# Patient Record
Sex: Male | Born: 1960 | Race: White | Hispanic: No | Marital: Married | Smoking: Never smoker
Health system: Southern US, Community
[De-identification: ages and names within clinical notes are randomized; demographics above are authoritative.]

## PROBLEM LIST (undated history)

## (undated) DIAGNOSIS — C189 Malignant neoplasm of colon, unspecified: Secondary | ICD-10-CM

## (undated) DIAGNOSIS — E785 Hyperlipidemia, unspecified: Secondary | ICD-10-CM

## (undated) DIAGNOSIS — Z87442 Personal history of urinary calculi: Secondary | ICD-10-CM

## (undated) DIAGNOSIS — Z9229 Personal history of other drug therapy: Secondary | ICD-10-CM

## (undated) DIAGNOSIS — M1990 Unspecified osteoarthritis, unspecified site: Secondary | ICD-10-CM

## (undated) DIAGNOSIS — E119 Type 2 diabetes mellitus without complications: Secondary | ICD-10-CM

## (undated) DIAGNOSIS — IMO0001 Reserved for inherently not codable concepts without codable children: Secondary | ICD-10-CM

## (undated) DIAGNOSIS — K589 Irritable bowel syndrome without diarrhea: Secondary | ICD-10-CM

## (undated) DIAGNOSIS — I10 Essential (primary) hypertension: Secondary | ICD-10-CM

## (undated) DIAGNOSIS — I4891 Unspecified atrial fibrillation: Secondary | ICD-10-CM

## (undated) DIAGNOSIS — F419 Anxiety disorder, unspecified: Secondary | ICD-10-CM

## (undated) DIAGNOSIS — K219 Gastro-esophageal reflux disease without esophagitis: Secondary | ICD-10-CM

## (undated) HISTORY — DX: Unspecified atrial fibrillation: I48.91

## (undated) HISTORY — PX: KNEE ARTHROSCOPY: SUR90

## (undated) HISTORY — DX: Irritable bowel syndrome, unspecified: K58.9

## (undated) HISTORY — DX: Hyperlipidemia, unspecified: E78.5

## (undated) HISTORY — PX: FOOT SURGERY: SHX648

## (undated) HISTORY — PX: LITHOTRIPSY: SUR834

## (undated) HISTORY — DX: Reserved for inherently not codable concepts without codable children: IMO0001

## (undated) HISTORY — DX: Personal history of other drug therapy: Z92.29

## (undated) HISTORY — DX: Essential (primary) hypertension: I10

## (undated) HISTORY — PX: EYE SURGERY: SHX253

## (undated) HISTORY — DX: Anxiety disorder, unspecified: F41.9

## (undated) HISTORY — PX: VASECTOMY: SHX75

## (undated) SURGERY — CARDIOVERSION
Anesthesia: Monitor Anesthesia Care

---

## 1994-01-22 DIAGNOSIS — I499 Cardiac arrhythmia, unspecified: Secondary | ICD-10-CM

## 1994-01-22 HISTORY — DX: Cardiac arrhythmia, unspecified: I49.9

## 2000-08-02 ENCOUNTER — Ambulatory Visit (HOSPITAL_COMMUNITY): Admission: RE | Admit: 2000-08-02 | Discharge: 2000-08-02 | Payer: Self-pay | Admitting: Internal Medicine

## 2002-09-06 ENCOUNTER — Encounter: Payer: Self-pay | Admitting: Cardiology

## 2007-02-10 ENCOUNTER — Ambulatory Visit: Payer: Self-pay | Admitting: Cardiology

## 2007-02-14 ENCOUNTER — Ambulatory Visit: Payer: Self-pay | Admitting: Cardiology

## 2007-02-20 ENCOUNTER — Ambulatory Visit: Payer: Self-pay | Admitting: Cardiology

## 2007-02-28 ENCOUNTER — Ambulatory Visit: Payer: Self-pay | Admitting: Cardiology

## 2007-03-04 ENCOUNTER — Ambulatory Visit: Payer: Self-pay | Admitting: Cardiology

## 2007-03-12 ENCOUNTER — Ambulatory Visit: Payer: Self-pay | Admitting: Cardiology

## 2007-03-19 ENCOUNTER — Ambulatory Visit: Payer: Self-pay | Admitting: Cardiology

## 2007-03-25 ENCOUNTER — Ambulatory Visit: Payer: Self-pay | Admitting: Cardiology

## 2007-04-04 ENCOUNTER — Ambulatory Visit: Payer: Self-pay | Admitting: Cardiology

## 2007-06-03 ENCOUNTER — Ambulatory Visit: Payer: Self-pay | Admitting: Cardiology

## 2007-11-14 ENCOUNTER — Ambulatory Visit: Payer: Self-pay | Admitting: Cardiology

## 2008-01-28 ENCOUNTER — Ambulatory Visit: Payer: Self-pay | Admitting: Cardiology

## 2008-02-03 ENCOUNTER — Ambulatory Visit: Payer: Self-pay | Admitting: Cardiology

## 2008-02-13 ENCOUNTER — Ambulatory Visit: Payer: Self-pay | Admitting: Cardiology

## 2008-02-20 ENCOUNTER — Ambulatory Visit: Payer: Self-pay | Admitting: Cardiology

## 2008-03-03 ENCOUNTER — Ambulatory Visit: Payer: Self-pay | Admitting: Cardiology

## 2008-03-12 ENCOUNTER — Ambulatory Visit: Payer: Self-pay | Admitting: Cardiology

## 2008-03-19 ENCOUNTER — Ambulatory Visit: Payer: Self-pay | Admitting: Cardiology

## 2008-03-26 ENCOUNTER — Ambulatory Visit: Payer: Self-pay | Admitting: Cardiology

## 2008-04-01 ENCOUNTER — Ambulatory Visit: Payer: Self-pay | Admitting: Cardiology

## 2008-04-06 ENCOUNTER — Ambulatory Visit: Payer: Self-pay | Admitting: Cardiology

## 2008-05-21 ENCOUNTER — Ambulatory Visit: Payer: Self-pay | Admitting: Cardiology

## 2008-06-28 ENCOUNTER — Ambulatory Visit: Payer: Self-pay | Admitting: Cardiology

## 2008-07-01 ENCOUNTER — Ambulatory Visit: Payer: Self-pay | Admitting: Cardiology

## 2008-07-07 ENCOUNTER — Encounter: Payer: Self-pay | Admitting: Cardiology

## 2008-09-06 ENCOUNTER — Encounter: Payer: Self-pay | Admitting: *Deleted

## 2009-01-25 ENCOUNTER — Encounter: Payer: Self-pay | Admitting: Cardiology

## 2009-04-01 ENCOUNTER — Encounter: Payer: Self-pay | Admitting: Cardiology

## 2009-05-01 ENCOUNTER — Encounter: Payer: Self-pay | Admitting: Cardiology

## 2009-05-02 ENCOUNTER — Encounter: Payer: Self-pay | Admitting: Cardiology

## 2009-05-02 ENCOUNTER — Ambulatory Visit: Payer: Self-pay | Admitting: Cardiology

## 2009-05-03 ENCOUNTER — Encounter: Payer: Self-pay | Admitting: Cardiology

## 2009-05-04 ENCOUNTER — Encounter: Payer: Self-pay | Admitting: Cardiology

## 2009-05-05 ENCOUNTER — Encounter: Payer: Self-pay | Admitting: Cardiology

## 2009-05-12 ENCOUNTER — Ambulatory Visit (HOSPITAL_COMMUNITY): Admission: RE | Admit: 2009-05-12 | Discharge: 2009-05-12 | Payer: Self-pay | Admitting: Urology

## 2009-05-30 ENCOUNTER — Encounter: Payer: Self-pay | Admitting: Cardiology

## 2010-02-21 NOTE — Letter (Signed)
Summary: Letter/ [facility]  Letter/ [facility]   Imported By: Dorise Hiss 05/05/2009 08:34:46  _____________________________________________________________________  External Attachment:    Type:   Image     Comment:   External Document  Appended Document: Letter/ [facility] patient can be cleared for surgery. No further cardiac workup needed. The patient at very low risk for cardiac complications.  Appended Document: Letter/ [facility] Note faxed back to [facility].

## 2010-02-21 NOTE — Letter (Signed)
Summary: Appointment -missed  [facility] at [facility] [address]   [place], [place] [address]   Phone: [phone]  Fax: [phone]     May 30, 2009 MRN: [id number]     Bradley Rodriguez [address] [place], [place]  [address]     Dear Mr. NAZAR,  Our records indicate you missed your appointment on May 30, 2009                        with Dr.   Andee Lineman.   It is very important that we reach you to reschedule this appointment. We look forward to participating in your health care needs.   Please contact us at the number listed above at your earliest convenience to reschedule this appointment.   Sincerely,    Glass blower/designer

## 2010-02-21 NOTE — Letter (Signed)
Summary: External Correspondence/ [facility]  External Correspondence/ [facility]   Imported By: Dorise Hiss 05/04/2009 15:01:37  _____________________________________________________________________  External Attachment:    Type:   Image     Comment:   External Document

## 2010-02-21 NOTE — Procedures (Signed)
Summary: Holter and Event/ [facility]  Holter and Event/ [facility]   Imported By: Dorise Hiss 05/09/2009 12:20:14  _____________________________________________________________________  External Attachment:    Type:   Image     Comment:   External Document

## 2010-02-21 NOTE — Letter (Signed)
Summary: [facility] D/C DR.TERRY DANIEL  [facility] D/C DR.TERRY DANIEL   Imported By: Zachary George 05/30/2009 12:03:20  _____________________________________________________________________  External Attachment:    Type:   Image     Comment:   External Document

## 2010-02-21 NOTE — Letter (Signed)
Summary: Letter/ [facility]  [facility]/ [facility]   Imported By: Dorise Hiss 05/16/2009 10:52:17  _____________________________________________________________________  External Attachment:    Type:   Image     Comment:   External Document

## 2010-02-21 NOTE — Consult Note (Signed)
Summary: CARDIOLOGY CONSULT/ [facility]  CARDIOLOGY CONSULT/ [facility]   Imported By: Zachary George 05/30/2009 11:55:47  _____________________________________________________________________  External Attachment:    Type:   Image     Comment:   External Document

## 2010-06-06 NOTE — Assessment & Plan Note (Signed)
[facility]                          [facility] NOTE   NAME:Bradley Rodriguez, Bradley Rodriguez                        MRN:          [id number]  DATE:03/19/2008                            DOB:          1960/09/18    HISTORY OF PRESENT ILLNESS:  The patient is a 50 year old male with a  history of paroxysmal atrial fibrillation.  He has been cardioverted  several occasions in the past.  He is now back in atrial fibrillation.  He is feeling tired and fatigued with this.  He has now also poor rate  of control as he ran out of Cardizem.  He is not able to afford this.  The patient denies having any chest pain or shortness of breath.   MEDICATIONS:  1. Benicar 40 mg a day.  2. Lorazepam p.o. t.i.d.  3. Metoprolol tartrate 50 mg p.o. t.i.d.  4. Coumadin as directed.  The patient has been therapeutic for 4      weeks.  5. The patient again did not take Cardizem.   PHYSICAL EXAMINATION:  VITAL SIGNS:  Blood pressure 141/89, heart rate  119, and weight 328 pounds.  LUNGS:  Clear.  HEART:  Regular rate and rhythm.  Normal S1 and S2.  ABDOMEN:  Soft.  EXTREMITIES:  No cyanosis, clubbing, or edema.   PROBLEM LIST:  1. Atrial fibrillation.  2. Awaiting cardioversion.  3. History of anxiety.  4. History of irritable bowel syndrome.  5. History of hypertension controlled.   PLAN:  1. The patient will be set up for cardioversion in next week.  2. If the patient reverse back to atrial fibrillation, we will try to      use a pill-in-the-pocket approach in the future.  3. The patient can follow up with me next week for scheduled      cardioversion.     Learta Codding, MD,FACC     GED/MedQ  DD: 03/19/2008  DT: 03/20/2008  Job #: [id number]

## 2010-06-06 NOTE — Assessment & Plan Note (Signed)
[facility]                          [facility] NOTE   NAME:Bradley Rodriguez, Bradley Rodriguez                        MRN:          [id number]  DATE:03/04/2007                            DOB:          28-Jun-1960    [name] is seen for followup.  See my note of February 10, 2007.  He  remains in atrial fib.  He is being coumadinized.  When I saw him last,  we check his renal function, which is normal and his TSH which is  normal.  We are very reviewing his Coumadin treatment very carefully to  be sure that he is a therapeutic.  We will then proceed with  cardioversion as soon as possible.   PAST MEDICAL HISTORY:   ALLERGIES:  LISINOPRIL (COUGH) AND QUININE.   MEDICATIONS:  1. Aspirin.  2. Metoprolol 100 in the morning, 50 in the evening.  3. Benicar 40.  4. Pristiq 50.  5. Cardizem 180 b.i.d.  6. Coumadin as directed.   OTHER MEDICAL PROBLEMS:  See the list on the note of February 10, 2007.   REVIEW OF SYSTEMS:  He is feeling well.   PHYSICAL EXAM:  Blood pressure is 119/72 with a pulse of 59.  The  patient is oriented to person, time and place.  Affect is normal.  HEENT:  Reveals no xanthelasma.  He has normal extraocular motion.  There are no carotid bruits.  There is no jugular venous distention.  Lungs are clear.  Respiratory effort is not labored.  Cardiac exam reveals S1-S2.  The rhythm is irregularly irregular.  His abdomen is soft.  He has no peripheral edema.   PROBLEMS INCLUDE:  1. History of hypertension.  2. History of some anxiety, under control.  3. Hyperlipidemia.  4. Irritable bowel.  5. Uric acid kidney stone.  6. Question of Gilbert's syndrome.  7. Paroxysmal atrial fibrillation.  The patient had atrial fib in the      past.  He was given ibutilide and stayed in atrial fib. He was then      cardioverted as it was done within 24 hours and this was in 2004.      He received 150 joules using the biphasic defibrillator and he    converted to sinus.  His rhythm then returned  to atrial fib on      January 10, 2007.  He did have an upper respiratory tract      infection at that time.  His [place] score is only 1.  I did decide to      coumadinized him however prior to his cardioversion.  There is      history of and the patient probably having had an echo sometime in      the remote past.  He has no insurance and I am trying to do      everything I can to limit his expenses.  However, I am      uncomfortable proceeding with cardioversion without some echo data.      We will obtain the least expensive echo that we  can prior to the      cardioversion to be sure things are safe.     Luis Abed, MD, [facility]  Electronically Signed    JDK/MedQ  DD: 03/04/2007  DT: 03/05/2007  Job #: [id number]   cc:   Donzetta Sprung

## 2010-06-06 NOTE — Assessment & Plan Note (Signed)
[facility]                          [facility]   NAME:Bradley Rodriguez, Bradley Rodriguez                        MRN:          [id number]  DATE:01/28/2008                            DOB:          1960/06/15    HISTORY OF PRESENT ILLNESS:  Bradley Rodriguez is a very pleasant 50 year old  male with a history of paroxysmal atrial fibrillation.  The patient  noticed on January 14, 2008, that he developed recurrent atrial  fibrillation.  In reviewing his chart, it appears that he has been  approximately 3-4 times cardioverted over the last 12 years starting  back in 1996.  He is not on chronic antiarrhythmic drug therapy but Dr.  Reuel Boom did increase his AV nodal blocking agents in order to improve  rate control.  The patient states that he feels tired and fatigued.  He  is not certain whether it is related to his medications or whether being  in atrial fibrillation is contributing to this.  He is also concerned  about the risk of stroke, but I have told him that his CHADS score is  only 1 and certainly aspirin long-term is a good therapy for him.  We  had a long discussion about the various strategies and how to treat him  and keep him in normal sinus rhythm, which clearly is beneficial to him  with an improved quality of life.   MEDICATIONS:  1. Aspirin 81 mg p.o. daily.  2. Pristiq 50 mg p.o. daily.  3. Benicar 40 mg p.o. daily.  4. Lorazepam 0.5 mg one tablet p.o. t.i.d.  5. Digoxin 0.5 mg p.o. q.a.m.  6. Metoprolol tartrate 50 mg one tablet p.o. t.i.d.  7. Cardizem 120 mg p.o. daily.   PHYSICAL EXAMINATION:  VITAL SIGNS:  Blood pressure is 126/79, that is  with a large cuff, heart rate on EKG 88 beats per minute.  Weight is 327  pounds, the patient is 6 feet 8 inches.  GENERAL:  The patient is well-nourished, but in no distress.  LUNGS:  Clear.  HEART:  Regular rate and rhythm with normal S1 and S2.  No pathological  murmurs.  ABDOMEN:  Soft.  EXTREMITIES:  No edema.   PROBLEM:  1. Paroxysmal atrial fibrillation.  2. History of anxiety.  3. Irritable bowel syndrome.  4. History of hypertension, although blood pressure is well      controlled.   PLAN:  1. I had a long discussion with the patient and I feel the best      approach at this point in time is to put him back on Coumadin and      cardiovert him in 4 weeks.  At that point in time, we will continue      4 weeks of Coumadin and must initiate antiarrhythmic drug therapy      immediately.  2. If the patient has recurrence of atrial fibrillation after his      cardioversion, he will come immediately to the emergency room for      chemical cardioversion with a pill-in-the-pocket approach with 300  mg of flecainide.  It is preferable giving him chronic therapy with      flecainide as his episodes are rather infrequent.  I also will not      use the flecainide initially because he has now been several weeks      in atrial fibrillation and I do think an electrocardioversion is      the preferred strategy.  3. We also called Dr. Graciela Husbands and we had him on the speaker phone.  We      discussed the possibility of pulmonary vein isolation, but we both      agreed that this is certainly not first-line treatment and that the      patient has not shown to fail antiarrhythmic drug therapy and his      atrial fibrillation is rather infrequent.     Learta Codding, MD,FACC  Electronically Signed    GED/MedQ  DD: 01/28/2008  DT: 01/29/2008  Job #: [id number]   cc:   Donzetta Sprung

## 2010-06-06 NOTE — Assessment & Plan Note (Signed)
[facility]                          [facility]   NAME:Bradley Rodriguez, Bradley Rodriguez                        MRN:          [id number]  DATE:04/04/2007                            DOB:          21-Jul-1960    Bradley Rodriguez is seen for follow-up.  We proceeded with a cardioversion on  March 25, 2007.  He received 100 joules and remained in atrial  fibrillation, followed by 200 joules and he converted to sinus.  He has  remained in sinus.  His EKG today shows normal sinus rhythm.   He is feeling much better.  He probably feels a rare post PACB.  He is  fully active.   PAST MEDICAL HISTORY:   ALLERGIES:  Lisinopril and quinine.   MEDICATIONS:  Aspirin, metoprolol 100 morning 50 in the evening (to be  reduced to 50 in the morning and 50 in the evening, Pristia, Coumadin,  benazepril, and Cardizem 180 b.i.d.   OTHER MEDICAL PROBLEMS:  See the list on my note of March 04, 2007.   REVIEW OF SYSTEMS:  He feels great.  His review of systems otherwise is  negative.   PHYSICAL EXAM:  Blood pressure is 140/78 with a pulse of 60.  The  patient is oriented to person, time and place.  Affect is normal.  HEENT:  Reveals no xanthelasma.  He has normal extraocular motion.  NECK:  There are no carotid bruits.  There is no jugular venous  distention.  LUNGS are clear.  Respiratory effort is not labored.  CARDIAC exam reveals S1-S2.  There are no clicks or significant murmurs.  ABDOMEN is soft.  He has no peripheral edema.   EKG reveals normal sinus rhythm.   Mr. Norden is feeling well.  We will reduce his beta blocker dose as he  feels fatigued at times.  He is holding sinus rhythm.  He does not  require long-term Coumadin.  Therefore his Coumadin will be stopped 4  weeks after his cardioversion.  He is quite stable.  I will see him back  in 8 weeks.     Luis Abed, MD, [facility]  Electronically Signed    JDK/MedQ  DD: 04/04/2007  DT: 04/05/2007  Job #:  [id number]   cc:   Donzetta Sprung

## 2010-06-06 NOTE — Assessment & Plan Note (Signed)
[facility]                          [facility] NOTE   NAME:Volkman, ALICE VITELLI                        MRN:          [id number]  DATE:02/10/2007                            DOB:          07-07-1960    HISTORY:  Mr. Hardenbrook is a very pleasant gentleman who has recurrent  atrial fibrillation.  He is here with his wife today.  The patient has  had atrial fibrillation in the past.  In 2001 I had seen him.  He also  was seen by Dr. Graciela Husbands.  It was felt that he had paroxysmal atrial  fibrillation and a history of hypertension.  His left atrium was 4.6.  Plans were made at that time for cardioversion.  In 2004 he presented  with recurrent atrial fibrillation.  He was given ibutilide.  He stayed  in atrial fibrillation and he was admitted.  At that time he was seen  also by Dr. Diona Browner.  He was less than 24 hours.  On this basis it was  felt that he could be cardioverted.  This was done on April 21, 2002.  The patient then received 150 joules using a biphasic defibrillator and  he converted to sinus rhythm.  He has been stable since 2004.   Most recently on January 10, 2007, he developed atrial fib again.  He  did have an upper respiratory tract infection at that time but he was  not using any significant catecholamines.  He has felt this since that  time.  He has seen Dr. Reuel Boom.  His meds have been adjusted up.  He is  still having increased heart rate.  He is not having any syncope or  presyncope.  He is not on Coumadin.  Coumadin has been discussed in the  past but it is felt that he has not had an absolute indication.  Criteria have changed over time and with hypertension his [place] score is  only 1 which does not demand chronic coumadinization.  By history there  is probably an echo in the past although I do not have that data at this  time.   ALLERGIES:  LISINOPRIL (cough), QUININE or QUINIDINE.   MEDICATIONS:  1. Aspirin 81.  2. Metoprolol 100  in the morning and 50 in the evening.  3. Benicar 40.  4. Pristiq (for anxiety).  5. Cardizem 180 daily.   PAST MEDICAL HISTORY:  Other medical problems, see the list below.   REVIEW OF SYSTEMS:  He is actually feeling well today other than knowing  that if he increases his activity his heart rate increases.   PHYSICAL EXAM:  VITAL SIGNS:  Blood pressure is 154/84 with a pulse of  110 today.  His weight is 317 pounds.  GENERAL:  The patient is oriented to person, time and place.  Affect is  normal.  He is here with his wife in the room today.  HEENT:  Reveals no xanthelasma.  He has normal extraocular motion.  NECK:  There are no carotid bruits.  There is no jugular venous  distention.  LUNGS:  Lungs  are clear.  Respiratory effort is not labored.  CARDIOVASCULAR:  Cardiac exam reveals an S1 with an S2.  There are no  clicks or significant murmurs.  ABDOMEN:  The abdomen is obese but soft.  EXTREMITIES:  He has no peripheral edema.  There are no musculoskeletal  deformities.   EKG reveals atrial fibrillation with a rate of 125.  This is a coarse  atrial fibrillation.  I do not think that this can be called flutter but  it will have to be kept in mind.   PROBLEMS INCLUDE:  1. History of hypertension.  2. History of some anxiety which is under control  3. History of hyperlipidemia.  I do not see that he is on any      cholesterol meds at this time.  4. History of irritable bowel.  5. History of a uric acid kidney stone in the past.  6. Question of history of Gilbert's syndrome.  7. Paroxysmal atrial fibrillation.   Coumadin has been discussed in the past.  By current [place] score he has a  score of only 1.  However, he has been in atrial fibrillation now for 1  month.  He is symptomatic.  It is appropriate to cardiovert him and to  use Coumadin in the meantime.  The plan will be to increase his Cardizem  to 180 b.i.d. and to keep him on the same dose of metoprolol.  He needs  a  BMET, CBC and TSH.  We will try to obtain old echo data and not push  for repeat echo if we can hold off.  Unfortunately he has no insurance  at this time.  We will try to help every way possible.  We will start by  getting him coumadinized and obtaining labs and then see him back for  followup.     Luis Abed, MD, [facility]  Electronically Signed    JDK/MedQ  DD: 02/10/2007  DT: 02/10/2007  Job #: [id number]   cc:   Donzetta Sprung

## 2010-06-06 NOTE — Assessment & Plan Note (Signed)
 HEALTHC[facility]                          [facility] NOTE   NAME:Maish, WALDEN STATZ                        MRN:          [id number]  DATE:06/03/2007                            DOB:          March 01, 1960    HISTORY OF PRESENT ILLNESS:  Mr. Blew returns for follow-up, and  fortunately on physical exam he is holding sinus rhythm.  He feels well  in general.  His Coumadin has now been stopped after his cardioversion.  He feels some fatigue during the day.  I will be decreasing his  metoprolol dose.  Otherwise, he is doing well.   PAST MEDICAL HISTORY:   ALLERGIES:  He had a cough with lisinopril and some type of flu symptoms  with quinine.   MEDICATIONS:  1. Aspirin.  2. __________  3. Cardizem 120 in the morning and 120 in the evening.  When I see him      next, we will try to get this to once a day.  4. Metoprolol tartrate 100 mg in the morning (to be reduced to 50 mg      in the morning).   OTHER MEDICAL PROBLEMS:  See the list on my note of March 04, 2007.   REVIEW OF SYSTEMS:  Other than some fatigue, his review of systems is  negative.   PHYSICAL EXAMINATION:  VITAL SIGNS:  Blood pressure today is 136/89 with  a pulse of 68, weight is 321 pounds.  The patient is oriented to person,  time and place.  Affect is normal.  HEENT:  Reveals no xanthelasma.  He has normal extraocular motion.  There are no carotid bruits.  There is no jugular venous distention.  LUNGS:  Clear.  CARDIAC:  Exam reveals S1-S2.  There are no clicks or significant  murmurs.  He has no significant peripheral edema.   PROBLEMS:  Listed on my note of March 04, 2007 and reviewed  completely.  #1.  History of hypertension.  I need to decrease his metoprolol.  When  I see him back, will make a final decision about his blood pressure  medications and have him return completely to Dr. Reuel Boom.  #7.  Paroxysmal atrial fibrillation.  Fortunately, he converted to sinus  rhythm with his cardioversion and he is holding sinus.  His [place] score  is low and he is off Coumadin.   I will see him back for one last follow-up and then return him  completely to Dr. Reuel Boom.     Luis Abed, MD, [facility]  Electronically Signed    JDK/MedQ  DD: 06/03/2007  DT: 06/03/2007  Job #: [id number]   cc:   Donzetta Sprung

## 2010-06-06 NOTE — Assessment & Plan Note (Signed)
[facility]                          [facility]   NAME:Bradley Rodriguez, Bradley Rodriguez                        MRN:          [id number]  DATE:11/14/2007                            DOB:          Nov 15, 1960    Bradley Rodriguez is doing well.  He had had atrial fibrillation.  Ultimately,  he was cardioverted and he is held sinus rhythm.  His Coumadin has been  stopped.  He has hypertension, has a risk factor, but no other risk  factors and his CHADS score is therefore low.  He is going about full  activities.  He mentions that when he went into atrial fibrillation  originally he had had something to drink at a friend's house that was  probably  a home brew.  The patient does not drink excessive alcohol  and he did not have a large amount that day.  However, he thinks that  day he had his atrial fib.  Overall, however, he is quite stable and  doing very well.   PAST MEDICAL HISTORY:   ALLERGIES:  There was a cough from LISINOPRIL and there was problem with  QUININE in the past.  He tolerates an ARB.   MEDICATIONS:  Aspirin, Pristiq, metoprolol, and Benicar.   OTHER MEDICAL PROBLEMS:  See the list on my note of March 04, 2007.   REVIEW OF SYSTEMS:  He is feeling well and doing well.  He has no GI or  GU symptoms.  His review of systems otherwise is negative.   PHYSICAL EXAMINATION:  Blood pressure with a large cuff is 123/80, pulse  is 68.  The patient is oriented to person, time, and place.  Affect is normal.  HEENT:  No xanthelasma.  He has normal extraocular motion.  There are no  carotid bruits.  There is no jugular venous distention.  LUNGS:  Clear.  Respiratory effort is nonlabored.  CARDIAC:  S1 and S2.  There are no clicks or significant murmurs.  ABDOMEN:  Soft.  There is no peripheral edema.  The rhythm is regular.   No labs were done today.   Mr. Cowie is stable.   PROBLEMS:  1. Hypertension.  On medications and when checked with a large  cuff      his blood pressure is in a good range.  2. History of some anxiety, under control.  3. Hyperlipidemia.  4. Irritable bowel.  5. History of uric acid stone.  6. Question of Guillain-Barre syndrome.  7. Paroxysmal atrial fibrillation.  He is now in sinus and holding      sinus.  No other treatment is      needed.  He does not need Coumadin.  He is on low-dose aspirin.  I      will see him back as needed.     Luis Abed, MD, [facility]  Electronically Signed    JDK/MedQ  DD: 11/14/2007  DT: 11/15/2007  Job #: [id number]   cc:   Donzetta Sprung

## 2010-06-16 DIAGNOSIS — I4891 Unspecified atrial fibrillation: Secondary | ICD-10-CM

## 2010-07-03 ENCOUNTER — Encounter: Payer: Self-pay | Admitting: Cardiology

## 2011-08-10 ENCOUNTER — Encounter: Payer: Self-pay | Admitting: Cardiology

## 2012-04-07 ENCOUNTER — Other Ambulatory Visit: Payer: Self-pay | Admitting: Cardiology

## 2012-04-10 ENCOUNTER — Other Ambulatory Visit: Payer: Self-pay | Admitting: Cardiology

## 2012-10-10 ENCOUNTER — Encounter: Payer: Self-pay | Admitting: Cardiology

## 2012-10-14 ENCOUNTER — Encounter: Payer: Self-pay | Admitting: *Deleted

## 2012-10-14 ENCOUNTER — Other Ambulatory Visit: Payer: Self-pay | Admitting: Cardiology

## 2012-10-14 ENCOUNTER — Telehealth: Payer: Self-pay | Admitting: Cardiology

## 2012-10-14 ENCOUNTER — Ambulatory Visit (INDEPENDENT_AMBULATORY_CARE_PROVIDER_SITE_OTHER): Payer: BC Managed Care – PPO | Admitting: Cardiology

## 2012-10-14 ENCOUNTER — Encounter: Payer: Self-pay | Admitting: Cardiology

## 2012-10-14 VITALS — BP 115/78 | HR 68 | Ht >= 80 in | Wt 335.0 lb

## 2012-10-14 DIAGNOSIS — F419 Anxiety disorder, unspecified: Secondary | ICD-10-CM | POA: Insufficient documentation

## 2012-10-14 DIAGNOSIS — K589 Irritable bowel syndrome without diarrhea: Secondary | ICD-10-CM | POA: Insufficient documentation

## 2012-10-14 DIAGNOSIS — I4891 Unspecified atrial fibrillation: Secondary | ICD-10-CM

## 2012-10-14 DIAGNOSIS — I4819 Other persistent atrial fibrillation: Secondary | ICD-10-CM | POA: Insufficient documentation

## 2012-10-14 DIAGNOSIS — E785 Hyperlipidemia, unspecified: Secondary | ICD-10-CM | POA: Insufficient documentation

## 2012-10-14 DIAGNOSIS — Z9852 Vasectomy status: Secondary | ICD-10-CM | POA: Insufficient documentation

## 2012-10-14 NOTE — Patient Instructions (Addendum)
Your physician recommends that you schedule a follow-up appointment in: after cardioversion. Your physician recommends that you continue on your current medications as directed. Please refer to the Current Medication list given to you today. Your physician has recommended that you have a Cardioversion (DCCV). Electrical Cardioversion uses a jolt of electricity to your heart either through paddles or wired patches attached to your chest. This is a controlled, usually prescheduled, procedure. Defibrillation is done under light anesthesia in the hospital, and you usually go home the day of the procedure. This is done to get your heart back into a normal rhythm. You are not awake for the procedure. Please see the instruction sheet given to you today.

## 2012-10-14 NOTE — Telephone Encounter (Signed)
DCCV at [facility] 10/15/12 @9 :00 am with Dr. Shirlee Latch

## 2012-10-14 NOTE — Progress Notes (Signed)
HPI  Patient is seen today post hospitalization for atrial fibrillation. The patient has paroxysmal atrial fibrillation. He knows exactly when his symptoms started. He has frequently been cardioverted in the emergency room over the years. Most recently on October 10, 2012 he went into the emergency room at [facility]. His heart rate was reduced with IV Cardizem. Anticoagulation was started immediately and he has been on Eliquis twice a day since then.  I have never seen this patient in the office before. I was involved on a prior occasion of cardioverting him at [facility]. I have reviewed the recent hospital information. I have also reviewed and updated the prior medical record. He has normal left ventricular function by history. He feels poorly in atrial fibrillation. He is at risk score is 0.  Not on File  Current Outpatient Prescriptions  Medication Sig Dispense Refill  . apixaban (ELIQUIS) 5 MG TABS tablet Take 5 mg by mouth 2 (two) times daily.      . chlorthalidone (HYGROTON) 25 MG tablet Take 25 mg by mouth daily.      Marland Kitchen desvenlafaxine (PRISTIQ) 50 MG 24 hr tablet Take 50 mg by mouth daily.      Marland Kitchen diltiazem (DILACOR XR) 240 MG 24 hr capsule Take 240 mg by mouth daily.      Marland Kitchen LORazepam (ATIVAN) 0.5 MG tablet Take 0.5 mg by mouth as needed for anxiety.      Marland Kitchen losartan (COZAAR) 100 MG tablet Take 100 mg by mouth daily.      . magnesium oxide (MAG-OX) 400 MG tablet Take 400 mg by mouth daily.      . metoprolol (LOPRESSOR) 50 MG tablet Take 50 mg by mouth 2 (two) times daily.       . potassium chloride SA (K-DUR,KLOR-CON) 20 MEQ tablet Take 20 mEq by mouth 2 (two) times daily.       No current facility-administered medications for this visit.    History   Social History  . Marital Status: Married    Spouse Name: N/A    Number of Children: N/A  . Years of Education: N/A   Occupational History  . Not on file.   Social History Main Topics  . Smoking status: Never  Smoker   . Smokeless tobacco: Not on file  . Alcohol Use: No  . Drug Use: No  . Sexual Activity:    Other Topics Concern  . Not on file   Social History Narrative  . No narrative on file    Family History  Problem Relation Age of Onset  . Hypertension      Past Medical History  Diagnosis Date  . S/P vasectomy   . A-fib     paroxysmal afib sucessfully cardioverted on 03/25/07  . Hypertension   . Hyperlipidemia   . Anxiety   . IBS (irritable bowel syndrome)     Past Surgical History  Procedure Laterality Date  . Lithotripsy      for kidney stones 2002    Patient Active Problem List   Diagnosis Date Noted  . S/P vasectomy   . A-fib   . Hyperlipidemia   . Anxiety   . IBS (irritable bowel syndrome)     ROS  Patient denies fever, chills, headache, sweats, rash, change in vision, change in hearing, chest pain, cough, nausea vomiting, urinary symptoms. All other systems are reviewed and are negative.    PHYSICAL EXAM  Patient is oriented to person time and place. Affect  is normal. There is no jugulovenous distention. Lungs are clear. Respiratory effort is nonlabored. Cardiac exam reveals S1 and S2. There no clicks or significant murmurs. Abdomen is soft. There is no peripheral edema. There no musculoskeletal deformities. There are no skin rashes.  Filed Vitals:   10/14/12 0839  BP: 115/78  Pulse: 68  Height: 6\' 8"  (2.032 m)  Weight: 335 lb (151.955 kg)   EKG is done today and reviewed by me. The QRS is normal. The rhythm is atrial fibrillation with controlled rate.  ASSESSMENT & PLAN

## 2012-10-14 NOTE — Telephone Encounter (Signed)
Pt has [employer] of [place].  No precert required for cardioversion

## 2012-10-14 NOTE — Assessment & Plan Note (Signed)
The patient has return of his atrial fibrillation. Historically he has done well with intermittent cardioversions. He is fully anticoagulated. His risk score is 0. However arrange for cardioversion tomorrow.  As part of today's evaluation I spent greater than 25 minutes with his total care. More than half of this time has been working with him and talking to him about the planning of his cardioversion and making decisions about this.

## 2012-10-15 ENCOUNTER — Encounter (HOSPITAL_COMMUNITY): Payer: Self-pay | Admitting: Certified Registered"

## 2012-10-15 ENCOUNTER — Ambulatory Visit (HOSPITAL_COMMUNITY): Payer: BC Managed Care – PPO | Admitting: Certified Registered"

## 2012-10-15 ENCOUNTER — Encounter (HOSPITAL_COMMUNITY): Payer: Self-pay | Admitting: *Deleted

## 2012-10-15 ENCOUNTER — Encounter (HOSPITAL_COMMUNITY)
Admission: RE | Disposition: A | Discharge status: Home / Self Care | Payer: Self-pay | Source: Ambulatory Visit | Attending: Cardiology

## 2012-10-15 ENCOUNTER — Ambulatory Visit (HOSPITAL_COMMUNITY)
Admission: RE | Admit: 2012-10-15 | Discharge: 2012-10-15 | Disposition: A | Discharge status: Home / Self Care | Payer: BC Managed Care – PPO | Source: Ambulatory Visit | Attending: Cardiology | Admitting: Cardiology

## 2012-10-15 DIAGNOSIS — Z79899 Other long term (current) drug therapy: Secondary | ICD-10-CM | POA: Insufficient documentation

## 2012-10-15 DIAGNOSIS — I4891 Unspecified atrial fibrillation: Secondary | ICD-10-CM | POA: Insufficient documentation

## 2012-10-15 DIAGNOSIS — I10 Essential (primary) hypertension: Secondary | ICD-10-CM | POA: Insufficient documentation

## 2012-10-15 HISTORY — PX: CARDIOVERSION: SHX1299

## 2012-10-15 SURGERY — CARDIOVERSION
Anesthesia: Monitor Anesthesia Care

## 2012-10-15 MED ORDER — SODIUM CHLORIDE 0.9 % IV SOLN
INTRAVENOUS | Status: DC | PRN
  Administered 2012-10-15: 10:00:00 via INTRAVENOUS

## 2012-10-15 MED ORDER — LIDOCAINE HCL (CARDIAC) 20 MG/ML IV SOLN
INTRAVENOUS | Status: DC | PRN
  Administered 2012-10-15: 40 mg via INTRAVENOUS

## 2012-10-15 MED ORDER — PROPOFOL 10 MG/ML IV BOLUS
INTRAVENOUS | Status: DC | PRN
  Administered 2012-10-15: 140 mg via INTRAVENOUS

## 2012-10-15 NOTE — Interval H&P Note (Signed)
History and Physical Interval Note:  10/15/2012 9:24 AM  Bradley Rodriguez  has presented today for surgery, with the diagnosis of AFIB  The various methods of treatment have been discussed with the patient and family. After consideration of risks, benefits and other options for treatment, the patient has consented to  Procedure(s): CARDIOVERSION (N/A) as a surgical intervention .  The patient's history has been reviewed, patient examined, no change in status, stable for surgery.  I have reviewed the patient's chart and labs.  Questions were answered to the patient's satisfaction.     Avedis Bevis [employer]

## 2012-10-15 NOTE — Anesthesia Postprocedure Evaluation (Signed)
  Anesthesia Post-op Note  Patient: Bradley Rodriguez  Procedure(s) Performed: Procedure(s): CARDIOVERSION (N/A)  Patient Location: PACU  Anesthesia Type:General  Level of Consciousness: awake, alert  and oriented  Airway and Oxygen Therapy: Patient Spontanous Breathing  Post-op Pain: none  Post-op Assessment: Patient's Cardiovascular Status Stable  Post-op Vital Signs: stable  Complications: No apparent anesthesia complications

## 2012-10-15 NOTE — Preoperative (Signed)
Beta Blockers   Reason not to administer Beta Blockers:Not Applicable 

## 2012-10-15 NOTE — Anesthesia Preprocedure Evaluation (Addendum)
Anesthesia Evaluation  Patient identified by MRN, date of birth, ID band Patient awake    Reviewed: Allergy & Precautions, H&P , NPO status , Patient's Chart, lab work & pertinent test results, reviewed documented beta blocker date and time   Airway Mallampati: II TM Distance: >3 FB     Dental  (+) Teeth Intact and Dental Advisory Given   Pulmonary neg pulmonary ROS,  breath sounds clear to auscultation        Cardiovascular hypertension, Pt. on medications and Pt. on home beta blockers + dysrhythmias Atrial Fibrillation Rhythm:Irregular Rate:Normal     Neuro/Psych Anxiety Depression negative neurological ROS     GI/Hepatic negative GI ROS, Neg liver ROS,   Endo/Other  negative endocrine ROS  Renal/GU negative Renal ROS     Musculoskeletal negative musculoskeletal ROS (+)   Abdominal (+)  Abdomen: soft. Bowel sounds: normal.  Peds  Hematology negative hematology ROS (+)   Anesthesia Other Findings H/O chronic afib, CV x 6, Pt on eliquis,   Reproductive/Obstetrics negative OB ROS                        Anesthesia Physical Anesthesia Plan  ASA: III  Anesthesia Plan: MAC and General   Post-op Pain Management:    Induction: Intravenous  Airway Management Planned: Mask  Additional Equipment:   Intra-op Plan:   Post-operative Plan:   Informed Consent: I have reviewed the patients History and Physical, chart, labs and discussed the procedure including the risks, benefits and alternatives for the proposed anesthesia with the patient or authorized representative who has indicated his/her understanding and acceptance.   Dental advisory given and History available from chart only  Plan Discussed with: CRNA, Anesthesiologist and Surgeon  Anesthesia Plan Comments:        Anesthesia Quick Evaluation

## 2012-10-15 NOTE — Procedures (Signed)
Electrical Cardioversion Procedure Note Bradley Rodriguez [id number] [birth year]  Procedure: Electrical Cardioversion Indications:  Atrial Fibrillation.  Atrial fibrillation began Friday.  He has been on Eliquis.   Procedure Details Consent: Risks of procedure as well as the alternatives and risks of each were explained to the (patient/caregiver).  Consent for procedure obtained. Time Out: Verified patient identification, verified procedure, site/side was marked, verified correct patient position, special equipment/implants available, medications/allergies/relevent history reviewed, required imaging and test results available.  Performed  Patient placed on cardiac monitor, pulse oximetry, supplemental oxygen as necessary.  Sedation given: Propofol per anesthesiology Pacer pads placed anterior and posterior chest.  Cardioverted 2 time(s). Successful with sternal pressure and 200 J. Cardioverted at 200J.  Evaluation Findings: Post procedure EKG shows: NSR Complications: None Patient did tolerate procedure well.  Would consider atrial fibrillation ablation.  Conversion was more difficult this time, required sternal pressure.   Marca Ancona 10/15/2012, 9:29 AM

## 2012-10-15 NOTE — Transfer of Care (Signed)
Immediate Anesthesia Transfer of Care Note  Patient: Bradley Rodriguez  Procedure(s) Performed: Procedure(s): CARDIOVERSION (N/A)  Patient Location: PACU and Endoscopy Unit  Anesthesia Type:General  Level of Consciousness: awake, alert  and oriented  Airway & Oxygen Therapy: Patient Spontanous Breathing  Post-op Assessment: Report given to PACU RN  Post vital signs: stable  Complications: No apparent anesthesia complications

## 2012-10-15 NOTE — H&P (View-Only) (Signed)
 HPI  Patient is seen today post hospitalization for atrial fibrillation. The patient has paroxysmal atrial fibrillation. He knows exactly when his symptoms started. He has frequently been cardioverted in the emergency room over the years. Most recently on October 10, 2012 he went into the emergency room at [facility]. His heart rate was reduced with IV Cardizem. Anticoagulation was started immediately and he has been on Eliquis twice a day since then.  I have never seen this patient in the office before. I was involved on a prior occasion of cardioverting him at [facility]. I have reviewed the recent hospital information. I have also reviewed and updated the prior medical record. He has normal left ventricular function by history. He feels poorly in atrial fibrillation. He is at risk score is 0.  Not on File  Current Outpatient Prescriptions  Medication Sig Dispense Refill  . apixaban (ELIQUIS) 5 MG TABS tablet Take 5 mg by mouth 2 (two) times daily.      . chlorthalidone (HYGROTON) 25 MG tablet Take 25 mg by mouth daily.      . desvenlafaxine (PRISTIQ) 50 MG 24 hr tablet Take 50 mg by mouth daily.      . diltiazem (DILACOR XR) 240 MG 24 hr capsule Take 240 mg by mouth daily.      . LORazepam (ATIVAN) 0.5 MG tablet Take 0.5 mg by mouth as needed for anxiety.      . losartan (COZAAR) 100 MG tablet Take 100 mg by mouth daily.      . magnesium oxide (MAG-OX) 400 MG tablet Take 400 mg by mouth daily.      . metoprolol (LOPRESSOR) 50 MG tablet Take 50 mg by mouth 2 (two) times daily.       . potassium chloride SA (K-DUR,KLOR-CON) 20 MEQ tablet Take 20 mEq by mouth 2 (two) times daily.       No current facility-administered medications for this visit.    History   Social History  . Marital Status: Married    Spouse Name: N/A    Number of Children: N/A  . Years of Education: N/A   Occupational History  . Not on file.   Social History Main Topics  . Smoking status: Never  Smoker   . Smokeless tobacco: Not on file  . Alcohol Use: No  . Drug Use: No  . Sexual Activity:    Other Topics Concern  . Not on file   Social History Narrative  . No narrative on file    Family History  Problem Relation Age of Onset  . Hypertension      Past Medical History  Diagnosis Date  . S/P vasectomy   . A-fib     paroxysmal afib sucessfully cardioverted on 03/25/07  . Hypertension   . Hyperlipidemia   . Anxiety   . IBS (irritable bowel syndrome)     Past Surgical History  Procedure Laterality Date  . Lithotripsy      for kidney stones 2002    Patient Active Problem List   Diagnosis Date Noted  . S/P vasectomy   . A-fib   . Hyperlipidemia   . Anxiety   . IBS (irritable bowel syndrome)     ROS  Patient denies fever, chills, headache, sweats, rash, change in vision, change in hearing, chest pain, cough, nausea vomiting, urinary symptoms. All other systems are reviewed and are negative.    PHYSICAL EXAM  Patient is oriented to person time and place. Affect   is normal. There is no jugulovenous distention. Lungs are clear. Respiratory effort is nonlabored. Cardiac exam reveals S1 and S2. There no clicks or significant murmurs. Abdomen is soft. There is no peripheral edema. There no musculoskeletal deformities. There are no skin rashes.  Filed Vitals:   10/14/12 0839  BP: 115/78  Pulse: 68  Height: 6' 8" (2.032 m)  Weight: 335 lb (151.955 kg)   EKG is done today and reviewed by me. The QRS is normal. The rhythm is atrial fibrillation with controlled rate.  ASSESSMENT & PLAN  

## 2012-10-16 ENCOUNTER — Encounter (HOSPITAL_COMMUNITY): Payer: Self-pay | Admitting: Cardiology

## 2012-10-21 ENCOUNTER — Telehealth: Payer: Self-pay | Admitting: Cardiology

## 2012-10-21 NOTE — Telephone Encounter (Signed)
Kim with Dr Garner Nash office called.  Dr Reuel Boom wants patient seen to discuss having abliation done

## 2012-10-21 NOTE — Telephone Encounter (Signed)
Patient scheduled for appt with Dr. Myrtis Ser 10/23/12 @9 :15 am. Selena Batten @ Dr. Garner Nash office spoken to and stated that patient was back in A-fib confirmed at office visit there today. Selena Batten states she has sent over records from visit today. Also, left message on patient's voicemail with information about appointment. Patient informed to call office to confirm he received message.

## 2012-10-23 ENCOUNTER — Encounter: Payer: Self-pay | Admitting: Cardiology

## 2012-10-23 ENCOUNTER — Ambulatory Visit (INDEPENDENT_AMBULATORY_CARE_PROVIDER_SITE_OTHER): Payer: BC Managed Care – PPO | Admitting: Cardiology

## 2012-10-23 VITALS — BP 124/72 | HR 53 | Ht >= 80 in | Wt 335.0 lb

## 2012-10-23 DIAGNOSIS — R0989 Other specified symptoms and signs involving the circulatory and respiratory systems: Secondary | ICD-10-CM

## 2012-10-23 DIAGNOSIS — Z136 Encounter for screening for cardiovascular disorders: Secondary | ICD-10-CM

## 2012-10-23 DIAGNOSIS — Z7901 Long term (current) use of anticoagulants: Secondary | ICD-10-CM

## 2012-10-23 DIAGNOSIS — IMO0001 Reserved for inherently not codable concepts without codable children: Secondary | ICD-10-CM | POA: Insufficient documentation

## 2012-10-23 DIAGNOSIS — I4891 Unspecified atrial fibrillation: Secondary | ICD-10-CM

## 2012-10-23 DIAGNOSIS — Z9229 Personal history of other drug therapy: Secondary | ICD-10-CM | POA: Insufficient documentation

## 2012-10-23 DIAGNOSIS — R9430 Abnormal result of cardiovascular function study, unspecified: Secondary | ICD-10-CM | POA: Insufficient documentation

## 2012-10-23 MED ORDER — APIXABAN 5 MG PO TABS: 5.0000 mg | ORAL_TABLET | Freq: Two times a day (BID) | ORAL | Status: DC

## 2012-10-23 NOTE — Patient Instructions (Addendum)
Your physician recommends that you schedule a follow-up appointment in: 6 months. You will receive a reminder letter in the mail in about 4 months reminding you to call and schedule your appointment. If you don't receive this letter, please contact our office. Your physician recommends that you continue on your current medications as directed. Please refer to the Current Medication list given to you today. You have been referred to Dr. Johney Frame.

## 2012-10-23 NOTE — Assessment & Plan Note (Signed)
The patient is holding sinus rhythm with sinus bradycardia now. Over the years he has had episodes of atrial fibrillation and he is been rapidly cardioverted. Most recently he was cardioverted approximately 4 days after the event occurred and he was on anticoagulation at that time. He feels poorly when he has atrial fibrillation. I had a long discussion with the patient about his atrial fibrillation. He is very interested in looking into the possibility of atrial fibrillation. He would be an excellent candidate for it. I discussed with him that he has not been on an antiarrhythmic up to this point. He does not have any proven coronary disease. As of today the last stress test that I can find is 2001. We will continue to look to see if there is a more recent study. He's had no sign of ischemia on his stress tests in the past. I've chosen today not to start flecainide. I am arranging for consultation with Dr. Johney Frame to see the recommended approach for this patient.  As part of today's evaluation I spent greater than 25 minutes on the total care. More than half of 25 minutes was spent with direct contact with the patient talking about atrial fibrillation and the algorithm for the approach to his current situation.

## 2012-10-23 NOTE — Progress Notes (Signed)
HPI  Bradley Rodriguez is seen today to followup atrial fibrillation. I saw him in the office on October 14, 2012. He'd been hospitalized at [facility] on September 19. Eliquis had been started. In the office his atrial fib continued. Therefore outpatient cardioversion was done on October 15, 2012. As noted before, the Bradley Rodriguez has been intermittently cardioverted in the past. It has always been done shortly after the rhythm started and anticoagulation was not needed. On this occasion the Bradley Rodriguez required 2 shocks. The second shock was done with 200 J and chest compression. It was also mentioned the Bradley Rodriguez afterwards that it would now be time to consider further treatment for his atrial fib, possibly atrial fibrillation. It is noted that the Bradley Rodriguez had received oral flecainide during his hospitalization on October 10, 2012. He was given only one loading dose and he did not convert. He has not been continued on flecainide. The Bradley Rodriguez has never been on a true antiarrhythmic in the past. There is no proven coronary disease. He says that he has had exercise tests over time. The last one that I can find at this point is from 2001. The images were completely normal. It was no scar or ischemia. The study showed no significant abnormalities.  As soon as the Bradley Rodriguez was cardioverted on September 24, he felt better. He has significant fatigue when he has atrial fibrillation.  The Bradley Rodriguez mentioned to Dr. Reuel Boom his recent atrial fib. Dr. Reuel Boom may have gotten the impression that the Bradley Rodriguez had had recurrent atrial fib since his cardioversion on October 15, 2012. This is not the case. The Bradley Rodriguez has been in sinus since the cardioversion on October 15, 2012.  As part of today's visit I have reviewed the records sent to me from the recent visit from Dr. Reuel Boom. I've also searched the records to try to find the Bradley Rodriguez's exercise tests in the past.     Allergies  Allergen Reactions  . Lisinopril Cough     Current Outpatient Prescriptions  Medication Sig Dispense Refill  . apixaban (ELIQUIS) 5 MG TABS tablet Take 5 mg by mouth 2 (two) times daily.      . chlorthalidone (HYGROTON) 25 MG tablet Take 25 mg by mouth daily.      Marland Kitchen desvenlafaxine (PRISTIQ) 50 MG 24 hr tablet Take 50 mg by mouth daily.      Marland Kitchen diltiazem (DILACOR XR) 240 MG 24 hr capsule Take 240 mg by mouth daily.      Marland Kitchen LORazepam (ATIVAN) 0.5 MG tablet Take 0.5 mg by mouth as needed for anxiety.      Marland Kitchen losartan (COZAAR) 100 MG tablet Take 100 mg by mouth daily.      . magnesium oxide (MAG-OX) 400 MG tablet Take 400 mg by mouth daily.      . metoprolol (LOPRESSOR) 50 MG tablet Take 50 mg by mouth 2 (two) times daily.       . potassium chloride SA (K-DUR,KLOR-CON) 20 MEQ tablet Take 20 mEq by mouth 2 (two) times daily.       No current facility-administered medications for this visit.    History   Social History  . Marital Status: Married    Spouse Name: N/A    Number of Children: N/A  . Years of Education: N/A   Occupational History  . Not on file.   Social History Main Topics  . Smoking status: Never Smoker   . Smokeless tobacco: Never Used  . Alcohol Use: No  . Drug  Use: No  . Sexual Activity: Not on file   Other Topics Concern  . Not on file   Social History Narrative  . No narrative on file    Family History  Problem Relation Age of Onset  . Hypertension      Past Medical History  Diagnosis Date  . S/P vasectomy   . A-fib     paroxysmal afib sucessfully cardioverted on 03/25/07  . Hypertension   . Hyperlipidemia   . Anxiety   . IBS (irritable bowel syndrome)   . Ejection fraction     Past Surgical History  Procedure Laterality Date  . Lithotripsy      for kidney stones 2002  . Eye surgery      detached retina  . Knee arthroscopy    . Cardioversion N/A 10/15/2012    Procedure: CARDIOVERSION;  Surgeon: Laurey Morale, MD;  Location: [facility] ENDOSCOPY;  Service: Cardiovascular;  Laterality: N/A;     Bradley Rodriguez Active Problem List   Diagnosis Date Noted  . Ejection fraction   . S/P vasectomy   . A-fib   . Hyperlipidemia   . Anxiety   . IBS (irritable bowel syndrome)     ROS   Bradley Rodriguez denies fever, chills, headache, sweats, rash, change in vision, change in hearing, chest pain, cough, nausea vomiting, urinary symptoms. All other systems are reviewed and are negative.  PHYSICAL EXAM  Bradley Rodriguez is oriented to person time and place. Affect is normal. He is overweight. There is no jugulovenous distention. Lungs are clear. Respiratory effort is nonlabored. Cardiac exam reveals an S1 and S2. There no clicks or significant murmurs. Abdomen is soft. There is no peripheral edema.  Filed Vitals:   10/23/12 0857  BP: 124/72  Pulse: 53  Height: 6\' 8"  (2.032 m)  Weight: 335 lb (151.955 kg)   EKG was done today and reviewed by me. There is sinus bradycardia. The rate is 53.  ASSESSMENT & PLAN

## 2012-11-27 ENCOUNTER — Other Ambulatory Visit: Payer: Self-pay

## 2012-12-08 ENCOUNTER — Institutional Professional Consult (permissible substitution): Payer: BC Managed Care – PPO | Admitting: Internal Medicine

## 2013-05-27 ENCOUNTER — Other Ambulatory Visit: Payer: Self-pay | Admitting: *Deleted

## 2013-05-27 MED ORDER — CHLORTHALIDONE 25 MG PO TABS: 25.0000 mg | ORAL_TABLET | Freq: Every day | ORAL | Status: DC

## 2013-09-15 ENCOUNTER — Other Ambulatory Visit: Payer: Self-pay | Admitting: *Deleted

## 2013-09-15 MED ORDER — CHLORTHALIDONE 25 MG PO TABS: 25.0000 mg | ORAL_TABLET | Freq: Every day | ORAL | Status: DC

## 2013-11-06 ENCOUNTER — Other Ambulatory Visit: Payer: Self-pay

## 2014-01-11 NOTE — Patient Instructions (Signed)
Your procedure is scheduled on: 01/18/2014  Report to [facility] at  800  AM.  Call this number if you have problems the morning of surgery: [phone]   Do not eat food or drink liquids :After Midnight.      Take these medicines the morning of surgery with A SIP OF WATER: ativan, chlorthalidone, pristiq, diltiazem,cozaar, metoprolol   Do not wear jewelry, make-up or nail polish.  Do not wear lotions, powders, or perfumes.   Do not shave 48 hours prior to surgery.  Do not bring valuables to the hospital.  Contacts, dentures or bridgework may not be worn into surgery.  Leave suitcase in the car. After surgery it may be brought to your room.  For patients admitted to the hospital, checkout time is 11:00 AM the day of discharge.   Patients discharged the day of surgery will not be allowed to drive home.  :     Please read over the following fact sheets that you were given: Coughing and Deep Breathing, Surgical Site Infection Prevention, Anesthesia Post-op Instructions and Care and Recovery After Surgery    Cataract A cataract is a clouding of the lens of the eye. When a lens becomes cloudy, vision is reduced based on the degree and nature of the clouding. Many cataracts reduce vision to some degree. Some cataracts make people more near-sighted as they develop. Other cataracts increase glare. Cataracts that are ignored and become worse can sometimes look white. The white color can be seen through the pupil. CAUSES   Aging. However, cataracts may occur at any age, even in newborns.   Certain drugs.   Trauma to the eye.   Certain diseases such as diabetes.   Specific eye diseases such as chronic inflammation inside the eye or a sudden attack of a rare form of glaucoma.   Inherited or acquired medical problems.  SYMPTOMS   Gradual, progressive drop in vision in the affected eye.   Severe, rapid visual loss. This most often happens when trauma is the cause.  DIAGNOSIS  To detect a  cataract, an eye doctor examines the lens. Cataracts are best diagnosed with an exam of the eyes with the pupils enlarged (dilated) by drops.  TREATMENT  For an early cataract, vision may improve by using different eyeglasses or stronger lighting. If that does not help your vision, surgery is the only effective treatment. A cataract needs to be surgically removed when vision loss interferes with your everyday activities, such as driving, reading, or watching TV. A cataract may also have to be removed if it prevents examination or treatment of another eye problem. Surgery removes the cloudy lens and usually replaces it with a substitute lens (intraocular lens, IOL).  At a time when both you and your doctor agree, the cataract will be surgically removed. If you have cataracts in both eyes, only one is usually removed at a time. This allows the operated eye to heal and be out of danger from any possible problems after surgery (such as infection or poor wound healing). In rare cases, a cataract may be doing damage to your eye. In these cases, your caregiver may advise surgical removal right away. The vast majority of people who have cataract surgery have better vision afterward. HOME CARE INSTRUCTIONS  If you are not planning surgery, you may be asked to do the following:  Use different eyeglasses.   Use stronger or brighter lighting.   Ask your eye doctor about reducing your medicine dose  or changing medicines if it is thought that a medicine caused your cataract. Changing medicines does not make the cataract go away on its own.   Become familiar with your surroundings. Poor vision can lead to injury. Avoid bumping into things on the affected side. You are at a higher risk for tripping or falling.   Exercise extreme care when driving or operating machinery.   Wear sunglasses if you are sensitive to bright light or experiencing problems with glare.  SEEK IMMEDIATE MEDICAL CARE IF:   You have a  worsening or sudden vision loss.   You notice redness, swelling, or increasing pain in the eye.   You have a fever.  Document Released: 01/08/2005 Document Revised: 12/28/2010 Document Reviewed: 09/01/2010 [facility] Patient Information 2012 [place].PATIENT INSTRUCTIONS POST-ANESTHESIA  IMMEDIATELY FOLLOWING SURGERY:  Do not drive or operate machinery for the first twenty four hours after surgery.  Do not make any important decisions for twenty four hours after surgery or while taking narcotic pain medications or sedatives.  If you develop intractable nausea and vomiting or a severe headache please notify your doctor immediately.  FOLLOW-UP:  Please make an appointment with your surgeon as instructed. You do not need to follow up with anesthesia unless specifically instructed to do so.  WOUND CARE INSTRUCTIONS (if applicable):  Keep a dry clean dressing on the anesthesia/puncture wound site if there is drainage.  Once the wound has quit draining you may leave it open to air.  Generally you should leave the bandage intact for twenty four hours unless there is drainage.  If the epidural site drains for more than 36-48 hours please call the anesthesia department.  QUESTIONS?:  Please feel free to call your physician or the hospital operator if you have any questions, and they will be happy to assist you.

## 2014-01-12 ENCOUNTER — Other Ambulatory Visit: Payer: Self-pay

## 2014-01-12 ENCOUNTER — Encounter (HOSPITAL_COMMUNITY)
Admission: RE | Admit: 2014-01-12 | Discharge: 2014-01-12 | Disposition: A | Discharge status: Home / Self Care | Payer: BC Managed Care – PPO | Source: Ambulatory Visit | Attending: Ophthalmology | Admitting: Ophthalmology

## 2014-01-12 ENCOUNTER — Encounter (HOSPITAL_COMMUNITY): Payer: Self-pay

## 2014-01-12 DIAGNOSIS — R001 Bradycardia, unspecified: Secondary | ICD-10-CM | POA: Insufficient documentation

## 2014-01-12 DIAGNOSIS — Z01818 Encounter for other preprocedural examination: Secondary | ICD-10-CM | POA: Insufficient documentation

## 2014-01-12 HISTORY — DX: Personal history of urinary calculi: Z87.442

## 2014-01-12 LAB — HEMOGLOBIN AND HEMATOCRIT, BLOOD
HEMATOCRIT: 40.5 % (ref 39.0–52.0)
Hemoglobin: 13.5 g/dL (ref 13.0–17.0)

## 2014-01-12 LAB — BASIC METABOLIC PANEL
Anion gap: 6 (ref 5–15)
BUN: 18 mg/dL (ref 6–23)
CALCIUM: 9.6 mg/dL (ref 8.4–10.5)
CO2: 33 mmol/L — AB (ref 19–32)
CREATININE: 1.13 mg/dL (ref 0.50–1.35)
Chloride: 98 mEq/L (ref 96–112)
GFR calc Af Amer: 83 mL/min — ABNORMAL LOW (ref >90)
GFR calc non Af Amer: 71 mL/min — ABNORMAL LOW (ref >90)
Glucose, Bld: 175 mg/dL — ABNORMAL HIGH (ref 70–99)
Potassium: 5 mmol/L (ref 3.5–5.1)
Sodium: 137 mmol/L (ref 135–145)

## 2014-01-12 NOTE — Progress Notes (Signed)
   01/12/14 0803  OBSTRUCTIVE SLEEP APNEA  Have you ever been diagnosed with sleep apnea through a sleep study? No  Do you snore loudly (loud enough to be heard through closed doors)?  0  Do you often feel tired, fatigued, or sleepy during the daytime? 0  Has anyone observed you stop breathing during your sleep? 0  Do you have, or are you being treated for high blood pressure? 1  BMI more than 35 kg/m2? 1  Age over 55 years old? 1  Neck circumference greater than 40 cm/16 inches? 1  Gender: 1  Obstructive Sleep Apnea Score 5  Score 4 or greater  Results sent to PCP

## 2014-01-18 ENCOUNTER — Encounter (HOSPITAL_COMMUNITY): Payer: Self-pay | Admitting: *Deleted

## 2014-01-18 ENCOUNTER — Ambulatory Visit (HOSPITAL_COMMUNITY): Payer: BC Managed Care – PPO | Admitting: Anesthesiology

## 2014-01-18 ENCOUNTER — Ambulatory Visit (HOSPITAL_COMMUNITY)
Admission: RE | Admit: 2014-01-18 | Discharge: 2014-01-18 | Disposition: A | Discharge status: Home / Self Care | Payer: BC Managed Care – PPO | Source: Ambulatory Visit | Attending: Ophthalmology | Admitting: Ophthalmology

## 2014-01-18 ENCOUNTER — Encounter (HOSPITAL_COMMUNITY)
Admission: RE | Disposition: A | Discharge status: Home / Self Care | Payer: Self-pay | Source: Ambulatory Visit | Attending: Ophthalmology

## 2014-01-18 DIAGNOSIS — H25811 Combined forms of age-related cataract, right eye: Secondary | ICD-10-CM | POA: Diagnosis not present

## 2014-01-18 HISTORY — PX: CATARACT EXTRACTION W/PHACO: SHX586

## 2014-01-18 LAB — GLUCOSE, CAPILLARY: Glucose-Capillary: 175 mg/dL — ABNORMAL HIGH (ref 70–99)

## 2014-01-18 SURGERY — PHACOEMULSIFICATION, CATARACT, WITH IOL INSERTION
Anesthesia: Monitor Anesthesia Care | Site: Eye | Laterality: Right

## 2014-01-18 MED ORDER — LIDOCAINE 3.5 % OP GEL OPTIME - NO CHARGE
OPHTHALMIC | Status: DC | PRN
  Administered 2014-01-18: 1 [drp] via OPHTHALMIC

## 2014-01-18 MED ORDER — LIDOCAINE HCL 3.5 % OP GEL
1.0000 "application " | Freq: Once | OPHTHALMIC | Status: AC
  Administered 2014-01-18: 1 via OPHTHALMIC

## 2014-01-18 MED ORDER — EPINEPHRINE HCL 1 MG/ML IJ SOLN
INTRAOCULAR | Status: DC | PRN
  Administered 2014-01-18: 500 mL

## 2014-01-18 MED ORDER — MIDAZOLAM HCL 2 MG/2ML IJ SOLN
INTRAMUSCULAR | Status: AC
  Filled 2014-01-18: qty 2

## 2014-01-18 MED ORDER — PHENYLEPHRINE HCL 2.5 % OP SOLN
1.0000 [drp] | OPHTHALMIC | Status: AC
  Administered 2014-01-18 (×3): 1 [drp] via OPHTHALMIC

## 2014-01-18 MED ORDER — NEOMYCIN-POLYMYXIN-DEXAMETH 3.5-10000-0.1 OP SUSP
OPHTHALMIC | Status: DC | PRN
  Administered 2014-01-18: 2 [drp] via OPHTHALMIC

## 2014-01-18 MED ORDER — CYCLOPENTOLATE-PHENYLEPHRINE 0.2-1 % OP SOLN
1.0000 [drp] | OPHTHALMIC | Status: AC
  Administered 2014-01-18 (×3): 1 [drp] via OPHTHALMIC

## 2014-01-18 MED ORDER — BSS IO SOLN
INTRAOCULAR | Status: DC | PRN
  Administered 2014-01-18: 15 mL

## 2014-01-18 MED ORDER — EPINEPHRINE HCL 1 MG/ML IJ SOLN
INTRAMUSCULAR | Status: AC
  Filled 2014-01-18: qty 1

## 2014-01-18 MED ORDER — MIDAZOLAM HCL 2 MG/2ML IJ SOLN
1.0000 mg | INTRAMUSCULAR | Status: DC | PRN
  Administered 2014-01-18 (×2): 2 mg via INTRAVENOUS

## 2014-01-18 MED ORDER — PROVISC 10 MG/ML IO SOLN
INTRAOCULAR | Status: DC | PRN
  Administered 2014-01-18: 0.85 mL via INTRAOCULAR

## 2014-01-18 MED ORDER — TETRACAINE HCL 0.5 % OP SOLN
1.0000 [drp] | OPHTHALMIC | Status: AC
  Administered 2014-01-18 (×3): 1 [drp] via OPHTHALMIC

## 2014-01-18 MED ORDER — FENTANYL CITRATE 0.05 MG/ML IJ SOLN
25.0000 ug | INTRAMUSCULAR | Status: AC
  Administered 2014-01-18 (×2): 25 ug via INTRAVENOUS

## 2014-01-18 MED ORDER — FENTANYL CITRATE 0.05 MG/ML IJ SOLN
INTRAMUSCULAR | Status: AC
  Filled 2014-01-18: qty 2

## 2014-01-18 MED ORDER — LACTATED RINGERS IV SOLN
INTRAVENOUS | Status: DC
  Administered 2014-01-18: 1000 mL via INTRAVENOUS

## 2014-01-18 MED ORDER — POVIDONE-IODINE 5 % OP SOLN
OPHTHALMIC | Status: DC | PRN
  Administered 2014-01-18: 1 via OPHTHALMIC

## 2014-01-18 MED ORDER — LIDOCAINE HCL (PF) 1 % IJ SOLN
INTRAMUSCULAR | Status: DC | PRN
  Administered 2014-01-18: .5 mL

## 2014-01-18 SURGICAL SUPPLY — 34 items
CAPSULAR TENSION RING-AMO (OPHTHALMIC RELATED) IMPLANT
CLOTH BEACON ORANGE TIMEOUT ST (SAFETY) ×2 IMPLANT
EYE SHIELD UNIVERSAL CLEAR (GAUZE/BANDAGES/DRESSINGS) ×2 IMPLANT
GLOVE BIO SURGEON STRL SZ 6.5 (GLOVE) IMPLANT
GLOVE BIO SURGEONS STRL SZ 6.5 (GLOVE)
GLOVE BIOGEL PI IND STRL 6.5 (GLOVE) IMPLANT
GLOVE BIOGEL PI IND STRL 7.0 (GLOVE) IMPLANT
GLOVE BIOGEL PI IND STRL 7.5 (GLOVE) IMPLANT
GLOVE BIOGEL PI INDICATOR 6.5 (GLOVE)
GLOVE BIOGEL PI INDICATOR 7.0 (GLOVE) ×4
GLOVE BIOGEL PI INDICATOR 7.5 (GLOVE)
GLOVE ECLIPSE 6.5 STRL STRAW (GLOVE) IMPLANT
GLOVE ECLIPSE 7.0 STRL STRAW (GLOVE) IMPLANT
GLOVE ECLIPSE 7.5 STRL STRAW (GLOVE) IMPLANT
GLOVE EXAM NITRILE LRG STRL (GLOVE) IMPLANT
GLOVE EXAM NITRILE MD LF STRL (GLOVE) IMPLANT
GLOVE SKINSENSE NS SZ6.5 (GLOVE)
GLOVE SKINSENSE NS SZ7.0 (GLOVE)
GLOVE SKINSENSE STRL SZ6.5 (GLOVE) IMPLANT
GLOVE SKINSENSE STRL SZ7.0 (GLOVE) IMPLANT
KIT VITRECTOMY (OPHTHALMIC RELATED) IMPLANT
PAD ARMBOARD 7.5X6 YLW CONV (MISCELLANEOUS) ×2 IMPLANT
PROC W NO LENS (INTRAOCULAR LENS)
PROC W SPEC LENS (INTRAOCULAR LENS)
PROCESS W NO LENS (INTRAOCULAR LENS) IMPLANT
PROCESS W SPEC LENS (INTRAOCULAR LENS) IMPLANT
RETRACTOR IRIS SIGHTPATH (OPHTHALMIC RELATED) IMPLANT
RING MALYGIN (MISCELLANEOUS) IMPLANT
SIGHTPATH CAT PROC W REG LENS (Ophthalmic Related) ×3 IMPLANT
SYRINGE LUER LOK 1CC (MISCELLANEOUS) ×2 IMPLANT
TAPE SURG TRANSPORE 1 IN (GAUZE/BANDAGES/DRESSINGS) IMPLANT
TAPE SURGICAL TRANSPORE 1 IN (GAUZE/BANDAGES/DRESSINGS) ×2
VISCOELASTIC ADDITIONAL (OPHTHALMIC RELATED) IMPLANT
WATER STERILE IRR 250ML POUR (IV SOLUTION) ×2 IMPLANT

## 2014-01-18 NOTE — Discharge Instructions (Signed)

## 2014-01-18 NOTE — Anesthesia Procedure Notes (Signed)
Procedure Name: MAC Date/Time: 01/18/2014 8:55 AM Performed by: Vista Deck Pre-anesthesia Checklist: Patient identified, Emergency Drugs available, Suction available, Timeout performed and Patient being monitored Patient Re-evaluated:Patient Re-evaluated prior to inductionOxygen Delivery Method: Nasal Cannula

## 2014-01-18 NOTE — Anesthesia Preprocedure Evaluation (Signed)
Anesthesia Evaluation  Patient identified by MRN, date of birth, ID band Patient awake    Reviewed: Allergy & Precautions, H&P , NPO status , Patient's Chart, lab work & pertinent test results  Airway Mallampati: II  TM Distance: >3 FB     Dental  (+) Teeth Intact   Pulmonary neg pulmonary ROS,  breath sounds clear to auscultation        Cardiovascular hypertension, Pt. on medications Rhythm:Regular Rate:Normal     Neuro/Psych PSYCHIATRIC DISORDERS Anxiety    GI/Hepatic negative GI ROS,   Endo/Other  diabetes (new onset)  Renal/GU      Musculoskeletal   Abdominal   Peds  Hematology   Anesthesia Other Findings   Reproductive/Obstetrics                             Anesthesia Physical Anesthesia Plan  ASA: III  Anesthesia Plan: MAC   Post-op Pain Management:    Induction: Intravenous  Airway Management Planned: Nasal Cannula  Additional Equipment:   Intra-op Plan:   Post-operative Plan:   Informed Consent: I have reviewed the patients History and Physical, chart, labs and discussed the procedure including the risks, benefits and alternatives for the proposed anesthesia with the patient or authorized representative who has indicated his/her understanding and acceptance.     Plan Discussed with:   Anesthesia Plan Comments:         Anesthesia Quick Evaluation

## 2014-01-18 NOTE — Anesthesia Postprocedure Evaluation (Signed)
  Anesthesia Post-op Note  Patient: Bradley Rodriguez  Procedure(s) Performed: Procedure(s) (LRB): CATARACT EXTRACTION PHACO AND INTRAOCULAR LENS PLACEMENT (IOC) (Right)  Patient Location:  Short Stay  Anesthesia Type: MAC  Level of Consciousness: awake  Airway and Oxygen Therapy: Patient Spontanous Breathing  Post-op Pain: none  Post-op Assessment: Post-op Vital signs reviewed, Patient's Cardiovascular Status Stable, Respiratory Function Stable, Patent Airway, No signs of Nausea or vomiting and Pain level controlled  Post-op Vital Signs: Reviewed and stable  Complications: No apparent anesthesia complications

## 2014-01-18 NOTE — Op Note (Signed)
Date of Admission: 01/18/2014  Date of Surgery: 01/18/2014   Pre-Op Dx: Cataract Right Eye  Post-Op Dx: Senile Combined Cataract Right  Eye,  Dx Code P49.826  Surgeon: Tonny Branch, M.D.  Assistants: None  Anesthesia: Topical with MAC  Indications: Painless, progressive loss of vision with compromise of daily activities.  Surgery: Cataract Extraction with Intraocular lens Implant Right Eye  Discription: The patient had dilating drops and viscous lidocaine placed into the Right eye in the pre-op holding area. After transfer to the operating room, a time out was performed. The patient was then prepped and draped. Beginning with a 77 degree blade a paracentesis port was made at the surgeon's 2 o'clock position. The anterior chamber was then filled with 1% non-preserved lidocaine. This was followed by filling the anterior chamber with Provisc.  A 2.12mm keratome blade was used to make a clear corneal incision at the temporal limbus.  A bent cystatome needle was used to create a continuous tear capsulotomy. Hydrodissection was performed with balanced salt solution on a Fine canula. The lens nucleus was then removed using the phacoemulsification handpiece.  Residual cortex was removed with the I&A handpiece. A dense posterior plaque was present which was not removable. The anterior chamber and capsular bag were refilled with Provisc. A posterior chamber intraocular lens was placed into the capsular bag with it's injector. The implant was positioned with the Kuglan hook. The Provisc was then removed from the anterior chamber and capsular bag with the I&A handpiece. Stromal hydration of the main incision and paracentesis port was performed with BSS on a Fine canula. The wounds were tested for leak which was negative. The patient tolerated the procedure well. There were no operative complications. The patient was then transferred to the recovery room in stable condition.  Complications: None  Specimen:  None  EBL: None  Prosthetic device: Hoya iSert 250, power 15.5 D, SN [id number].

## 2014-01-18 NOTE — Transfer of Care (Signed)
Immediate Anesthesia Transfer of Care Note  Patient: Bradley Rodriguez  Procedure(s) Performed: Procedure(s) (LRB): CATARACT EXTRACTION PHACO AND INTRAOCULAR LENS PLACEMENT (IOC) (Right)  Patient Location: Shortstay  Anesthesia Type: MAC  Level of Consciousness: awake  Airway & Oxygen Therapy: Patient Spontanous Breathing   Post-op Assessment: Report given to PACU RN, Post -op Vital signs reviewed and stable and Patient moving all extremities  Post vital signs: Reviewed and stable  Complications: No apparent anesthesia complications

## 2014-01-18 NOTE — H&P (Signed)
I have reviewed the H&P, the patient was re-examined, and I have identified no interval changes in medical condition and plan of care since the history and physical of record  

## 2014-01-19 ENCOUNTER — Encounter (HOSPITAL_COMMUNITY): Payer: Self-pay | Admitting: Ophthalmology

## 2014-08-14 ENCOUNTER — Encounter: Payer: Self-pay | Admitting: Cardiovascular Disease

## 2014-10-18 ENCOUNTER — Ambulatory Visit (INDEPENDENT_AMBULATORY_CARE_PROVIDER_SITE_OTHER): Payer: BLUE CROSS/BLUE SHIELD | Admitting: *Deleted

## 2014-10-18 ENCOUNTER — Telehealth: Payer: Self-pay | Admitting: Cardiology

## 2014-10-18 DIAGNOSIS — I4891 Unspecified atrial fibrillation: Secondary | ICD-10-CM

## 2014-10-18 NOTE — Telephone Encounter (Signed)
Patient said he went back into AFIb about a hour ago. Would like to come by and have an EKG to verify today

## 2014-10-18 NOTE — Telephone Encounter (Signed)
Patient advised to come to the office for an EKG.

## 2014-10-19 ENCOUNTER — Encounter: Payer: Self-pay | Admitting: *Deleted

## 2014-10-19 ENCOUNTER — Encounter: Payer: Self-pay | Admitting: Cardiovascular Disease

## 2014-10-19 ENCOUNTER — Ambulatory Visit (INDEPENDENT_AMBULATORY_CARE_PROVIDER_SITE_OTHER): Payer: BLUE CROSS/BLUE SHIELD | Admitting: Cardiovascular Disease

## 2014-10-19 VITALS — BP 126/79 | HR 63 | Ht >= 80 in | Wt 310.0 lb

## 2014-10-19 DIAGNOSIS — Z7189 Other specified counseling: Secondary | ICD-10-CM | POA: Diagnosis not present

## 2014-10-19 DIAGNOSIS — E119 Type 2 diabetes mellitus without complications: Secondary | ICD-10-CM

## 2014-10-19 DIAGNOSIS — I10 Essential (primary) hypertension: Secondary | ICD-10-CM | POA: Diagnosis not present

## 2014-10-19 DIAGNOSIS — I480 Paroxysmal atrial fibrillation: Secondary | ICD-10-CM

## 2014-10-19 NOTE — Patient Instructions (Signed)
   Office will contact you after getting HgbA1c number prior to starting Metformin. Continue all current medications. Your physician wants you to follow up in:  2 years.  You will receive a reminder letter in the mail one-two months in advance.  If you don't receive a letter, please call our office to schedule the follow up appointment

## 2014-10-19 NOTE — Progress Notes (Signed)
Patient ID: Bradley Rodriguez, male   DOB: 11/16/1960, 54 y.o.   MRN: [id number]      SUBJECTIVE: The patient is a 54 year old male with a history of atrial fibrillation, hypertension, and obesity. He is a former patient of Dr. Ron Parker and this is my first time meeting him. Records from an office visit dated 10/23/12 indicate he previously underwent an outpatient cardioversion on 10/15/12. Dr. Kae Heller note mentions referring him to Dr. Rayann Heman for consideration of atrial fibrillation ablation, but there is no consultation found in [place]. He had thought he went into atrial fibrillation yesterday and came to the office for an ECG which demonstrated normal sinus rhythm.  He said he worries quite a bit and was very anxious yesterday and his symptoms may have been due to that. He underwent foot reconstructive surgery on 06/02/14 and has been out of commission since that time. He had been on oral anticoagulation in the past but developed bleeding with kidney stones and it was stopped. He tells me he used to have bad acid reflux but no longer suffers from this anymore. He has also lost 40 pounds. He is not certain whether he is on metformin for diabetes or prediabetes. I do not have his original A1c value.  Echocardiogram performed on 10/10/12 at [facility] demonstrated normal left ventricular systolic function, LVEF 20-35%, normal left atrial size, and mild LVH.    Review of Systems: As per "subjective", otherwise negative.  Allergies  Allergen Reactions  . Lisinopril Cough  . Quinine Derivatives Rash    Current Outpatient Prescriptions  Medication Sig Dispense Refill  . allopurinol (ZYLOPRIM) 300 MG tablet Take 2 tablets by mouth daily.  3  . amLODipine (NORVASC) 5 MG tablet Take 5 mg by mouth daily.  3  . aspirin EC 81 MG tablet Take 81 mg by mouth daily.    . Calcium Carbonate (CALCIUM 600 PO) Take 1 tablet by mouth daily.    . chlorthalidone (HYGROTON) 25 MG tablet Take 1 tablet (25 mg total) by  mouth daily. 30 tablet 0  . losartan (COZAAR) 100 MG tablet Take 100 mg by mouth daily.    . metFORMIN (GLUCOPHAGE-XR) 500 MG 24 hr tablet Take 1 tablet by mouth daily.  3  . metoprolol (LOPRESSOR) 50 MG tablet Take 50 mg by mouth 2 (two) times daily.     . Turmeric 500 MG CAPS Take 1 capsule by mouth daily.    . Vitamin D, Ergocalciferol, (DRISDOL) 50000 UNITS CAPS capsule Take 50,000 Units by mouth once a week.  1   No current facility-administered medications for this visit.    Past Medical History  Diagnosis Date  . S/P vasectomy   . A-fib     paroxysmal afib sucessfully cardioverted on 03/25/07  . Hypertension   . Hyperlipidemia   . Anxiety   . IBS (irritable bowel syndrome)   . Ejection fraction   . Normal cardiac stress test     Normal nuclear stress test ,, 2001  . HX: anticoagulation   . History of kidney stones     Past Surgical History  Procedure Laterality Date  . Lithotripsy      for kidney stones 2002  . Knee arthroscopy Right   . Cardioversion N/A 10/15/2012    Procedure: CARDIOVERSION;  Surgeon: Larey Dresser, MD;  Location: [facility];  Service: Cardiovascular;  Laterality: N/A;  . Eye surgery Right     detached retina  . Vasectomy    . Cataract  extraction w/phaco Right 01/18/2014    Procedure: CATARACT EXTRACTION PHACO AND INTRAOCULAR LENS PLACEMENT (IOC);  Surgeon: Tonny Branch, MD;  Location: [facility];  Service: Ophthalmology;  Laterality: Right;  CDE 9.95    Social History   Social History  . Marital Status: Married    Spouse Name: N/A  . Number of Children: N/A  . Years of Education: N/A   Occupational History  . Not on file.   Social History Main Topics  . Smoking status: Never Smoker   . Smokeless tobacco: Never Used  . Alcohol Use: No  . Drug Use: No  . Sexual Activity: Yes    Birth Control/ Protection: Surgical   Other Topics Concern  . Not on file   Social History Narrative     Filed Vitals:   10/19/14 1540  BP: 126/79    Pulse: 63  Height: 6\' 8"  (2.032 m)  Weight: 310 lb (140.615 kg)  SpO2: 96%    PHYSICAL EXAM General: NAD HEENT: Normal. Neck: No JVD, no thyromegaly. Lungs: Clear to auscultation bilaterally with normal respiratory effort. CV: Nondisplaced PMI.  Regular rate and rhythm, normal S1/S2, no S3/S4, no murmur. No pretibial or periankle edema.  No carotid bruit.  Left foot bandaged.  Abdomen: Firm, obese, no distention.  Neurologic: Alert and oriented x 3.  Psych: Normal affect. Skin: Normal. Extremities: No clubbing or cyanosis.   ECG: Most recent ECG reviewed.      ASSESSMENT AND PLAN: 1. Paroxysmal atrial fibrillation: No evidence for atrial fibrillation by ECG yesterday. CHA2DS2VASC score possibly 2 (hypertension, diabetes?), thus anticoagulation may be indicated. However, it is unclear whether or not he is being treated with metformin for prediabetes or frank diabetes. I will try to obtain his original HbA1c value. If he indeed has diabetes mellitus, I will stop ASA and start Savaysa 60 mg daily. He is in agreement with this plan. Can continue metoprolol.  2. Essential HTN: Controlled on multiple antihypertensive agents. No changes to therapy.  Dispo: f/u 2 years.  Time spent: 40 minutes, of which greater than 50% was spent reviewing symptoms, relevant blood tests and studies, and discussing management plan with the patient.   Kate Sable, M.D., F.A.C.C.

## 2014-10-19 NOTE — Progress Notes (Signed)
Patient came to the office for an EKG per Dr. Harl Bowie. No c/o sob, chest pain or dizziness. Medications reviewed with patient. No side effects noted. Patient was given an appointment to come in to see Dr. Bronson Ing on 10/19/14.

## 2014-11-19 ENCOUNTER — Telehealth: Payer: Self-pay | Admitting: Internal Medicine

## 2014-11-19 ENCOUNTER — Emergency Department (HOSPITAL_COMMUNITY)
Admission: EM | Admit: 2014-11-19 | Discharge: 2014-11-20 | Disposition: A | Discharge status: Home / Self Care | Payer: BLUE CROSS/BLUE SHIELD | Attending: Emergency Medicine | Admitting: Emergency Medicine

## 2014-11-19 ENCOUNTER — Encounter (HOSPITAL_COMMUNITY): Payer: Self-pay | Admitting: *Deleted

## 2014-11-19 DIAGNOSIS — Z7982 Long term (current) use of aspirin: Secondary | ICD-10-CM | POA: Diagnosis not present

## 2014-11-19 DIAGNOSIS — Z8719 Personal history of other diseases of the digestive system: Secondary | ICD-10-CM | POA: Insufficient documentation

## 2014-11-19 DIAGNOSIS — Z87442 Personal history of urinary calculi: Secondary | ICD-10-CM | POA: Diagnosis not present

## 2014-11-19 DIAGNOSIS — E785 Hyperlipidemia, unspecified: Secondary | ICD-10-CM | POA: Insufficient documentation

## 2014-11-19 DIAGNOSIS — Z79899 Other long term (current) drug therapy: Secondary | ICD-10-CM | POA: Insufficient documentation

## 2014-11-19 DIAGNOSIS — R002 Palpitations: Secondary | ICD-10-CM | POA: Diagnosis present

## 2014-11-19 DIAGNOSIS — I480 Paroxysmal atrial fibrillation: Secondary | ICD-10-CM | POA: Diagnosis not present

## 2014-11-19 DIAGNOSIS — Z8659 Personal history of other mental and behavioral disorders: Secondary | ICD-10-CM | POA: Diagnosis not present

## 2014-11-19 DIAGNOSIS — I10 Essential (primary) hypertension: Secondary | ICD-10-CM | POA: Diagnosis not present

## 2014-11-19 DIAGNOSIS — Z7901 Long term (current) use of anticoagulants: Secondary | ICD-10-CM | POA: Insufficient documentation

## 2014-11-19 MED ORDER — FLECAINIDE ACETATE 100 MG PO TABS
300.0000 mg | ORAL_TABLET | Freq: Once | ORAL | Status: AC
  Administered 2014-11-19: 300 mg via ORAL
  Filled 2014-11-19: qty 3

## 2014-11-19 MED ORDER — METOPROLOL TARTRATE 1 MG/ML IV SOLN
5.0000 mg | Freq: Once | INTRAVENOUS | Status: DC
Start: 2014-11-19 — End: 2014-11-20
  Filled 2014-11-19: qty 5

## 2014-11-19 MED ORDER — FLECAINIDE ACETATE 100 MG PO TABS
ORAL_TABLET | ORAL | Status: AC
  Filled 2014-11-19: qty 3

## 2014-11-19 NOTE — ED Notes (Signed)
Pt and family updated on plan of care, denies any complaints at present,  

## 2014-11-19 NOTE — ED Notes (Addendum)
Pt states he feels like he went into a-fib about 7:30 pm. Denies any other symptoms but increased heart rate.

## 2014-11-19 NOTE — Telephone Encounter (Signed)
Cardiology [place]  Called by ED physician at [facility] regarding the patient presenting with Afib with HR currently in the 60's, in the 110's on presentation, which started this evening ~ 7:30 pm.  No AVN blockade was given.  The ED physician gave a single dose of Flecainide as the pt had previously used this with a pill in the pocket approach.  No conversion to sinus had yet taken place.  I advised that this would not be an ideal medication for the pt as a daily drug given his IVSd of 1.7.  Instead, I advised that he could give the pt Diltiazem 30 mg PRN for elevated HR, continuing his current Metoprolol, & that I would forward this message to his cardiologistDr. Bronson Ing for further follow-up to be coordinated on an outpatient basis.    Also, per his recent evaluation with Dr. Bronson Ing (10/19/14), the decision for anticoagulation hinged on his HgbA1c, which was consistent with pre-diabetes rather than diabetes. Hence, he is adequately covered with ASA.    Frann Rider, MD

## 2014-11-19 NOTE — ED Provider Notes (Signed)
CSN: [id number]     Arrival date & time 11/19/14  2019 History  By signing my name below, I, Randa Evens, attest that this documentation has been prepared under the direction and in the presence of Tanna Furry, MD. Electronically Signed: Randa Evens, ED Scribe. 11/19/2014. 11:59 PM.    Chief Complaint  Patient presents with  . Tachycardia   The history is provided by the patient. No language interpreter was used.   HPI Comments: Bradley Rodriguez is a 54 y.o. male who presents to the Emergency Department complaining of palpitations onset tonight at 7:30 PM. He describes the palpitations as a "fish flopping in his chest." Pt states that his symptoms began while at dinner tonight after drinking a cold glass of water. Pt states that he feels as if he is in a-fib. Pt state that he he has a HX of a-fib since 1996 and that he goes into a-fib every 2-3 years. Pt states that he has been compliant with taking his medications (amlodipine, chlorthalidone, losartan, met formin and metoprolol). Pt denies any anticoagulant use. Denies CP, light headed, SOB or dizziness.     Past Medical History  Diagnosis Date  . S/P vasectomy   . A-fib (HCC)     paroxysmal afib sucessfully cardioverted on 03/25/07  . Hypertension   . Hyperlipidemia   . Anxiety   . IBS (irritable bowel syndrome)   . Ejection fraction   . Normal cardiac stress test     Normal nuclear stress test ,, 2001  . HX: anticoagulation   . History of kidney stones    Past Surgical History  Procedure Laterality Date  . Lithotripsy      for kidney stones 2002  . Knee arthroscopy Right   . Cardioversion N/A 10/15/2012    Procedure: CARDIOVERSION;  Surgeon: Larey Dresser, MD;  Location: [place];  Service: Cardiovascular;  Laterality: N/A;  . Eye surgery Right     detached retina  . Vasectomy    . Cataract extraction w/phaco Right 01/18/2014    Procedure: CATARACT EXTRACTION PHACO AND INTRAOCULAR LENS PLACEMENT (IOC);  Surgeon:  Tonny Branch, MD;  Location: [facility];  Service: Ophthalmology;  Laterality: Right;  CDE 9.95   Family History  Problem Relation Age of Onset  . Hypertension     Social History  Substance Use Topics  . Smoking status: Never Smoker   . Smokeless tobacco: Never Used  . Alcohol Use: No    Review of Systems  Constitutional: Negative for fever, chills, diaphoresis, appetite change and fatigue.  HENT: Negative for mouth sores, sore throat and trouble swallowing.   Eyes: Negative for visual disturbance.  Respiratory: Negative for cough, chest tightness, shortness of breath and wheezing.   Cardiovascular: Positive for palpitations. Negative for chest pain.  Gastrointestinal: Negative for nausea, vomiting, abdominal pain, diarrhea and abdominal distention.  Endocrine: Negative for polydipsia, polyphagia and polyuria.  Genitourinary: Negative for dysuria, frequency and hematuria.  Musculoskeletal: Negative for gait problem.  Skin: Negative for color change, pallor and rash.  Neurological: Negative for dizziness, syncope, light-headedness and headaches.  Hematological: Does not bruise/bleed easily.  Psychiatric/Behavioral: Negative for behavioral problems and confusion.     Allergies  Ace inhibitors; Lisinopril; and Quinine derivatives  Home Medications   Prior to Admission medications   Medication Sig Start Date End Date Taking? Authorizing Provider  allopurinol (ZYLOPRIM) 300 MG tablet Take 2 tablets by mouth daily. 09/29/14  Yes Historical Provider, MD  amLODipine (NORVASC) 5 MG tablet  Take 5 mg by mouth daily. 09/29/14  Yes Historical Provider, MD  aspirin EC 81 MG tablet Take 81 mg by mouth daily.   Yes Historical Provider, MD  calcium carbonate (CALCIUM 600) 600 MG TABS tablet Take 600 mg by mouth daily.    Yes Historical Provider, MD  chlorthalidone (HYGROTON) 25 MG tablet Take 1 tablet (25 mg total) by mouth daily. 09/15/13  Yes Carlena Bjornstad, MD  losartan (COZAAR) 100 MG tablet Take  100 mg by mouth daily.   Yes Historical Provider, MD  metFORMIN (GLUCOPHAGE-XR) 500 MG 24 hr tablet Take 1 tablet by mouth daily. 07/23/14  Yes Historical Provider, MD  metoprolol (LOPRESSOR) 50 MG tablet Take 100 mg by mouth daily.    Yes Historical Provider, MD  Turmeric 500 MG CAPS Take 1 capsule by mouth daily.   Yes Historical Provider, MD  Vitamin D, Ergocalciferol, (DRISDOL) 50000 UNITS CAPS capsule Take 50,000 Units by mouth every 7 (seven) days.  10/27/14  Yes Historical Provider, MD   BP 95/78 mmHg  Pulse 65  Temp(Src) 98.1 F (36.7 C) (Oral)  Resp 12  Ht 6' 8"  (2.032 m)  Wt 305 lb (138.347 kg)  BMI 33.51 kg/m2  SpO2 98%   Physical Exam  Constitutional: He is oriented to person, place, and time. He appears well-developed and well-nourished. No distress.  HENT:  Head: Normocephalic.  Eyes: Conjunctivae are normal. Pupils are equal, round, and reactive to light. No scleral icterus.  Neck: Normal range of motion. Neck supple. No thyromegaly present.  Cardiovascular: Normal rate.  An irregularly irregular rhythm present. Exam reveals no gallop and no friction rub.   No murmur heard. Pulmonary/Chest: Effort normal and breath sounds normal. No respiratory distress. He has no wheezes. He has no rales.  Abdominal: Soft. Bowel sounds are normal. He exhibits no distension. There is no tenderness. There is no rebound.  Musculoskeletal: Normal range of motion.  Neurological: He is alert and oriented to person, place, and time.  Skin: Skin is warm and dry. No rash noted.  Psychiatric: He has a normal mood and affect. His behavior is normal.    ED Course  Procedures (including critical care time) DIAGNOSTIC STUDIES: Oxygen Saturation is 97% on RA, normal by my interpretation.    COORDINATION OF CARE: 11:59 PM-Discussed treatment plan with pt at bedside and pt agreed to plan.     Labs Review Labs Reviewed - No data to display  Imaging Review No results found.    EKG  Interpretation None      MDM   Final diagnoses:  Paroxysmal atrial fibrillation [facility])     Patient presents in an episode of paroxysmal A. fib onset at 7:30 tonight. Unfortunately he had just eaten and has a full stomach and cannot be sedated and cardioverted. Was given flecainide. I discussed the case with Dr. Noberto Retort of cardiology. She felt that continuing him on flecainide would be contraindicated because of a thickened intraventricular septum/LVH. His rate is controlled he is him in an increased stable not in congestive heart failure. Appropriate for outpatient discharge. Will be given Cardizem "pill in a pocket"to take with any rate over 100 at rest. Follow-up with Dr. Patrice Paradise was warned on Monday. Full dose aspirin.  Patient's chadsvasc2 score is one or 2 based on hypertension, and plus minus DMII.     Tanna Furry, MD 11/20/14 0001

## 2014-11-20 MED ORDER — DILTIAZEM HCL 30 MG PO TABS: 30.0000 mg | ORAL_TABLET | Freq: Four times a day (QID) | ORAL | Status: DC

## 2014-11-20 NOTE — ED Notes (Signed)
Pt discharged hr remained in 70-80's, a-fib in rhythm,

## 2014-11-20 NOTE — ED Notes (Signed)
Pt remains in a-fib,

## 2014-11-20 NOTE — Telephone Encounter (Signed)
Entered in error

## 2014-11-20 NOTE — ED Notes (Signed)
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  Spoke with Dr. Oleta Mouse and was told to to make medication prn HR greater than 100 at rest as written in Dr. Jeneen Rinks note.    Reports 5 tabs should last pt until Monday per Dr. Oleta Mouse and if not pt to return to er as needed.

## 2014-11-20 NOTE — Discharge Instructions (Signed)
Check your pulse and blood pressure daily. If your pulse gets over 100, take a dose of your new prescribed Cardizem "pill in the pocket". Full dose aspirin 325 mg daily. Continue your Lopressor at its current dose. Call Dr. Jacinta Shoe.on Monday for follow-up appointment   Atrial Fibrillation Atrial fibrillation is a type of heartbeat that is irregular or fast (rapid). If you have this condition, your heart keeps quivering in a weird (chaotic) way. This condition can make it so your heart cannot pump blood normally. Having this condition gives a person more risk for stroke, heart failure, and other heart problems. There are different types of atrial fibrillation. Talk with your doctor to learn about the type that you have. HOME CARE  Take over-the-counter and prescription medicines only as told by your doctor.  If your doctor prescribed a blood-thinning medicine, take it exactly as told. Taking too much of it can cause bleeding. If you do not take enough of it, you will not have the protection that you need against stroke and other problems.  Do not use any tobacco products. These include cigarettes, chewing tobacco, and e-cigarettes. If you need help quitting, ask your doctor.  If you have apnea (obstructive sleep apnea), manage it as told by your doctor.  Do not drink alcohol.  Do not drink beverages that have caffeine. These include coffee, soda, and tea.  Maintain a healthy weight. Do not use diet pills unless your doctor says they are safe for you. Diet pills may make heart problems worse.  Follow diet instructions as told by your doctor.  Exercise regularly as told by your doctor.  Keep all follow-up visits as told by your doctor. This is important. GET HELP IF:  You notice a change in the speed, rhythm, or strength of your heartbeat.  You are taking a blood-thinning medicine and you notice more bruising.  You get tired more easily when you move or exercise. GET HELP RIGHT  AWAY IF:  You have pain in your chest or your belly (abdomen).  You have sweating or weakness.  You feel sick to your stomach (nauseous).  You notice blood in your throw up (vomit), poop (stool), or pee (urine).  You are short of breath.  You suddenly have swollen feet and ankles.  You feel dizzy.  Your suddenly get weak or numb in your face, arms, or legs, especially if it happens on one side of your body.  You have trouble talking, trouble understanding, or both.  Your face or your eyelid droops on one side. These symptoms may be an emergency. Do not wait to see if the symptoms will go away. Get medical help right away. Call your local emergency services (911 in the [place] Do not drive yourself to the hospital.   This information is not intended to replace advice given to you by your health care provider. Make sure you discuss any questions you have with your health care provider.   Document Released: 10/18/2007 Document Revised: 09/29/2014 Document Reviewed: 05/05/2014 Elsevier Interactive Patient Education [employer].

## 2014-11-22 ENCOUNTER — Telehealth: Payer: Self-pay | Admitting: Cardiovascular Disease

## 2014-11-22 ENCOUNTER — Other Ambulatory Visit: Payer: Self-pay | Admitting: *Deleted

## 2014-11-22 DIAGNOSIS — I4891 Unspecified atrial fibrillation: Secondary | ICD-10-CM

## 2014-11-22 MED ORDER — DILTIAZEM HCL 30 MG PO TABS: 30.0000 mg | ORAL_TABLET | Freq: Four times a day (QID) | ORAL | Status: DC

## 2014-11-22 NOTE — Telephone Encounter (Signed)
Bradley Rodriguez called the office stating that he was seen in the ER at [facility] over the weekend. He states that he was told to contact our office today To be seen. He states that he was told we would be doing a procedure on him.

## 2014-11-22 NOTE — Telephone Encounter (Signed)
Patient walked into the office saying his was very nervous and having anxiety about his heart being out of rhythm. Nurse advised patient that he has an appointment tomorrow at the [facility]. Office to follow up with his abnormal heart rhythm per Dr. Bronson Ing. Patient advised that additional diltiazem 30 mg would be sent to his pharmacy to treat his symptoms in the meantime. Patient said that he felt he needed someone to talk to. Patient said he felt much better after being able to talk with nurse. Patient advised to call office back if he felt anxiety again.

## 2014-11-22 NOTE — Telephone Encounter (Signed)
Per Dr. Bronson Ing: Needs EP referral to Dr. Rayann Heman

## 2014-11-23 ENCOUNTER — Encounter: Payer: Self-pay | Admitting: Physician Assistant

## 2014-11-23 ENCOUNTER — Ambulatory Visit (INDEPENDENT_AMBULATORY_CARE_PROVIDER_SITE_OTHER): Payer: BLUE CROSS/BLUE SHIELD | Admitting: Physician Assistant

## 2014-11-23 VITALS — BP 115/68 | HR 92 | Ht >= 80 in | Wt 309.0 lb

## 2014-11-23 DIAGNOSIS — I10 Essential (primary) hypertension: Secondary | ICD-10-CM

## 2014-11-23 DIAGNOSIS — I480 Paroxysmal atrial fibrillation: Secondary | ICD-10-CM | POA: Diagnosis not present

## 2014-11-23 DIAGNOSIS — E139 Other specified diabetes mellitus without complications: Secondary | ICD-10-CM | POA: Diagnosis not present

## 2014-11-23 MED ORDER — RIVAROXABAN 20 MG PO TABS: 20.0000 mg | ORAL_TABLET | Freq: Every day | ORAL | Status: DC

## 2014-11-23 NOTE — Progress Notes (Signed)
Cardiology Office Note Date:  11/23/2014  Patient ID:  Bradley, Rodriguez August 25, 1960, MRN [id number] PCP:  Gar Ponto, MD  Cardiologist:  Dr. Bronson Ing Electrophysiologist: Dr. Caryl Comes   Chief Complaint: Atrial fibrillation  History of Present Illness: Bradley Rodriguez is a 54 y.o. male with history of HTN, PAFib, hyperlipidemia, Anxiety.  He was in the ER 11/20/14 palpitations and concern for AF, he was given cardizem "pill in the pocket" to use and to f/u with his cardiologist who referred him to EP.  He was seen by Dr. Caryl Comes, possibly 6 or 8 years ago, but no EP since that time.  He denies any of CP or SOB with or without the AF, no dizziness, near syncope or syncope either.  HE can tell the moment he goes into Af though and gives him profound anxiety and can feel his rhythm irregular.   His PAF goes back to 1996, has an exacerbation about every 2-3 years and has been cardioverted 3-4 times.  Very remotely he took Coumadin briefly for the purpose of CV and tolerated it well, he reports hematuria in 2014 with Eliquis and some kidney stones at the time. He recalls historically trying Flecainide pill in the pocket but this was unsuccessful for him.   Past Medical History  Diagnosis Date  . A-fib [facility])     DCCV 2009, 2012, 2014  . Hypertension   . Hyperlipidemia     patient denies this  . Anxiety   . IBS (irritable bowel syndrome)   . Normal cardiac stress test     Normal nuclear stress test , 2001  . HX: anticoagulation     very remotely and briefly on COumadin aound one of his CV, 2014 on Eliquis had hematuria with renal calculi and stopped  . History of kidney stones     Past Surgical History  Procedure Laterality Date  . Lithotripsy      for kidney stones 2002  . Knee arthroscopy Right   . Cardioversion N/A 10/15/2012    Procedure: CARDIOVERSION;  Surgeon: Larey Dresser, MD;  Location: [place];  Service: Cardiovascular;  Laterality: N/A;  . Eye surgery Right    detached retina  . Vasectomy    . Cataract extraction w/phaco Right 01/18/2014    Procedure: CATARACT EXTRACTION PHACO AND INTRAOCULAR LENS PLACEMENT (IOC);  Surgeon: Tonny Branch, MD;  Location: [facility];  Service: Ophthalmology;  Laterality: Right;  CDE 9.95    Current Outpatient Prescriptions  Medication Sig Dispense Refill  . allopurinol (ZYLOPRIM) 300 MG tablet Take 2 tablets by mouth daily.  3  . amLODipine (NORVASC) 5 MG tablet Take 5 mg by mouth daily.  3  . aspirin 325 MG EC tablet Take 325 mg by mouth daily.    . calcium carbonate (CALCIUM 600) 600 MG TABS tablet Take 600 mg by mouth daily.     . chlorthalidone (HYGROTON) 25 MG tablet Take 1 tablet (25 mg total) by mouth daily. 30 tablet 0  . diltiazem (CARDIZEM) 30 MG tablet Take 1 tablet (30 mg total) by mouth 4 (four) times daily. 10 tablet 0  . losartan (COZAAR) 100 MG tablet Take 100 mg by mouth daily.    . metoprolol (LOPRESSOR) 50 MG tablet Take 100 mg by mouth daily.     . Turmeric 500 MG CAPS Take 1 capsule by mouth daily.    . Vitamin D, Ergocalciferol, (DRISDOL) 50000 UNITS CAPS capsule Take 50,000 Units by mouth every 7 (seven) days.  No current facility-administered medications for this visit.    Allergies:   Ace inhibitors; Lisinopril; and Quinine derivatives   Social History:  The patient  reports that he has never smoked. He has never used smokeless tobacco. He reports that he does not drink alcohol or use illicit drugs.   Family History:  The patient's family history includes Hypertension in an other family member. Both parents had PAF  ROS:  Please see the history of present illness. All other systems are reviewed and otherwise negative.  PHYSICAL EXAM:  115/68,  Weight 309lbs Well nourished, well developed, in no acute distress HEENT: normocephalic, atraumatic Neck: no JVD, carotid bruits or masses Cardiac:  irreg-irreg;  no significant murmurs, no rubs, or gallops Lungs:  clear to auscultation  bilaterally, no wheezing, rhonchi or rales Abd: soft, nontender,  + BS MS: no deformity or atrophy Ext: no edema Skin: warm and dry, no rash Neuro:  No gross deficits appreciated Psych: euthymic mood, full affect   EKG:  Done today shows Afib, 92bpm EKG on 11/19/14: AFib, 87bpm EKG 10/18/14: SR  Recent Labs: [facility]: 05/28/14 HgbA1C 6.2 10/12/14: BUN/Creat 16/1.3 (calc Cr. Clearance = 127.28), H/H 14/41, plts 284 01/12/2014: BUN 18; Creatinine, Ser 1.13; Hemoglobin 13.5; Potassium 5.0; Sodium 137     Wt Readings from Last 3 Encounters:  11/23/14 309 lb (140.161 kg)  11/19/14 305 lb (138.347 kg)  10/19/14 310 lb (140.615 kg)     Other studies reviewed: ER record, cardiology office notes reveal: Echocardiogram performed on 10/10/12 at [facility] demonstrated normal left ventricular systolic function, LVEF 63-78%, normal left atrial size, and mild LVH.   ASSESSMENT AND PLAN:  1. PAF     Hx of DCCV 2009, 2012, 2014     Hx of being on Eliquis by records, stopped secondary to hematuria/renal calculi     CHADs2Vasc with DM is 2     Dr. Caryl Comes discussed at length his Chads vasc score, and if DM need to consider longterm anticoagulation.      Start Xarelto, the pt is counseled on the medicine, bleeding/signs of bleeding to notify us , and the importance of not missing any doses. 2.   HTN     Appears well controlled 3.  Per the patient about 45mo ago dx with "pre-DM"      On metformin, and has intentionally lost 50lbs with diet and exercise  The patient is seen in conjunction with Dr. Caryl Comes, Plan of care is with Dr. Caryl Comes:  PLAN: He is taking ASA 81mg  daily, no known CAD/vascular disease, stop the ASA, start Xarelto 20mg  daily to start today, plan for TEE/DCCV on Friday given he is so symptomatic with anxiety.  We will draw for CBC, BMET and TSH.  He will also stop the Metformin and we will get a Hgb A1c prior to his next visit at the end of November to further  discuss anticoagulation pending his A1C result.    Disposition: F/u with EP APP in 3-4 weeks, sooner if needed.  Current medicines are reviewed at length with the patient today.  The patient did not have any concerns regarding medicines.  Haywood Lasso, PA-C 11/23/2014 2:10 PM     [facility] [address] [place] [address] [phone] (office)  [phone] (fax)

## 2014-11-23 NOTE — Patient Instructions (Addendum)
.  Medication Instructions:    STOP ASPIRIN    STOP TAKING METFORMIN    START TAKING XARELTO 20 MG ONCE A DAY     If you need a refill on your cardiac medications before your next appointment, please call your pharmacy.  Labwork: BMET CBC TSH   TODAY  AND HGBA1C ON ONE WEEK PRIOR TO FOLLOW UP VISIT  Testing/Procedures: NONE ORDER TODAY    Follow-Up:  IN ONE MONTH WITH RENEE URSUY  PA OR AMBER Hoffman NP    ( CHECK AVAILABILTY AT THE END OF MONTH FOR E ITHER  PROVIDER  )   Any Other Special Instructions Will Be Listed Below (If Applicable).  SOME ONE WILL CONTACT YOU WITH INFORMATION  ON TEE/ CARDIOVERSION

## 2014-11-24 ENCOUNTER — Telehealth: Payer: Self-pay | Admitting: Physician Assistant

## 2014-11-24 LAB — CBC
HEMATOCRIT: 42 % (ref 39.0–52.0)
HEMOGLOBIN: 13.9 g/dL (ref 13.0–17.0)
MCH: 29.2 pg (ref 26.0–34.0)
MCHC: 33.1 g/dL (ref 30.0–36.0)
MCV: 88.2 fL (ref 78.0–100.0)
MPV: 10.8 fL (ref 8.6–12.4)
Platelets: 366 10*3/uL (ref 150–400)
RBC: 4.76 MIL/uL (ref 4.22–5.81)
RDW: 14.9 % (ref 11.5–15.5)
WBC: 8.4 10*3/uL (ref 4.0–10.5)

## 2014-11-24 LAB — BASIC METABOLIC PANEL
BUN: 30 mg/dL — ABNORMAL HIGH (ref 7–25)
CHLORIDE: 99 mmol/L (ref 98–110)
CO2: 26 mmol/L (ref 20–31)
Calcium: 9.6 mg/dL (ref 8.6–10.3)
Creat: 1.19 mg/dL (ref 0.70–1.33)
GLUCOSE: 104 mg/dL — AB (ref 65–99)
Potassium: 4.1 mmol/L (ref 3.5–5.3)
Sodium: 139 mmol/L (ref 135–146)

## 2014-11-24 LAB — TSH: TSH: 1.422 u[IU]/mL (ref 0.350–4.500)

## 2014-11-24 NOTE — Telephone Encounter (Signed)
New problem    Pt was seen in the office yesterday and was told by Tommye Standard that he would have a cardio version. Pt want call back to advise of time and location. Please call pt.

## 2014-11-24 NOTE — Telephone Encounter (Signed)
Calling stating that someone was to call him today to tell him time for TEE/DCCV for Friday 11/4.  There was not a procedure scheduled for Fri in Epic. Pt was told by Dr. Caryl Comes yesterday that he could have procedure done at [facility].  Scheduled with Day Surgery Hoyle Sauer) at [facility] for Fri 11/4 at 11:30- pt to arrive at 10:00 with Dr. Harl Bowie.  Gave pt time and reviewed instructions regarding medications. He verbalizes understanding and appreciates procedure being scheduled for Fri.

## 2014-11-25 ENCOUNTER — Other Ambulatory Visit: Payer: Self-pay | Admitting: Cardiology

## 2014-11-25 DIAGNOSIS — Z79899 Other long term (current) drug therapy: Secondary | ICD-10-CM | POA: Diagnosis not present

## 2014-11-25 DIAGNOSIS — F419 Anxiety disorder, unspecified: Secondary | ICD-10-CM | POA: Diagnosis not present

## 2014-11-25 DIAGNOSIS — I10 Essential (primary) hypertension: Secondary | ICD-10-CM | POA: Diagnosis not present

## 2014-11-25 DIAGNOSIS — I480 Paroxysmal atrial fibrillation: Secondary | ICD-10-CM

## 2014-11-25 DIAGNOSIS — E785 Hyperlipidemia, unspecified: Secondary | ICD-10-CM | POA: Diagnosis not present

## 2014-11-25 DIAGNOSIS — Z01810 Encounter for preprocedural cardiovascular examination: Secondary | ICD-10-CM | POA: Diagnosis not present

## 2014-11-25 DIAGNOSIS — I4891 Unspecified atrial fibrillation: Secondary | ICD-10-CM | POA: Diagnosis not present

## 2014-11-26 ENCOUNTER — Ambulatory Visit (HOSPITAL_COMMUNITY): Payer: BLUE CROSS/BLUE SHIELD | Admitting: Anesthesiology

## 2014-11-26 ENCOUNTER — Encounter (HOSPITAL_COMMUNITY)
Admission: RE | Disposition: A | Discharge status: Home / Self Care | Payer: Self-pay | Source: Ambulatory Visit | Attending: Cardiology

## 2014-11-26 ENCOUNTER — Ambulatory Visit (HOSPITAL_COMMUNITY)
Admission: RE | Admit: 2014-11-26 | Discharge: 2014-11-26 | Disposition: A | Discharge status: Home / Self Care | Payer: BLUE CROSS/BLUE SHIELD | Source: Ambulatory Visit | Attending: Cardiology | Admitting: Cardiology

## 2014-11-26 ENCOUNTER — Ambulatory Visit (HOSPITAL_BASED_OUTPATIENT_CLINIC_OR_DEPARTMENT_OTHER): Payer: BLUE CROSS/BLUE SHIELD

## 2014-11-26 ENCOUNTER — Encounter (HOSPITAL_COMMUNITY): Payer: Self-pay | Admitting: *Deleted

## 2014-11-26 DIAGNOSIS — F419 Anxiety disorder, unspecified: Secondary | ICD-10-CM | POA: Insufficient documentation

## 2014-11-26 DIAGNOSIS — Z79899 Other long term (current) drug therapy: Secondary | ICD-10-CM | POA: Insufficient documentation

## 2014-11-26 DIAGNOSIS — Z01810 Encounter for preprocedural cardiovascular examination: Secondary | ICD-10-CM | POA: Insufficient documentation

## 2014-11-26 DIAGNOSIS — I4891 Unspecified atrial fibrillation: Secondary | ICD-10-CM

## 2014-11-26 DIAGNOSIS — E785 Hyperlipidemia, unspecified: Secondary | ICD-10-CM | POA: Insufficient documentation

## 2014-11-26 DIAGNOSIS — I10 Essential (primary) hypertension: Secondary | ICD-10-CM | POA: Insufficient documentation

## 2014-11-26 DIAGNOSIS — I480 Paroxysmal atrial fibrillation: Secondary | ICD-10-CM

## 2014-11-26 HISTORY — PX: CARDIOVERSION: SHX1299

## 2014-11-26 HISTORY — PX: TEE WITHOUT CARDIOVERSION: SHX5443

## 2014-11-26 SURGERY — CARDIOVERSION
Anesthesia: Monitor Anesthesia Care

## 2014-11-26 SURGERY — Surgical Case
Anesthesia: *Unknown

## 2014-11-26 MED ORDER — PROPOFOL 500 MG/50ML IV EMUL
INTRAVENOUS | Status: DC | PRN
  Administered 2014-11-26: 13:00:00 via INTRAVENOUS
  Administered 2014-11-26: 150 ug/kg/min via INTRAVENOUS

## 2014-11-26 MED ORDER — FENTANYL CITRATE (PF) 100 MCG/2ML IJ SOLN
25.0000 ug | INTRAMUSCULAR | Status: AC
  Administered 2014-11-26 (×2): 25 ug via INTRAVENOUS

## 2014-11-26 MED ORDER — PROPOFOL 10 MG/ML IV BOLUS
INTRAVENOUS | Status: AC
  Filled 2014-11-26: qty 20

## 2014-11-26 MED ORDER — MIDAZOLAM HCL 2 MG/2ML IJ SOLN
1.0000 mg | INTRAMUSCULAR | Status: DC | PRN
  Administered 2014-11-26: 2 mg via INTRAVENOUS

## 2014-11-26 MED ORDER — FENTANYL CITRATE (PF) 100 MCG/2ML IJ SOLN
INTRAMUSCULAR | Status: AC
  Filled 2014-11-26: qty 2

## 2014-11-26 MED ORDER — LIDOCAINE VISCOUS 2 % MT SOLN
OROMUCOSAL | Status: AC
  Filled 2014-11-26: qty 15

## 2014-11-26 MED ORDER — SODIUM CHLORIDE 0.9 % IV SOLN: INTRAVENOUS | Status: DC

## 2014-11-26 MED ORDER — MIDAZOLAM HCL 2 MG/2ML IJ SOLN
INTRAMUSCULAR | Status: AC
  Filled 2014-11-26: qty 4

## 2014-11-26 MED ORDER — LACTATED RINGERS IV SOLN
INTRAVENOUS | Status: DC
  Administered 2014-11-26 (×2): via INTRAVENOUS

## 2014-11-26 MED ORDER — FENTANYL CITRATE (PF) 100 MCG/2ML IJ SOLN: 25.0000 ug | INTRAMUSCULAR | Status: DC | PRN

## 2014-11-26 MED ORDER — ONDANSETRON HCL 4 MG/2ML IJ SOLN: 4.0000 mg | Freq: Once | INTRAMUSCULAR | Status: DC | PRN

## 2014-11-26 MED ORDER — MIDAZOLAM HCL 2 MG/2ML IJ SOLN
INTRAMUSCULAR | Status: AC
  Filled 2014-11-26: qty 2

## 2014-11-26 MED ORDER — MIDAZOLAM HCL 5 MG/5ML IJ SOLN
INTRAMUSCULAR | Status: DC | PRN
  Administered 2014-11-26: 2 mg via INTRAVENOUS

## 2014-11-26 MED ORDER — LIDOCAINE VISCOUS 2 % MT SOLN
15.0000 mL | Freq: Once | OROMUCOSAL | Status: AC
  Administered 2014-11-26: 3 mL via OROMUCOSAL

## 2014-11-26 NOTE — CV Procedure (Signed)
Procedure: TEE/DCCV Physician: Dr [name] MD Indication: afib   The patient was brought to the procedure suite after consent was obtained. The posterior oropharynx was anesthesized with 2% viscous lidocaine and cetacaine spray. Sedation was achieved with the assistance of anestheiology, please refer to there note for details. A bite guard was placed, cardiopulmonary monitoring was performed throughout the study. The TEE probe was intubated into the esophagus without trouble. The TEE test was intermittently interupted by apneic episodes, and thus a focused exam was performed. For full findings please refer to TEE report. There was no evidence of intracardiac thrombus. Definitiy echocontrast was used to better visualize the appendage.    After TEE was completed the patient was positioned for DCCV. A single synchronized 200J shock was delivered and succesfully converted patient from afib to NSR. He tolerated the procedure well.   [name] MD

## 2014-11-26 NOTE — Anesthesia Postprocedure Evaluation (Signed)
  Anesthesia Post-op Note  Patient: Bradley Rodriguez  Procedure(s) Performed: Procedure(s): CARDIOVERSION (N/A) TRANSESOPHAGEAL ECHOCARDIOGRAM (TEE) WITH PROPOFOL (N/A)  Patient Location: PACU  Anesthesia Type:MAC  Level of Consciousness: awake, alert , oriented and patient cooperative  Airway and Oxygen Therapy: Patient Spontanous Breathing  Post-op Pain: none  Post-op Assessment: Post-op Vital signs reviewed, Patient's Cardiovascular Status Stable, Respiratory Function Stable, Patent Airway, No signs of Nausea or vomiting and Pain level controlled              Post-op Vital Signs: Reviewed and stable  Last Vitals:  Filed Vitals:   11/26/14 1210  BP: 106/68  Pulse:   Resp: 14    Complications: No apparent anesthesia complications

## 2014-11-26 NOTE — H&P (Signed)
Procedure H&P  Please refer to recent EP clinic note posted below for full medical history. Patient presents today for TEE/DCCV for symptomatic afib. Plan for procedure today with assistance with anesthesia for sedation.   [name] MD Cardiology Office Note Date: 11/23/2014  Patient ID: Bradley Rodriguez, Bradley Rodriguez 1960-05-10, MRN [id number] PCP: Gar Ponto, MD Cardiologist: Dr. Bronson Ing Electrophysiologist: Dr. Caryl Comes  Chief Complaint: Atrial fibrillation  History of Present Illness: [name] is a 54 y.o. male with history of HTN, PAFib, hyperlipidemia, Anxiety. He was in the ER 11/20/14 palpitations and concern for AF, he was given cardizem "pill in the pocket" to use and to f/u with his cardiologist who referred him to EP. He was seen by Dr. Caryl Comes, possibly 6 or 8 years ago, but no EP since that time. He denies any of CP or SOB with or without the AF, no dizziness, near syncope or syncope either. HE can tell the moment he goes into Af though and gives him profound anxiety and can feel his rhythm irregular.   His PAF goes back to 1996, has an exacerbation about every 2-3 years and has been cardioverted 3-4 times.  Very remotely he took Coumadin briefly for the purpose of CV and tolerated it well, he reports hematuria in 2014 with Eliquis and some kidney stones at the time. He recalls historically trying Flecainide pill in the pocket but this was unsuccessful for him.   Past Medical History  Diagnosis Date  . A-fib [facility])     DCCV 2009, 2012, 2014  . Hypertension   . Hyperlipidemia     patient denies this  . Anxiety   . IBS (irritable bowel syndrome)   . Normal cardiac stress test     Normal nuclear stress test , 2001  . HX: anticoagulation     very remotely and briefly on COumadin aound one of his CV, 2014 on Eliquis had hematuria with renal calculi and stopped  . History of kidney stones     Past Surgical History   Procedure Laterality Date  . Lithotripsy      for kidney stones 2002  . Knee arthroscopy Right   . Cardioversion N/A 10/15/2012    Procedure: CARDIOVERSION; Surgeon: Larey Dresser, MD; Location: [place]; Service: Cardiovascular; Laterality: N/A;  . Eye surgery Right     detached retina  . Vasectomy    . Cataract extraction w/phaco Right 01/18/2014    Procedure: CATARACT EXTRACTION PHACO AND INTRAOCULAR LENS PLACEMENT (IOC); Surgeon: Tonny Kashawna Manzer, MD; Location: [facility]; Service: Ophthalmology; Laterality: Right; CDE 9.95    Current Outpatient Prescriptions  Medication Sig Dispense Refill  . allopurinol (ZYLOPRIM) 300 MG tablet Take 2 tablets by mouth daily.  3  . amLODipine (NORVASC) 5 MG tablet Take 5 mg by mouth daily.  3  . aspirin 325 MG EC tablet Take 325 mg by mouth daily.    . calcium carbonate (CALCIUM 600) 600 MG TABS tablet Take 600 mg by mouth daily.     . chlorthalidone (HYGROTON) 25 MG tablet Take 1 tablet (25 mg total) by mouth daily. 30 tablet 0  . diltiazem (CARDIZEM) 30 MG tablet Take 1 tablet (30 mg total) by mouth 4 (four) times daily. 10 tablet 0  . losartan (COZAAR) 100 MG tablet Take 100 mg by mouth daily.    . metoprolol (LOPRESSOR) 50 MG tablet Take 100 mg by mouth daily.     . Turmeric 500 MG CAPS Take 1 capsule by mouth daily.    Marland Kitchen  Vitamin D, Ergocalciferol, (DRISDOL) 50000 UNITS CAPS capsule Take 50,000 Units by mouth every 7 (seven) days.      No current facility-administered medications for this visit.    Allergies: Ace inhibitors; Lisinopril; and Quinine derivatives   Social History: The patient  reports that he has never smoked. He has never used smokeless tobacco. He reports that he does not drink alcohol or use illicit drugs.   Family History: The patient's family history includes Hypertension in an other family member. Both parents had  PAF  ROS: Please see the history of present illness. All other systems are reviewed and otherwise negative.  PHYSICAL EXAM:  115/68, Weight 309lbs Well nourished, well developed, in no acute distress  HEENT: normocephalic, atraumatic  Neck: no JVD, carotid bruits or masses Cardiac: irreg-irreg; no significant murmurs, no rubs, or gallops Lungs: clear to auscultation bilaterally, no wheezing, rhonchi or rales  Abd: soft, nontender, + BS MS: no deformity or atrophy Ext: no edema  Skin: warm and dry, no rash Neuro: No gross deficits appreciated Psych: euthymic mood, full affect   EKG: Done today shows Afib, 92bpm EKG on 11/19/14: AFib, 87bpm EKG 10/18/14: SR  Recent Labs: [facility], [facility]: 05/28/14 HgbA1C 6.2 10/12/14: BUN/Creat 16/1.3 (calc Cr. Clearance = 127.28), H/H 14/41, plts 284 01/12/2014: BUN 18; Creatinine, Ser 1.13; Hemoglobin 13.5; Potassium 5.0; Sodium 137    Wt Readings from Last 3 Encounters:  11/23/14 309 lb (140.161 kg)  11/19/14 305 lb (138.347 kg)  10/19/14 310 lb (140.615 kg)     Other studies reviewed: ER record, cardiology office notes reveal: Echocardiogram performed on 10/10/12 at [facility] demonstrated normal left ventricular systolic function, LVEF 29-51%, normal left atrial size, and mild LVH.   ASSESSMENT AND PLAN:  1. PAF  Hx of DCCV 2009, 2012, 2014  Hx of being on Eliquis by records, stopped secondary to hematuria/renal calculi  CHADs2Vasc with DM is 2  Dr. Caryl Comes discussed at length his Chads vasc score, and if DM need to consider longterm anticoagulation.   Start Xarelto, the pt is counseled on the medicine, bleeding/signs of bleeding to notify us , and the importance of not missing any doses. 2. HTN  Appears well controlled 3. Per the patient about 35mo ago dx with "pre-DM"  On metformin, and has intentionally lost 50lbs with diet and exercise  The patient is seen in  conjunction with Dr. Caryl Comes, Plan of care is with Dr. Caryl Comes:  PLAN: He is taking ASA 81mg  daily, no known CAD/vascular disease, stop the ASA, start Xarelto 20mg  daily to start today, plan for TEE/DCCV on Friday given he is so symptomatic with anxiety. We will draw for CBC, BMET and TSH.  He will also stop the Metformin and we will get a Hgb A1c prior to his next visit at the end of November to further discuss anticoagulation pending his A1C result.   Disposition: F/u with EP APP in 3-4 weeks, sooner if needed.  Current medicines are reviewed at length with the patient today. The patient did not have any concerns regarding medicines.  Haywood Lasso, PA-C 11/23/2014 2:10 PM   [facility] [address] [place] [address] [phone] (office)  [phone] (fax)

## 2014-11-26 NOTE — Anesthesia Preprocedure Evaluation (Signed)
Anesthesia Evaluation  Patient identified by MRN, date of birth, ID band Patient awake    Reviewed: Allergy & Precautions, H&P , NPO status , Patient's Chart, lab work & pertinent test results  Airway Mallampati: II  TM Distance: >3 FB     Dental  (+) Teeth Intact   Pulmonary neg pulmonary ROS,    breath sounds clear to auscultation       Cardiovascular hypertension, Pt. on medications  Rhythm:Regular Rate:Normal     Neuro/Psych PSYCHIATRIC DISORDERS Anxiety    GI/Hepatic negative GI ROS,   Endo/Other  Diabetes: new onset.  Renal/GU      Musculoskeletal   Abdominal   Peds  Hematology   Anesthesia Other Findings   Reproductive/Obstetrics                             Anesthesia Physical Anesthesia Plan  ASA: III  Anesthesia Plan: MAC   Post-op Pain Management:    Induction: Intravenous  Airway Management Planned: Simple Face Mask  Additional Equipment:   Intra-op Plan:   Post-operative Plan:   Informed Consent: I have reviewed the patients History and Physical, chart, labs and discussed the procedure including the risks, benefits and alternatives for the proposed anesthesia with the patient or authorized representative who has indicated his/her understanding and acceptance.     Plan Discussed with:   Anesthesia Plan Comments:         Anesthesia Quick Evaluation

## 2014-11-26 NOTE — Discharge Instructions (Signed)
Electrical Cardioversion, Care After °Refer to this sheet in the next few weeks. These instructions provide you with information on caring for yourself after your procedure. Your health care provider may also give you more specific instructions. Your treatment has been planned according to current medical practices, but problems sometimes occur. Call your health care provider if you have any problems or questions after your procedure. °WHAT TO EXPECT AFTER THE PROCEDURE °After your procedure, it is typical to have the following sensations: °· Some redness on the skin where the shocks were delivered. If this is tender, a sunburn lotion or hydrocortisone cream may help. °· Possible return of an abnormal heart rhythm within hours or days after the procedure. °HOME CARE INSTRUCTIONS °· Take medicines only as directed by your health care provider. Be sure you understand how and when to take your medicine. °· Learn how to feel your pulse and check it often. °· Limit your activity for 48 hours after the procedure or as directed by your health care provider. °· Avoid or minimize caffeine and other stimulants as directed by your health care provider. °SEEK MEDICAL CARE IF: °· You feel like your heart is beating too fast or your pulse is not regular. °· You have any questions about your medicines. °· You have bleeding that will not stop. °SEEK IMMEDIATE MEDICAL CARE IF: °· You are dizzy or feel faint. °· It is hard to breathe or you feel short of breath. °· There is a change in discomfort in your chest. °· Your speech is slurred or you have trouble moving an arm or leg on one side of your body. °· You get a serious muscle cramp that does not go away. °· Your fingers or toes turn cold or blue. °  °This information is not intended to replace advice given to you by your health care provider. Make sure you discuss any questions you have with your health care provider. °  °Document Released: 10/29/2012 Document Revised: 01/29/2014  Document Reviewed: 10/29/2012 °Elsevier Interactive Patient Education ©2016 [employer] ° °

## 2014-11-26 NOTE — Progress Notes (Signed)
Electrical Cardioversion Procedure Note YOAV OKANE [id number] 1960-11-25  Procedure: Electrical Cardioversion Indications:  Atrial Fibrillation  Procedure Details Consent: Risks of procedure as well as the alternatives and risks of each were explained to the (patient/caregiver).  Consent for procedure obtained. Time Out: Verified patient identification, verified procedure, site/side was marked, verified correct patient position, special equipment/implants available, medications/allergies/relevent history reviewed, required imaging and test results available.  Performed  Patient placed on cardiac monitor, pulse oximetry, supplemental oxygen as necessary.  Sedation given: propofol Pacer pads placed anterior and posterior chest.  Cardioverted 1 time(s).  Cardioverted at [place].  Evaluation Findings: Post procedure EKG shows: NSR Complications: None Patient did tolerate procedure well.  Pt has some irritation (pinkness) at the site of both pads. Told to use hydrocortisone cream as needed.  Charm Barges S 11/26/2014, 2:00 PM

## 2014-11-26 NOTE — Addendum Note (Signed)
Addendum  created 11/26/14 1331 by Mickel Baas, CRNA   Modules edited: Anesthesia Events

## 2014-11-26 NOTE — Transfer of Care (Signed)
Immediate Anesthesia Transfer of Care Note  Patient: Bradley Rodriguez  Procedure(s) Performed: Procedure(s): CARDIOVERSION (N/A) TRANSESOPHAGEAL ECHOCARDIOGRAM (TEE) WITH PROPOFOL (N/A)  Patient Location: PACU  Anesthesia Type:MAC  Level of Consciousness: awake, alert , oriented and patient cooperative  Airway & Oxygen Therapy: Patient Spontanous Breathing  Post-op Assessment: Report given to RN and Post -op Vital signs reviewed and stable  Post vital signs: Reviewed and stable  Last Vitals:  Filed Vitals:   11/26/14 1210  BP: 106/68  Pulse:   Resp: 14    Complications: No apparent anesthesia complications

## 2014-11-29 ENCOUNTER — Encounter (HOSPITAL_COMMUNITY): Payer: Self-pay | Admitting: Cardiology

## 2014-11-30 ENCOUNTER — Other Ambulatory Visit (INDEPENDENT_AMBULATORY_CARE_PROVIDER_SITE_OTHER): Payer: BLUE CROSS/BLUE SHIELD | Admitting: *Deleted

## 2014-11-30 DIAGNOSIS — I480 Paroxysmal atrial fibrillation: Secondary | ICD-10-CM | POA: Diagnosis not present

## 2014-11-30 DIAGNOSIS — I10 Essential (primary) hypertension: Secondary | ICD-10-CM

## 2014-11-30 LAB — HEMOGLOBIN A1C
HEMOGLOBIN A1C: 5.8 % — AB (ref <5.7)
MEAN PLASMA GLUCOSE: 120 mg/dL — AB (ref <117)

## 2014-12-20 ENCOUNTER — Encounter: Payer: Self-pay | Admitting: Physician Assistant

## 2014-12-20 NOTE — Progress Notes (Signed)
Cardiology Office Note Date:  12/21/2014  Patient ID:  Bradley Rodriguez, Bradley Rodriguez 02/17/1960, MRN [id number] PCP:  Gar Ponto, MD  Cardiologist:  Dr. Bronson Ing Electrophysiologist: Dr. Caryl Comes   Chief Complaint:  F/u s/p TEE/DCCV 11/26/14  History of Present Illness: Bradley Rodriguez is a 54 y.o. male with history of HTN, PAFib, hyperlipidemia, Anxiety.  He was in the ER 11/20/14 palpitations and concern for AF, he was given cardizem "pill in the pocket" to use and to f/u with his cardiologist who referred him to EP.  He was seen here 11/23/14 with Dr. Caryl Comes and referred for TEE/CV.   His PAF goes back to 1996, has an exacerbation about every 2-3 years and has been cardioverted 3-4 times. Very remotely he took Coumadin briefly for the purpose of CV and tolerated it well, he reports hematuria in 2014 with Eliquis and some kidney stones at the time. He recalls historically trying Flecainide pill in the pocket but this was unsuccessful for him.  Since the CV he feels very well.  He has not had any further AFib, noting he is very sensitive to AF and knows exactly when it starts, no palpitations.  He denies any kind of CP or SOB, no DOE, sleeps well.  He has not had any hematuria or other bleeding with the Xarelto, but requests that he be resumed on his previous ASA that he had been doing for years.  Lengthy discussion today, as previously discussed with Dr. Caryl Comes, given his A1c off the metformin, he is not felt to have DM and his CHADS2Vasc score is one.  Discussed with the patient stroke risk, he prefers to come off the Xarelto and maintain ASA therapy only.     Past Medical History  Diagnosis Date  . A-fib [facility])     DCCV 2009, 2012, 2014, Nov 2016  . Hypertension   . Hyperlipidemia     patient denies this  . Anxiety   . IBS (irritable bowel syndrome)   . Normal cardiac stress test     Normal nuclear stress test , 2001  . HX: anticoagulation     very remotely and briefly on COumadin aound one of  his CV, 2014 on Eliquis had hematuria with renal calculi and stopped  . History of kidney stones     Past Surgical History  Procedure Laterality Date  . Lithotripsy      for kidney stones 2002  . Knee arthroscopy Right   . Cardioversion N/A 10/15/2012    Procedure: CARDIOVERSION;  Surgeon: Larey Dresser, MD;  Location: [place];  Service: Cardiovascular;  Laterality: N/A;  . Eye surgery Right     detached retina  . Vasectomy    . Cataract extraction w/phaco Right 01/18/2014    Procedure: CATARACT EXTRACTION PHACO AND INTRAOCULAR LENS PLACEMENT (IOC);  Surgeon: Tonny Branch, MD;  Location: [facility];  Service: Ophthalmology;  Laterality: Right;  CDE 9.95  . Cardioversion N/A 11/26/2014    Procedure: CARDIOVERSION;  Surgeon: Arnoldo Lenis, MD;  Location: [facility];  Service: Endoscopy;  Laterality: N/A;  . Tee without cardioversion N/A 11/26/2014    Procedure: TRANSESOPHAGEAL ECHOCARDIOGRAM (TEE) WITH PROPOFOL;  Surgeon: Arnoldo Lenis, MD;  Location: [facility];  Service: Endoscopy;  Laterality: N/A;    Current Outpatient Prescriptions  Medication Sig Dispense Refill  . allopurinol (ZYLOPRIM) 300 MG tablet Take 2 tablets by mouth daily.  3  . amLODipine (NORVASC) 5 MG tablet Take 5 mg by mouth daily.  3  . calcium carbonate (CALCIUM 600) 600 MG TABS tablet Take 600 mg by mouth daily.     . chlorthalidone (HYGROTON) 25 MG tablet Take 1 tablet (25 mg total) by mouth daily. 30 tablet 0  . losartan (COZAAR) 100 MG tablet Take 100 mg by mouth daily.    . metoprolol (LOPRESSOR) 50 MG tablet Take 100 mg by mouth daily.     . rivaroxaban (XARELTO) 20 MG TABS tablet Take 1 tablet (20 mg total) by mouth daily with supper. 30 tablet 6  . Vitamin D, Ergocalciferol, (DRISDOL) 50000 UNITS CAPS capsule Take 50,000 Units by mouth every 7 (seven) days.      No current facility-administered medications for this visit.    Allergies:   Ace inhibitors; Lisinopril; and Quinine derivatives   Social  History:  The patient  reports that he has never smoked. He has never used smokeless tobacco. He reports that he does not drink alcohol or use illicit drugs.   Family History:  The patient's family history includes Hypertension in an other family member. Both parents had PAF  ROS:  Please see the history of present illness. All other systems are reviewed and otherwise negative.  PHYSICAL EXAM:  122/74, weight 307lbs Well nourished, well developed, in no acute distress HEENT: normocephalic, atraumatic Neck: no JVD, carotid bruits or masses Cardiac:  irreg-irreg;  no significant murmurs, no rubs, or gallops Lungs:  clear to auscultation bilaterally, no wheezing, rhonchi or rales Abd: soft, nontender,  + BS MS: no deformity or atrophy Ext: no edema Skin: warm and dry, no rash Neuro:  No gross deficits appreciated Psych: euthymic mood, full affect   EKG:  EKG today is SB, 59 11/23/14:  Afib, 92bpm EKG on 11/19/14: AFib, 87bpm EKG 10/18/14: SR  Recent Labs: [facility], [facility]: 05/28/14 HgbA1C 6.2 10/12/14: BUN/Creat 16/1.3 (calc Cr. Clearance = 127.28), H/H 14/41, plts 284 11/23/2014: BUN 30*; Creat 1.19; Hemoglobin 13.9; Platelets 366; Potassium 4.1; Sodium 139; TSH 1.422  11/30/14 HGB A1c 5.8 (off Metformin)   Wt Readings from Last 3 Encounters:  12/21/14 307 lb (139.254 kg)  11/26/14 309 lb (140.161 kg)  11/23/14 309 lb (140.161 kg)     Other studies reviewed: 11/26/14: TEE Study Conclusions - Left ventricle: The cavity size was normal. Wall thickness was normal. Systolic function was normal. The estimated ejection fraction was in the range of 60% to 65%. No evidence of thrombus. - Mitral valve: There was mild regurgitation. The MR vena contracta is 0.2 cm. - Left atrium: The atrium was mildly dilated. No evidence of thrombus in the atrial cavity or appendage. No evidence of thrombus in the atrial cavity or appendage. No evidence of thrombus in the  appendage. The appendage was of normal size. Emptying velocity was normal at 51 cm/s. Echocontrast was used to better evaluate the appendage. - Right atrium: No evidence of thrombus in the atrial cavity or appendage. - Tricuspid valve: There was mild-moderate regurgitation. - This is a focused limited study due to recurrent apneic episodes with sedation. Impressions: - No intracardiac thrombus detected. Will proceed with direct current cardioversion.  ER record, cardiology office notes reveal: Echocardiogram performed on 10/10/12 at [facility] demonstrated normal left ventricular systolic function, LVEF [id number], normal left atrial size, and mild LVH.   ASSESSMENT AND PLAN:  1. PAF     Hx of DCCV 2009, 2012, 2014, and 11/26/14     Hx of being on Eliquis by records, stopped secondary to hematuria/renal calculi,  though none while on Xarelto     CHADs2Vasc with DM is 2, Hgb A1c 5.8 done off Metformin 1 week     I had dw Dr. Caryl Comes not felt to be DM, given this CHADS2Vasc of 1 no chronic full a/c, a discussion was held with the patient today, we will stop Xarelto and resume his ASA 81mg  daily as he was previously doing prior to his last AF episode      F/U with EP in 1 year, sooner if needed.  Should he have any recurrent AF he will notify us, and will resume Xarelto if recurrent.  2.   HTN      Appears well controlled  3.  Per the patient about 64mo ago dx with "pre-DM"      On metformin, and has intentionally lost 50lbs with diet and exercise      He is referred to his PMD to discuss his Metformin management      He is also recommended to have a sleep study done, he reports a negative study about 15 years ago, though given he had some apneic episodes with his sedation recommend he have another       The patient states he will call his PMD office today to make f/u appt, as soon as available to discuss these things.  PLAN: as above, he will f/u with Dr. Jacinta Shoe as directed  by him as well.  Current medicines are reviewed at length with the patient today.  The patient did not have any concerns regarding medicines.  Haywood Lasso, PA-C 12/21/2014 11:06 AM     [facility] [address] [place] [address] [phone] (office)  [phone] (fax)

## 2014-12-21 ENCOUNTER — Encounter: Payer: Self-pay | Admitting: Physician Assistant

## 2014-12-21 ENCOUNTER — Ambulatory Visit (INDEPENDENT_AMBULATORY_CARE_PROVIDER_SITE_OTHER): Payer: BLUE CROSS/BLUE SHIELD | Admitting: Physician Assistant

## 2014-12-21 VITALS — BP 122/74 | HR 60 | Ht 79.5 in | Wt 307.0 lb

## 2014-12-21 DIAGNOSIS — I480 Paroxysmal atrial fibrillation: Secondary | ICD-10-CM | POA: Diagnosis not present

## 2014-12-21 DIAGNOSIS — I10 Essential (primary) hypertension: Secondary | ICD-10-CM

## 2014-12-21 NOTE — Patient Instructions (Addendum)
Medication Instructions:   STOP TAKING XARELTO  START BACK TAKING ASPIRIN 325 MG ONCE A DAY    If you need a refill on your cardiac medications before your next appointment, please call your pharmacy.  Labwork: NONE ORDER TODAY    Testing/Procedures:  NONE ORDER TODAY    Follow-Up: Your physician wants you to follow-up in: [place] will receive a reminder letter in the mail two months in advance. If you don't receive a letter, please call our office to schedule the follow-up appointment.     Any Other Special Instructions Will Be Listed Below (If Applicable).  FOLLOW UP WITH PRIMARY CARE DOCTER ABOUT METFORMIN AND SLEEP STUDY

## 2014-12-22 NOTE — Addendum Note (Signed)
Addended by: Claude Manges on: 12/22/2014 07:30 AM   Modules accepted: Orders, Medications

## 2014-12-23 ENCOUNTER — Ambulatory Visit: Payer: BLUE CROSS/BLUE SHIELD | Admitting: Physician Assistant

## 2014-12-31 ENCOUNTER — Other Ambulatory Visit: Payer: Self-pay | Admitting: *Deleted

## 2014-12-31 NOTE — Addendum Note (Signed)
Addended by: Claude Manges on: 12/31/2014 08:12 AM   Modules accepted: Orders

## 2015-02-24 ENCOUNTER — Encounter: Payer: Self-pay | Admitting: Cardiology

## 2015-02-24 ENCOUNTER — Ambulatory Visit (INDEPENDENT_AMBULATORY_CARE_PROVIDER_SITE_OTHER): Payer: BLUE CROSS/BLUE SHIELD | Admitting: Cardiology

## 2015-02-24 VITALS — BP 126/77 | HR 75 | Ht 79.5 in | Wt 308.0 lb

## 2015-02-24 DIAGNOSIS — I10 Essential (primary) hypertension: Secondary | ICD-10-CM | POA: Diagnosis not present

## 2015-02-24 DIAGNOSIS — I480 Paroxysmal atrial fibrillation: Secondary | ICD-10-CM | POA: Diagnosis not present

## 2015-02-24 DIAGNOSIS — R0789 Other chest pain: Secondary | ICD-10-CM | POA: Diagnosis not present

## 2015-02-24 NOTE — Progress Notes (Signed)
Patient ID: Bradley Rodriguez, male   DOB: 1960/07/25, 55 y.o.   MRN: [id number]     Clinical Summary Bradley Rodriguez is a 55 y.o.male former patient of Dr Ron Parker, he was last seen by Dr Bronson Ing. He is added on to my schedule due to recent symptoms of chest discomfort.  1. Chest discomfort - intermittent left arm tingling x 1 week. Better with straightening out of elbow, symptoms are positional.  - also with fullness in chest over the last 2 nights. Fullness in left chest and into rib cage. No other associated symptoms. Can be positional. Better with prn ativan. Reports similar symptoms in the past associated with stress. Reports recent increased stress with family and employment issues. Can ride stationary bike x 15 minutes without troubles.    2. Afib - multiple previous DCCVs, last 11/2014. Has maintained normal sinus rhythm since that time - denies any recent palpitations   3. HTN - compliant with meds    Past Medical History  Diagnosis Date  . A-fib [facility])     DCCV 2009, 2012, 2014, Nov 2016  . Hypertension   . Hyperlipidemia     patient denies this  . Anxiety   . IBS (irritable bowel syndrome)   . Normal cardiac stress test     Normal nuclear stress test , 2001  . HX: anticoagulation     very remotely and briefly on COumadin aound one of his CV, 2014 on Eliquis had hematuria with renal calculi and stopped  . History of kidney stones      Allergies  Allergen Reactions  . Ace Inhibitors Cough  . Lisinopril Cough  . Quinine Derivatives Rash     Current Outpatient Prescriptions  Medication Sig Dispense Refill  . allopurinol (ZYLOPRIM) 300 MG tablet Take 2 tablets by mouth daily.  3  . amLODipine (NORVASC) 5 MG tablet Take 5 mg by mouth daily.  3  . aspirin 81 MG tablet Take 81 mg by mouth daily.    . calcium carbonate (CALCIUM 600) 600 MG TABS tablet Take 600 mg by mouth daily.     . chlorthalidone (HYGROTON) 25 MG tablet Take 1 tablet (25 mg total) by mouth daily. 30  tablet 0  . losartan (COZAAR) 100 MG tablet Take 100 mg by mouth daily.    . metoprolol (LOPRESSOR) 50 MG tablet Take 100 mg by mouth daily.     . Vitamin D, Ergocalciferol, (DRISDOL) 50000 UNITS CAPS capsule Take 50,000 Units by mouth every 7 (seven) days.      No current facility-administered medications for this visit.     Past Surgical History  Procedure Laterality Date  . Lithotripsy      for kidney stones 2002  . Knee arthroscopy Right   . Cardioversion N/A 10/15/2012    Procedure: CARDIOVERSION;  Surgeon: Larey Dresser, MD;  Location: [facility];  Service: Cardiovascular;  Laterality: N/A;  . Eye surgery Right     detached retina  . Vasectomy    . Cataract extraction w/phaco Right 01/18/2014    Procedure: CATARACT EXTRACTION PHACO AND INTRAOCULAR LENS PLACEMENT (IOC);  Surgeon: Tonny Emersen Carroll, MD;  Location: [facility];  Service: Ophthalmology;  Laterality: Right;  CDE 9.95  . Cardioversion N/A 11/26/2014    Procedure: CARDIOVERSION;  Surgeon: Arnoldo Lenis, MD;  Location: [facility];  Service: Endoscopy;  Laterality: N/A;  . Tee without cardioversion N/A 11/26/2014    Procedure: TRANSESOPHAGEAL ECHOCARDIOGRAM (TEE) WITH PROPOFOL;  Surgeon: Arnoldo Lenis, MD;  Location: [facility];  Service: Endoscopy;  Laterality: N/A;     Allergies  Allergen Reactions  . Ace Inhibitors Cough  . Lisinopril Cough  . Quinine Derivatives Rash      Family History  Problem Relation Age of Onset  . Hypertension       Social History Bradley Rodriguez reports that he has never smoked. He has never used smokeless tobacco. Bradley Rodriguez reports that he does not drink alcohol.   Review of Systems CONSTITUTIONAL: No weight loss, fever, chills, weakness or fatigue.  HEENT: Eyes: No visual loss, blurred vision, double vision or yellow sclerae.No hearing loss, sneezing, congestion, runny nose or sore throat.  SKIN: No rash or itching.  CARDIOVASCULAR: per HPI RESPIRATORY: No shortness of breath, cough or  sputum.  GASTROINTESTINAL: No anorexia, nausea, vomiting or diarrhea. No abdominal pain or blood.  GENITOURINARY: No burning on urination, no polyuria NEUROLOGICAL: No headache, dizziness, syncope, paralysis, ataxia, numbness or tingling in the extremities. No change in bowel or bladder control.  MUSCULOSKELETAL: No muscle, back pain, joint pain or stiffness.  LYMPHATICS: No enlarged nodes. No history of splenectomy.  PSYCHIATRIC: No history of depression or anxiety.  ENDOCRINOLOGIC: No reports of sweating, cold or heat intolerance. No polyuria or polydipsia.  Marland Kitchen   Physical Examination Filed Vitals:   02/24/15 1419  BP: 126/77  Pulse: 75   Filed Vitals:   02/24/15 1419  Height: 6' 7.5" (2.019 m)  Weight: 308 lb (139.708 kg)    Gen: resting comfortably, no acute distress HEENT: no scleral icterus, pupils equal round and reactive, no palptable cervical adenopathy,  CV: RRR, no m/r/g, no jvd Resp: Clear to auscultation bilaterally GI: abdomen is soft, non-tender, non-distended, normal bowel sounds, no hepatosplenomegaly MSK: extremities are warm, no edema.  Skin: warm, no rash Neuro:  no focal deficits Psych: appropriate affect   Diagnostic Studies 02/24/15 Clinic EKG (performed and reviewed in clinic): NSR    Assessment and Plan  1. Chest pain - atypical symptoms, EKG in clinic NSR without ischemic changes - symptoms are positional, brought on by stress and better with prn ativan - no indication for cardiac testing at this time  2 Afib - maintaining sinus rhythm, no recent symptoms Continue current meds  3. HTN - at goal, continue current meds       Arnoldo Lenis, M.D.

## 2015-02-24 NOTE — Patient Instructions (Signed)
Your physician wants you to follow-up in: 4 months with Dr. Koneswaran. You will receive a reminder letter in the mail two months in advance. If you don't receive a letter, please call our office to schedule the follow-up appointment.  Your physician recommends that you continue on your current medications as directed. Please refer to the Current Medication list given to you today.  Thank you for choosing [facility]!!    

## 2015-06-28 ENCOUNTER — Ambulatory Visit: Payer: BLUE CROSS/BLUE SHIELD | Admitting: Cardiovascular Disease

## 2015-07-07 ENCOUNTER — Ambulatory Visit: Payer: BLUE CROSS/BLUE SHIELD | Admitting: Cardiology

## 2015-07-08 ENCOUNTER — Ambulatory Visit: Payer: BLUE CROSS/BLUE SHIELD | Admitting: Cardiovascular Disease

## 2015-11-08 ENCOUNTER — Emergency Department (HOSPITAL_COMMUNITY)
Admission: EM | Admit: 2015-11-08 | Discharge: 2015-11-09 | Disposition: A | Discharge status: Home / Self Care | Payer: BLUE CROSS/BLUE SHIELD | Attending: Emergency Medicine | Admitting: Emergency Medicine

## 2015-11-08 ENCOUNTER — Emergency Department (HOSPITAL_COMMUNITY): Payer: BLUE CROSS/BLUE SHIELD

## 2015-11-08 ENCOUNTER — Encounter (HOSPITAL_COMMUNITY): Payer: Self-pay | Admitting: *Deleted

## 2015-11-08 DIAGNOSIS — Z7984 Long term (current) use of oral hypoglycemic drugs: Secondary | ICD-10-CM | POA: Insufficient documentation

## 2015-11-08 DIAGNOSIS — I10 Essential (primary) hypertension: Secondary | ICD-10-CM | POA: Insufficient documentation

## 2015-11-08 DIAGNOSIS — Z79899 Other long term (current) drug therapy: Secondary | ICD-10-CM | POA: Insufficient documentation

## 2015-11-08 DIAGNOSIS — I480 Paroxysmal atrial fibrillation: Secondary | ICD-10-CM

## 2015-11-08 LAB — CBC
HEMATOCRIT: 41.9 % (ref 39.0–52.0)
HEMOGLOBIN: 14.6 g/dL (ref 13.0–17.0)
MCH: 30.7 pg (ref 26.0–34.0)
MCHC: 34.8 g/dL (ref 30.0–36.0)
MCV: 88 fL (ref 78.0–100.0)
Platelets: 217 10*3/uL (ref 150–400)
RBC: 4.76 MIL/uL (ref 4.22–5.81)
RDW: 13.5 % (ref 11.5–15.5)
WBC: 6.1 10*3/uL (ref 4.0–10.5)

## 2015-11-08 NOTE — ED Triage Notes (Signed)
Pt states he feels like his heart is in afib; pt denies any chest pain; pt has hx of previous cardioversions, with last one in 2016

## 2015-11-08 NOTE — ED Provider Notes (Signed)
[facility] DEPT Provider Note   CSN: [id number] Arrival date & time: 11/08/15  2306  By signing my name below, I, Jeanell Sparrow, attest that this documentation has been prepared under the direction and in the presence of Rolland Porter, MD . Electronically Signed: Jeanell Sparrow, Scribe. 11/08/2015. 12:06 AM.  Time Seen 12:02 AM  History   Chief Complaint Chief Complaint  Patient presents with  . Palpitations   The history is provided by the patient. No language interpreter was used.   HPI Comments: RAKIEM FACTOR is a 55 y.o. male with a hx of heart palpitations from PAF who presents to the Emergency Department complaining of heart palpitations that started about 2 hours ago at 10:05 PM. He reports no modifying factors. He states that he was recently taken off his Xarelto that he was put on temporarily with his last cardioversion in November. Pt denies any hx of smoking, recent alcohol use, chest pain, SOB, diaphoresis, or other complaints. He reports the last 3 episodes have been after eating [place] food, although he has eaten [place] food other times without incident. He only uses hot Duck sauce.   PCP Dr Dub [facility] Dr Harl Bowie  Past Medical History:  Diagnosis Date  . A-fib [facility])    DCCV 2009, 2012, 2014, Nov 2016  . Anxiety   . History of kidney stones   . HX: anticoagulation    very remotely and briefly on COumadin aound one of his CV, 2014 on Eliquis had hematuria with renal calculi and stopped  . Hyperlipidemia    patient denies this  . Hypertension   . IBS (irritable bowel syndrome)   . Normal cardiac stress test    Normal nuclear stress test , 2001    Patient Active Problem List   Diagnosis Date Noted  . Ejection fraction   . Normal cardiac stress test   . HX: anticoagulation   . S/P vasectomy   . A-fib ([place])   . Hyperlipidemia   . Anxiety   . IBS (irritable bowel syndrome)     Past Surgical History:  Procedure Laterality Date  . CARDIOVERSION N/A  10/15/2012   Procedure: CARDIOVERSION;  Surgeon: Larey Dresser, MD;  Location: [facility] ENDOSCOPY;  Service: Cardiovascular;  Laterality: N/A;  . CARDIOVERSION N/A 11/26/2014   Procedure: CARDIOVERSION;  Surgeon: Arnoldo Lenis, MD;  Location[facility];  Service: Endoscopy;  Laterality: N/A;  . CATARACT EXTRACTION W/PHACO Right 01/18/2014   Procedure: CATARACT EXTRACTION PHACO AND INTRAOCULAR LENS PLACEMENT (IOC);  Surgeon: Tonny Branch, MD;  Location: [facility];  Service: Ophthalmology;  Laterality: Right;  CDE 9.95  . EYE SURGERY Right    detached retina  . FOOT SURGERY Left   . KNEE ARTHROSCOPY Right   . LITHOTRIPSY     for kidney stones 2002  . TEE WITHOUT CARDIOVERSION N/A 11/26/2014   Procedure: TRANSESOPHAGEAL ECHOCARDIOGRAM (TEE) WITH PROPOFOL;  Surgeon: Arnoldo Lenis, MD;  Location: [facility];  Service: Endoscopy;  Laterality: N/A;  . VASECTOMY         Home Medications    Prior to Admission medications   Medication Sig Start Date End Date Taking? Authorizing Provider  allopurinol (ZYLOPRIM) 300 MG tablet Take 2 tablets by mouth daily. 09/29/14  Yes Historical Provider, MD  amLODipine (NORVASC) 5 MG tablet Take 5 mg by mouth daily. 09/29/14  Yes Historical Provider, MD  calcium carbonate (CALCIUM 600) 600 MG TABS tablet Take 600 mg by mouth daily.    Yes Historical  Provider, MD  LORazepam (ATIVAN) 0.5 MG tablet Take 0.5 mg by mouth 3 (three) times daily as needed. 12/23/14  Yes Historical Provider, MD  losartan (COZAAR) 100 MG tablet Take 100 mg by mouth daily.   Yes Historical Provider, MD  metFORMIN (GLUCOPHAGE-XR) 500 MG 24 hr tablet Take 1 tablet by mouth daily. 01/03/15  Yes Historical Provider, MD  metoprolol (LOPRESSOR) 50 MG tablet Take 100 mg by mouth daily.    Yes Historical Provider, MD  Vitamin D, Ergocalciferol, (DRISDOL) 50000 UNITS CAPS capsule Take 50,000 Units by mouth every 7 (seven) days.  10/27/14  Yes Historical Provider, MD    Family History Family History  Problem  Relation Age of Onset  . Hypertension      Social History Social History  Substance Use Topics  . Smoking status: Never Smoker  . Smokeless tobacco: Never Used  . Alcohol use No  unemployed   Allergies   Ace inhibitors; Lisinopril; and Quinine derivatives   Review of Systems Review of Systems  Constitutional: Negative for diaphoresis.  Respiratory: Negative for shortness of breath.   Cardiovascular: Positive for palpitations. Negative for chest pain.  Hematological: Does not bruise/bleed easily.  All other systems reviewed and are negative.    Physical Exam Updated Vital Signs BP 135/94 (BP Location: Left Arm)   Pulse 103   Temp 98.1 F (36.7 C) (Oral)   Resp 16   Ht 6\' 8"  (2.032 m)   Wt (!) 310 lb (140.6 kg)   SpO2 97%   BMI 34.06 kg/m   Vital signs normal   Patient noted to be in atrial fibrillation heart rate 99-100, when he sat up his heart rate did jump up to 117 and then quickly came back down to 99.  Physical Exam  Constitutional: He is oriented to person, place, and time. He appears well-developed and well-nourished.  Non-toxic appearance. He does not appear ill. No distress.  HENT:  Head: Normocephalic and atraumatic.  Right Ear: External ear normal.  Left Ear: External ear normal.  Nose: Nose normal. No mucosal edema or rhinorrhea.  Mouth/Throat: Oropharynx is clear and moist and mucous membranes are normal. No dental abscesses or uvula swelling.  Eyes: Conjunctivae and EOM are normal. Pupils are equal, round, and reactive to light.  Neck: Normal range of motion and full passive range of motion without pain. Neck supple.  Cardiovascular: Normal rate and normal heart sounds.  An irregularly irregular rhythm present. Exam reveals no gallop and no friction rub.   No murmur heard. Pulmonary/Chest: Effort normal and breath sounds normal. No respiratory distress. He has no wheezes. He has no rhonchi. He has no rales. He exhibits no tenderness and no  crepitus.  Abdominal: Soft. Normal appearance and bowel sounds are normal. He exhibits no distension. There is no tenderness. There is no rebound and no guarding.  Musculoskeletal: Normal range of motion. He exhibits no edema or tenderness.  Moves all extremities well.   Neurological: He is alert and oriented to person, place, and time. He has normal strength. No cranial nerve deficit.  Skin: Skin is warm, dry and intact. No rash noted. No erythema. No pallor.  Psychiatric: He has a normal mood and affect. His speech is normal and behavior is normal. His mood appears not anxious.  Nursing note and vitals reviewed.    ED Treatments / Results  DIAGNOSTIC STUDIES: Oxygen Saturation is 97% on RA, normal by my interpretation.    Labs (all labs ordered are listed, but  only abnormal results are displayed) Results for orders placed or performed during the hospital encounter of [id number]  Basic metabolic panel  Result Value Ref Range   Sodium 136 135 - 145 mmol/L   Potassium 3.7 3.5 - 5.1 mmol/L   Chloride 100 (L) 101 - 111 mmol/L   CO2 25 22 - 32 mmol/L   Glucose, Bld 119 (H) 65 - 99 mg/dL   BUN 26 (H) 6 - 20 mg/dL   Creatinine, Ser 1.25 (H) 0.61 - 1.24 mg/dL   Calcium 9.3 8.9 - 10.3 mg/dL   GFR calc non Af Amer >60 >60 mL/min   GFR calc Af Amer >60 >60 mL/min   Anion gap 11 5 - 15  CBC  Result Value Ref Range   WBC 6.1 4.0 - 10.5 K/uL   RBC 4.76 4.22 - 5.81 MIL/uL   Hemoglobin 14.6 13.0 - 17.0 g/dL   HCT 41.9 39.0 - 52.0 %   MCV 88.0 78.0 - 100.0 fL   MCH 30.7 26.0 - 34.0 pg   MCHC 34.8 30.0 - 36.0 g/dL   RDW 13.5 11.5 - 15.5 %   Platelets 217 150 - 400 K/uL   Laboratory interpretation all normal except renal insufficiency, hyperglycemia    EKG  EKG Interpretation  Date/Time:  Tuesday November 08 2015 23:20:38 EDT Ventricular Rate:  98 PR Interval:    QRS Duration: 99 QT Interval:  342 QTC Calculation: 437 R Axis:   60 Text Interpretation:  Atrial fibrillation Baseline  wander in lead(s) I II aVR Since last tracing 26 Nov 2014 Atrial fibrillation has replaced Sinus bradycardia Confirmed by Fareeda Downard  MD-I, Ahlam Piscitelli ([id number]) on 11/08/2015 11:54:02 PM       Radiology Dg Chest 2 View  Result Date: 11/09/2015 CLINICAL DATA:  Atrial fibrillation.  History of cardioversion x2. EXAM: CHEST  2 VIEW COMPARISON:  05/01/2009 FINDINGS: The cardiac silhouette is top-normal in size. The aorta is not aneurysmal. The lungs are clear. No acute osseous abnormality. No pneumothorax or effusion. IMPRESSION: No active cardiopulmonary disease. Electronically Signed   By: Ashley Royalty M.D.   On: 11/09/2015 00:45    Procedures Procedures (including critical care time)  Medications Ordered in ED Medications  metoprolol (LOPRESSOR) tablet 50 mg (not administered)  metoprolol (LOPRESSOR) injection 5 mg (5 mg Intravenous Given 11/09/15 0027)  flecainide (TAMBOCOR) tablet 300 mg (300 mg Oral Given 11/09/15 0059)     Initial Impression / Assessment and Plan / ED Course  I have reviewed the triage vital signs and the nursing notes.  Pertinent labs & imaging results that were available during my care of the patient were reviewed by me and considered in my medical decision making (see chart for details).  Clinical Course     COORDINATION OF CARE: 12:10 PM- Pt advised of plan for treatment which includes medication, radiology, and labs, and pt agrees.  12:26 PM  Dr Harrington Challenger, discussed patient and reviewed his clinic charts, States to give flecainide 300 mg orally and observe for couple hours. If he converts he can go home and continue on his aspirin, if he does not convert but remains pain-free and comfortable he can go home with close follow-up, however if he continues in atrial fibrillation and gets uncomfortable he should be admitted to observation.  12:45 AM Pt given the plan as discussed with Dr Harrington Challenger. His heart rate now is in the 80's. He still denies chest pain, shortness of breath or  feeling bad.   Recheck 01:30  AM HR 70-90's. Still feels fine. Will observe another 30-60 minutes, got his medication around 12:30.   02:30 AM heart rate 90-100, still in a fib,  given oral metoprolol. Pt states he also has diltiazem at home but it doesn't work either. He was discharged home to follow up with cardiology today for probable cardioversion.   Final Clinical Impressions(s) / ED Diagnoses   Final diagnoses:  PAF (paroxysmal atrial fibrillation) ([place])    Plan discharge  Rolland Porter, MD, FACEP    I personally performed the services described in this documentation, which was scribed in my presence. The recorded information has been reviewed and considered.  Rolland Porter, MD, Barbette Or, MD 11/09/15 [phone]

## 2015-11-09 ENCOUNTER — Telehealth: Payer: Self-pay | Admitting: Cardiology

## 2015-11-09 ENCOUNTER — Ambulatory Visit (INDEPENDENT_AMBULATORY_CARE_PROVIDER_SITE_OTHER): Payer: Self-pay | Admitting: Cardiovascular Disease

## 2015-11-09 ENCOUNTER — Encounter: Payer: Self-pay | Admitting: Cardiovascular Disease

## 2015-11-09 VITALS — BP 112/78 | HR 97 | Ht >= 80 in | Wt 323.0 lb

## 2015-11-09 DIAGNOSIS — Z7189 Other specified counseling: Secondary | ICD-10-CM

## 2015-11-09 DIAGNOSIS — Z9289 Personal history of other medical treatment: Secondary | ICD-10-CM

## 2015-11-09 DIAGNOSIS — I4891 Unspecified atrial fibrillation: Secondary | ICD-10-CM

## 2015-11-09 DIAGNOSIS — I10 Essential (primary) hypertension: Secondary | ICD-10-CM

## 2015-11-09 LAB — BASIC METABOLIC PANEL
Anion gap: 11 (ref 5–15)
BUN: 26 mg/dL — ABNORMAL HIGH (ref 6–20)
CALCIUM: 9.3 mg/dL (ref 8.9–10.3)
CO2: 25 mmol/L (ref 22–32)
CREATININE: 1.25 mg/dL — AB (ref 0.61–1.24)
Chloride: 100 mmol/L — ABNORMAL LOW (ref 101–111)
GFR calc Af Amer: >60 mL/min (ref >60)
GLUCOSE: 119 mg/dL — AB (ref 65–99)
Potassium: 3.7 mmol/L (ref 3.5–5.1)
Sodium: 136 mmol/L (ref 135–145)

## 2015-11-09 MED ORDER — FLECAINIDE ACETATE 100 MG PO TABS
ORAL_TABLET | ORAL | Status: AC
  Filled 2015-11-09: qty 3

## 2015-11-09 MED ORDER — METOPROLOL TARTRATE 50 MG PO TABS
50.0000 mg | ORAL_TABLET | Freq: Once | ORAL | Status: AC
  Administered 2015-11-09: 50 mg via ORAL
  Filled 2015-11-09: qty 1

## 2015-11-09 MED ORDER — RIVAROXABAN 20 MG PO TABS: 20.0000 mg | ORAL_TABLET | Freq: Every day | ORAL | 6 refills | Status: DC

## 2015-11-09 MED ORDER — METOPROLOL TARTRATE 5 MG/5ML IV SOLN
5.0000 mg | Freq: Once | INTRAVENOUS | Status: AC
  Administered 2015-11-09: 5 mg via INTRAVENOUS
  Filled 2015-11-09: qty 5

## 2015-11-09 MED ORDER — FLECAINIDE ACETATE 100 MG PO TABS
300.0000 mg | ORAL_TABLET | Freq: Once | ORAL | Status: AC
Start: 2015-11-09 — End: 2015-11-09
  Administered 2015-11-09: 300 mg via ORAL
  Filled 2015-11-09: qty 3

## 2015-11-09 MED ORDER — METOPROLOL TARTRATE 100 MG PO TABS: 100.0000 mg | ORAL_TABLET | Freq: Two times a day (BID) | ORAL | 3 refills | Status: DC

## 2015-11-09 NOTE — Telephone Encounter (Signed)
Dr. Bronson Ing pt will forward

## 2015-11-09 NOTE — Telephone Encounter (Signed)
Bradley Rodriguez was seen in the ER at [facility] last evening for atrial fib. States that he was told to see cardiology doctor today to set up a cardioversion.

## 2015-11-09 NOTE — Patient Instructions (Addendum)
Medication Instructions:  INCREASE METOPROLOL TO 100 MG - TWO TIMES DAILY  Start xarelto 20 mg daily   Labwork: NONE  Testing/Procedures: NONE  Follow-Up: Your physician recommends that you schedule a follow-up appointment in: A FIB CLINIC- ASAP / FOLLOW UP WITH DR. Bronson Ing AS NEEDED  Any Other Special Instructions Will Be Listed Below (If Applicable).  APPOINTMENT WITH A-FIB CLINIC ON Friday October 20th @ 11:30    If you need a refill on your cardiac medications before your next appointment, please call your pharmacy.

## 2015-11-09 NOTE — Telephone Encounter (Signed)
Will be evaluated in clinic today. I would like for him to be seen in a fib clinic. Please arrange

## 2015-11-09 NOTE — Telephone Encounter (Signed)
Pt was seen in [place] - appt made with a fib clinic

## 2015-11-09 NOTE — Discharge Instructions (Signed)
Continue your daily aspirin. Start your xarelto when you get home. Please call the Cardiology office in [facility] in the morning to let them know about your ED visit. They should see you later today. Return to the ED if you get chest pain, shortness of breath, sweaty or feel bad in any way.

## 2015-11-09 NOTE — Progress Notes (Signed)
SUBJECTIVE: The patient is a 55 year old male with a history of atrial fibrillation, hypertension, and obesity.  I last evaluated him in 09/2014. Also has a h/o anxiety.  Underwent TEE/DCV 11/26/14 by Dr. Harl Bowie.  Evaluated in ED for palpitations yesterday. Says, "I went into a fib at 10:05 PM".  ED physician consulted with Dr. Harrington Challenger. Was given flecainide and metoprolol.  CBC was normal. Creatinine elevated to 1.25.  CXR was normal.  ECG in ED showed atrial fibrillation, 98 bpm.  Says he doesn't feel much at present by way of palpitations.  ECG today in office shows a fib, 97 bpm.   Review of Systems: As per "subjective", otherwise negative.  Allergies  Allergen Reactions  . Ace Inhibitors Cough  . Lisinopril Cough  . Quinine Derivatives Rash    Current Outpatient Prescriptions  Medication Sig Dispense Refill  . allopurinol (ZYLOPRIM) 300 MG tablet Take 2 tablets by mouth daily.  3  . amLODipine (NORVASC) 5 MG tablet Take 5 mg by mouth daily.  3  . losartan (COZAAR) 100 MG tablet Take 100 mg by mouth daily.    . metFORMIN (GLUCOPHAGE-XR) 500 MG 24 hr tablet Take 1 tablet by mouth daily.  3  . metoprolol (LOPRESSOR) 50 MG tablet Take 100 mg by mouth daily.     . Vitamin D, Ergocalciferol, (DRISDOL) 50000 UNITS CAPS capsule Take 50,000 Units by mouth every 7 (seven) days.      No current facility-administered medications for this visit.     Past Medical History:  Diagnosis Date  . A-fib [facility])    DCCV 2009, 2012, 2014, Nov 2016  . Anxiety   . History of kidney stones   . HX: anticoagulation    very remotely and briefly on COumadin aound one of his CV, 2014 on Eliquis had hematuria with renal calculi and stopped  . Hyperlipidemia    patient denies this  . Hypertension   . IBS (irritable bowel syndrome)   . Normal cardiac stress test    Normal nuclear stress test , 2001    Past Surgical History:  Procedure Laterality Date  . CARDIOVERSION N/A 10/15/2012     Procedure: CARDIOVERSION;  Surgeon: Larey Dresser, MD;  Location: [facility] ENDOSCOPY;  Service: Cardiovascular;  Laterality: N/A;  . CARDIOVERSION N/A 11/26/2014   Procedure: CARDIOVERSION;  Surgeon: Arnoldo Lenis, MD;  Location: [facility] ORS;  Service: Endoscopy;  Laterality: N/A;  . CATARACT EXTRACTION W/PHACO Right 01/18/2014   Procedure: CATARACT EXTRACTION PHACO AND INTRAOCULAR LENS PLACEMENT (IOC);  Surgeon: Tonny Branch, MD;  Location: [facility]S;  Service: Ophthalmology;  Laterality: Right;  CDE 9.95  . EYE SURGERY Right    detached retina  . FOOT SURGERY Left   . KNEE ARTHROSCOPY Right   . LITHOTRIPSY     for kidney stones 2002  . TEE WITHOUT CARDIOVERSION N/A 11/26/2014   Procedure: TRANSESOPHAGEAL ECHOCARDIOGRAM (TEE) WITH PROPOFOL;  Surgeon: Arnoldo Lenis, MD;  Location: [facility];  Service: Endoscopy;  Laterality: N/A;  . VASECTOMY      Social History   Social History  . Marital status: Married    Spouse name: N/A  . Number of children: N/A  . Years of education: N/A   Occupational History  . Not on file.   Social History Main Topics  . Smoking status: Never Smoker  . Smokeless tobacco: Never Used  . Alcohol use No  . Drug use: No  . Sexual activity: Yes  Birth control/ protection: Surgical   Other Topics Concern  . Not on file   Social History Narrative  . No narrative on file     Vitals:   11/09/15 1340  BP: 112/78  Pulse: 97  SpO2: 99%  Weight: (!) 323 lb (146.5 kg)  Height: 6\' 8"  (2.032 m)    PHYSICAL EXAM General: NAD HEENT: Normal. Neck: No JVD, no thyromegaly. Lungs: Clear to auscultation bilaterally with normal respiratory effort. CV: Nondisplaced PMI.  Regular rate and irregular rhythm, normal S1/S2, no S3, no murmur. No pretibial or periankle edema.   Abdomen: Morbidly obese.  Neurologic: Alert and oriented.  Psych: Normal affect. Skin: Normal. Musculoskeletal: No gross deformities.    ECG: Most recent ECG  reviewed.      ASSESSMENT AND PLAN: 1. Longstanding persistent atrial fibrillation: CHADSVASC score is 2 (HTN, diabetes), thus anticoagulation is warranted. Will start Eliquis 5 mg BID as he apparently has a prescription at home.  I will make referral to a fib clinic. Left atrium was mildly dilated by TEE in 11/2014. He may be a viable candidate for ablation consideration although morbid obesity will have to be taken into account. Can consider cardioversion. Currently on metoprolol 50 mg twice daily. Will increase to 100 mg BID.  2. HTN: Controlled. Monitor given increase in metoprolol dose.   Time spent: 40 minutes, of which greater than 50% was spent reviewing symptoms, relevant blood tests and studies, and discussing management plan with the patient.   Kate Sable, M.D., F.A.C.C.

## 2015-11-11 ENCOUNTER — Encounter (HOSPITAL_COMMUNITY): Payer: Self-pay | Admitting: Nurse Practitioner

## 2015-11-11 ENCOUNTER — Ambulatory Visit (HOSPITAL_COMMUNITY)
Admission: RE | Admit: 2015-11-11 | Discharge: 2015-11-11 | Disposition: A | Discharge status: Home / Self Care | Payer: BLUE CROSS/BLUE SHIELD | Source: Ambulatory Visit | Attending: Nurse Practitioner | Admitting: Nurse Practitioner

## 2015-11-11 VITALS — BP 146/86 | HR 102 | Ht >= 80 in | Wt 328.8 lb

## 2015-11-11 DIAGNOSIS — Z888 Allergy status to other drugs, medicaments and biological substances status: Secondary | ICD-10-CM | POA: Insufficient documentation

## 2015-11-11 DIAGNOSIS — Z7984 Long term (current) use of oral hypoglycemic drugs: Secondary | ICD-10-CM | POA: Insufficient documentation

## 2015-11-11 DIAGNOSIS — I10 Essential (primary) hypertension: Secondary | ICD-10-CM | POA: Insufficient documentation

## 2015-11-11 DIAGNOSIS — I4891 Unspecified atrial fibrillation: Secondary | ICD-10-CM

## 2015-11-11 DIAGNOSIS — Z79899 Other long term (current) drug therapy: Secondary | ICD-10-CM | POA: Insufficient documentation

## 2015-11-11 DIAGNOSIS — E119 Type 2 diabetes mellitus without complications: Secondary | ICD-10-CM | POA: Insufficient documentation

## 2015-11-11 DIAGNOSIS — Z7901 Long term (current) use of anticoagulants: Secondary | ICD-10-CM | POA: Insufficient documentation

## 2015-11-11 DIAGNOSIS — K589 Irritable bowel syndrome without diarrhea: Secondary | ICD-10-CM | POA: Insufficient documentation

## 2015-11-11 DIAGNOSIS — I481 Persistent atrial fibrillation: Secondary | ICD-10-CM | POA: Insufficient documentation

## 2015-11-11 NOTE — Progress Notes (Signed)
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 He states it came on the day after he was shocked working on a vacuum cleaner. No other afib in family. He usually requires cardioversion one time a year. He was in [place] Wednesday night and was given flecainde pill in pocket 300 mg and it did not work. He states that this is the third time that he has taken this in the ER and it has failed to restore SR. Afib makes him feel fatigued but so much short of breath. Per a note from his previous cardiologist, Dr. Ron Parker in 2014, he put him on daily flecainide and referred to Dr. Rayann Heman for ablation. The pt states he does not remember taking flecainide daily and for some reason never saw Dr. Rayann Heman.  He has been off and on blood thinners, over the years, usually just long enough to satisfy requirements for cardioversion despite current chadsvasc score of 2(HTN,DM). He states that both of his parents have have GI bleeds and etiology was never determined  for either one of them, so he was afraid to continue blood thinners. He did start back on xarelto 20 mg Wednesday pm, when he developed afib.On metoprolol daily.  Review of lifestyle reveals no alcohol, or tobacco use. Denies snoring. Small caffeine use. Not very physically  active due to ankle/knee issues. He did lose 60 lbs in the past as part of a depression, gained 30 back. He is 6'8" and carries his weight well He reports weighing 320 lbs on graduation from high school, weight has been fairly consistent over the years.. Has a brother and father with similar builds. TEE in 2016 showed normal function and mild left atrial size.  Unfortunately, he lost his job working with the [employer] in [place] a few months ago and does not have insurance. We discussed various options to manage afib but he cannot afford  hospitalization for sotalol or tikosyn and is interested in ablation, but again cost is an issue. Not a good candidate  for amiodarone with low afib burden and relatively young age.  Today, he denies symptoms of palpitations, chest pain, shortness of breath, orthopnea, PND, lower extremity edema, dizziness, presyncope, syncope, or neurologic sequela. Positive for fatigue. The patient is tolerating medications without difficulties and is otherwise without complaint today.   Past Medical History:  Diagnosis Date  . A-fib [facility])    DCCV 2009, 2012, 2014, Nov 2016  . Anxiety   . History of kidney stones   . HX: anticoagulation    very remotely and briefly on COumadin aound one of his CV, 2014 on Eliquis had hematuria with renal calculi and stopped  . Hyperlipidemia    patient denies this  . Hypertension   . IBS (irritable bowel syndrome)   . Normal cardiac stress test    Normal nuclear stress test , 2001   Past Surgical History:  Procedure Laterality Date  . CARDIOVERSION N/A 10/15/2012   Procedure: CARDIOVERSION;  Surgeon: Larey Dresser, MD;  Location: [facility] ENDOSCOPY;  Service: Cardiovascular;  Laterality: N/A;  . CARDIOVERSION N/A 11/26/2014   Procedure: CARDIOVERSION;  Surgeon: Arnoldo Lenis, MD;  Location: [facility] ORS;  Service: Endoscopy;  Laterality: N/A;  . CATARACT EXTRACTION W/PHACO Right 01/18/2014   Procedure: CATARACT EXTRACTION PHACO AND INTRAOCULAR LENS PLACEMENT (IOC);  Surgeon: Tonny Branch, MD;  Location: [facility] ORS;  Service: Ophthalmology;  Laterality: Right;  CDE 9.95  . EYE SURGERY Right    detached retina  . FOOT SURGERY Left   . KNEE ARTHROSCOPY Right   . LITHOTRIPSY     for kidney stones 2002  . TEE WITHOUT CARDIOVERSION N/A 11/26/2014   Procedure: TRANSESOPHAGEAL ECHOCARDIOGRAM (TEE) WITH PROPOFOL;  Surgeon: Arnoldo Lenis, MD;  Location: [facility] ORS;  Service: Endoscopy;  Laterality: N/A;  . VASECTOMY      Current Outpatient Prescriptions  Medication Sig Dispense  Refill  . allopurinol (ZYLOPRIM) 300 MG tablet Take 1 tablet by mouth daily.   3  . calcium gluconate 500 MG tablet Take 1 tablet by mouth daily.    Marland Kitchen losartan (COZAAR) 100 MG tablet Take 100 mg by mouth daily.    . metFORMIN (GLUCOPHAGE-XR) 500 MG 24 hr tablet Take 1 tablet by mouth daily.  3  . metoprolol (LOPRESSOR) 100 MG tablet Take 1 tablet (100 mg total) by mouth 2 (two) times daily. 180 tablet 3  . rivaroxaban (XARELTO) 20 MG TABS tablet Take 1 tablet (20 mg total) by mouth daily with supper. 30 tablet 6  . Vitamin D, Ergocalciferol, (DRISDOL) 50000 UNITS CAPS capsule Take 50,000 Units by mouth every 7 (seven) days.     Marland Kitchen amLODipine (NORVASC) 5 MG tablet Take 5 mg by mouth daily.  3   No current facility-administered medications for this encounter.     Allergies  Allergen Reactions  . Ace Inhibitors Cough  . Lisinopril Cough  . Quinine Derivatives Rash    Social History   Social History  . Marital status: Married    Spouse name: N/A  . Number of children: N/A  . Years of education: N/A   Occupational History  . Not on file.   Social History Main Topics  . Smoking status: Never Smoker  . Smokeless tobacco: Never Used  . Alcohol use No  . Drug use: No  . Sexual activity: Yes    Birth control/ protection: Surgical   Other Topics Concern  . Not on file   Social History Narrative  . No narrative on file    Family History  Problem Relation Age of Onset  . Hypertension      ROS- All systems are reviewed and negative except as per the HPI above  Physical Exam: Vitals:   11/11/15 1116  BP: (!) 146/86  Pulse: (!) 102  Weight: (!) 328 lb 12.8 oz (149.1 kg)  Height: 6\' 8"  (2.032 m)    GEN- The patient is well appearing, alert and oriented x 3 today.   Head- normocephalic, atraumatic Eyes-  Sclera clear, conjunctiva pink Ears- hearing intact Oropharynx- clear Neck- supple, no JVP Lymph- no cervical lymphadenopathy Lungs- Clear to ausculation  bilaterally, normal work of breathing Heart- irregular rate and rhythm, no murmurs, rubs or gallops, PMI not laterally displaced GI- soft, NT, ND, + BS Extremities- no clubbing, cyanosis, or edema MS- no significant deformity or atrophy Skin- no rash or lesion Psych- euthymic mood, full affect Neuro- strength and sensation are intact  EKG-afib at 102 bpm, qrs int 92 ms, qtc 430 ms Epic records reviewed  Assessment and Plan: 1. Persistent afib Pt has required cardioversion in past to restore SR and has failed pill in pocket flecainide x 3 Will schedule TEE guided cardioversion next week after taking xarelto 20 mg for several more days Continue metoprolol Encouraged to stay on xarelto long term  with CHA2DS2VASc score of 2  Is currently out of work without insurance which limits options to restore SR long term with ablation or antiarrythmic's that would require hospitalization I will have Kennyth Lose, SW to call and discuss services that may be available to him. He will need assistance with xarelto. Bmet,cbc  F/u in afib clinic one week after cardioversion  We tried to schedule cardioversion in [place] for his convenience but hit several road blocks. He will have done here Tuesday am and will have labs drawn stat prior to procedure.  Geroge Baseman Selinda Korzeniewski, [facility] [address] [place], [place] [address] [phone]

## 2015-11-11 NOTE — Patient Instructions (Signed)
Your cardioversion is scheduled for : Tuesday Nov 15, 2015 Arrive at the [facility] and go to admitting at 9:30 a.m. Do Not eat or drink anything after midnight the night prior to your procedure. Take all your medications with a sip of water prior to arrival. Do NOT miss any doses of your blood thinner. You will NOT be able to drive home after your procedure.

## 2015-11-14 ENCOUNTER — Other Ambulatory Visit (HOSPITAL_COMMUNITY): Payer: Self-pay | Admitting: *Deleted

## 2015-11-14 DIAGNOSIS — I4819 Other persistent atrial fibrillation: Secondary | ICD-10-CM

## 2015-11-15 ENCOUNTER — Other Ambulatory Visit (HOSPITAL_COMMUNITY): Payer: Self-pay

## 2015-11-15 ENCOUNTER — Ambulatory Visit (HOSPITAL_COMMUNITY): Payer: Self-pay | Admitting: Anesthesiology

## 2015-11-15 ENCOUNTER — Ambulatory Visit (HOSPITAL_BASED_OUTPATIENT_CLINIC_OR_DEPARTMENT_OTHER): Payer: Self-pay

## 2015-11-15 ENCOUNTER — Ambulatory Visit (HOSPITAL_BASED_OUTPATIENT_CLINIC_OR_DEPARTMENT_OTHER)
Admission: RE | Admit: 2015-11-15 | Discharge: 2015-11-15 | Disposition: A | Discharge status: Home / Self Care | Payer: Self-pay | Source: Ambulatory Visit | Attending: Nurse Practitioner | Admitting: Nurse Practitioner

## 2015-11-15 ENCOUNTER — Encounter (HOSPITAL_COMMUNITY)
Admission: RE | Disposition: A | Discharge status: Home / Self Care | Payer: Self-pay | Source: Ambulatory Visit | Attending: Cardiology

## 2015-11-15 ENCOUNTER — Encounter: Payer: Self-pay | Admitting: Licensed Clinical Social Worker

## 2015-11-15 ENCOUNTER — Encounter (HOSPITAL_COMMUNITY): Payer: Self-pay | Admitting: *Deleted

## 2015-11-15 ENCOUNTER — Ambulatory Visit (HOSPITAL_COMMUNITY)
Admission: RE | Admit: 2015-11-15 | Discharge: 2015-11-15 | Disposition: A | Discharge status: Home / Self Care | Payer: Self-pay | Source: Ambulatory Visit | Attending: Cardiology | Admitting: Cardiology

## 2015-11-15 DIAGNOSIS — Z79899 Other long term (current) drug therapy: Secondary | ICD-10-CM | POA: Insufficient documentation

## 2015-11-15 DIAGNOSIS — I10 Essential (primary) hypertension: Secondary | ICD-10-CM | POA: Insufficient documentation

## 2015-11-15 DIAGNOSIS — Z7984 Long term (current) use of oral hypoglycemic drugs: Secondary | ICD-10-CM | POA: Insufficient documentation

## 2015-11-15 DIAGNOSIS — I340 Nonrheumatic mitral (valve) insufficiency: Secondary | ICD-10-CM

## 2015-11-15 DIAGNOSIS — I4891 Unspecified atrial fibrillation: Secondary | ICD-10-CM

## 2015-11-15 DIAGNOSIS — I480 Paroxysmal atrial fibrillation: Secondary | ICD-10-CM

## 2015-11-15 DIAGNOSIS — E119 Type 2 diabetes mellitus without complications: Secondary | ICD-10-CM | POA: Insufficient documentation

## 2015-11-15 DIAGNOSIS — E785 Hyperlipidemia, unspecified: Secondary | ICD-10-CM | POA: Insufficient documentation

## 2015-11-15 DIAGNOSIS — I4819 Other persistent atrial fibrillation: Secondary | ICD-10-CM

## 2015-11-15 DIAGNOSIS — I481 Persistent atrial fibrillation: Secondary | ICD-10-CM | POA: Insufficient documentation

## 2015-11-15 DIAGNOSIS — F419 Anxiety disorder, unspecified: Secondary | ICD-10-CM | POA: Insufficient documentation

## 2015-11-15 DIAGNOSIS — Z7901 Long term (current) use of anticoagulants: Secondary | ICD-10-CM | POA: Insufficient documentation

## 2015-11-15 HISTORY — PX: CARDIOVERSION: SHX1299

## 2015-11-15 HISTORY — PX: TEE WITHOUT CARDIOVERSION: SHX5443

## 2015-11-15 LAB — CBC
HEMATOCRIT: 42.8 % (ref 39.0–52.0)
Hemoglobin: 14.7 g/dL (ref 13.0–17.0)
MCH: 30.1 pg (ref 26.0–34.0)
MCHC: 34.3 g/dL (ref 30.0–36.0)
MCV: 87.5 fL (ref 78.0–100.0)
Platelets: 233 10*3/uL (ref 150–400)
RBC: 4.89 MIL/uL (ref 4.22–5.81)
RDW: 13.5 % (ref 11.5–15.5)
WBC: 7.5 10*3/uL (ref 4.0–10.5)

## 2015-11-15 LAB — BASIC METABOLIC PANEL
Anion gap: 9 (ref 5–15)
BUN: 17 mg/dL (ref 6–20)
CALCIUM: 9.5 mg/dL (ref 8.9–10.3)
CO2: 29 mmol/L (ref 22–32)
Chloride: 100 mmol/L — ABNORMAL LOW (ref 101–111)
Creatinine, Ser: 1.05 mg/dL (ref 0.61–1.24)
GFR calc non Af Amer: >60 mL/min (ref >60)
GLUCOSE: 134 mg/dL — AB (ref 65–99)
Potassium: 4.6 mmol/L (ref 3.5–5.1)
Sodium: 138 mmol/L (ref 135–145)

## 2015-11-15 LAB — TSH: TSH: 1.989 u[IU]/mL (ref 0.350–4.500)

## 2015-11-15 LAB — GLUCOSE, CAPILLARY: Glucose-Capillary: 110 mg/dL — ABNORMAL HIGH (ref 65–99)

## 2015-11-15 SURGERY — CARDIOVERSION
Anesthesia: Monitor Anesthesia Care

## 2015-11-15 MED ORDER — PROPOFOL 500 MG/50ML IV EMUL
INTRAVENOUS | Status: DC | PRN
  Administered 2015-11-15: 75 ug/kg/min via INTRAVENOUS

## 2015-11-15 MED ORDER — RIVAROXABAN 20 MG PO TABS: 20.0000 mg | ORAL_TABLET | Freq: Every day | ORAL | 11 refills | Status: DC

## 2015-11-15 MED ORDER — LACTATED RINGERS IV SOLN
INTRAVENOUS | Status: DC
  Administered 2015-11-15 (×2): via INTRAVENOUS

## 2015-11-15 MED ORDER — SODIUM CHLORIDE 0.9 % IV SOLN: INTRAVENOUS | Status: DC

## 2015-11-15 MED ORDER — PROPOFOL 10 MG/ML IV BOLUS
INTRAVENOUS | Status: DC | PRN
  Administered 2015-11-15 (×2): 20 mg via INTRAVENOUS

## 2015-11-15 MED ORDER — BUTAMBEN-TETRACAINE-BENZOCAINE 2-2-14 % EX AERO
INHALATION_SPRAY | CUTANEOUS | Status: DC | PRN
  Administered 2015-11-15: 2 via TOPICAL

## 2015-11-15 NOTE — Interval H&P Note (Signed)
History and Physical Interval Note:  11/15/2015 10:55 AM  Bradley Rodriguez  has presented today for surgery, with the diagnosis of afib  The various methods of treatment have been discussed with the patient and family. After consideration of risks, benefits and other options for treatment, the patient has consented to  Procedure(s): CARDIOVERSION (N/A) TRANSESOPHAGEAL ECHOCARDIOGRAM (TEE) (N/A) as a surgical intervention .  The patient's history has been reviewed, patient examined, no change in status, stable for surgery.  I have reviewed the patient's chart and labs.  Questions were answered to the patient's satisfaction.     [employer]

## 2015-11-15 NOTE — Anesthesia Postprocedure Evaluation (Signed)
Anesthesia Post Note  Patient: Bradley Rodriguez  Procedure(s) Performed: Procedure(s) (LRB): CARDIOVERSION (N/A) TRANSESOPHAGEAL ECHOCARDIOGRAM (TEE) (N/A)  Patient location during evaluation: PACU Anesthesia Type: MAC Level of consciousness: awake and alert Pain management: pain level controlled Vital Signs Assessment: post-procedure vital signs reviewed and stable Respiratory status: spontaneous breathing, nonlabored ventilation and respiratory function stable Cardiovascular status: stable and blood pressure returned to baseline Anesthetic complications: no    Last Vitals:  Vitals:   11/15/15 1150 11/15/15 1200  BP: 116/75 124/75  Pulse: 61 62  Resp: 16 17  Temp:      Last Pain:  Vitals:   11/15/15 1144  TempSrc: Oral                 Mckinzie Saksa,W. EDMOND

## 2015-11-15 NOTE — Discharge Instructions (Signed)
Electrical Cardioversion, Care After °Refer to this sheet in the next few weeks. These instructions provide you with information on caring for yourself after your procedure. Your health care provider may also give you more specific instructions. Your treatment has been planned according to current medical practices, but problems sometimes occur. Call your health care provider if you have any problems or questions after your procedure. °WHAT TO EXPECT AFTER THE PROCEDURE °After your procedure, it is typical to have the following sensations: °· Some redness on the skin where the shocks were delivered. If this is tender, a sunburn lotion or hydrocortisone cream may help. °· Possible return of an abnormal heart rhythm within hours or days after the procedure. °HOME CARE INSTRUCTIONS °· Take medicines only as directed by your health care provider. Be sure you understand how and when to take your medicine. °· Learn how to feel your pulse and check it often. °· Limit your activity for 48 hours after the procedure or as directed by your health care provider. °· Avoid or minimize caffeine and other stimulants as directed by your health care provider. °SEEK MEDICAL CARE IF: °· You feel like your heart is beating too fast or your pulse is not regular. °· You have any questions about your medicines. °· You have bleeding that will not stop. °SEEK IMMEDIATE MEDICAL CARE IF: °· You are dizzy or feel faint. °· It is hard to breathe or you feel short of breath. °· There is a change in discomfort in your chest. °· Your speech is slurred or you have trouble moving an arm or leg on one side of your body. °· You get a serious muscle cramp that does not go away. °· Your fingers or toes turn cold or blue. °  °This information is not intended to replace advice given to you by your health care provider. Make sure you discuss any questions you have with your health care provider. °  °Document Released: 10/29/2012 Document Revised: 01/29/2014  Document Reviewed: 10/29/2012 °Elsevier Interactive Patient Education ©2016 [employer] ° °

## 2015-11-15 NOTE — Progress Notes (Signed)
Echocardiogram Echocardiogram Transesophageal has been performed.  Bradley Rodriguez 11/15/2015, 11:46 AM

## 2015-11-15 NOTE — Progress Notes (Signed)
CSW referred to assist with resources for insurance. Patient reports he was previously working and received a Psychologist, forensic which included insurance until the Spring of 2017. Patient has a pending SSD application although denied it is now in the appeal process. Patient's wife is working and has insurance through her employer but mentioned that it would cost $300 weekly to add patient to the plan. "We just can't afford it". CSW discussed [place] Discount plan, possible deductible medicaid program and also encouraged patient to continue with appeal for SSD. Patient and wife verbalize understanding and will reach out to CSW if further needs arise. Raquel Sarna, LCSW [phone]

## 2015-11-15 NOTE — Anesthesia Preprocedure Evaluation (Signed)
Anesthesia Evaluation  Patient identified by MRN, date of birth, ID band Patient awake    Reviewed: Allergy & Precautions, H&P , NPO status , Patient's Chart, lab work & pertinent test results, reviewed documented beta blocker date and time   Airway Mallampati: III  TM Distance: >3 FB Neck ROM: Full    Dental no notable dental hx. (+) Teeth Intact, Dental Advisory Given   Pulmonary neg pulmonary ROS,    Pulmonary exam normal breath sounds clear to auscultation       Cardiovascular hypertension, Pt. on medications and Pt. on home beta blockers + dysrhythmias Atrial Fibrillation  Rhythm:Irregular Rate:Normal     Neuro/Psych Anxiety negative neurological ROS  negative psych ROS   GI/Hepatic negative GI ROS, Neg liver ROS,   Endo/Other  diabetes, Type 2, Oral Hypoglycemic Agents  Renal/GU negative Renal ROS  negative genitourinary   Musculoskeletal   Abdominal   Peds  Hematology negative hematology ROS (+)   Anesthesia Other Findings   Reproductive/Obstetrics negative OB ROS                             Anesthesia Physical Anesthesia Plan  ASA: III  Anesthesia Plan: MAC   Post-op Pain Management:    Induction: Intravenous  Airway Management Planned: Nasal Cannula  Additional Equipment:   Intra-op Plan:   Post-operative Plan:   Informed Consent: I have reviewed the patients History and Physical, chart, labs and discussed the procedure including the risks, benefits and alternatives for the proposed anesthesia with the patient or authorized representative who has indicated his/her understanding and acceptance.   Dental advisory given  Plan Discussed with: CRNA  Anesthesia Plan Comments:         Anesthesia Quick Evaluation

## 2015-11-15 NOTE — H&P (View-Only) (Signed)
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 He states it came on the day after he was shocked working on a vacuum cleaner. No other afib in family. He usually requires cardioversion one time a year. He was in [place] Wednesday night and was given flecainde pill in pocket 300 mg and it did not work. He states that this is the third time that he has taken this in the ER and it has failed to restore SR. Afib makes him feel fatigued but so much short of breath. Per a note from his previous cardiologist, Dr. Ron Parker in 2014, he put him on daily flecainide and referred to Dr. Rayann Heman for ablation. The pt states he does not remember taking flecainide daily and for some reason never saw Dr. Rayann Heman.  He has been off and on blood thinners, over the years, usually just long enough to satisfy requirements for cardioversion despite current chadsvasc score of 2(HTN,DM). He states that both of his parents have have GI bleeds and etiology was never determined  for either one of them, so he was afraid to continue blood thinners. He did start back on xarelto 20 mg Wednesday pm, when he developed afib.On metoprolol daily.  Review of lifestyle reveals no alcohol, or tobacco use. Denies snoring. Small caffeine use. Not very physically  active due to ankle/knee issues. He did lose 60 lbs in the past as part of a depression, gained 30 back. He is 6'8" and carries his weight well He reports weighing 320 lbs on graduation from high school, weight has been fairly consistent over the years.. Has a brother and father with similar builds. TEE in 2016 showed normal function and mild left atrial size.  Unfortunately, he lost his job working with the [employer] in [place] a few months ago and does not have insurance. We discussed various options to manage afib but he cannot afford  hospitalization for sotalol or tikosyn and is interested in ablation, but again cost is an issue. Not a good candidate  for amiodarone with low afib burden and relatively young age.  Today, he denies symptoms of palpitations, chest pain, shortness of breath, orthopnea, PND, lower extremity edema, dizziness, presyncope, syncope, or neurologic sequela. Positive for fatigue. The patient is tolerating medications without difficulties and is otherwise without complaint today.   Past Medical History:  Diagnosis Date  . A-fib [facility])    DCCV 2009, 2012, 2014, Nov 2016  . Anxiety   . History of kidney stones   . HX: anticoagulation    very remotely and briefly on COumadin aound one of his CV, 2014 on Eliquis had hematuria with renal calculi and stopped  . Hyperlipidemia    patient denies this  . Hypertension   . IBS (irritable bowel syndrome)   . Normal cardiac stress test    Normal nuclear stress test , 2001   Past Surgical History:  Procedure Laterality Date  . CARDIOVERSION N/A 10/15/2012   Procedure: CARDIOVERSION;  Surgeon: Larey Dresser, MD;  Location: [facility] ENDOSCOPY;  Service: Cardiovascular;  Laterality: N/A;  . CARDIOVERSION N/A 11/26/2014   Procedure: CARDIOVERSION;  Surgeon: Arnoldo Lenis, MD;  Location: [facility] ORS;  Service: Endoscopy;  Laterality: N/A;  . CATARACT EXTRACTION W/PHACO Right 01/18/2014   Procedure: CATARACT EXTRACTION PHACO AND INTRAOCULAR LENS PLACEMENT (IOC);  Surgeon: Tonny Branch, MD;  Location: [facility] ORS;  Service: Ophthalmology;  Laterality: Right;  CDE 9.95  . EYE SURGERY Right    detached retina  . FOOT SURGERY Left   . KNEE ARTHROSCOPY Right   . LITHOTRIPSY     for kidney stones 2002  . TEE WITHOUT CARDIOVERSION N/A 11/26/2014   Procedure: TRANSESOPHAGEAL ECHOCARDIOGRAM (TEE) WITH PROPOFOL;  Surgeon: Arnoldo Lenis, MD;  Location: [facility] ORS;  Service: Endoscopy;  Laterality: N/A;  . VASECTOMY      Current Outpatient Prescriptions  Medication Sig Dispense  Refill  . allopurinol (ZYLOPRIM) 300 MG tablet Take 1 tablet by mouth daily.   3  . calcium gluconate 500 MG tablet Take 1 tablet by mouth daily.    Marland Kitchen losartan (COZAAR) 100 MG tablet Take 100 mg by mouth daily.    . metFORMIN (GLUCOPHAGE-XR) 500 MG 24 hr tablet Take 1 tablet by mouth daily.  3  . metoprolol (LOPRESSOR) 100 MG tablet Take 1 tablet (100 mg total) by mouth 2 (two) times daily. 180 tablet 3  . rivaroxaban (XARELTO) 20 MG TABS tablet Take 1 tablet (20 mg total) by mouth daily with supper. 30 tablet 6  . Vitamin D, Ergocalciferol, (DRISDOL) 50000 UNITS CAPS capsule Take 50,000 Units by mouth every 7 (seven) days.     Marland Kitchen amLODipine (NORVASC) 5 MG tablet Take 5 mg by mouth daily.  3   No current facility-administered medications for this encounter.     Allergies  Allergen Reactions  . Ace Inhibitors Cough  . Lisinopril Cough  . Quinine Derivatives Rash    Social History   Social History  . Marital status: Married    Spouse name: N/A  . Number of children: N/A  . Years of education: N/A   Occupational History  . Not on file.   Social History Main Topics  . Smoking status: Never Smoker  . Smokeless tobacco: Never Used  . Alcohol use No  . Drug use: No  . Sexual activity: Yes    Birth control/ protection: Surgical   Other Topics Concern  . Not on file   Social History Narrative  . No narrative on file    Family History  Problem Relation Age of Onset  . Hypertension      ROS- All systems are reviewed and negative except as per the HPI above  Physical Exam: Vitals:   11/11/15 1116  BP: (!) 146/86  Pulse: (!) 102  Weight: (!) 328 lb 12.8 oz (149.1 kg)  Height: 6\' 8"  (2.032 m)    GEN- The patient is well appearing, alert and oriented x 3 today.   Head- normocephalic, atraumatic Eyes-  Sclera clear, conjunctiva pink Ears- hearing intact Oropharynx- clear Neck- supple, no JVP Lymph- no cervical lymphadenopathy Lungs- Clear to ausculation  bilaterally, normal work of breathing Heart- irregular rate and rhythm, no murmurs, rubs or gallops, PMI not laterally displaced GI- soft, NT, ND, + BS Extremities- no clubbing, cyanosis, or edema MS- no significant deformity or atrophy Skin- no rash or lesion Psych- euthymic mood, full affect Neuro- strength and sensation are intact  EKG-afib at 102 bpm, qrs int 92 ms, qtc 430 ms Epic records reviewed  Assessment and Plan: 1. Persistent afib Pt has required cardioversion in past to restore SR and has failed pill in pocket flecainide x 3 Will schedule TEE guided cardioversion next week after taking xarelto 20 mg for several more days Continue metoprolol Encouraged to stay on xarelto long term  with CHA2DS2VASc score of 2  Is currently out of work without insurance which limits options to restore SR long term with ablation or antiarrythmic's that would require hospitalization I will have Kennyth Lose, SW to call and discuss services that may be available to him. He will need assistance with xarelto. Bmet,cbc  F/u in afib clinic one week after cardioversion  We tried to schedule cardioversion in [place] for his convenience but hit several road blocks. He will have done here Tuesday am and will have labs drawn stat prior to procedure.  Geroge Baseman Jennefer Kopp, [facility] [address] [place],  [address] [phone]

## 2015-11-15 NOTE — CV Procedure (Signed)
    Electrical Cardioversion Procedure Note Bradley Rodriguez [id number] 1960/03/23  Procedure: Electrical Cardioversion Indications:  Atrial Fibrillation  Time Out: Verified patient identification, verified procedure,medications/allergies/relevent history reviewed, required imaging and test results available.  Performed  Procedure Details  The patient was NPO after midnight. Anesthesia was administered at the beside  by anesthesia with propofol.  Cardioversion was performed with synchronized biphasic defibrillation via AP pads with 120, 150 joules.  2 attempt(s) were performed.  The patient converted to normal sinus rhythm. The patient tolerated the procedure well   IMPRESSION:  Successful cardioversion of atrial fibrillation. Occasional junctional escape note immediately post conversion.     Candee Furbish 11/15/2015, 11:57 AM

## 2015-11-15 NOTE — Transfer of Care (Signed)
Immediate Anesthesia Transfer of Care Note  Patient: Bradley Rodriguez  Procedure(s) Performed: Procedure(s): CARDIOVERSION (N/A) TRANSESOPHAGEAL ECHOCARDIOGRAM (TEE) (N/A)  Patient Location: Endoscopy Unit  Anesthesia Type:MAC  Level of Consciousness: awake, alert  and oriented  Airway & Oxygen Therapy: Patient Spontanous Breathing and Patient connected to nasal cannula oxygen  Post-op Assessment: Report given to RN and Post -op Vital signs reviewed and stable  Post vital signs: Reviewed and stable  Last Vitals:  Vitals:   11/15/15 0933 11/15/15 1144  BP: (!) 176/110 102/70  Pulse: 94 63  Resp: 17 16  Temp: 36.6 C     Last Pain:  Vitals:   11/15/15 1144  TempSrc: Oral         Complications: No apparent anesthesia complications

## 2015-11-16 ENCOUNTER — Encounter (HOSPITAL_COMMUNITY): Payer: Self-pay | Admitting: Cardiology

## 2015-11-17 ENCOUNTER — Ambulatory Visit (HOSPITAL_COMMUNITY): Payer: Self-pay | Admitting: Nurse Practitioner

## 2015-11-17 ENCOUNTER — Encounter (HOSPITAL_COMMUNITY): Payer: Self-pay

## 2015-11-17 ENCOUNTER — Ambulatory Visit (HOSPITAL_COMMUNITY): Admit: 2015-11-17 | Payer: Self-pay | Admitting: Cardiology

## 2015-11-17 SURGERY — ECHOCARDIOGRAM, TRANSESOPHAGEAL
Anesthesia: Monitor Anesthesia Care

## 2015-11-23 ENCOUNTER — Encounter (HOSPITAL_COMMUNITY): Payer: Self-pay | Admitting: Nurse Practitioner

## 2015-11-23 ENCOUNTER — Ambulatory Visit (HOSPITAL_COMMUNITY)
Admission: RE | Admit: 2015-11-23 | Discharge: 2015-11-23 | Disposition: A | Discharge status: Home / Self Care | Payer: Self-pay | Source: Ambulatory Visit | Attending: Nurse Practitioner | Admitting: Nurse Practitioner

## 2015-11-23 VITALS — BP 138/82 | HR 56 | Ht >= 80 in | Wt 328.2 lb

## 2015-11-23 DIAGNOSIS — I480 Paroxysmal atrial fibrillation: Secondary | ICD-10-CM

## 2015-11-23 DIAGNOSIS — Z7901 Long term (current) use of anticoagulants: Secondary | ICD-10-CM | POA: Insufficient documentation

## 2015-11-23 DIAGNOSIS — Z79899 Other long term (current) drug therapy: Secondary | ICD-10-CM | POA: Insufficient documentation

## 2015-11-23 DIAGNOSIS — F419 Anxiety disorder, unspecified: Secondary | ICD-10-CM | POA: Insufficient documentation

## 2015-11-23 DIAGNOSIS — I10 Essential (primary) hypertension: Secondary | ICD-10-CM | POA: Insufficient documentation

## 2015-11-23 DIAGNOSIS — Z7984 Long term (current) use of oral hypoglycemic drugs: Secondary | ICD-10-CM | POA: Insufficient documentation

## 2015-11-23 DIAGNOSIS — Z87442 Personal history of urinary calculi: Secondary | ICD-10-CM | POA: Insufficient documentation

## 2015-11-23 DIAGNOSIS — Z8249 Family history of ischemic heart disease and other diseases of the circulatory system: Secondary | ICD-10-CM | POA: Insufficient documentation

## 2015-11-23 DIAGNOSIS — I481 Persistent atrial fibrillation: Secondary | ICD-10-CM | POA: Insufficient documentation

## 2015-11-23 DIAGNOSIS — E119 Type 2 diabetes mellitus without complications: Secondary | ICD-10-CM | POA: Insufficient documentation

## 2015-11-23 NOTE — Progress Notes (Signed)
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 He states it came on the day after he was shocked working on a vacuum cleaner. No other afib in family. He usually requires cardioversion one time a year. He was in [place] Wednesday night and was given flecainde pill in pocket 300 mg and it did not work. He states that this is the third time that he has taken this in the ER and it has failed to restore SR. Afib makes him feel fatigued but so much short of breath. Per a note from his previous cardiologist, Dr. Ron Parker in 2014, he put him on daily flecainide and referred to Dr. Rayann Heman for ablation. The pt states he does not remember taking flecainide daily and for some reason never saw Dr. Rayann Heman.  He has been off and on blood thinners, over the years, usually just long enough to satisfy requirements for cardioversion despite current chadsvasc score of 2(HTN,DM). He states that both of his parents have have GI bleeds and etiology was never determined  for either one of them, so he was afraid to continue blood thinners. He did start back on xarelto 20 mg Wednesday pm, when he developed afib.On metoprolol daily.  Review of lifestyle reveals no alcohol, or tobacco use. Denies snoring. Small caffeine use. Not very physically  active due to ankle/knee issues. He did lose 60 lbs in the past as part of a depression, gained 30 back. He is 6'8" and carries his weight well He reports weighing 320 lbs on graduation from high school, weight has been fairly consistent over the years.. Has a brother and father with similar builds. TEE in 2016 showed normal function and mild left atrial size.  Unfortunately, he lost his job working with the [employer] in [place] a few months ago and does not have insurance. We discussed various options to manage afib but he cannot afford  hospitalization for sotalol or tikosyn and is interested in ablation, but again cost is an issue. Not a good candidate  for amiodarone with low afib burden and relatively young age.  F/u in afib clinic s/p successful cardioversion and is maintaining SR. He continues on xarelto and reminded him I would like him to stay on long term with chadsvasc score of 2. He is trying to obtain insurance with being laid off work earlier this year. If so, he may have more options going forward if he does return to afib.  Today, he denies symptoms of palpitations, chest pain, shortness of breath, orthopnea, PND, lower extremity edema, dizziness, presyncope, syncope, or neurologic sequela. Positive for fatigue. The patient is tolerating medications without difficulties and is otherwise without complaint today.   Past Medical History:  Diagnosis Date  . A-fib [facility])    DCCV 2009, 2012, 2014, Nov 2016  . Anxiety   . History of kidney stones   . HX: anticoagulation    very remotely and briefly on COumadin aound one of his CV, 2014 on Eliquis had hematuria with renal calculi and stopped  . Hyperlipidemia    patient denies this  . Hypertension   . IBS (irritable bowel syndrome)   . Normal cardiac stress test    Normal nuclear stress test , 2001   Past Surgical History:  Procedure Laterality Date  . CARDIOVERSION N/A 10/15/2012  Procedure: CARDIOVERSION;  Surgeon: Larey Dresser, MD;  Location: [facility] ENDOSCOPY;  Service: Cardiovascular;  Laterality: N/A;  . CARDIOVERSION N/A 11/26/2014   Procedure: CARDIOVERSION;  Surgeon: Arnoldo Lenis, MD;  Location: [facility] ORS;  Service: Endoscopy;  Laterality: N/A;  . CARDIOVERSION N/A 11/15/2015   Procedure: CARDIOVERSION;  Surgeon: Jerline Pain, MD;  Location: [place];  Service: Cardiovascular;  Laterality: N/A;  . CATARACT EXTRACTION W/PHACO Right 01/18/2014   Procedure: CATARACT EXTRACTION PHACO AND INTRAOCULAR LENS PLACEMENT ([place]);  Surgeon: Tonny Branch, MD;   Location: [facility] ORS;  Service: Ophthalmology;  Laterality: Right;  CDE 9.95  . EYE SURGERY Right    detached retina  . FOOT SURGERY Left   . KNEE ARTHROSCOPY Right   . LITHOTRIPSY     for kidney stones 2002  . TEE WITHOUT CARDIOVERSION N/A 11/26/2014   Procedure: TRANSESOPHAGEAL ECHOCARDIOGRAM (TEE) WITH PROPOFOL;  Surgeon: Arnoldo Lenis, MD;  Location: [facility];  Service: Endoscopy;  Laterality: N/A;  . TEE WITHOUT CARDIOVERSION N/A 11/15/2015   Procedure: TRANSESOPHAGEAL ECHOCARDIOGRAM (TEE);  Surgeon: Jerline Pain, MD;  Location: [facility] ENDOSCOPY;  Service: Cardiovascular;  Laterality: N/A;  . VASECTOMY      Current Outpatient Prescriptions  Medication Sig Dispense Refill  . allopurinol (ZYLOPRIM) 300 MG tablet Take 1 tablet by mouth daily.   3  . amLODipine (NORVASC) 5 MG tablet Take 5 mg by mouth daily.  3  . calcium gluconate 500 MG tablet Take 1 tablet by mouth daily.    Marland Kitchen losartan (COZAAR) 100 MG tablet Take 100 mg by mouth daily.    . metFORMIN (GLUCOPHAGE-XR) 500 MG 24 hr tablet Take 1 tablet by mouth daily.  3  . metoprolol (LOPRESSOR) 100 MG tablet Take 1 tablet (100 mg total) by mouth 2 (two) times daily. 180 tablet 3  . rivaroxaban (XARELTO) 20 MG TABS tablet Take 1 tablet (20 mg total) by mouth daily with supper. 30 tablet 11  . Vitamin D, Ergocalciferol, (DRISDOL) 50000 UNITS CAPS capsule Take 50,000 Units by mouth every 7 (seven) days.      No current facility-administered medications for this encounter.     Allergies  Allergen Reactions  . Ace Inhibitors Cough  . Lisinopril Cough  . Quinine Derivatives Rash    Social History   Social History  . Marital status: Married    Spouse name: N/A  . Number of children: N/A  . Years of education: N/A   Occupational History  . Not on file.   Social History Main Topics  . Smoking status: Never Smoker  . Smokeless tobacco: Never Used  . Alcohol use No  . Drug use: No  . Sexual activity: Yes    Birth control/  protection: Surgical   Other Topics Concern  . Not on file   Social History Narrative  . No narrative on file    Family History  Problem Relation Age of Onset  . Hypertension      ROS- All systems are reviewed and negative except as per the HPI above  Physical Exam: Vitals:   11/23/15 1008  BP: 138/82  Pulse: (!) 56  Weight: (!) 328 lb 3.2 oz (148.9 kg)  Height: 6\' 8"  (2.032 m)    GEN- The patient is well appearing, alert and oriented x 3 today.   Head- normocephalic, atraumatic Eyes-  Sclera clear, conjunctiva pink Ears- hearing intact Oropharynx- clear Neck- supple, no JVP Lymph- no cervical lymphadenopathy Lungs- Clear to ausculation bilaterally, normal work  of breathing Heart- regular rate and rhythm, no murmurs, rubs or gallops, PMI not laterally displaced GI- soft, NT, ND, + BS Extremities- no clubbing, cyanosis, or edema MS- no significant deformity or atrophy Skin- no rash or lesion Psych- euthymic mood, full affect Neuro- strength and sensation are intact  EKG-sinus brady at 56 bpm, pr int 160 ms, qrs int 90 ms, qtc 389 ms Epic records reviewed  Assessment and Plan: 1. Persistent afib Pt has required cardioversion in past to restore SR and has failed pill in pocket flecainide x 3 Successful cardioversion 10/24 Continue metoprolol Encouraged to stay on xarelto long term with CHA2DS2VASc score of 2  Is currently out of work without insurance which limits options to restore SR long term with ablation or antiarrythmic's but pt is looking at options    F/u Dr. Caryl Comes as scheduled in 1/18 on recall or Dr. Bronson Ing afib clinic as needed   Geroge Baseman. Raylon Lamson, [facility] [address] [place], [place] [address] [phone]

## 2016-07-25 ENCOUNTER — Emergency Department (HOSPITAL_COMMUNITY)
Admission: EM | Admit: 2016-07-25 | Discharge: 2016-07-25 | Disposition: A | Discharge status: Home / Self Care | Payer: Self-pay | Attending: Emergency Medicine | Admitting: Emergency Medicine

## 2016-07-25 ENCOUNTER — Encounter (HOSPITAL_COMMUNITY): Payer: Self-pay | Admitting: *Deleted

## 2016-07-25 ENCOUNTER — Emergency Department (HOSPITAL_COMMUNITY): Payer: Self-pay

## 2016-07-25 DIAGNOSIS — I10 Essential (primary) hypertension: Secondary | ICD-10-CM | POA: Insufficient documentation

## 2016-07-25 DIAGNOSIS — Z79899 Other long term (current) drug therapy: Secondary | ICD-10-CM | POA: Insufficient documentation

## 2016-07-25 DIAGNOSIS — Z7901 Long term (current) use of anticoagulants: Secondary | ICD-10-CM | POA: Insufficient documentation

## 2016-07-25 DIAGNOSIS — Z7984 Long term (current) use of oral hypoglycemic drugs: Secondary | ICD-10-CM | POA: Insufficient documentation

## 2016-07-25 DIAGNOSIS — I480 Paroxysmal atrial fibrillation: Secondary | ICD-10-CM

## 2016-07-25 LAB — COMPREHENSIVE METABOLIC PANEL
ALT: 42 U/L (ref 17–63)
AST: 33 U/L (ref 15–41)
Albumin: 4.2 g/dL (ref 3.5–5.0)
Alkaline Phosphatase: 105 U/L (ref 38–126)
Anion gap: 10 (ref 5–15)
BUN: 19 mg/dL (ref 6–20)
CHLORIDE: 104 mmol/L (ref 101–111)
CO2: 28 mmol/L (ref 22–32)
CREATININE: 1.12 mg/dL (ref 0.61–1.24)
Calcium: 9.5 mg/dL (ref 8.9–10.3)
Glucose, Bld: 151 mg/dL — ABNORMAL HIGH (ref 65–99)
POTASSIUM: 4.2 mmol/L (ref 3.5–5.1)
Sodium: 142 mmol/L (ref 135–145)
Total Bilirubin: 1.5 mg/dL — ABNORMAL HIGH (ref 0.3–1.2)
Total Protein: 7.4 g/dL (ref 6.5–8.1)

## 2016-07-25 LAB — CBC WITH DIFFERENTIAL/PLATELET
Basophils Absolute: 0 10*3/uL (ref 0.0–0.1)
Basophils Relative: 1 %
EOS PCT: 3 %
Eosinophils Absolute: 0.2 10*3/uL (ref 0.0–0.7)
HCT: 42.9 % (ref 39.0–52.0)
Hemoglobin: 14.1 g/dL (ref 13.0–17.0)
LYMPHS ABS: 1.6 10*3/uL (ref 0.7–4.0)
LYMPHS PCT: 21 %
MCH: 29 pg (ref 26.0–34.0)
MCHC: 32.9 g/dL (ref 30.0–36.0)
MCV: 88.1 fL (ref 78.0–100.0)
MONO ABS: 0.7 10*3/uL (ref 0.1–1.0)
Monocytes Relative: 10 %
Neutro Abs: 4.8 10*3/uL (ref 1.7–7.7)
Neutrophils Relative %: 65 %
PLATELETS: 256 10*3/uL (ref 150–400)
RBC: 4.87 MIL/uL (ref 4.22–5.81)
RDW: 13.3 % (ref 11.5–15.5)
WBC: 7.4 10*3/uL (ref 4.0–10.5)

## 2016-07-25 LAB — TROPONIN I

## 2016-07-25 MED ORDER — DILTIAZEM HCL 100 MG IV SOLR
5.0000 mg/h | Freq: Once | INTRAVENOUS | Status: AC
  Administered 2016-07-25: 5 mg/h via INTRAVENOUS
  Filled 2016-07-25: qty 100

## 2016-07-25 MED ORDER — DILTIAZEM HCL 25 MG/5ML IV SOLN
10.0000 mg | Freq: Once | INTRAVENOUS | Status: AC
  Administered 2016-07-25: 10 mg via INTRAVENOUS

## 2016-07-25 NOTE — ED Notes (Signed)
Per Dr. Roderic Palau: pt to call ER back with how many mg Cardizem pt has at home. Dr. Roderic Palau will advise how to take at home.

## 2016-07-25 NOTE — ED Provider Notes (Signed)
[facility] DEPT Provider Note   CSN: [id number] Arrival date & time: 07/25/16  1958     History   Chief Complaint Chief Complaint  Patient presents with  . Atrial Fibrillation    HPI Bradley Rodriguez is a 56 y.o. male.  Patient complains of irregular heartbeat. Patient states that he has a history of going in and out of atrial fibrillation   The history is provided by the patient. No language interpreter was used.  Atrial Fibrillation  This is a recurrent problem. The current episode started 3 to 5 hours ago. The problem occurs constantly. The problem has not changed since onset.Pertinent negatives include no chest pain, no abdominal pain and no headaches. Nothing aggravates the symptoms. Nothing relieves the symptoms. He has tried nothing for the symptoms.    Past Medical History:  Diagnosis Date  . A-fib [facility])    DCCV 2009, 2012, 2014, Nov 2016  . Anxiety   . History of kidney stones   . HX: anticoagulation    very remotely and briefly on COumadin aound one of his CV, 2014 on Eliquis had hematuria with renal calculi and stopped  . Hyperlipidemia    patient denies this  . Hypertension   . IBS (irritable bowel syndrome)   . Normal cardiac stress test    Normal nuclear stress test , 2001    Patient Active Problem List   Diagnosis Date Noted  . Ejection fraction   . Normal cardiac stress test   . HX: anticoagulation   . S/P vasectomy   . A-fib ([place])   . Hyperlipidemia   . Anxiety   . IBS (irritable bowel syndrome)     Past Surgical History:  Procedure Laterality Date  . CARDIOVERSION N/A 10/15/2012   Procedure: CARDIOVERSION;  Surgeon: Larey Dresser, MD;  Location: [facility] ENDOSCOPY;  Service: Cardiovascular;  Laterality: N/A;  . CARDIOVERSION N/A 11/26/2014   Procedure: CARDIOVERSION;  Surgeon: Arnoldo Lenis, MD;  Location: [facility];  Service: Endoscopy;  Laterality: N/A;  . CARDIOVERSION N/A 11/15/2015   Procedure: CARDIOVERSION;  Surgeon: Jerline Pain, MD;   Location: [place];  Service: Cardiovascular;  Laterality: N/A;  . CATARACT EXTRACTION W/PHACO Right 01/18/2014   Procedure: CATARACT EXTRACTION PHACO AND INTRAOCULAR LENS PLACEMENT ([place]);  Surgeon: Tonny Branch, MD;  Location: [facility];  Service: Ophthalmology;  Laterality: Right;  CDE 9.95  . EYE SURGERY Right    detached retina  . FOOT SURGERY Left   . KNEE ARTHROSCOPY Right   . LITHOTRIPSY     for kidney stones 2002  . TEE WITHOUT CARDIOVERSION N/A 11/26/2014   Procedure: TRANSESOPHAGEAL ECHOCARDIOGRAM (TEE) WITH PROPOFOL;  Surgeon: Arnoldo Lenis, MD;  Location[facility];  Service: Endoscopy;  Laterality: N/A;  . TEE WITHOUT CARDIOVERSION N/A 11/15/2015   Procedure: TRANSESOPHAGEAL ECHOCARDIOGRAM (TEE);  Surgeon: Jerline Pain, MD;  Location: [facility] ENDOSCOPY;  Service: Cardiovascular;  Laterality: N/A;  . VASECTOMY         Home Medications    Prior to Admission medications   Medication Sig Start Date End Date Taking? Authorizing Provider  allopurinol (ZYLOPRIM) 300 MG tablet Take 1 tablet by mouth daily.  09/29/14  Yes [provider]  amLODipine (NORVASC) 5 MG tablet Take 5 mg by mouth daily. 09/29/14  Yes [provider]  atorvastatin (LIPITOR) 20 MG tablet Take 20 mg by mouth every morning.   Yes [provider]  calcium gluconate 500 MG tablet Take 1 tablet by mouth daily.  Yes [provider]  losartan (COZAAR) 100 MG tablet Take 100 mg by mouth daily.   Yes [provider]  metFORMIN (GLUCOPHAGE-XR) 500 MG 24 hr tablet Take 1 tablet by mouth daily. 01/03/15  Yes [provider]  metoprolol (LOPRESSOR) 100 MG tablet Take 1 tablet (100 mg total) by mouth 2 (two) times daily. 11/09/15 07/25/16 Yes Herminio Commons, MD  rivaroxaban (XARELTO) 20 MG TABS tablet Take 1 tablet (20 mg total) by mouth daily with supper. 11/15/15  Yes Sherran Needs, NP  Vitamin D, Ergocalciferol, (DRISDOL) 50000 UNITS CAPS capsule Take 50,000 Units  by mouth every Wednesday.  10/27/14  Yes [provider]    Family History Family History  Problem Relation Age of Onset  . Hypertension Unknown     Social History Social History  Substance Use Topics  . Smoking status: Never Smoker  . Smokeless tobacco: Never Used  . Alcohol use No     Allergies   Ace inhibitors; Lisinopril; and Quinine derivatives   Review of Systems Review of Systems  Constitutional: Negative for appetite change and fatigue.  HENT: Negative for congestion, ear discharge and sinus pressure.   Eyes: Negative for discharge.  Respiratory: Negative for cough.   Cardiovascular: Negative for chest pain.       Irregular heartbeat  Gastrointestinal: Negative for abdominal pain and diarrhea.  Genitourinary: Negative for frequency and hematuria.  Musculoskeletal: Negative for back pain.  Skin: Negative for rash.  Neurological: Negative for seizures and headaches.  Psychiatric/Behavioral: Negative for hallucinations.     Physical Exam Updated Vital Signs BP 107/88 (BP Location: Right Arm)   Pulse 65   Temp 98 F (36.7 C) (Oral)   Resp 19   Ht 6\' 8"  (2.032 m)   Wt (!) 152.4 kg (336 lb)   SpO2 95%   BMI 36.91 kg/m   Physical Exam  Constitutional: He is oriented to person, place, and time. He appears well-developed.  HENT:  Head: Normocephalic.  Eyes: Conjunctivae and EOM are normal. No scleral icterus.  Neck: Neck supple. No thyromegaly present.  Cardiovascular: Exam reveals no gallop and no friction rub.   No murmur heard. Rapid irregular heartbeat  Pulmonary/Chest: No stridor. He has no wheezes. He has no rales. He exhibits no tenderness.  Abdominal: He exhibits no distension. There is no tenderness. There is no rebound.  Musculoskeletal: Normal range of motion. He exhibits no edema.  Lymphadenopathy:    He has no cervical adenopathy.  Neurological: He is oriented to person, place, and time. He exhibits normal muscle tone. Coordination  normal.  Skin: No rash noted. No erythema.  Psychiatric: He has a normal mood and affect. His behavior is normal.     ED Treatments / Results  Labs (all labs ordered are listed, but only abnormal results are displayed) Labs Reviewed  COMPREHENSIVE METABOLIC PANEL - Abnormal; Notable for the following:       Result Value   Glucose, Bld 151 (*)    Total Bilirubin 1.5 (*)    All other components within normal limits  CBC WITH DIFFERENTIAL/PLATELET  TROPONIN I    EKG  EKG Interpretation None       Radiology Dg Chest Portable 1 View  Result Date: 07/25/2016 CLINICAL DATA:  Shortness of breath.  Palpitations. EXAM: PORTABLE CHEST 1 VIEW COMPARISON:  11/08/2015 FINDINGS: The cardiomediastinal contours are unchanged. Prominent cardiac silhouette likely accentuated by AP technique. Pulmonary vasculature is normal. No consolidation, pleural effusion, or pneumothorax. No  acute osseous abnormalities are seen. IMPRESSION: Prominent cardiac silhouette likely related to AP technique. No congestive failure or acute pulmonary process. Electronically Signed   By: Jeb Levering M.D.   On: 07/25/2016 20:52    Procedures Procedures (including critical care time)  Medications Ordered in ED Medications  diltiazem (CARDIZEM) injection 10 mg (10 mg Intravenous Given 07/25/16 2110)  diltiazem (CARDIZEM) 100 mg in dextrose 5 % 100 mL (1 mg/mL) infusion (5 mg/hr Intravenous New Bag/Given 07/25/16 2107)     Initial Impression / Assessment and Plan / ED Course  I have reviewed the triage vital signs and the nursing notes.  Pertinent labs & imaging results that were available during my care of the patient were reviewed by me and considered in my medical decision making (see chart for details).     Patient's rapid atrial flutter was controlled with IV Cardizem.  Patient was going to be admitted for rapid atrial fib. Patient said he had no health insurance and wanted to go home and follow-up with his  cardiologist. Patient has Cardizem to take at home in these instances.  Final Clinical Impressions(s) / ED Diagnoses   Final diagnoses:  Paroxysmal atrial fibrillation [facility])    New Prescriptions New Prescriptions   No medications on file     Milton Ferguson, MD 07/25/16 2244

## 2016-07-25 NOTE — Discharge Instructions (Signed)
Call your cardiologist for follow up.   Start taking the cardiazem you have at home

## 2016-07-25 NOTE — ED Triage Notes (Signed)
Pt felt himself go into afib while at church tonight.

## 2016-07-26 ENCOUNTER — Telehealth: Payer: Self-pay | Admitting: Cardiovascular Disease

## 2016-07-26 NOTE — Telephone Encounter (Signed)
Patient was in [place] last night with AFIB.   He would like to set up a cardioversion to be done at [facility].

## 2016-07-26 NOTE — Telephone Encounter (Signed)
Last seen by Dr. Bronson Ing on 11/09/2015 - disposition stated to follow prn for him & referred to Afib clinic.  Do you want to see him or defer to Afib clinic Roderic Palau)?

## 2016-07-26 NOTE — Telephone Encounter (Signed)
Appointment scheduled for tomorrow morning at 9:00 am in AFib Clinic.  Patient notified.  Parking garage code is [id number].

## 2016-07-26 NOTE — Telephone Encounter (Signed)
Would recommend he be evaluated in a fib clinic.

## 2016-07-27 ENCOUNTER — Encounter (HOSPITAL_COMMUNITY): Payer: Self-pay | Admitting: Nurse Practitioner

## 2016-07-27 ENCOUNTER — Ambulatory Visit (HOSPITAL_COMMUNITY)
Admission: RE | Admit: 2016-07-27 | Discharge: 2016-07-27 | Disposition: A | Discharge status: Home / Self Care | Payer: Self-pay | Source: Ambulatory Visit | Attending: Nurse Practitioner | Admitting: Nurse Practitioner

## 2016-07-27 ENCOUNTER — Other Ambulatory Visit (HOSPITAL_COMMUNITY): Payer: Self-pay | Admitting: *Deleted

## 2016-07-27 VITALS — BP 118/76 | HR 92 | Ht >= 80 in | Wt 347.0 lb

## 2016-07-27 DIAGNOSIS — Z7984 Long term (current) use of oral hypoglycemic drugs: Secondary | ICD-10-CM | POA: Insufficient documentation

## 2016-07-27 DIAGNOSIS — Z7901 Long term (current) use of anticoagulants: Secondary | ICD-10-CM | POA: Insufficient documentation

## 2016-07-27 DIAGNOSIS — Z79899 Other long term (current) drug therapy: Secondary | ICD-10-CM | POA: Insufficient documentation

## 2016-07-27 DIAGNOSIS — E785 Hyperlipidemia, unspecified: Secondary | ICD-10-CM | POA: Insufficient documentation

## 2016-07-27 DIAGNOSIS — Z87442 Personal history of urinary calculi: Secondary | ICD-10-CM | POA: Insufficient documentation

## 2016-07-27 DIAGNOSIS — I481 Persistent atrial fibrillation: Secondary | ICD-10-CM | POA: Insufficient documentation

## 2016-07-27 DIAGNOSIS — I10 Essential (primary) hypertension: Secondary | ICD-10-CM | POA: Insufficient documentation

## 2016-07-27 DIAGNOSIS — E119 Type 2 diabetes mellitus without complications: Secondary | ICD-10-CM | POA: Insufficient documentation

## 2016-07-27 DIAGNOSIS — I4819 Other persistent atrial fibrillation: Secondary | ICD-10-CM

## 2016-07-27 DIAGNOSIS — Z9889 Other specified postprocedural states: Secondary | ICD-10-CM | POA: Insufficient documentation

## 2016-07-27 MED ORDER — DILTIAZEM HCL 30 MG PO TABS: ORAL_TABLET | ORAL | 1 refills | Status: DC

## 2016-07-27 NOTE — Progress Notes (Signed)
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 male with a h/o, HTN, DM, h/o paroxysmal  afib that has been occurring since 1996. He states it came on the day after he was shocked working on a vacuum cleaner. No other afib in family. He usually requires cardioversion one time a year. He was in [place] Wednesday night and was given flecainde pill in pocket 300 mg and it did not work. He states that this is the third time that he has taken this in the ER and it has failed to restore SR. Afib makes him feel fatigued, mildly short of breath. Per a note from his previous cardiologist, Dr. Ron Parker in 2014, he put him on daily flecainide and referred to Dr. Rayann Heman for ablation. The pt states he does not remember taking flecainide daily and for some reason never saw Dr. Rayann Heman.  He has been off and on blood thinners, over the years, usually just long enough to satisfy requirements for cardioversion despite current chadsvasc score of 2(HTN,DM). He states that both of his parents have have GI bleeds and etiology was never determined  for either one of them, so he was afraid to continue blood thinners. He did start back on xarelto 20 mg Wednesday pm, when he developed afib.On metoprolol daily.  Review of lifestyle reveals no alcohol, or tobacco use. Denies snoring. Small caffeine use. Not very physically  active due to ankle/knee issues. He did lose 60 lbs in the past as part of a depression, gained 30 back. He is 6'8" and carries his weight well He reports weighing 320 lbs on graduation from high school, weight has been fairly consistent over the years.. Has a brother and father with similar builds. TEE in 2016 showed normal function and mild left atrial size.  Unfortunately, he lost his job working with the [employer] in [place] a few months ago and does not have insurance. We discussed various options to manage afib but he cannot afford  hospitalization for sotalol or tikosyn and is interested in ablation, but again cost is an issue. Not a good candidate  for amiodarone with low afib burden and relatively young age.  F/u in afib clinic s/p successful cardioversion and is maintaining SR. He continues on xarelto and reminded him I would like him to stay on long term with chadsvasc score of 2. He is trying to obtain insurance with being laid off work earlier this year. If so, he may have more options going forward if he does return to afib.  F/u in afib clinic, 07/27/16. Pt has returned to afib. This occurred 7/4 at 6:30 pm. He thinks mental stress over a family situation contributed to this. He had cardioversion that held since last November. One a year has mostly been his pattern. He still is without medical insurance after being laid off from previous job last year, which greatly limits his options to treat afib. Getting occasional cardioversion has been his most affortable option.Marland Kitchen He did recently get disability from joint issues, chronic knee/ ankle issues.He has been consistently on xarelto, no missed doses x 3 weeks.  Today, he denies symptoms of palpitations, chest pain, shortness of breath, orthopnea, PND, lower extremity edema, dizziness, presyncope, syncope, or neurologic sequela. Positive for fatigue. The patient is tolerating medications without difficulties and is otherwise without complaint today.   Past Medical History:  Diagnosis Date  . A-fib ([place])  DCCV 2009, 2012, 2014, Nov 2016  . Anxiety   . History of kidney stones   . HX: anticoagulation    very remotely and briefly on COumadin aound one of his CV, 2014 on Eliquis had hematuria with renal calculi and stopped  . Hyperlipidemia    patient denies this  . Hypertension   . IBS (irritable bowel syndrome)   . Normal cardiac stress test    Normal nuclear stress test , 2001   Past Surgical History:  Procedure Laterality Date  . CARDIOVERSION N/A 10/15/2012    Procedure: CARDIOVERSION;  Surgeon: Larey Dresser, MD;  Location: [facility] ENDOSCOPY;  Service: Cardiovascular;  Laterality: N/A;  . CARDIOVERSION N/A 11/26/2014   Procedure: CARDIOVERSION;  Surgeon: Arnoldo Lenis, MD;  Location: [facility] ORS;  Service: Endoscopy;  Laterality: N/A;  . CARDIOVERSION N/A 11/15/2015   Procedure: CARDIOVERSION;  Surgeon: Jerline Pain, MD;  Location: [place];  Service: Cardiovascular;  Laterality: N/A;  . CATARACT EXTRACTION W/PHACO Right 01/18/2014   Procedure: CATARACT EXTRACTION PHACO AND INTRAOCULAR LENS PLACEMENT ([place]);  Surgeon: Tonny Branch, MD;  Location: [facility] ORS;  Service: Ophthalmology;  Laterality: Right;  CDE 9.95  . EYE SURGERY Right    detached retina  . FOOT SURGERY Left   . KNEE ARTHROSCOPY Right   . LITHOTRIPSY     for kidney stones 2002  . TEE WITHOUT CARDIOVERSION N/A 11/26/2014   Procedure: TRANSESOPHAGEAL ECHOCARDIOGRAM (TEE) WITH PROPOFOL;  Surgeon: Arnoldo Lenis, MD;  Location: [facility];  Service: Endoscopy;  Laterality: N/A;  . TEE WITHOUT CARDIOVERSION N/A 11/15/2015   Procedure: TRANSESOPHAGEAL ECHOCARDIOGRAM (TEE);  Surgeon: Jerline Pain, MD;  Location: [facility] ENDOSCOPY;  Service: Cardiovascular;  Laterality: N/A;  . VASECTOMY      Current Outpatient Prescriptions  Medication Sig Dispense Refill  . allopurinol (ZYLOPRIM) 300 MG tablet Take 1 tablet by mouth daily.   3  . amLODipine (NORVASC) 5 MG tablet Take 5 mg by mouth daily.  3  . atorvastatin (LIPITOR) 20 MG tablet Take 20 mg by mouth every morning.    . calcium gluconate 500 MG tablet Take 1 tablet by mouth daily.    Marland Kitchen diltiazem (CARDIZEM) 30 MG tablet Take 30 mg by mouth daily as needed.    Marland Kitchen losartan (COZAAR) 100 MG tablet Take 100 mg by mouth daily.    . metFORMIN (GLUCOPHAGE-XR) 500 MG 24 hr tablet Take 1 tablet by mouth daily.  3  . metoprolol (LOPRESSOR) 100 MG tablet Take 1 tablet (100 mg total) by mouth 2 (two) times daily. 180 tablet 3  . rivaroxaban (XARELTO) 20 MG TABS  tablet Take 1 tablet (20 mg total) by mouth daily with supper. 30 tablet 11  . Vitamin D, Ergocalciferol, (DRISDOL) 50000 UNITS CAPS capsule Take 50,000 Units by mouth every Wednesday.      No current facility-administered medications for this encounter.     Allergies  Allergen Reactions  . Ace Inhibitors Cough  . Lisinopril Cough  . Quinine Derivatives Rash    Social History   Social History  . Marital status: Married    Spouse name: N/A  . Number of children: N/A  . Years of education: N/A   Occupational History  . Not on file.   Social History Main Topics  . Smoking status: Never Smoker  . Smokeless tobacco: Never Used  . Alcohol use No  . Drug use: No  . Sexual activity: Yes    Birth control/ protection: Surgical  Other Topics Concern  . Not on file   Social History Narrative  . No narrative on file    Family History  Problem Relation Age of Onset  . Hypertension Unknown     ROS- All systems are reviewed and negative except as per the HPI above  Physical Exam: Vitals:   07/27/16 0842  BP: 118/76  Pulse: 92  Weight: (!) 347 lb (157.4 kg)  Height: 6\' 8"  (2.032 m)    GEN- The patient is well appearing, alert and oriented x 3 today.   Head- normocephalic, atraumatic Eyes-  Sclera clear, conjunctiva pink Ears- hearing intact Oropharynx- clear Neck- supple, no JVP Lymph- no cervical lymphadenopathy Lungs- Clear to ausculation bilaterally, normal work of breathing Heart- irregular rate and rhythm, no murmurs, rubs or gallops, PMI not laterally displaced GI- soft, NT, ND, + BS Extremities- no clubbing, cyanosis, or edema MS- no significant deformity or atrophy Skin- no rash or lesion Psych- euthymic mood, full affect Neuro- strength and sensation are intact  EKG-afib at 92 bpm,  qrs int 94 ms, qtc 445 ms Epic records reviewed  Assessment and Plan: 1. Persistent afib, mildly symptomatic Rate controlled Pt has required cardioversion in past to  restore SR and has failed pill in pocket flecainide x 3 Successful cardioversion 11/17 Continue metoprolol daily and cardizem 30 mg as needed for HR over 100 Encouraged to stay on xarelto long term with [id number] score of 2 , states no missed doses x 3 weeks Pt would prefer ablation but is price prohibitive without any medical coverage Is out of work without medical insurance which limits options to restore SR long term with ablation or antiarrythmic's, on young side for amiodarone Social worker is not here today but will discuss on Monday to see if pt has any options with assistance with [facility]. He does not live in [place] so he does not qualify for the [facility] card. He states after he has received disability for 2 years, he will be eligible to have  medical procedures covered.  If no assistance is available, he does want to go ahead with cardioversion and  we will call Dr. Court Joy office for assistance to schedule this at [facility] per pt request.  Will discuss further with pt on Monday.  Geroge Baseman Laporsche Hoeger, [facility] [address] [place], [place] [address] [phone]

## 2016-08-01 ENCOUNTER — Telehealth: Payer: Self-pay

## 2016-08-01 ENCOUNTER — Other Ambulatory Visit: Payer: Self-pay | Admitting: Cardiovascular Disease

## 2016-08-01 ENCOUNTER — Other Ambulatory Visit (HOSPITAL_COMMUNITY): Payer: Self-pay | Admitting: *Deleted

## 2016-08-01 DIAGNOSIS — Z01818 Encounter for other preprocedural examination: Secondary | ICD-10-CM

## 2016-08-01 NOTE — Patient Instructions (Addendum)
ABHINAV MAYORQUIN  08/01/2016     @PREFPERIOPPHARMACY @   Your procedure is scheduled on 08/07/2016.  Report to [facility] at 8:30 A.M.  Call this number if you have problems the morning of surgery:  [phone]   Remember:  Do not eat food or drink liquids after midnight.  Take these medicines the morning of surgery with A SIP OF WATER Allopurinol, Amlodiopine, Cardizem, Losartan, XARELTO - Dont miss a dose    DO NOT TAKE DIABETIC MEDICATION MORNING OF PROCEDURE   DO NOT TAKE METOPROLOL AM OF PROCEDURE PER MD INSTRUCTION, PER PATIENT   Do not wear jewelry, make-up or nail polish.  Do not wear lotions, powders, or perfumes, or deoderant.  Do not shave 48 hours prior to surgery.  Men may shave face and neck.  Do not bring valuables to the hospital.  [facility] is not responsible for any belongings or valuables.  Contacts, dentures or bridgework may not be worn into surgery.  Leave your suitcase in the car.  After surgery it may be brought to your room.  For patients admitted to the hospital, discharge time will be determined by your treatment team.  Patients discharged the day of surgery will not be allowed to drive home.    Please read over the following fact sheets that you were given. Anesthesia Post-op Instructions     PATIENT INSTRUCTIONS POST-ANESTHESIA  IMMEDIATELY FOLLOWING SURGERY:  Do not drive or operate machinery for the first twenty four hours after surgery.  Do not make any important decisions for twenty four hours after surgery or while taking narcotic pain medications or sedatives.  If you develop intractable nausea and vomiting or a severe headache please notify your doctor immediately.  FOLLOW-UP:  Please make an appointment with your surgeon as instructed. You do not need to follow up with anesthesia unless specifically instructed to do so.  WOUND CARE INSTRUCTIONS (if applicable):  Keep a dry clean dressing on the anesthesia/puncture wound site if there is  drainage.  Once the wound has quit draining you may leave it open to air.  Generally you should leave the bandage intact for twenty four hours unless there is drainage.  If the epidural site drains for more than 36-48 hours please call the anesthesia department.  QUESTIONS?:  Please feel free to call your physician or the hospital operator if you have any questions, and they will be happy to assist you.      Electrical Cardioversion Electrical cardioversion is the delivery of a jolt of electricity to restore a normal rhythm to the heart. A rhythm that is too fast or is not regular keeps the heart from pumping well. In this procedure, sticky patches or metal paddles are placed on the chest to deliver electricity to the heart from a device. This procedure may be done in an emergency if:  There is low or no blood pressure as a result of the heart rhythm.  Normal rhythm must be restored as fast as possible to protect the brain and heart from further damage.  It may save a life.  This procedure may also be done for irregular or fast heart rhythms that are not immediately life-threatening. Tell a health care provider about:  Any allergies you have.  All medicines you are taking, including vitamins, herbs, eye drops, creams, and over-the-counter medicines.  Any problems you or family members have had with anesthetic medicines.  Any blood disorders you have.  Any surgeries you have had.  Any medical conditions you have.  Whether you are pregnant or may be pregnant. What are the risks? Generally, this is a safe procedure. However, problems may occur, including:  Allergic reactions to medicines.  A blood clot that breaks free and travels to other parts of your body.  The possible return of an abnormal heart rhythm within hours or days after the procedure.  Your heart stopping (cardiac arrest). This is rare.  What happens before the procedure? Medicines  Your health care provider  may have you start taking: ? Blood-thinning medicines (anticoagulants) so your blood does not clot as easily. ? Medicines may be given to help stabilize your heart rate and rhythm.  Ask your health care provider about changing or stopping your regular medicines. This is especially important if you are taking diabetes medicines or blood thinners. General instructions  Plan to have someone take you home from the hospital or clinic.  If you will be going home right after the procedure, plan to have someone with you for 24 hours.  Follow instructions from your health care provider about eating or drinking restrictions. What happens during the procedure?  To lower your risk of infection: ? Your health care team will wash or sanitize their hands. ? Your skin will be washed with soap.  An IV tube will be inserted into one of your veins.  You will be given a medicine to help you relax (sedative).  Sticky patches (electrodes) or metal paddles may be placed on your chest.  An electrical shock will be delivered. The procedure may vary among health care providers and hospitals. What happens after the procedure?  Your blood pressure, heart rate, breathing rate, and blood oxygen level will be monitored until the medicines you were given have worn off.  Do not drive for 24 hours if you were given a sedative.  Your heart rhythm will be watched to make sure it does not change. This information is not intended to replace advice given to you by your health care provider. Make sure you discuss any questions you have with your health care provider. Document Released: 12/29/2001 Document Revised: 09/07/2015 Document Reviewed: 07/15/2015 Elsevier Interactive Patient Education  2017 [employer].

## 2016-08-01 NOTE — Telephone Encounter (Signed)
-----   Message from Orinda Kenner sent at 07/31/2016  9:39 AM EDT ----- Regarding: RE: help with scheduling cardioversion Cardioversion scheduled for 7/17 @ 10:00am. Patient needs to register @ [facility] @ 8:30am.  Pre op scheduled for Friday 7/13 @ 12:45 @ [facility].  Patient will need orders put in prior to 7/13.  Thanks, Coralyn Mark ----- Message ----- From: Drema Dallas, CMA Sent: 07/30/2016  11:42 AM To: Manson Passey Subject: FW: help with scheduling cardioversion         Could you please schedule this. Let me know date/time and I will call him with instructions.  Thanks! Lattie Haw  ----- Message ----- From: Juluis Mire, RN Sent: 07/30/2016  11:17 AM To: Cristi Loron Triage Subject: help with scheduling cardioversion             We saw this patient for Dr. Jetty Duhamel on Friday - the patient needs cardioversion but would like this performed at [facility]. Would someone be able to help me get this scheduled for him? The patient can do any time/day. Thanks so much! Stacy RN AFib Clinic

## 2016-08-01 NOTE — Telephone Encounter (Signed)
Put in labs for pre-op testing prior to cardioversion. Pt notified.

## 2016-08-03 ENCOUNTER — Encounter (HOSPITAL_COMMUNITY)
Admission: RE | Admit: 2016-08-03 | Discharge: 2016-08-03 | Disposition: A | Discharge status: Home / Self Care | Payer: Self-pay | Source: Ambulatory Visit | Attending: Cardiovascular Disease | Admitting: Cardiovascular Disease

## 2016-08-03 ENCOUNTER — Encounter (HOSPITAL_COMMUNITY): Payer: Self-pay

## 2016-08-07 ENCOUNTER — Ambulatory Visit (HOSPITAL_COMMUNITY)
Admission: RE | Admit: 2016-08-07 | Discharge: 2016-08-07 | Disposition: A | Discharge status: Home / Self Care | Payer: Self-pay | Source: Ambulatory Visit | Attending: Cardiovascular Disease | Admitting: Cardiovascular Disease

## 2016-08-07 ENCOUNTER — Ambulatory Visit (HOSPITAL_COMMUNITY): Payer: Self-pay | Admitting: Anesthesiology

## 2016-08-07 ENCOUNTER — Encounter (HOSPITAL_COMMUNITY)
Admission: RE | Disposition: A | Discharge status: Home / Self Care | Payer: Self-pay | Source: Ambulatory Visit | Attending: Cardiovascular Disease

## 2016-08-07 ENCOUNTER — Encounter (HOSPITAL_COMMUNITY): Payer: Self-pay | Admitting: *Deleted

## 2016-08-07 DIAGNOSIS — I481 Persistent atrial fibrillation: Secondary | ICD-10-CM | POA: Insufficient documentation

## 2016-08-07 DIAGNOSIS — E785 Hyperlipidemia, unspecified: Secondary | ICD-10-CM | POA: Insufficient documentation

## 2016-08-07 DIAGNOSIS — Z87442 Personal history of urinary calculi: Secondary | ICD-10-CM | POA: Insufficient documentation

## 2016-08-07 DIAGNOSIS — Z7901 Long term (current) use of anticoagulants: Secondary | ICD-10-CM | POA: Insufficient documentation

## 2016-08-07 DIAGNOSIS — K589 Irritable bowel syndrome without diarrhea: Secondary | ICD-10-CM | POA: Insufficient documentation

## 2016-08-07 DIAGNOSIS — I480 Paroxysmal atrial fibrillation: Secondary | ICD-10-CM | POA: Insufficient documentation

## 2016-08-07 DIAGNOSIS — E119 Type 2 diabetes mellitus without complications: Secondary | ICD-10-CM | POA: Insufficient documentation

## 2016-08-07 DIAGNOSIS — F419 Anxiety disorder, unspecified: Secondary | ICD-10-CM | POA: Insufficient documentation

## 2016-08-07 DIAGNOSIS — Z888 Allergy status to other drugs, medicaments and biological substances status: Secondary | ICD-10-CM | POA: Insufficient documentation

## 2016-08-07 DIAGNOSIS — I10 Essential (primary) hypertension: Secondary | ICD-10-CM | POA: Insufficient documentation

## 2016-08-07 DIAGNOSIS — Z79899 Other long term (current) drug therapy: Secondary | ICD-10-CM | POA: Insufficient documentation

## 2016-08-07 DIAGNOSIS — Z7984 Long term (current) use of oral hypoglycemic drugs: Secondary | ICD-10-CM | POA: Insufficient documentation

## 2016-08-07 HISTORY — PX: CARDIOVERSION: SHX1299

## 2016-08-07 LAB — GLUCOSE, CAPILLARY: GLUCOSE-CAPILLARY: 150 mg/dL — AB (ref 65–99)

## 2016-08-07 SURGERY — CARDIOVERSION
Anesthesia: Monitor Anesthesia Care

## 2016-08-07 MED ORDER — PROPOFOL 500 MG/50ML IV EMUL
INTRAVENOUS | Status: DC | PRN
  Administered 2016-08-07: 125 ug/kg/min via INTRAVENOUS

## 2016-08-07 MED ORDER — PROPOFOL 10 MG/ML IV BOLUS
INTRAVENOUS | Status: DC | PRN
  Administered 2016-08-07: 10 mg via INTRAVENOUS

## 2016-08-07 MED ORDER — PROPOFOL 10 MG/ML IV BOLUS
INTRAVENOUS | Status: AC
  Filled 2016-08-07: qty 40

## 2016-08-07 MED ORDER — LACTATED RINGERS IV SOLN
INTRAVENOUS | Status: DC
  Administered 2016-08-07: 09:00:00 via INTRAVENOUS

## 2016-08-07 NOTE — H&P (View-Only) (Signed)
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 male with a h/o, HTN, DM, h/o paroxysmal  afib that has been occurring since 1996. He states it came on the day after he was shocked working on a vacuum cleaner. No other afib in family. He usually requires cardioversion one time a year. He was in [place] Wednesday night and was given flecainde pill in pocket 300 mg and it did not work. He states that this is the third time that he has taken this in the ER and it has failed to restore SR. Afib makes him feel fatigued, mildly short of breath. Per a note from his previous cardiologist, Dr. Ron Parker in 2014, he put him on daily flecainide and referred to Dr. Rayann Heman for ablation. The pt states he does not remember taking flecainide daily and for some reason never saw Dr. Rayann Heman.  He has been off and on blood thinners, over the years, usually just long enough to satisfy requirements for cardioversion despite current chadsvasc score of 2(HTN,DM). He states that both of his parents have have GI bleeds and etiology was never determined  for either one of them, so he was afraid to continue blood thinners. He did start back on xarelto 20 mg Wednesday pm, when he developed afib.On metoprolol daily.  Review of lifestyle reveals no alcohol, or tobacco use. Denies snoring. Small caffeine use. Not very physically  active due to ankle/knee issues. He did lose 60 lbs in the past as part of a depression, gained 30 back. He is 6'8" and carries his weight well He reports weighing 320 lbs on graduation from high school, weight has been fairly consistent over the years.. Has a brother and father with similar builds. TEE in 2016 showed normal function and mild left atrial size.  Unfortunately, he lost his job working with the [employer] in [place] a few months ago and does not have insurance. We discussed various options to manage afib but he cannot afford  hospitalization for sotalol or tikosyn and is interested in ablation, but again cost is an issue. Not a good candidate  for amiodarone with low afib burden and relatively young age.  F/u in afib clinic s/p successful cardioversion and is maintaining SR. He continues on xarelto and reminded him I would like him to stay on long term with chadsvasc score of 2. He is trying to obtain insurance with being laid off work earlier this year. If so, he may have more options going forward if he does return to afib.  F/u in afib clinic, 07/27/16. Pt has returned to afib. This occurred 7/4 at 6:30 pm. He thinks mental stress over a family situation contributed to this. He had cardioversion that held since last November. One a year has mostly been his pattern. He still is without medical insurance after being laid off from previous job last year, which greatly limits his options to treat afib. Getting occasional cardioversion has been his most affortable option.Marland Kitchen He did recently get disability from joint issues, chronic knee/ ankle issues.He has been consistently on xarelto, no missed doses x 3 weeks.  Today, he denies symptoms of palpitations, chest pain, shortness of breath, orthopnea, PND, lower extremity edema, dizziness, presyncope, syncope, or neurologic sequela. Positive for fatigue. The patient is tolerating medications without difficulties and is otherwise without complaint today.   Past Medical History:  Diagnosis Date  . A-fib ([place])  DCCV 2009, 2012, 2014, Nov 2016  . Anxiety   . History of kidney stones   . HX: anticoagulation    very remotely and briefly on COumadin aound one of his CV, 2014 on Eliquis had hematuria with renal calculi and stopped  . Hyperlipidemia    patient denies this  . Hypertension   . IBS (irritable bowel syndrome)   . Normal cardiac stress test    Normal nuclear stress test , 2001   Past Surgical History:  Procedure Laterality Date  . CARDIOVERSION N/A 10/15/2012    Procedure: CARDIOVERSION;  Surgeon: Larey Dresser, MD;  Location: [facility] ENDOSCOPY;  Service: Cardiovascular;  Laterality: N/A;  . CARDIOVERSION N/A 11/26/2014   Procedure: CARDIOVERSION;  Surgeon: Arnoldo Lenis, MD;  Location: [facility] ORS;  Service: Endoscopy;  Laterality: N/A;  . CARDIOVERSION N/A 11/15/2015   Procedure: CARDIOVERSION;  Surgeon: Jerline Pain, MD;  Location: [place];  Service: Cardiovascular;  Laterality: N/A;  . CATARACT EXTRACTION W/PHACO Right 01/18/2014   Procedure: CATARACT EXTRACTION PHACO AND INTRAOCULAR LENS PLACEMENT ([place]);  Surgeon: Tonny Branch, MD;  Location: [facility] ORS;  Service: Ophthalmology;  Laterality: Right;  CDE 9.95  . EYE SURGERY Right    detached retina  . FOOT SURGERY Left   . KNEE ARTHROSCOPY Right   . LITHOTRIPSY     for kidney stones 2002  . TEE WITHOUT CARDIOVERSION N/A 11/26/2014   Procedure: TRANSESOPHAGEAL ECHOCARDIOGRAM (TEE) WITH PROPOFOL;  Surgeon: Arnoldo Lenis, MD;  Location: [facility] ORS;  Service: Endoscopy;  Laterality: N/A;  . TEE WITHOUT CARDIOVERSION N/A 11/15/2015   Procedure: TRANSESOPHAGEAL ECHOCARDIOGRAM (TEE);  Surgeon: Jerline Pain, MD;  Location: [facility] ENDOSCOPY;  Service: Cardiovascular;  Laterality: N/A;  . VASECTOMY      Current Outpatient Prescriptions  Medication Sig Dispense Refill  . allopurinol (ZYLOPRIM) 300 MG tablet Take 1 tablet by mouth daily.   3  . amLODipine (NORVASC) 5 MG tablet Take 5 mg by mouth daily.  3  . atorvastatin (LIPITOR) 20 MG tablet Take 20 mg by mouth every morning.    . calcium gluconate 500 MG tablet Take 1 tablet by mouth daily.    Marland Kitchen diltiazem (CARDIZEM) 30 MG tablet Take 30 mg by mouth daily as needed.    Marland Kitchen losartan (COZAAR) 100 MG tablet Take 100 mg by mouth daily.    . metFORMIN (GLUCOPHAGE-XR) 500 MG 24 hr tablet Take 1 tablet by mouth daily.  3  . metoprolol (LOPRESSOR) 100 MG tablet Take 1 tablet (100 mg total) by mouth 2 (two) times daily. 180 tablet 3  . rivaroxaban (XARELTO) 20 MG TABS  tablet Take 1 tablet (20 mg total) by mouth daily with supper. 30 tablet 11  . Vitamin D, Ergocalciferol, (DRISDOL) 50000 UNITS CAPS capsule Take 50,000 Units by mouth every Wednesday.      No current facility-administered medications for this encounter.     Allergies  Allergen Reactions  . Ace Inhibitors Cough  . Lisinopril Cough  . Quinine Derivatives Rash    Social History   Social History  . Marital status: Married    Spouse name: N/A  . Number of children: N/A  . Years of education: N/A   Occupational History  . Not on file.   Social History Main Topics  . Smoking status: Never Smoker  . Smokeless tobacco: Never Used  . Alcohol use No  . Drug use: No  . Sexual activity: Yes    Birth control/ protection: Surgical  Other Topics Concern  . Not on file   Social History Narrative  . No narrative on file    Family History  Problem Relation Age of Onset  . Hypertension Unknown     ROS- All systems are reviewed and negative except as per the HPI above  Physical Exam: Vitals:   07/27/16 0842  BP: 118/76  Pulse: 92  Weight: (!) 347 lb (157.4 kg)  Height: 6\' 8"  (2.032 m)    GEN- The patient is well appearing, alert and oriented x 3 today.   Head- normocephalic, atraumatic Eyes-  Sclera clear, conjunctiva pink Ears- hearing intact Oropharynx- clear Neck- supple, no JVP Lymph- no cervical lymphadenopathy Lungs- Clear to ausculation bilaterally, normal work of breathing Heart- irregular rate and rhythm, no murmurs, rubs or gallops, PMI not laterally displaced GI- soft, NT, ND, + BS Extremities- no clubbing, cyanosis, or edema MS- no significant deformity or atrophy Skin- no rash or lesion Psych- euthymic mood, full affect Neuro- strength and sensation are intact  EKG-afib at 92 bpm,  qrs int 94 ms, qtc 445 ms Epic records reviewed  Assessment and Plan: 1. Persistent afib, mildly symptomatic Rate controlled Pt has required cardioversion in past to  restore SR and has failed pill in pocket flecainide x 3 Successful cardioversion 11/17 Continue metoprolol daily and cardizem 30 mg as needed for HR over 100 Encouraged to stay on xarelto long term with CHA2DS2VASc score of 2 , states no missed doses x 3 weeks Pt would prefer ablation but is price prohibitive without any medical coverage Is out of work without medical insurance which limits options to restore SR long term with ablation or antiarrythmic's, on young side for amiodarone Social worker is not here today but will discuss on Monday to see if pt has any options with assistance with [facility]. He does not live in [place] so he does not qualify for the [facility] card. He states after he has received disability for 2 years, he will be eligible to have  medical procedures covered.  If no assistance is available, he does want to go ahead with cardioversion and  we will call Dr. Court Joy office for assistance to schedule this at [facility] per pt request.  Will discuss further with pt on Monday.  Geroge Baseman Micheala Morissette, [facility] [address] [place], [place] [address] [phone]

## 2016-08-07 NOTE — Transfer of Care (Signed)
Immediate Anesthesia Transfer of Care Note  Patient: Bradley Rodriguez  Procedure(s) Performed: Procedure(s): CARDIOVERSION (N/A)  Patient Location: PACU  Anesthesia Type:MAC  Level of Consciousness: awake  Airway & Oxygen Therapy: Patient Spontanous Breathing and Patient connected to nasal cannula oxygen  Post-op Assessment: Report given to RN  Post vital signs: Reviewed and stable  Last Vitals:  Vitals:   08/07/16 0915 08/07/16 0930  BP: 125/77 114/73  Resp: (!) 27 (!) 23  Temp:      Last Pain:  Vitals:   08/07/16 0825  TempSrc: Oral      Patients Stated Pain Goal: 10 (08/65/78 4696)  Complications: No apparent anesthesia complications

## 2016-08-07 NOTE — Progress Notes (Signed)
Electrical Cardioversion Procedure Note Bradley Rodriguez [id number] Jun 02, 1960  Procedure: Electrical Cardioversion Indications:  Atrial Fibrillation  Procedure Details Consent: Risks of procedure as well as the alternatives and risks of each were explained to the (patient/caregiver).  Consent for procedure obtained. Time Out: Verified patient identification, verified procedure, site/side was marked, verified correct patient position, special equipment/implants available, medications/allergies/relevent history reviewed, required imaging and test results available.  Performed  Patient placed on cardiac monitor, pulse oximetry, supplemental oxygen as necessary.  Sedation given: See Anesthesia note Pacer pads placed anterior and posterior chest.  Cardioverted 1 time(s).  Cardioverted at 150J.  Evaluation Findings: Post procedure EKG shows: NSR Complications: None Patient did tolerate procedure well.   [place], [place] 08/07/2016, 10:01 AM

## 2016-08-07 NOTE — Procedures (Signed)
Elective direct current cardioversion  Indication: Persistent atrial fibrillation  Description of procedure: After informed consent was obtained and preprocedure evaluation by Anesthesia, patient was taken to the procedure room. Timeout performed. Sedation was managed completely by the Anesthesia service, please refer to their documentation for details. Anterior and posterior chest pads were placed and connected to a biphasic defibrillator. A single synchronized shock of 150J was delivered resulting in restoration of sinus rhythm. The patient remained hemodynamically stable throughout, and there were no immediate complications noted. Follow-up ECG obtained.  Hiromi Knodel, M.D., F.A.C.C.  

## 2016-08-07 NOTE — Anesthesia Postprocedure Evaluation (Signed)
Anesthesia Post Note  Patient: Bradley Rodriguez  Procedure(s) Performed: Procedure(s) (LRB): CARDIOVERSION (N/A)  Patient location during evaluation: PACU Anesthesia Type: MAC Level of consciousness: awake and alert and oriented Pain management: pain level controlled Vital Signs Assessment: post-procedure vital signs reviewed and stable Respiratory status: spontaneous breathing Cardiovascular status: blood pressure returned to baseline and stable Postop Assessment: no signs of nausea or vomiting Anesthetic complications: no     Last Vitals:  Vitals:   08/07/16 0945 08/07/16 1016  BP: 124/73 115/81  Resp: (!) 43 16  Temp:  37 C    Last Pain:  Vitals:   08/07/16 0825  TempSrc: Oral                 Meilin Brosh

## 2016-08-07 NOTE — Anesthesia Preprocedure Evaluation (Signed)
Anesthesia Evaluation  Patient identified by MRN, date of birth, ID band Patient awake    Reviewed: Allergy & Precautions, H&P , NPO status , Patient's Chart, lab work & pertinent test results  Airway Mallampati: II  TM Distance: >3 FB     Dental  (+) Teeth Intact   Pulmonary neg pulmonary ROS,    breath sounds clear to auscultation       Cardiovascular hypertension, Pt. on medications  Rhythm:Regular Rate:Normal     Neuro/Psych PSYCHIATRIC DISORDERS Anxiety    GI/Hepatic negative GI ROS,   Endo/Other  diabetes (new onset), Type 2, Oral Hypoglycemic Agents  Renal/GU      Musculoskeletal   Abdominal   Peds  Hematology   Anesthesia Other Findings   Reproductive/Obstetrics                             Anesthesia Physical Anesthesia Plan  ASA: III  Anesthesia Plan: MAC   Post-op Pain Management:    Induction: Intravenous  PONV Risk Score and Plan:   Airway Management Planned: Simple Face Mask  Additional Equipment:   Intra-op Plan:   Post-operative Plan:   Informed Consent: I have reviewed the patients History and Physical, chart, labs and discussed the procedure including the risks, benefits and alternatives for the proposed anesthesia with the patient or authorized representative who has indicated his/her understanding and acceptance.     Plan Discussed with:   Anesthesia Plan Comments:         Anesthesia Quick Evaluation

## 2016-08-07 NOTE — Discharge Instructions (Signed)
Electrical Cardioversion, Care After °This sheet gives you information about how to care for yourself after your procedure. Your health care provider may also give you more specific instructions. If you have problems or questions, contact your health care provider. °What can I expect after the procedure? °After the procedure, it is common to have: °· Some redness on the skin where the shocks were given. ° °Follow these instructions at home: °· Do not drive for 24 hours if you were given a medicine to help you relax (sedative). °· Take over-the-counter and prescription medicines only as told by your health care provider. °· Ask your health care provider how to check your pulse. Check it often. °· Rest for 48 hours after the procedure or as told by your health care provider. °· Avoid or limit your caffeine use as told by your health care provider. °Contact a health care provider if: °· You feel like your heart is beating too quickly or your pulse is not regular. °· You have a serious muscle cramp that does not go away. °Get help right away if: °· You have discomfort in your chest. °· You are dizzy or you feel faint. °· You have trouble breathing or you are short of breath. °· Your speech is slurred. °· You have trouble moving an arm or leg on one side of your body. °· Your fingers or toes turn cold or blue. °This information is not intended to replace advice given to you by your health care provider. Make sure you discuss any questions you have with your health care provider. °Document Released: 10/29/2012 Document Revised: 08/12/2015 Document Reviewed: 07/15/2015 °Elsevier Interactive Patient Education © 2018 [employer] ° °

## 2016-08-07 NOTE — Interval H&P Note (Signed)
History and Physical Interval Note: No interval changes. Will proceed with cardioversion.  08/07/2016 9:01 AM  Bradley Rodriguez  has presented today for surgery, with the diagnosis of a-fib  The various methods of treatment have been discussed with the patient and family. After consideration of risks, benefits and other options for treatment, the patient has consented to  Procedure(s): CARDIOVERSION (N/A) as a surgical intervention .  The patient's history has been reviewed, patient examined, no change in status, stable for surgery.  I have reviewed the patient's chart and labs.  Questions were answered to the patient's satisfaction.     Kate Sable

## 2016-08-08 ENCOUNTER — Encounter (HOSPITAL_COMMUNITY): Payer: Self-pay | Admitting: Cardiovascular Disease

## 2016-10-06 ENCOUNTER — Other Ambulatory Visit: Payer: Self-pay | Admitting: Cardiovascular Disease

## 2016-11-22 ENCOUNTER — Other Ambulatory Visit: Payer: Self-pay | Admitting: Cardiovascular Disease

## 2017-01-02 DIAGNOSIS — E1142 Type 2 diabetes mellitus with diabetic polyneuropathy: Secondary | ICD-10-CM | POA: Diagnosis not present

## 2017-01-02 DIAGNOSIS — I10 Essential (primary) hypertension: Secondary | ICD-10-CM | POA: Diagnosis not present

## 2017-01-02 DIAGNOSIS — Z9189 Other specified personal risk factors, not elsewhere classified: Secondary | ICD-10-CM | POA: Diagnosis not present

## 2017-01-02 DIAGNOSIS — G4733 Obstructive sleep apnea (adult) (pediatric): Secondary | ICD-10-CM | POA: Diagnosis not present

## 2017-01-02 DIAGNOSIS — E1165 Type 2 diabetes mellitus with hyperglycemia: Secondary | ICD-10-CM | POA: Diagnosis not present

## 2017-01-02 DIAGNOSIS — E8881 Metabolic syndrome: Secondary | ICD-10-CM | POA: Diagnosis not present

## 2017-01-02 DIAGNOSIS — E782 Mixed hyperlipidemia: Secondary | ICD-10-CM | POA: Diagnosis not present

## 2017-01-02 DIAGNOSIS — F411 Generalized anxiety disorder: Secondary | ICD-10-CM | POA: Diagnosis not present

## 2017-01-08 DIAGNOSIS — Z6838 Body mass index (BMI) 38.0-38.9, adult: Secondary | ICD-10-CM | POA: Diagnosis not present

## 2017-01-08 DIAGNOSIS — I480 Paroxysmal atrial fibrillation: Secondary | ICD-10-CM | POA: Diagnosis not present

## 2017-01-08 DIAGNOSIS — M1000 Idiopathic gout, unspecified site: Secondary | ICD-10-CM | POA: Diagnosis not present

## 2017-01-08 DIAGNOSIS — Z0001 Encounter for general adult medical examination with abnormal findings: Secondary | ICD-10-CM | POA: Diagnosis not present

## 2017-01-08 DIAGNOSIS — I10 Essential (primary) hypertension: Secondary | ICD-10-CM | POA: Diagnosis not present

## 2017-01-08 DIAGNOSIS — E8881 Metabolic syndrome: Secondary | ICD-10-CM | POA: Diagnosis not present

## 2017-01-08 DIAGNOSIS — Z1212 Encounter for screening for malignant neoplasm of rectum: Secondary | ICD-10-CM | POA: Diagnosis not present

## 2017-01-08 DIAGNOSIS — E782 Mixed hyperlipidemia: Secondary | ICD-10-CM | POA: Diagnosis not present

## 2017-01-17 DIAGNOSIS — R195 Other fecal abnormalities: Secondary | ICD-10-CM | POA: Diagnosis not present

## 2017-01-17 DIAGNOSIS — K429 Umbilical hernia without obstruction or gangrene: Secondary | ICD-10-CM | POA: Diagnosis not present

## 2017-01-21 DIAGNOSIS — K625 Hemorrhage of anus and rectum: Secondary | ICD-10-CM | POA: Diagnosis not present

## 2017-01-21 DIAGNOSIS — I10 Essential (primary) hypertension: Secondary | ICD-10-CM | POA: Diagnosis not present

## 2017-01-21 DIAGNOSIS — E119 Type 2 diabetes mellitus without complications: Secondary | ICD-10-CM | POA: Diagnosis not present

## 2017-01-21 DIAGNOSIS — D124 Benign neoplasm of descending colon: Secondary | ICD-10-CM | POA: Diagnosis not present

## 2017-01-21 DIAGNOSIS — D123 Benign neoplasm of transverse colon: Secondary | ICD-10-CM | POA: Diagnosis not present

## 2017-01-21 DIAGNOSIS — Z7984 Long term (current) use of oral hypoglycemic drugs: Secondary | ICD-10-CM | POA: Diagnosis not present

## 2017-01-21 DIAGNOSIS — Z800 Family history of malignant neoplasm of digestive organs: Secondary | ICD-10-CM | POA: Diagnosis not present

## 2017-01-21 DIAGNOSIS — Z888 Allergy status to other drugs, medicaments and biological substances status: Secondary | ICD-10-CM | POA: Diagnosis not present

## 2017-01-21 DIAGNOSIS — Z79899 Other long term (current) drug therapy: Secondary | ICD-10-CM | POA: Diagnosis not present

## 2017-01-30 DIAGNOSIS — D124 Benign neoplasm of descending colon: Secondary | ICD-10-CM | POA: Diagnosis not present

## 2017-01-30 DIAGNOSIS — D123 Benign neoplasm of transverse colon: Secondary | ICD-10-CM | POA: Diagnosis not present

## 2017-05-08 DIAGNOSIS — E782 Mixed hyperlipidemia: Secondary | ICD-10-CM | POA: Diagnosis not present

## 2017-05-08 DIAGNOSIS — Z6838 Body mass index (BMI) 38.0-38.9, adult: Secondary | ICD-10-CM | POA: Diagnosis not present

## 2017-05-08 DIAGNOSIS — Z23 Encounter for immunization: Secondary | ICD-10-CM | POA: Diagnosis not present

## 2017-05-08 DIAGNOSIS — G6289 Other specified polyneuropathies: Secondary | ICD-10-CM | POA: Diagnosis not present

## 2017-05-08 DIAGNOSIS — E1142 Type 2 diabetes mellitus with diabetic polyneuropathy: Secondary | ICD-10-CM | POA: Diagnosis not present

## 2017-05-08 DIAGNOSIS — I480 Paroxysmal atrial fibrillation: Secondary | ICD-10-CM | POA: Diagnosis not present

## 2017-05-08 DIAGNOSIS — I10 Essential (primary) hypertension: Secondary | ICD-10-CM | POA: Diagnosis not present

## 2017-09-26 DIAGNOSIS — E782 Mixed hyperlipidemia: Secondary | ICD-10-CM | POA: Diagnosis not present

## 2017-09-26 DIAGNOSIS — F411 Generalized anxiety disorder: Secondary | ICD-10-CM | POA: Diagnosis not present

## 2017-09-26 DIAGNOSIS — D509 Iron deficiency anemia, unspecified: Secondary | ICD-10-CM | POA: Diagnosis not present

## 2017-09-26 DIAGNOSIS — I10 Essential (primary) hypertension: Secondary | ICD-10-CM | POA: Diagnosis not present

## 2017-09-26 DIAGNOSIS — D519 Vitamin B12 deficiency anemia, unspecified: Secondary | ICD-10-CM | POA: Diagnosis not present

## 2017-09-26 DIAGNOSIS — E1165 Type 2 diabetes mellitus with hyperglycemia: Secondary | ICD-10-CM | POA: Diagnosis not present

## 2017-09-26 DIAGNOSIS — D529 Folate deficiency anemia, unspecified: Secondary | ICD-10-CM | POA: Diagnosis not present

## 2017-09-26 DIAGNOSIS — R5383 Other fatigue: Secondary | ICD-10-CM | POA: Diagnosis not present

## 2017-09-30 DIAGNOSIS — E1142 Type 2 diabetes mellitus with diabetic polyneuropathy: Secondary | ICD-10-CM | POA: Diagnosis not present

## 2017-09-30 DIAGNOSIS — Z0001 Encounter for general adult medical examination with abnormal findings: Secondary | ICD-10-CM | POA: Diagnosis not present

## 2017-09-30 DIAGNOSIS — D500 Iron deficiency anemia secondary to blood loss (chronic): Secondary | ICD-10-CM | POA: Diagnosis not present

## 2017-09-30 DIAGNOSIS — I10 Essential (primary) hypertension: Secondary | ICD-10-CM | POA: Diagnosis not present

## 2017-09-30 DIAGNOSIS — Z1212 Encounter for screening for malignant neoplasm of rectum: Secondary | ICD-10-CM | POA: Diagnosis not present

## 2017-09-30 DIAGNOSIS — Z6838 Body mass index (BMI) 38.0-38.9, adult: Secondary | ICD-10-CM | POA: Diagnosis not present

## 2017-09-30 DIAGNOSIS — I480 Paroxysmal atrial fibrillation: Secondary | ICD-10-CM | POA: Diagnosis not present

## 2017-09-30 DIAGNOSIS — Z23 Encounter for immunization: Secondary | ICD-10-CM | POA: Diagnosis not present

## 2017-10-22 DIAGNOSIS — Z1212 Encounter for screening for malignant neoplasm of rectum: Secondary | ICD-10-CM | POA: Diagnosis not present

## 2017-10-22 DIAGNOSIS — Z1211 Encounter for screening for malignant neoplasm of colon: Secondary | ICD-10-CM | POA: Diagnosis not present

## 2017-11-04 DIAGNOSIS — D500 Iron deficiency anemia secondary to blood loss (chronic): Secondary | ICD-10-CM | POA: Diagnosis not present

## 2017-11-04 DIAGNOSIS — Z800 Family history of malignant neoplasm of digestive organs: Secondary | ICD-10-CM | POA: Diagnosis not present

## 2017-11-04 DIAGNOSIS — Z86010 Personal history of colonic polyps: Secondary | ICD-10-CM | POA: Diagnosis not present

## 2017-11-04 DIAGNOSIS — R195 Other fecal abnormalities: Secondary | ICD-10-CM | POA: Diagnosis not present

## 2017-11-08 ENCOUNTER — Other Ambulatory Visit: Payer: Self-pay | Admitting: Cardiovascular Disease

## 2017-11-08 DIAGNOSIS — Z800 Family history of malignant neoplasm of digestive organs: Secondary | ICD-10-CM | POA: Diagnosis not present

## 2017-11-08 DIAGNOSIS — I10 Essential (primary) hypertension: Secondary | ICD-10-CM | POA: Diagnosis not present

## 2017-11-08 DIAGNOSIS — D126 Benign neoplasm of colon, unspecified: Secondary | ICD-10-CM | POA: Diagnosis not present

## 2017-11-08 DIAGNOSIS — I4891 Unspecified atrial fibrillation: Secondary | ICD-10-CM | POA: Diagnosis not present

## 2017-11-08 DIAGNOSIS — D123 Benign neoplasm of transverse colon: Secondary | ICD-10-CM | POA: Diagnosis not present

## 2017-11-08 DIAGNOSIS — Z79899 Other long term (current) drug therapy: Secondary | ICD-10-CM | POA: Diagnosis not present

## 2017-11-08 DIAGNOSIS — D125 Benign neoplasm of sigmoid colon: Secondary | ICD-10-CM | POA: Diagnosis not present

## 2017-11-08 DIAGNOSIS — Z86010 Personal history of colonic polyps: Secondary | ICD-10-CM | POA: Diagnosis not present

## 2017-11-08 DIAGNOSIS — D509 Iron deficiency anemia, unspecified: Secondary | ICD-10-CM | POA: Diagnosis not present

## 2017-11-08 DIAGNOSIS — E119 Type 2 diabetes mellitus without complications: Secondary | ICD-10-CM | POA: Diagnosis not present

## 2017-11-08 DIAGNOSIS — R195 Other fecal abnormalities: Secondary | ICD-10-CM | POA: Diagnosis not present

## 2017-11-10 ENCOUNTER — Other Ambulatory Visit: Payer: Self-pay | Admitting: Cardiovascular Disease

## 2017-11-12 DIAGNOSIS — E782 Mixed hyperlipidemia: Secondary | ICD-10-CM | POA: Diagnosis not present

## 2017-11-12 DIAGNOSIS — E8881 Metabolic syndrome: Secondary | ICD-10-CM | POA: Diagnosis not present

## 2017-11-12 DIAGNOSIS — I10 Essential (primary) hypertension: Secondary | ICD-10-CM | POA: Diagnosis not present

## 2017-11-12 DIAGNOSIS — D500 Iron deficiency anemia secondary to blood loss (chronic): Secondary | ICD-10-CM | POA: Diagnosis not present

## 2017-11-12 DIAGNOSIS — E1142 Type 2 diabetes mellitus with diabetic polyneuropathy: Secondary | ICD-10-CM | POA: Diagnosis not present

## 2017-11-12 DIAGNOSIS — D649 Anemia, unspecified: Secondary | ICD-10-CM | POA: Diagnosis not present

## 2017-11-14 DIAGNOSIS — D123 Benign neoplasm of transverse colon: Secondary | ICD-10-CM | POA: Diagnosis not present

## 2017-11-14 DIAGNOSIS — D125 Benign neoplasm of sigmoid colon: Secondary | ICD-10-CM | POA: Diagnosis not present

## 2017-12-03 ENCOUNTER — Other Ambulatory Visit: Payer: Self-pay | Admitting: Cardiovascular Disease

## 2017-12-18 ENCOUNTER — Other Ambulatory Visit: Payer: Self-pay | Admitting: Cardiovascular Disease

## 2017-12-21 DIAGNOSIS — D225 Melanocytic nevi of trunk: Secondary | ICD-10-CM | POA: Diagnosis not present

## 2018-01-01 ENCOUNTER — Other Ambulatory Visit: Payer: Self-pay | Admitting: Cardiovascular Disease

## 2018-01-01 DIAGNOSIS — R739 Hyperglycemia, unspecified: Secondary | ICD-10-CM | POA: Diagnosis not present

## 2018-01-01 DIAGNOSIS — Z0001 Encounter for general adult medical examination with abnormal findings: Secondary | ICD-10-CM | POA: Diagnosis not present

## 2018-01-01 DIAGNOSIS — R5383 Other fatigue: Secondary | ICD-10-CM | POA: Diagnosis not present

## 2018-01-01 DIAGNOSIS — D649 Anemia, unspecified: Secondary | ICD-10-CM | POA: Diagnosis not present

## 2018-01-01 DIAGNOSIS — Z6837 Body mass index (BMI) 37.0-37.9, adult: Secondary | ICD-10-CM | POA: Diagnosis not present

## 2018-01-01 DIAGNOSIS — J301 Allergic rhinitis due to pollen: Secondary | ICD-10-CM | POA: Diagnosis not present

## 2018-01-01 DIAGNOSIS — M1000 Idiopathic gout, unspecified site: Secondary | ICD-10-CM | POA: Diagnosis not present

## 2018-01-01 DIAGNOSIS — I10 Essential (primary) hypertension: Secondary | ICD-10-CM | POA: Diagnosis not present

## 2018-01-01 DIAGNOSIS — E8881 Metabolic syndrome: Secondary | ICD-10-CM | POA: Diagnosis not present

## 2018-01-01 DIAGNOSIS — K580 Irritable bowel syndrome with diarrhea: Secondary | ICD-10-CM | POA: Diagnosis not present

## 2018-01-01 DIAGNOSIS — G4733 Obstructive sleep apnea (adult) (pediatric): Secondary | ICD-10-CM | POA: Diagnosis not present

## 2018-01-01 DIAGNOSIS — I480 Paroxysmal atrial fibrillation: Secondary | ICD-10-CM | POA: Diagnosis not present

## 2018-01-01 DIAGNOSIS — E1142 Type 2 diabetes mellitus with diabetic polyneuropathy: Secondary | ICD-10-CM | POA: Diagnosis not present

## 2018-01-01 DIAGNOSIS — E039 Hypothyroidism, unspecified: Secondary | ICD-10-CM | POA: Diagnosis not present

## 2018-01-01 DIAGNOSIS — E782 Mixed hyperlipidemia: Secondary | ICD-10-CM | POA: Diagnosis not present

## 2018-02-13 DIAGNOSIS — E8881 Metabolic syndrome: Secondary | ICD-10-CM | POA: Diagnosis not present

## 2018-02-13 DIAGNOSIS — D519 Vitamin B12 deficiency anemia, unspecified: Secondary | ICD-10-CM | POA: Diagnosis not present

## 2018-02-13 DIAGNOSIS — D500 Iron deficiency anemia secondary to blood loss (chronic): Secondary | ICD-10-CM | POA: Diagnosis not present

## 2018-03-19 DIAGNOSIS — Z6838 Body mass index (BMI) 38.0-38.9, adult: Secondary | ICD-10-CM | POA: Diagnosis not present

## 2018-03-19 DIAGNOSIS — E119 Type 2 diabetes mellitus without complications: Secondary | ICD-10-CM | POA: Diagnosis not present

## 2018-03-19 DIAGNOSIS — J069 Acute upper respiratory infection, unspecified: Secondary | ICD-10-CM | POA: Diagnosis not present

## 2018-03-27 DIAGNOSIS — D500 Iron deficiency anemia secondary to blood loss (chronic): Secondary | ICD-10-CM | POA: Diagnosis not present

## 2018-04-09 DIAGNOSIS — K580 Irritable bowel syndrome with diarrhea: Secondary | ICD-10-CM | POA: Diagnosis not present

## 2018-04-09 DIAGNOSIS — E782 Mixed hyperlipidemia: Secondary | ICD-10-CM | POA: Diagnosis not present

## 2018-04-09 DIAGNOSIS — I10 Essential (primary) hypertension: Secondary | ICD-10-CM | POA: Diagnosis not present

## 2018-04-09 DIAGNOSIS — D500 Iron deficiency anemia secondary to blood loss (chronic): Secondary | ICD-10-CM | POA: Diagnosis not present

## 2018-04-09 DIAGNOSIS — E1142 Type 2 diabetes mellitus with diabetic polyneuropathy: Secondary | ICD-10-CM | POA: Diagnosis not present

## 2018-04-09 DIAGNOSIS — I480 Paroxysmal atrial fibrillation: Secondary | ICD-10-CM | POA: Diagnosis not present

## 2018-04-09 DIAGNOSIS — Z23 Encounter for immunization: Secondary | ICD-10-CM | POA: Diagnosis not present

## 2018-04-09 DIAGNOSIS — Z6837 Body mass index (BMI) 37.0-37.9, adult: Secondary | ICD-10-CM | POA: Diagnosis not present

## 2018-04-21 DIAGNOSIS — I10 Essential (primary) hypertension: Secondary | ICD-10-CM | POA: Diagnosis not present

## 2018-04-21 DIAGNOSIS — E782 Mixed hyperlipidemia: Secondary | ICD-10-CM | POA: Diagnosis not present

## 2018-05-26 DIAGNOSIS — B354 Tinea corporis: Secondary | ICD-10-CM | POA: Diagnosis not present

## 2018-05-26 DIAGNOSIS — G5700 Lesion of sciatic nerve, unspecified lower limb: Secondary | ICD-10-CM | POA: Diagnosis not present

## 2018-06-21 DIAGNOSIS — E782 Mixed hyperlipidemia: Secondary | ICD-10-CM | POA: Diagnosis not present

## 2018-06-21 DIAGNOSIS — I10 Essential (primary) hypertension: Secondary | ICD-10-CM | POA: Diagnosis not present

## 2018-07-10 DIAGNOSIS — I480 Paroxysmal atrial fibrillation: Secondary | ICD-10-CM | POA: Diagnosis not present

## 2018-07-10 DIAGNOSIS — J301 Allergic rhinitis due to pollen: Secondary | ICD-10-CM | POA: Diagnosis not present

## 2018-07-10 DIAGNOSIS — K580 Irritable bowel syndrome with diarrhea: Secondary | ICD-10-CM | POA: Diagnosis not present

## 2018-07-10 DIAGNOSIS — Z0001 Encounter for general adult medical examination with abnormal findings: Secondary | ICD-10-CM | POA: Diagnosis not present

## 2018-07-10 DIAGNOSIS — M1711 Unilateral primary osteoarthritis, right knee: Secondary | ICD-10-CM | POA: Diagnosis not present

## 2018-07-10 DIAGNOSIS — Z6837 Body mass index (BMI) 37.0-37.9, adult: Secondary | ICD-10-CM | POA: Diagnosis not present

## 2018-07-10 DIAGNOSIS — E782 Mixed hyperlipidemia: Secondary | ICD-10-CM | POA: Diagnosis not present

## 2018-07-10 DIAGNOSIS — E1142 Type 2 diabetes mellitus with diabetic polyneuropathy: Secondary | ICD-10-CM | POA: Diagnosis not present

## 2018-07-10 DIAGNOSIS — G4733 Obstructive sleep apnea (adult) (pediatric): Secondary | ICD-10-CM | POA: Diagnosis not present

## 2018-07-10 DIAGNOSIS — Z23 Encounter for immunization: Secondary | ICD-10-CM | POA: Diagnosis not present

## 2018-07-10 DIAGNOSIS — E8881 Metabolic syndrome: Secondary | ICD-10-CM | POA: Diagnosis not present

## 2018-07-10 DIAGNOSIS — D500 Iron deficiency anemia secondary to blood loss (chronic): Secondary | ICD-10-CM | POA: Diagnosis not present

## 2018-07-10 DIAGNOSIS — Z1212 Encounter for screening for malignant neoplasm of rectum: Secondary | ICD-10-CM | POA: Diagnosis not present

## 2018-07-10 DIAGNOSIS — I10 Essential (primary) hypertension: Secondary | ICD-10-CM | POA: Diagnosis not present

## 2018-07-10 DIAGNOSIS — M1000 Idiopathic gout, unspecified site: Secondary | ICD-10-CM | POA: Diagnosis not present

## 2018-09-06 ENCOUNTER — Encounter (HOSPITAL_COMMUNITY): Payer: Self-pay | Admitting: Emergency Medicine

## 2018-09-06 ENCOUNTER — Emergency Department (HOSPITAL_COMMUNITY): Payer: Medicare HMO

## 2018-09-06 ENCOUNTER — Other Ambulatory Visit: Payer: Self-pay

## 2018-09-06 ENCOUNTER — Emergency Department (HOSPITAL_COMMUNITY)
Admission: EM | Admit: 2018-09-06 | Discharge: 2018-09-06 | Disposition: A | Discharge status: Home / Self Care | Payer: Medicare HMO | Attending: Emergency Medicine | Admitting: Emergency Medicine

## 2018-09-06 DIAGNOSIS — E785 Hyperlipidemia, unspecified: Secondary | ICD-10-CM | POA: Insufficient documentation

## 2018-09-06 DIAGNOSIS — I4891 Unspecified atrial fibrillation: Secondary | ICD-10-CM | POA: Diagnosis not present

## 2018-09-06 DIAGNOSIS — I10 Essential (primary) hypertension: Secondary | ICD-10-CM | POA: Insufficient documentation

## 2018-09-06 DIAGNOSIS — Z79899 Other long term (current) drug therapy: Secondary | ICD-10-CM | POA: Insufficient documentation

## 2018-09-06 LAB — CBC WITH DIFFERENTIAL/PLATELET
Abs Immature Granulocytes: 0.06 10*3/uL (ref 0.00–0.07)
Basophils Absolute: 0.1 10*3/uL (ref 0.0–0.1)
Basophils Relative: 1 %
Eosinophils Absolute: 0.2 10*3/uL (ref 0.0–0.5)
Eosinophils Relative: 2 %
HCT: 38.3 % — ABNORMAL LOW (ref 39.0–52.0)
Hemoglobin: 12.3 g/dL — ABNORMAL LOW (ref 13.0–17.0)
Immature Granulocytes: 1 %
Lymphocytes Relative: 18 %
Lymphs Abs: 1.3 10*3/uL (ref 0.7–4.0)
MCH: 29.3 pg (ref 26.0–34.0)
MCHC: 32.1 g/dL (ref 30.0–36.0)
MCV: 91.2 fL (ref 80.0–100.0)
Monocytes Absolute: 0.6 10*3/uL (ref 0.1–1.0)
Monocytes Relative: 9 %
Neutro Abs: 5 10*3/uL (ref 1.7–7.7)
Neutrophils Relative %: 69 %
Platelets: 286 10*3/uL (ref 150–400)
RBC: 4.2 MIL/uL — ABNORMAL LOW (ref 4.22–5.81)
RDW: 13.5 % (ref 11.5–15.5)
WBC: 7.2 10*3/uL (ref 4.0–10.5)
nRBC: 0 % (ref 0.0–0.2)

## 2018-09-06 LAB — COMPREHENSIVE METABOLIC PANEL
ALT: 30 U/L (ref 0–44)
AST: 25 U/L (ref 15–41)
Albumin: 4.3 g/dL (ref 3.5–5.0)
Alkaline Phosphatase: 103 U/L (ref 38–126)
Anion gap: 9 (ref 5–15)
BUN: 17 mg/dL (ref 6–20)
CO2: 27 mmol/L (ref 22–32)
Calcium: 9.2 mg/dL (ref 8.9–10.3)
Chloride: 103 mmol/L (ref 98–111)
Creatinine, Ser: 1.11 mg/dL (ref 0.61–1.24)
GFR calc Af Amer: >60 mL/min (ref >60)
GFR calc non Af Amer: >60 mL/min (ref >60)
Glucose, Bld: 164 mg/dL — ABNORMAL HIGH (ref 70–99)
Potassium: 3.9 mmol/L (ref 3.5–5.1)
Sodium: 139 mmol/L (ref 135–145)
Total Bilirubin: 1.4 mg/dL — ABNORMAL HIGH (ref 0.3–1.2)
Total Protein: 7.7 g/dL (ref 6.5–8.1)

## 2018-09-06 LAB — TROPONIN I (HIGH SENSITIVITY)
Troponin I (High Sensitivity): 6 ng/L (ref <18)
Troponin I (High Sensitivity): 7 ng/L

## 2018-09-06 MED ORDER — RIVAROXABAN 20 MG PO TABS: ORAL_TABLET | ORAL | 0 refills | Status: DC

## 2018-09-06 MED ORDER — RIVAROXABAN 20 MG PO TABS
20.0000 mg | ORAL_TABLET | Freq: Once | ORAL | Status: AC
  Administered 2018-09-06: 22:00:00 20 mg via ORAL
  Filled 2018-09-06: qty 1

## 2018-09-06 MED ORDER — DILTIAZEM HCL ER COATED BEADS 180 MG PO CP24: 180.0000 mg | ORAL_CAPSULE | Freq: Every day | ORAL | 0 refills | Status: DC

## 2018-09-06 MED ORDER — DILTIAZEM HCL ER COATED BEADS 180 MG PO CP24
180.0000 mg | ORAL_CAPSULE | Freq: Every day | ORAL | Status: DC
  Administered 2018-09-06: 19:00:00 180 mg via ORAL
  Filled 2018-09-06 (×4): qty 1

## 2018-09-06 MED ORDER — DILTIAZEM HCL 100 MG IV SOLR
5.0000 mg/h | INTRAVENOUS | Status: DC
  Filled 2018-09-06: qty 100

## 2018-09-06 MED ORDER — DILTIAZEM HCL 25 MG/5ML IV SOLN
10.0000 mg | Freq: Once | INTRAVENOUS | Status: AC
  Administered 2018-09-06: 18:00:00 10 mg via INTRAVENOUS
  Filled 2018-09-06: qty 5

## 2018-09-06 MED ORDER — RIVAROXABAN 10 MG PO TABS
ORAL_TABLET | ORAL | Status: AC
  Filled 2018-09-06: qty 2

## 2018-09-06 NOTE — ED Triage Notes (Addendum)
Pt states he went into Afib about 2 hours ago.  HR 120's. Denies pain.  Pt took 30 mg of Cardizem when it happened

## 2018-09-06 NOTE — Discharge Instructions (Addendum)
Follow-up with your cardiologist this week.  Return if problems prior

## 2018-09-06 NOTE — ED Notes (Signed)
Pt refuses Covid-19 testing.

## 2018-09-06 NOTE — ED Provider Notes (Signed)
[facility] EMERGENCY DEPARTMENT Provider Note   CSN: [id number] Arrival date & time: 09/06/18  1740     History   Chief Complaint Chief Complaint  Patient presents with  . Atrial Fibrillation    HPI Bradley Rodriguez is a 58 y.o. male.     Patient states that he noticed that his heart went to an irregular rhythm today.  Patient has a history of going into atrial fib.  He has been cardioverted 13 times.  Patient presently not taking any blood thinners.  The history is provided by the patient. No language interpreter was used.  Atrial Fibrillation This is a recurrent problem. The current episode started 6 to 12 hours ago. The problem occurs constantly. The problem has not changed since onset.Pertinent negatives include no chest pain, no abdominal pain and no headaches. Nothing relieves the symptoms. The treatment provided no relief.    Past Medical History:  Diagnosis Date  . A-fib [facility])    DCCV 2009, 2012, 2014, Nov 2016  . Anxiety   . History of kidney stones   . HX: anticoagulation    very remotely and briefly on COumadin aound one of his CV, 2014 on Eliquis had hematuria with renal calculi and stopped  . Hyperlipidemia    patient denies this  . Hypertension   . IBS (irritable bowel syndrome)   . Normal cardiac stress test    Normal nuclear stress test , 2001    Patient Active Problem List   Diagnosis Date Noted  . Ejection fraction   . Normal cardiac stress test   . HX: anticoagulation   . S/P vasectomy   . A-fib ([place])   . Hyperlipidemia   . Anxiety   . IBS (irritable bowel syndrome)     Past Surgical History:  Procedure Laterality Date  . CARDIOVERSION N/A 10/15/2012   Procedure: CARDIOVERSION;  Surgeon: Larey Dresser, MD;  Location: [facility] ENDOSCOPY;  Service: Cardiovascular;  Laterality: N/A;  . CARDIOVERSION N/A 11/26/2014   Procedure: CARDIOVERSION;  Surgeon: Arnoldo Lenis, MD;  Location: [facility] ORS;  Service: Endoscopy;  Laterality: N/A;  . CARDIOVERSION  N/A 11/15/2015   Procedure: CARDIOVERSION;  Surgeon: Jerline Pain, MD;  Location: [place];  Service: Cardiovascular;  Laterality: N/A;  . CARDIOVERSION N/A 08/07/2016   Procedure: CARDIOVERSION;  Surgeon: Herminio Commons, MD;  Location: [facility] ORS;  Service: Cardiovascular;  Laterality: N/A;  . CATARACT EXTRACTION W/PHACO Right 01/18/2014   Procedure: CATARACT EXTRACTION PHACO AND INTRAOCULAR LENS PLACEMENT ([place]);  Surgeon: Tonny Branch, MD;  Location: [facility] ORS;  Service: Ophthalmology;  Laterality: Right;  CDE 9.95  . EYE SURGERY Right    detached retina  . FOOT SURGERY Left   . KNEE ARTHROSCOPY Right   . LITHOTRIPSY     for kidney stones 2002  . TEE WITHOUT CARDIOVERSION N/A 11/26/2014   Procedure: TRANSESOPHAGEAL ECHOCARDIOGRAM (TEE) WITH PROPOFOL;  Surgeon: Arnoldo Lenis, MD;  Location: [facility] ORS;  Service: Endoscopy;  Laterality: N/A;  . TEE WITHOUT CARDIOVERSION N/A 11/15/2015   Procedure: TRANSESOPHAGEAL ECHOCARDIOGRAM (TEE);  Surgeon: Jerline Pain, MD;  Location: [facility] ENDOSCOPY;  Service: Cardiovascular;  Laterality: N/A;  . VASECTOMY          Home Medications    Prior to Admission medications   Medication Sig Start Date End Date Taking? Authorizing Provider  amLODipine (NORVASC) 5 MG tablet Take 5 mg by mouth every evening.  09/29/14  Yes [provider]  atorvastatin (LIPITOR) 20 MG tablet  Take 20 mg by mouth every evening.    Yes [provider]  calcium-vitamin D (OSCAL WITH D) 500-200 MG-UNIT tablet Take 1 tablet by mouth every morning.   Yes [provider]  diltiazem (CARDIZEM) 30 MG tablet Take 1 tablet every 4 hours AS NEEDED for afib heart rate >100 Patient taking differently: Take 30 mg by mouth See admin instructions. Take 1 tablet every 4 hours AS NEEDED for afib heart rate >100 07/27/16  Yes Sherran Needs, NP  losartan (COZAAR) 100 MG tablet Take 100 mg by mouth every evening.    Yes [provider]  metFORMIN (GLUCOPHAGE-XR)  500 MG 24 hr tablet Take 1,000 tablets by mouth 2 (two) times daily.  01/03/15  Yes [provider]  metoprolol tartrate (LOPRESSOR) 100 MG tablet TAKE 1 TABLET BY MOUTH TWICE A DAY 12/18/17  Yes Herminio Commons, MD  Turmeric 500 MG CAPS Take 1,000 mg by mouth every morning.   Yes [provider]  vitamin C (ASCORBIC ACID) 500 MG tablet Take 1,000 mg by mouth daily.   Yes [provider]  Vitamin D, Ergocalciferol, (DRISDOL) 50000 UNITS CAPS capsule Take 50,000 Units by mouth every Wednesday.  10/27/14  Yes [provider]  diltiazem (CARDIZEM CD) 180 MG 24 hr capsule Take 1 capsule (180 mg total) by mouth daily. 09/06/18   Milton Ferguson, MD  Rivaroxaban Alveda Reasons) 20 MG TABS tablet Take one pill a day 09/06/18   Milton Ferguson, MD    Family History Family History  Problem Relation Age of Onset  . Hypertension Other     Social History Social History   Tobacco Use  . Smoking status: Never Smoker  . Smokeless tobacco: Never Used  Substance Use Topics  . Alcohol use: No    Alcohol/week: 0.0 standard drinks  . Drug use: No     Allergies   Ace inhibitors, Lisinopril, and Quinine derivatives   Review of Systems Review of Systems  Constitutional: Negative for appetite change and fatigue.  HENT: Negative for congestion, ear discharge and sinus pressure.   Eyes: Negative for discharge.  Respiratory: Negative for cough.   Cardiovascular: Negative for chest pain.       Irregular heartbeat  Gastrointestinal: Negative for abdominal pain and diarrhea.  Genitourinary: Negative for frequency and hematuria.  Musculoskeletal: Negative for back pain.  Skin: Negative for rash.  Neurological: Negative for seizures and headaches.  Psychiatric/Behavioral: Negative for hallucinations.     Physical Exam Updated Vital Signs BP (!) 134/91   Pulse 73   Temp 98 F (36.7 C) (Oral)   Resp 18   Ht 6\' 8"  (2.032 m)   Wt (!) 149.7 kg   SpO2 97%   BMI  36.25 kg/m   Physical Exam Vitals signs and nursing note reviewed.  Constitutional:      Appearance: He is well-developed.  HENT:     Head: Normocephalic.     Nose: Nose normal.  Eyes:     General: No scleral icterus.    Conjunctiva/sclera: Conjunctivae normal.  Neck:     Musculoskeletal: Neck supple.     Thyroid: No thyromegaly.  Cardiovascular:     Heart sounds: No murmur. No friction rub. No gallop.      Comments: Irregular rapid rate Pulmonary:     Breath sounds: No stridor. No wheezing or rales.  Chest:     Chest wall: No tenderness.  Abdominal:     General: There is no distension.  Tenderness: There is no abdominal tenderness. There is no rebound.  Musculoskeletal: Normal range of motion.  Lymphadenopathy:     Cervical: No cervical adenopathy.  Skin:    Findings: No erythema or rash.  Neurological:     Mental Status: He is oriented to person, place, and time.     Motor: No abnormal muscle tone.     Coordination: Coordination normal.  Psychiatric:        Behavior: Behavior normal.      ED Treatments / Results  Labs (all labs ordered are listed, but only abnormal results are displayed) Labs Reviewed  CBC WITH DIFFERENTIAL/PLATELET - Abnormal; Notable for the following components:      Result Value   RBC 4.20 (*)    Hemoglobin 12.3 (*)    HCT 38.3 (*)    All other components within normal limits  COMPREHENSIVE METABOLIC PANEL - Abnormal; Notable for the following components:   Glucose, Bld 164 (*)    Total Bilirubin 1.4 (*)    All other components within normal limits  TROPONIN I (HIGH SENSITIVITY)  TROPONIN I (HIGH SENSITIVITY)    EKG None  Radiology Dg Chest Portable 1 View  Result Date: 09/06/2018 CLINICAL DATA:  Pt states he went into Afib about 2 hours ago. HR 120'sweak EXAM: PORTABLE CHEST 1 VIEW COMPARISON:  None. FINDINGS: Normal mediastinum and cardiac silhouette. Normal pulmonary vasculature. No evidence of effusion, infiltrate, or  pneumothorax. No acute bony abnormality. IMPRESSION: No acute cardiopulmonary process. Electronically Signed   By: Suzy Bouchard M.D.   On: 09/06/2018 18:35    Procedures Procedures (including critical care time)  Medications Ordered in ED Medications  diltiazem (CARDIZEM) 100 mg in dextrose 5 % 100 mL (1 mg/mL) infusion (0 mg/hr Intravenous Stopped 09/06/18 1945)  diltiazem (CARDIZEM CD) 24 hr capsule 180 mg (180 mg Oral Given 09/06/18 1858)  rivaroxaban (XARELTO) tablet 20 mg (has no administration in time range)  diltiazem (CARDIZEM) injection 10 mg (10 mg Intravenous Bolus 09/06/18 1820)     Initial Impression / Assessment and Plan / ED Course  I have reviewed the triage vital signs and the nursing notes.  Pertinent labs & imaging results that were available during my care of the patient were reviewed by me and considered in my medical decision making (see chart for details).    CRITICAL CARE Performed by: Milton Ferguson Total critical care time: 40 minutes Critical care time was exclusive of separately billable procedures and treating other patients. Critical care was necessary to treat or prevent imminent or life-threatening deterioration. Critical care was time spent personally by me on the following activities: development of treatment plan with patient and/or surrogate as well as nursing, discussions with consultants, evaluation of patient's response to treatment, examination of patient, obtaining history from patient or surrogate, ordering and performing treatments and interventions, ordering and review of laboratory studies, ordering and review of radiographic studies, pulse oximetry and re-evaluation of patient's condition.      Patient was placed on Cardizem drip.  It slowed his rate down to less than 100.  The patient did not want to be admitted for rapid atrial fib.  He was given Cardizem 180 CD and the drip was stopped.  His rate stayed under 100.  Patient is discharged  home on the Cardizem he is also given Xarelto which she was on before and he is to follow-up with cardiologist and possibly get cardioverted again.  Patient refused admission  Final Clinical Impressions(s) / ED Diagnoses  Final diagnoses:  Atrial fibrillation with RVR [facility])    ED Discharge Orders         Ordered    Rivaroxaban (XARELTO) 20 MG TABS tablet     09/06/18 2149    diltiazem (CARDIZEM CD) 180 MG 24 hr capsule  Daily     09/06/18 2149           Milton Ferguson, MD 09/06/18 2153

## 2018-09-08 ENCOUNTER — Ambulatory Visit: Payer: Medicare HMO | Admitting: Cardiology

## 2018-09-08 ENCOUNTER — Other Ambulatory Visit: Payer: Self-pay

## 2018-09-08 ENCOUNTER — Encounter: Payer: Self-pay | Admitting: Cardiology

## 2018-09-08 ENCOUNTER — Other Ambulatory Visit (HOSPITAL_COMMUNITY)
Admission: RE | Admit: 2018-09-08 | Discharge: 2018-09-08 | Disposition: A | Discharge status: Home / Self Care | Payer: Medicare HMO | Source: Ambulatory Visit | Attending: Cardiology | Admitting: Cardiology

## 2018-09-08 ENCOUNTER — Other Ambulatory Visit: Payer: Self-pay | Admitting: Cardiology

## 2018-09-08 VITALS — BP 131/75 | HR 74 | Temp 97.7°F | Ht >= 80 in | Wt 347.0 lb

## 2018-09-08 DIAGNOSIS — I480 Paroxysmal atrial fibrillation: Secondary | ICD-10-CM

## 2018-09-08 DIAGNOSIS — Z01812 Encounter for preprocedural laboratory examination: Secondary | ICD-10-CM | POA: Insufficient documentation

## 2018-09-08 DIAGNOSIS — I4891 Unspecified atrial fibrillation: Secondary | ICD-10-CM

## 2018-09-08 DIAGNOSIS — Z20828 Contact with and (suspected) exposure to other viral communicable diseases: Secondary | ICD-10-CM | POA: Diagnosis not present

## 2018-09-08 LAB — SARS CORONAVIRUS 2 (TAT 6-24 HRS): SARS Coronavirus 2: NEGATIVE

## 2018-09-08 NOTE — Patient Instructions (Addendum)
Your physician has requested that you have a TEE/Cardioversion. During a TEE, sound waves are used to create images of your heart. It provides your doctor with information about the size and shape of your heart and how well your heart's chambers and valves are working. In this test, a transducer is attached to the end of a flexible tube that is guided down you throat and into your esophagus (the tube leading from your mouth to your stomach) to get a more detailed image of your heart. Once the TEE has determined that a blood clot is not present, the cardioversion begins. Electrical Cardioversion uses a jolt of electricity to your heart either through paddles or wired patches attached to your chest. This is a controlled, usually prescheduled, procedure. This procedure is done at the hospital and you are not awake during the procedure. You usually go home the day of the procedure. Please see the instruction sheet given to you today for more information.   STOP NORVASC  Follow up 2 weeks after procedure   You will be referred to [facility], they will call you for an apt  COVID 19 testing TODAY

## 2018-09-08 NOTE — Progress Notes (Signed)
Clinical Summary [name] is a 58 y.o.male regular patient of Bradley Rodriguez, added to my schedule today for an ER f/u appointment  1. PAF - followed by Bradley Rodriguez and afib clinic - multiple previous DCCVs, last 07/2016 by Bradley Rodriguez. - from afib clinic notes has failed pill in pocket flecanide previously - from notes limited insurance has also limited options as far as antiarrhythmics or ablation - from afib clinic notes "usually required cardiversion about once per year" and also has intermittent compliance with xarelto, usually only taking around the time of his DCCV.   - seen in ER 09/06/18 with afib with RVR. Started on dilt gtt, he refused admission. Given oral dilt with rates getting to <100, was discharged. K 3.9  - ??xarelto  - on toprol 100mg  bid, dilt 180 for rate control with prn short acting dilt as needed.    - ongoing feeling of irregular beats, significant fatigue consistent with his afib in the past    Past Medical History:  Diagnosis Date  . A-fib [facility])    DCCV 2009, 2012, 2014, Nov 2016  . Anxiety   . History of kidney stones   . HX: anticoagulation    very remotely and briefly on Bradley Rodriguez aound one of his CV, 2014 on Eliquis had hematuria with renal calculi and stopped  . Hyperlipidemia    patient denies this  . Hypertension   . IBS (irritable bowel syndrome)   . Normal cardiac stress test    Normal nuclear stress test , 2001     Allergies  Allergen Reactions  . Ace Inhibitors Cough  . Lisinopril Cough  . Quinine Derivatives Rash     Current Outpatient Medications  Medication Sig Dispense Refill  . amLODipine (NORVASC) 5 MG tablet Take 5 mg by mouth every evening.   3  . atorvastatin (LIPITOR) 20 MG tablet Take 20 mg by mouth every evening.     . calcium-vitamin D (OSCAL WITH D) 500-200 MG-UNIT tablet Take 1 tablet by mouth every morning.    . diltiazem (CARDIZEM CD) 180 MG 24 hr capsule Take 1 capsule (180 mg total) by mouth daily. 30  capsule 0  . diltiazem (CARDIZEM) 30 MG tablet Take 1 tablet every 4 hours AS NEEDED for afib heart rate >100 (Patient taking differently: Take 30 mg by mouth See admin instructions. Take 1 tablet every 4 hours AS NEEDED for afib heart rate >100) 45 tablet 1  . losartan (COZAAR) 100 MG tablet Take 100 mg by mouth every evening.     . metFORMIN (GLUCOPHAGE-XR) 500 MG 24 hr tablet Take 1,000 tablets by mouth 2 (two) times daily.   3  . metoprolol tartrate (LOPRESSOR) 100 MG tablet TAKE 1 TABLET BY MOUTH TWICE A DAY 60 tablet 2  . rivaroxaban (XARELTO) 20 MG TABS tablet Take one pill a day 30 tablet 0  . Turmeric 500 MG CAPS Take 1,000 mg by mouth every morning.    . vitamin C (ASCORBIC ACID) 500 MG tablet Take 1,000 mg by mouth daily.    . Vitamin D, Ergocalciferol, (DRISDOL) 50000 UNITS CAPS capsule Take 50,000 Units by mouth every Wednesday.      No current facility-administered medications for this visit.      Past Surgical History:  Procedure Laterality Date  . CARDIOVERSION N/A 10/15/2012   Procedure: CARDIOVERSION;  Surgeon: Bradley Dresser, Bradley Rodriguez;  Location: [facility];  Service: Cardiovascular;  Laterality: N/A;  . CARDIOVERSION N/A 11/26/2014  Procedure: CARDIOVERSION;  Surgeon: Bradley Lenis, Bradley Rodriguez;  Location: [facility];  Service: Rodriguez;  Laterality: N/A;  . CARDIOVERSION N/A 11/15/2015   Procedure: CARDIOVERSION;  Surgeon: Bradley Pain, Bradley Rodriguez;  Location: [facility];  Service: Cardiovascular;  Laterality: N/A;  . CARDIOVERSION N/A 08/07/2016   Procedure: CARDIOVERSION;  Surgeon: Bradley Commons, Bradley Rodriguez;  Location: [facility];  Service: Cardiovascular;  Laterality: N/A;  . CATARACT EXTRACTION W/PHACO Right 01/18/2014   Procedure: CATARACT EXTRACTION PHACO AND INTRAOCULAR LENS PLACEMENT ([place]);  Surgeon: Bradley Annmargaret Decaprio, Bradley Rodriguez;  Location: [facility];  Service: Ophthalmology;  Laterality: Right;  CDE 9.95  . EYE SURGERY Right    detached retina  . FOOT SURGERY Left   . KNEE ARTHROSCOPY Right   .  LITHOTRIPSY     for kidney stones 2002  . TEE WITHOUT CARDIOVERSION N/A 11/26/2014   Procedure: TRANSESOPHAGEAL ECHOCARDIOGRAM (TEE) WITH PROPOFOL;  Surgeon: Bradley Lenis, Bradley Rodriguez;  Location: [facility];  Service: Rodriguez;  Laterality: N/A;  . TEE WITHOUT CARDIOVERSION N/A 11/15/2015   Procedure: TRANSESOPHAGEAL ECHOCARDIOGRAM (TEE);  Surgeon: Bradley Pain, Bradley Rodriguez;  Location: [facility];  Service: Cardiovascular;  Laterality: N/A;  . VASECTOMY       Allergies  Allergen Reactions  . Ace Inhibitors Cough  . Lisinopril Cough  . Quinine Derivatives Rash      Family History  Problem Relation Age of Onset  . Hypertension Other      Social History [name] reports that he has never smoked. He has never used smokeless tobacco. [name] reports no history of alcohol use.   Review of Systems CONSTITUTIONAL: No weight loss, fever, chills, weakness or fatigue.  HEENT: Eyes: No visual loss, blurred vision, double vision or yellow sclerae.No hearing loss, sneezing, congestion, runny nose or sore throat.  SKIN: No rash or itching.  CARDIOVASCULAR: per hpi RESPIRATORY: No shortness of breath, cough or sputum.  GASTROINTESTINAL: No anorexia, nausea, vomiting or diarrhea. No abdominal Rodriguez or blood.  GENITOURINARY: No burning on urination, no polyuria NEUROLOGICAL: No headache, dizziness, syncope, paralysis, ataxia, numbness or tingling in the extremities. No change in bowel or bladder control.  MUSCULOSKELETAL: No muscle, back Rodriguez, joint Rodriguez or stiffness.  LYMPHATICS: No enlarged nodes. No history of splenectomy.  PSYCHIATRIC: No history of depression or anxiety.  ENDOCRINOLOGIC: No reports of sweating, cold or heat intolerance. No polyuria or polydipsia.  Marland Kitchen   Physical Examination Today's Vitals   09/08/18 1353  BP: 131/75  Pulse: 74  Temp: 97.7 F (36.5 C)  Weight: (!) 347 lb (157.4 kg)  Height: 6\' 8"  (2.032 m)   Body mass index is 38.12 kg/m.  Gen: resting comfortably, no  acute distress HEENT: no scleral icterus, pupils equal round and reactive, no palptable cervical adenopathy,  CV: irreg, no mr/g, no jvd Resp: Clear to auscultation bilaterally GI: abdomen is soft, non-tender, non-distended, normal bowel sounds, no hepatosplenomegaly MSK: extremities are warm, no edema.  Skin: warm, no rash Neuro:  no focal deficits Psych: appropriate affect    Assessment and Plan  1. PAF - long history of PAF. Long term management has been limited by insurance limitations including consideration for sotalol, tiksasyn, or ablation by afib clinic. Long term pattern of afib every 1-2 years requiring cardioversion - previously failed pill in pocket flecanide - he does not tolerate even rate controlled afib.  -ekg shows today shows afib - plan for TEE/DCCV this week, needs TEE since had not been on anticoag at home - reestablish with afib  clinic, appears now his insurance has improved and consider longer term rhythm management options     F/u with afib clinic, f/u with our PA 3 weeks. Patient of Bradley Court Joy, long term f/u will need to be with him.    Bradley Rodriguez, M.D

## 2018-09-09 ENCOUNTER — Other Ambulatory Visit (HOSPITAL_COMMUNITY): Admission: RE | Admit: 2018-09-09 | Payer: Medicare HMO | Source: Ambulatory Visit

## 2018-09-09 ENCOUNTER — Other Ambulatory Visit: Payer: Self-pay

## 2018-09-09 ENCOUNTER — Encounter (HOSPITAL_COMMUNITY)
Admission: RE | Admit: 2018-09-09 | Discharge: 2018-09-09 | Disposition: A | Discharge status: Home / Self Care | Payer: Medicare HMO | Source: Ambulatory Visit | Attending: Cardiology | Admitting: Cardiology

## 2018-09-09 ENCOUNTER — Encounter (HOSPITAL_COMMUNITY): Payer: Self-pay

## 2018-09-09 DIAGNOSIS — F419 Anxiety disorder, unspecified: Secondary | ICD-10-CM | POA: Diagnosis not present

## 2018-09-09 DIAGNOSIS — K589 Irritable bowel syndrome without diarrhea: Secondary | ICD-10-CM | POA: Diagnosis not present

## 2018-09-09 DIAGNOSIS — I480 Paroxysmal atrial fibrillation: Secondary | ICD-10-CM | POA: Insufficient documentation

## 2018-09-09 DIAGNOSIS — Z7901 Long term (current) use of anticoagulants: Secondary | ICD-10-CM | POA: Insufficient documentation

## 2018-09-09 DIAGNOSIS — Z79899 Other long term (current) drug therapy: Secondary | ICD-10-CM | POA: Insufficient documentation

## 2018-09-09 DIAGNOSIS — Z7984 Long term (current) use of oral hypoglycemic drugs: Secondary | ICD-10-CM | POA: Diagnosis not present

## 2018-09-09 DIAGNOSIS — I10 Essential (primary) hypertension: Secondary | ICD-10-CM | POA: Insufficient documentation

## 2018-09-09 DIAGNOSIS — E785 Hyperlipidemia, unspecified: Secondary | ICD-10-CM | POA: Insufficient documentation

## 2018-09-09 DIAGNOSIS — Z01812 Encounter for preprocedural laboratory examination: Secondary | ICD-10-CM | POA: Insufficient documentation

## 2018-09-09 HISTORY — DX: Type 2 diabetes mellitus without complications: E11.9

## 2018-09-09 LAB — BASIC METABOLIC PANEL
Anion gap: 9 (ref 5–15)
BUN: 19 mg/dL (ref 6–20)
CO2: 22 mmol/L (ref 22–32)
Calcium: 9.1 mg/dL (ref 8.9–10.3)
Chloride: 108 mmol/L (ref 98–111)
Creatinine, Ser: 1.16 mg/dL (ref 0.61–1.24)
GFR calc Af Amer: >60 mL/min (ref >60)
GFR calc non Af Amer: >60 mL/min (ref >60)
Glucose, Bld: 139 mg/dL — ABNORMAL HIGH (ref 70–99)
Potassium: 4 mmol/L (ref 3.5–5.1)
Sodium: 139 mmol/L (ref 135–145)

## 2018-09-09 LAB — GLUCOSE, CAPILLARY: Glucose-Capillary: 140 mg/dL — ABNORMAL HIGH (ref 70–99)

## 2018-09-09 LAB — HEMOGLOBIN A1C
Hgb A1c MFr Bld: 7.2 % — ABNORMAL HIGH (ref 4.8–5.6)
Mean Plasma Glucose: 159.94 mg/dL

## 2018-09-09 NOTE — Progress Notes (Signed)
Spoke with Dr. Harl Bowie about cardiac medications.  Patient is to take Cardizem, Lopressor and Cozaar the am of surgery.

## 2018-09-09 NOTE — Patient Instructions (Addendum)
Bradley Rodriguez  09/09/2018     @PREFPERIOPPHARMACY @   Your procedure is scheduled on 09/11/2018.  Report to [facility] at 8:45 A.M.  Call this number if you have problems the morning of surgery:  [phone]   Remember:  Do not eat or drink after midnight.   Take these medicines the morning of surgery with A SIP OF WATER:  Lopressor, Caredizem and Losartan   Please Do Not Miss any doses of your  Xarelto      Do not wear jewelry, make-up or nail polish.  Do not wear lotions, powders, or perfumes, or deodorant.  Do not shave 48 hours prior to surgery.  Men may shave face and neck.  Do not bring valuables to the hospital.  [facility] is not responsible for any belongings or valuables.  Contacts, dentures or bridgework may not be worn into surgery.  Leave your suitcase in the car.  After surgery it may be brought to your room.  For patients admitted to the hospital, discharge time will be determined by your treatment team.  Patients discharged the day of surgery will not be allowed to drive home.   Name and phone number of your driver:   family Special instructions:  n/a  Please read over the following fact sheets that you were given. Care and Recovery After Surgery  Electrical Cardioversion  Electrical cardioversion is the delivery of a jolt of electricity to restore a normal rhythm to the heart. A rhythm that is too fast or is not regular keeps the heart from pumping well. In this procedure, sticky patches or metal paddles are placed on the chest to deliver electricity to the heart from a device. This procedure may be done in an emergency if:  There is low or no blood pressure as a result of the heart rhythm.  Normal rhythm must be restored as fast as possible to protect the brain and heart from further damage.  It may save a life. This procedure may also be done for irregular or fast heart rhythms that are not immediately life-threatening. Tell a health care provider  about:  Any allergies you have.  All medicines you are taking, including vitamins, herbs, eye drops, creams, and over-the-counter medicines.  Any problems you or family members have had with anesthetic medicines.  Any blood disorders you have.  Any surgeries you have had.  Any medical conditions you have.  Whether you are pregnant or may be pregnant. What are the risks? Generally, this is a safe procedure. However, problems may occur, including:  Allergic reactions to medicines.  A blood clot that breaks free and travels to other parts of your body.  The possible return of an abnormal heart rhythm within hours or days after the procedure.  Your heart stopping (cardiac arrest). This is rare. What happens before the procedure? Medicines  Your health care provider may have you start taking: ? Blood-thinning medicines (anticoagulants) so your blood does not clot as easily. ? Medicines may be given to help stabilize your heart rate and rhythm.  Ask your health care provider about changing or stopping your regular medicines. This is especially important if you are taking diabetes medicines or blood thinners. General instructions  Plan to have someone take you home from the hospital or clinic.  If you will be going home right after the procedure, plan to have someone with you for 24 hours.  Follow instructions from your health care provider about eating or drinking restrictions.  What happens during the procedure?  To lower your risk of infection: ? Your health care team will wash or sanitize their hands. ? Your skin will be washed with soap.  An IV tube will be inserted into one of your veins.  You will be given a medicine to help you relax (sedative).  Sticky patches (electrodes) or metal paddles may be placed on your chest.  An electrical shock will be delivered. The procedure may vary among health care providers and hospitals. What happens after the procedure?    Your blood pressure, heart rate, breathing rate, and blood oxygen level will be monitored until the medicines you were given have worn off.  Do not drive for 24 hours if you were given a sedative.  Your heart rhythm will be watched to make sure it does not change. This information is not intended to replace advice given to you by your health care provider. Make sure you discuss any questions you have with your health care provider. Document Released: 12/29/2001 Document Revised: 12/21/2016 Document Reviewed: 07/15/2015 Elsevier Patient Education  [place].  Transesophageal Echocardiogram  Transesophageal echocardiogram (TEE) is a test that uses sound waves (echocardiogram) to produce very clear, detailed images of the heart. TEE is done by passing a flexible tube down the esophagus. The heart is located in front of the esophagus. TEE may be done:  To check how well your heart valves are working.  To check for any abnormal growth or tumor  To look for blood clots  To check for infection of the lining of the heart (endocarditis).  To evaluate the dividing wall (septum) of the heart and check for a hole that did not close after birth (patent foramen ovale or atrial septal defect).  To help diagnose a tear in the wall of the blood vessels (aortic dissection).  To look at the heart shape, size, and function. Any changes can be associated with certain conditions, including heart failure, aneurysm, and coronary heart disease, CAD.  During cardiac valve surgery. This allows the surgeon to assess the valve repair before closing the chest.  During a variety of other cardiac procedures to guide positioning of catheters.  To monitor your heart's response to IV fluids or medicine. TEE is usually not a painful procedure. You may feel the probe press against the back of the throat. The probe does not enter the trachea and does not affect your breathing. Tell a health care provider about:   Any allergies you have.  All medicines you are taking, including vitamins, herbs, eye drops, creams, and over-the-counter medicines.  Any problems you or family members have had with anesthetic medicines.  Any blood disorders you have.  Any surgeries you have had.  Any medical conditions you have.  Any swallowing difficulties.  Whether you have or have had a blockage of the esophagus (esophageal obstruction).  Whether you are pregnant or may be pregnant. What are the risks? Generally, this is a safe procedure. However, problems may occur, including:  Damage to other structures or organs.  A tear of the esophagus (esophageal rupture).  Irregular heart beat (arrhythmia).  Hoarse voice or difficulty swallowing.  Bleeding (hemorrhage). What happens before the procedure? Staying hydrated Follow instructions from your health care provider about hydration, which may include:  Up to 3 hours before the procedure - you may continue to drink clear liquids, such as water, clear fruit juice, black coffee, and plain tea. Eating and drinking Follow instructions from your health care  provider about eating and drinking, which may include:  8 hours before the procedure - stop eating heavy meals or foods such as meat, fried foods, or fatty foods.  6 hours before the procedure - stop eating light meals or foods, such as toast or cereal.  6 hours before the procedure - stop drinking milk or drinks that contain milk.  3 hours before the procedure - stop drinking clear liquids. General instructions  You will need to remove any dentures or dental retainers.  Plan to have someone take you home from the hospital or clinic.  Plan to have a responsible adult care for you for at least 24 hours after you leave the hospital or clinic. This is important.  Ask your health care provider about: ? Changing or stopping your normal medicines. This is important if you take diabetes medicines or  blood thinners. ? Taking over-the-counter medicines, vitamins, herbs, and supplements. ? Taking medicines such as aspirin and ibuprofen. These medicines can thin your blood. Do not take these medicines unless your health care provider tells you to take them. What happens during the procedure?  To reduce your risk of infection: ? Your health care team will wash or sanitize their hands. ? Hair may be removed from the surgical area. ? Your skin will be washed with soap.  An IV will be inserted into one of your veins.  You will be given one or both of the following: ? A medicine to help you relax (sedative). ? A medicine to be sprayed or gargled in order to numb the back of your throat (local anesthetic).  Your blood pressure, heart rate, and breathing (vital signs) will be monitored during the procedure.  You may be asked to lay on your left side.  A bite block will be placed in your mouth to keep you from biting the tube during the procedure.  The tip of the TEE probe will be placed into the back of your mouth. You will be asked to swallow. This helps to pass the tip of the probe into the esophagus.  Once the tip of the probe is in the correct area, your health care provider will take pictures of the heart.  The probe and bite block will be removed when the procedure is done. The procedure may vary among health care providers and hospitals What happens after the procedure?  Your blood pressure, heart rate, breathing rate, and blood oxygen level will be monitored until the medicines you were given have worn off.  When you first wake up, your throat may feel slightly sore and will probably still feel numb. This will improve slowly over time. You will not be allowed to eat or drink until the numbness has gone away.  Do not drive for 24 hours if you received a sedative. Summary  Transesophageal echocardiogram (TEE) is a test that uses sound waves (echocardiogram) to produce very clear,  detailed images of the heart.  TEE is done by passing a flexible tube down the esophagus.  Generally, this is a safe procedure. However, problems may occur, including damage to other structures or organs, bleeding, irregular heart beat, and a hoarse voice or trouble swallowing.  Tell your health care provider about any medicines and medical conditions you may have, as some conditions may increase your risk of complications. This information is not intended to replace advice given to you by your health care provider. Make sure you discuss any questions you have with your health care provider.  Document Released: 03/31/2002 Document Revised: 06/19/2016 Document Reviewed: 04/06/2016 Elsevier Patient Education  [place].

## 2018-09-10 NOTE — Pre-Procedure Instructions (Signed)
HgbA1C routed to PCP. 

## 2018-09-11 ENCOUNTER — Encounter (HOSPITAL_COMMUNITY)
Admission: RE | Disposition: A | Discharge status: Home / Self Care | Payer: Self-pay | Source: Home / Self Care | Attending: Cardiology

## 2018-09-11 ENCOUNTER — Ambulatory Visit (HOSPITAL_COMMUNITY): Payer: Medicare HMO | Admitting: Anesthesiology

## 2018-09-11 ENCOUNTER — Ambulatory Visit (HOSPITAL_COMMUNITY)
Admission: RE | Admit: 2018-09-11 | Discharge: 2018-09-11 | Disposition: A | Discharge status: Home / Self Care | Payer: Medicare HMO | Attending: Cardiology | Admitting: Cardiology

## 2018-09-11 ENCOUNTER — Ambulatory Visit (HOSPITAL_BASED_OUTPATIENT_CLINIC_OR_DEPARTMENT_OTHER): Payer: Medicare HMO

## 2018-09-11 DIAGNOSIS — F419 Anxiety disorder, unspecified: Secondary | ICD-10-CM | POA: Insufficient documentation

## 2018-09-11 DIAGNOSIS — I4891 Unspecified atrial fibrillation: Secondary | ICD-10-CM

## 2018-09-11 DIAGNOSIS — I340 Nonrheumatic mitral (valve) insufficiency: Secondary | ICD-10-CM

## 2018-09-11 DIAGNOSIS — Z7984 Long term (current) use of oral hypoglycemic drugs: Secondary | ICD-10-CM | POA: Insufficient documentation

## 2018-09-11 DIAGNOSIS — E785 Hyperlipidemia, unspecified: Secondary | ICD-10-CM | POA: Diagnosis not present

## 2018-09-11 DIAGNOSIS — Z79899 Other long term (current) drug therapy: Secondary | ICD-10-CM | POA: Insufficient documentation

## 2018-09-11 DIAGNOSIS — Z7901 Long term (current) use of anticoagulants: Secondary | ICD-10-CM | POA: Insufficient documentation

## 2018-09-11 DIAGNOSIS — I480 Paroxysmal atrial fibrillation: Secondary | ICD-10-CM | POA: Diagnosis not present

## 2018-09-11 DIAGNOSIS — K589 Irritable bowel syndrome without diarrhea: Secondary | ICD-10-CM | POA: Insufficient documentation

## 2018-09-11 DIAGNOSIS — I10 Essential (primary) hypertension: Secondary | ICD-10-CM | POA: Diagnosis not present

## 2018-09-11 HISTORY — PX: TEE WITHOUT CARDIOVERSION: SHX5443

## 2018-09-11 HISTORY — PX: CARDIOVERSION: SHX1299

## 2018-09-11 SURGERY — CARDIOVERSION
Anesthesia: General

## 2018-09-11 MED ORDER — PROPOFOL 10 MG/ML IV BOLUS
INTRAVENOUS | Status: AC
  Filled 2018-09-11: qty 60

## 2018-09-11 MED ORDER — PROPOFOL 500 MG/50ML IV EMUL
INTRAVENOUS | Status: DC | PRN
  Administered 2018-09-11 (×2): 150 ug/kg/min via INTRAVENOUS

## 2018-09-11 MED ORDER — AMLODIPINE BESYLATE 5 MG PO TABS: 5.0000 mg | ORAL_TABLET | Freq: Every day | ORAL | 3 refills | Status: DC

## 2018-09-11 MED ORDER — KETAMINE HCL 50 MG/5ML IJ SOSY
PREFILLED_SYRINGE | INTRAMUSCULAR | Status: AC
  Filled 2018-09-11: qty 5

## 2018-09-11 MED ORDER — METFORMIN HCL ER 500 MG PO TB24: 500.0000 mg | ORAL_TABLET | Freq: Two times a day (BID) | ORAL | 3 refills | Status: DC

## 2018-09-11 MED ORDER — LACTATED RINGERS IV SOLN
INTRAVENOUS | Status: DC
  Administered 2018-09-11: 10:00:00 via INTRAVENOUS

## 2018-09-11 MED ORDER — KETAMINE HCL 10 MG/ML IJ SOLN
INTRAMUSCULAR | Status: DC | PRN
  Administered 2018-09-11: 10 mg via INTRAVENOUS
  Administered 2018-09-11: 30 mg via INTRAVENOUS

## 2018-09-11 NOTE — Transfer of Care (Signed)
Immediate Anesthesia Transfer of Care Note  Patient: Bradley Rodriguez  Procedure(s) Performed: CARDIOVERSION (N/A ) TRANSESOPHAGEAL ECHOCARDIOGRAM (TEE) WITH PROPOFOL (N/A )  Patient Location: PACU  Anesthesia Type:MAC  Level of Consciousness: awake, alert  and oriented  Airway & Oxygen Therapy: Patient Spontanous Breathing and Patient connected to face mask oxygen  Post-op Assessment: Report given to RN and Post -op Vital signs reviewed and stable  Post vital signs: Reviewed and stable  Last Vitals:  Vitals Value Taken Time  BP    Temp    Pulse    Resp    SpO2      Last Pain: There were no vitals filed for this visit.       Complications: No apparent anesthesia complications

## 2018-09-11 NOTE — H&P (Signed)
Patient with history of PAF presents for outpatient TEE/cardioversion for afib. Please see referenced clinic note below for full historyl   Bradley Dolly MD   Clinical Summary Bradley Rodriguez is a 58 y.o.male regular patient of Dr Bronson Ing, added to my schedule today for an ER f/u appointment  1. PAF - followed by Dr Bronson Ing and afib clinic - multiple previous DCCVs, last 07/2016 by Dr Bronson Ing. - from afib clinic notes has failed pill in pocket flecanide previously - from notes limited insurance has also limited options as far as antiarrhythmics or ablation - from afib clinic notes "usually required cardiversion about once per year" and also has intermittent compliance with xarelto, usually only taking around the time of his DCCV.   - seen in ER 09/06/18 with afib with RVR. Started on dilt gtt, he refused admission. Given oral dilt with rates getting to <100, was discharged. K 3.9  - ??xarelto  - on toprol 100mg  bid, dilt 180 for rate control with prn short acting dilt as needed.    - ongoing feeling of irregular beats, significant fatigue consistent with his afib in the past        Past Medical History:  Diagnosis Date  . A-fib [facility])    DCCV 2009, 2012, 2014, Nov 2016  . Anxiety   . History of kidney stones   . HX: anticoagulation    very remotely and briefly on COumadin aound one of his CV, 2014 on Eliquis had hematuria with renal calculi and stopped  . Hyperlipidemia    patient denies this  . Hypertension   . IBS (irritable bowel syndrome)   . Normal cardiac stress test    Normal nuclear stress test , 2001         Allergies  Allergen Reactions  . Ace Inhibitors Cough  . Lisinopril Cough  . Quinine Derivatives Rash           Current Outpatient Medications  Medication Sig Dispense Refill  . amLODipine (NORVASC) 5 MG tablet Take 5 mg by mouth every evening.   3  . atorvastatin (LIPITOR) 20 MG tablet Take 20 mg by mouth every evening.      . calcium-vitamin D (OSCAL WITH D) 500-200 MG-UNIT tablet Take 1 tablet by mouth every morning.    . diltiazem (CARDIZEM CD) 180 MG 24 hr capsule Take 1 capsule (180 mg total) by mouth daily. 30 capsule 0  . diltiazem (CARDIZEM) 30 MG tablet Take 1 tablet every 4 hours AS NEEDED for afib heart rate >100 (Patient taking differently: Take 30 mg by mouth See admin instructions. Take 1 tablet every 4 hours AS NEEDED for afib heart rate >100) 45 tablet 1  . losartan (COZAAR) 100 MG tablet Take 100 mg by mouth every evening.     . metFORMIN (GLUCOPHAGE-XR) 500 MG 24 hr tablet Take 1,000 tablets by mouth 2 (two) times daily.   3  . metoprolol tartrate (LOPRESSOR) 100 MG tablet TAKE 1 TABLET BY MOUTH TWICE A DAY 60 tablet 2  . rivaroxaban (XARELTO) 20 MG TABS tablet Take one pill a day 30 tablet 0  . Turmeric 500 MG CAPS Take 1,000 mg by mouth every morning.    . vitamin C (ASCORBIC ACID) 500 MG tablet Take 1,000 mg by mouth daily.    . Vitamin D, Ergocalciferol, (DRISDOL) 50000 UNITS CAPS capsule Take 50,000 Units by mouth every Wednesday.      No current facility-administered medications for this visit.  Past Surgical History:  Procedure Laterality Date  . CARDIOVERSION N/A 10/15/2012   Procedure: CARDIOVERSION;  Surgeon: Larey Dresser, MD;  Location[facility] ENDOSCOPY;  Service: Cardiovascular;  Laterality: N/A;  . CARDIOVERSION N/A 11/26/2014   Procedure: CARDIOVERSION;  Surgeon: Arnoldo Lenis, MD;  Location: [facility] ORS;  Service: Endoscopy;  Laterality: N/A;  . CARDIOVERSION N/A 11/15/2015   Procedure: CARDIOVERSION;  Surgeon: Jerline Pain, MD;  Location: [place];  Service: Cardiovascular;  Laterality: N/A;  . CARDIOVERSION N/A 08/07/2016   Procedure: CARDIOVERSION;  Surgeon: Herminio Commons, MD;  Location: [facility] ORS;  Service: Cardiovascular;  Laterality: N/A;  . CATARACT EXTRACTION W/PHACO Right 01/18/2014   Procedure: CATARACT EXTRACTION PHACO AND  INTRAOCULAR LENS PLACEMENT ([place]);  Surgeon: Tonny Ensley Blas, MD;  Location: [facility] ORS;  Service: Ophthalmology;  Laterality: Right;  CDE 9.95  . EYE SURGERY Right    detached retina  . FOOT SURGERY Left   . KNEE ARTHROSCOPY Right   . LITHOTRIPSY     for kidney stones 2002  . TEE WITHOUT CARDIOVERSION N/A 11/26/2014   Procedure: TRANSESOPHAGEAL ECHOCARDIOGRAM (TEE) WITH PROPOFOL;  Surgeon: Arnoldo Lenis, MD;  Location: [facility];  Service: Endoscopy;  Laterality: N/A;  . TEE WITHOUT CARDIOVERSION N/A 11/15/2015   Procedure: TRANSESOPHAGEAL ECHOCARDIOGRAM (TEE);  Surgeon: Jerline Pain, MD;  Location: [facility] ENDOSCOPY;  Service: Cardiovascular;  Laterality: N/A;  . VASECTOMY           Allergies  Allergen Reactions  . Ace Inhibitors Cough  . Lisinopril Cough  . Quinine Derivatives Rash           Family History  Problem Relation Age of Onset  . Hypertension Other      Social History Mr. Bartnik reports that he has never smoked. He has never used smokeless tobacco. Mr. Verhagen reports no history of alcohol use.   Review of Systems CONSTITUTIONAL: No weight loss, fever, chills, weakness or fatigue.  HEENT: Eyes: No visual loss, blurred vision, double vision or yellow sclerae.No hearing loss, sneezing, congestion, runny nose or sore throat.  SKIN: No rash or itching.  CARDIOVASCULAR: per hpi RESPIRATORY: No shortness of breath, cough or sputum.  GASTROINTESTINAL: No anorexia, nausea, vomiting or diarrhea. No abdominal pain or blood.  GENITOURINARY: No burning on urination, no polyuria NEUROLOGICAL: No headache, dizziness, syncope, paralysis, ataxia, numbness or tingling in the extremities. No change in bowel or bladder control.  MUSCULOSKELETAL: No muscle, back pain, joint pain or stiffness.  LYMPHATICS: No enlarged nodes. No history of splenectomy.  PSYCHIATRIC: No history of depression or anxiety.  ENDOCRINOLOGIC: No reports of sweating, cold or heat intolerance. No  polyuria or polydipsia.  Bradley Rodriguez   Physical Examination    Today's Vitals   09/08/18 1353  BP: 131/75  Pulse: 74  Temp: 97.7 F (36.5 C)  Weight: (!) 347 lb (157.4 kg)  Height: 6\' 8"  (2.032 m)   Body mass index is 38.12 kg/m.  Gen: resting comfortably, no acute distress HEENT: no scleral icterus, pupils equal round and reactive, no palptable cervical adenopathy,  CV: irreg, no mr/g, no jvd Resp: Clear to auscultation bilaterally GI: abdomen is soft, non-tender, non-distended, normal bowel sounds, no hepatosplenomegaly MSK: extremities are warm, no edema.  Skin: warm, no rash Neuro:  no focal deficits Psych: appropriate affect    Assessment and Plan  1. PAF - long history of PAF. Long term management has been limited by insurance limitations including consideration for sotalol, tiksasyn, or ablation by afib clinic. Long  term pattern of afib every 1-2 years requiring cardioversion - previously failed pill in pocket flecanide - he does not tolerate even rate controlled afib.  -ekg shows today shows afib - plan for TEE/DCCV this week, needs TEE since had not been on anticoag at home - reestablish with afib clinic, appears now his insurance has improved and consider longer term rhythm management options     F/u with afib clinic, f/u with our PA 3 weeks. Patient of Dr Court Joy, long term f/u will need to be with him.    Arnoldo Lenis, M.D

## 2018-09-11 NOTE — Progress Notes (Signed)
*  PRELIMINARY RESULTS* Echocardiogram Echocardiogram Transesophageal has been performed.  Bradley Rodriguez 09/11/2018, 11:02 AM

## 2018-09-11 NOTE — Anesthesia Procedure Notes (Addendum)
Date/Time: 09/11/2018 10:00 AM Performed by: Jonna Munro, CRNA Pre-anesthesia Checklist: Timeout performed, Patient being monitored, Suction available, Emergency Drugs available and Patient identified Patient Re-evaluated:Patient Re-evaluated prior to induction Oxygen Delivery Method: Nasal cannula

## 2018-09-11 NOTE — Anesthesia Preprocedure Evaluation (Signed)
Anesthesia Evaluation  Patient identified by MRN, date of birth, ID band Patient awake    Reviewed: Allergy & Precautions, NPO status , Patient's Chart, lab work & pertinent test results, reviewed documented beta blocker date and time   Airway Mallampati: II  TM Distance: >3 FB Neck ROM: Full    Dental no notable dental hx. (+) Teeth Intact, Missing Missing front two teeth :   Pulmonary neg pulmonary ROS,  Denies OSA -but was only tested once ~30 years ago  States has gained ~40 lbs since that time    Pulmonary exam normal breath sounds clear to auscultation       Cardiovascular Exercise Tolerance: Good hypertension, Pt. on medications and Pt. on home beta blockers Normal cardiovascular exam+ dysrhythmias Atrial Fibrillation I Rhythm:Regular Rate:Normal  Denies Recent CP States Afib started ~5 days ago  Has had 12 DCCV Last DCCV 07/2016 States only takes the Xarelto when he knows he's in Afib     Neuro/Psych Anxiety negative neurological ROS  negative psych ROS   GI/Hepatic negative GI ROS, Neg liver ROS,   Endo/Other  negative endocrine ROSdiabetes, Type 2  Renal/GU negative Renal ROS  negative genitourinary   Musculoskeletal negative musculoskeletal ROS (+)   Abdominal   Peds negative pediatric ROS (+)  Hematology negative hematology ROS (+)   Anesthesia Other Findings   Reproductive/Obstetrics negative OB ROS                             Anesthesia Physical Anesthesia Plan  ASA: III  Anesthesia Plan: General   Post-op Pain Management:    Induction: Intravenous  PONV Risk Score and Plan: 2 and Propofol infusion, TIVA and Treatment may vary due to age or medical condition  Airway Management Planned: Simple Face Mask and Nasal Cannula  Additional Equipment:   Intra-op Plan:   Post-operative Plan:   Informed Consent: I have reviewed the patients History and Physical,  chart, labs and discussed the procedure including the risks, benefits and alternatives for the proposed anesthesia with the patient or authorized representative who has indicated his/her understanding and acceptance.     Dental advisory given  Plan Discussed with: CRNA  Anesthesia Plan Comments: (Plan Full PPE use  Plan GA as tolerated -D/w GETA as needed -voiced understanding WTP [place] )        Anesthesia Quick Evaluation

## 2018-09-11 NOTE — Anesthesia Postprocedure Evaluation (Signed)
Anesthesia Post Note  Patient: Bradley Rodriguez  Procedure(s) Performed: CARDIOVERSION (N/A ) TRANSESOPHAGEAL ECHOCARDIOGRAM (TEE) WITH PROPOFOL (N/A )  Patient location during evaluation: PACU Anesthesia Type: MAC Level of consciousness: awake and alert, patient cooperative and oriented Pain management: pain level controlled Vital Signs Assessment: post-procedure vital signs reviewed and stable Respiratory status: spontaneous breathing, patient connected to face mask oxygen, nonlabored ventilation and respiratory function stable Cardiovascular status: stable Postop Assessment: no apparent nausea or vomiting Anesthetic complications: no     Last Vitals: There were no vitals filed for this visit.  Last Pain: There were no vitals filed for this visit.               Naeem Quillin

## 2018-09-11 NOTE — CV Procedure (Signed)
Procedure: Transesophageal echocardiogram Physician: Dr [name] MD Indication: Persistent atrial fibrilation  Patient was brought to the procedure suite after appropriate consent was obtained. Sedation was achieved with the assistance of anesthesiology, please see there note for details. He was placed in the left lateral decubitus position and bite block was placed. The TEE probe was intubated into the esophagus without difficulty and severe images obtained. Please see full TEE report for findings, overall no evidence of LA thrombus, normal LA appendage.  We then proceeded to electrical cardioversion. Defib pads were placed in the anterior and posterior postions, he was succesfully converted with a single synchronized 200j shock to sinus rhythm.   Cardiopulmonary monitoring was performed throughout the procedure, he tolerated well without complication.   We will stop his dilitiazem 180, restart his norvac 5mg  daily.   [name] MD

## 2018-09-11 NOTE — Progress Notes (Signed)
Electrical Cardioversion Procedure Note Bradley Rodriguez [id number] 10-06-1960  Procedure: Electrical Cardioversion Indications:  Atrial Fibrillation  Procedure Details Consent: Risks of procedure as well as the alternatives and risks of each were explained to the (patient/caregiver).  Consent for procedure obtained. Time Out: Verified patient identification, verified procedure, site/side was marked, verified correct patient position, special equipment/implants available, medications/allergies/relevent history reviewed, required imaging and test results available.  Performed at [phone]  Patient placed on cardiac monitor, pulse oximetry, supplemental oxygen as necessary.  Sedation given: See Anesthesia record Pacer pads placed anterior and posterior chest.  Cardioverted 1 time(s).  Cardioverted at 200J @ 1013.  Evaluation Findings: Post procedure EKG shows: NSR Complications: None Patient did tolerate procedure well.   Lewis, Leon 09/11/2018, 10:24 AM

## 2018-09-11 NOTE — Discharge Instructions (Signed)
Electrical Cardioversion, Care After This sheet gives you information about how to care for yourself after your procedure. Your health care provider may also give you more specific instructions. If you have problems or questions, contact your health care provider. What can I expect after the procedure? After the procedure, it is common to have:  Some redness on the skin where the shocks were given. Follow these instructions at home:   Do not drive for 24 hours if you were given a medicine to help you relax (sedative).  Take over-the-counter and prescription medicines only as told by your health care provider.  Ask your health care provider how to check your pulse. Check it often.  Rest for 48 hours after the procedure or as told by your health care provider.  Avoid or limit your caffeine use as told by your health care provider. Contact a health care provider if:  You feel like your heart is beating too quickly or your pulse is not regular.  You have a serious muscle cramp that does not go away. Get help right away if:   You have discomfort in your chest.  You are dizzy or you feel faint.  You have trouble breathing or you are short of breath.  Your speech is slurred.  You have trouble moving an arm or leg on one side of your body.  Your fingers or toes turn cold or blue. This information is not intended to replace advice given to you by your health care provider. Make sure you discuss any questions you have with your health care provider. Document Released: 10/29/2012 Document Revised: 12/21/2016 Document Reviewed: 07/15/2015 Elsevier Patient Education  2020 [place] After These instructions provide you with information about caring for yourself after your procedure. Your health care provider may also give you more specific instructions. Your treatment has been planned according to current medical practices, but problems  sometimes occur. Call your health care provider if you have any problems or questions after your procedure. What can I expect after the procedure? After your procedure, you may:  Feel sleepy for several hours.  Feel clumsy and have poor balance for several hours.  Feel forgetful about what happened after the procedure.  Have poor judgment for several hours.  Feel nauseous or vomit.  Have a sore throat if you had a breathing tube during the procedure. Follow these instructions at home: For at least 24 hours after the procedure:      Have a responsible adult stay with you. It is important to have someone help care for you until you are awake and alert.  Rest as needed.  Do not: ? Participate in activities in which you could fall or become injured. ? Drive. ? Use heavy machinery. ? Drink alcohol. ? Take sleeping pills or medicines that cause drowsiness. ? Make important decisions or sign legal documents. ? Take care of children on your own. Eating and drinking  Follow the diet that is recommended by your health care provider.  If you vomit, drink water, juice, or soup when you can drink without vomiting.  Make sure you have little or no nausea before eating solid foods. General instructions  Take over-the-counter and prescription medicines only as told by your health care provider.  If you have sleep apnea, surgery and certain medicines can increase your risk for breathing problems. Follow instructions from your health care provider about wearing your sleep device: ? Anytime you are  sleeping, including during daytime naps. ? While taking prescription pain medicines, sleeping medicines, or medicines that make you drowsy.  If you smoke, do not smoke without supervision.  Keep all follow-up visits as told by your health care provider. This is important. Contact a health care provider if:  You keep feeling nauseous or you keep vomiting.  You feel light-headed.  You  develop a rash.  You have a fever. Get help right away if:  You have trouble breathing. Summary  For several hours after your procedure, you may feel sleepy and have poor judgment.  Have a responsible adult stay with you for at least 24 hours or until you are awake and alert. This information is not intended to replace advice given to you by your health care provider. Make sure you discuss any questions you have with your health care provider. Document Released: 05/01/2015 Document Revised: 04/08/2017 Document Reviewed: 05/01/2015 Elsevier Patient Education  2020 [employer].

## 2018-09-12 ENCOUNTER — Encounter (HOSPITAL_COMMUNITY): Payer: Self-pay | Admitting: Cardiology

## 2018-09-22 ENCOUNTER — Encounter (HOSPITAL_COMMUNITY): Payer: Self-pay

## 2018-09-30 ENCOUNTER — Other Ambulatory Visit: Payer: Self-pay

## 2018-09-30 ENCOUNTER — Encounter: Payer: Self-pay | Admitting: Cardiology

## 2018-09-30 ENCOUNTER — Ambulatory Visit (INDEPENDENT_AMBULATORY_CARE_PROVIDER_SITE_OTHER): Payer: Medicare HMO | Admitting: Cardiology

## 2018-09-30 VITALS — BP 151/71 | HR 82 | Temp 98.0°F | Ht >= 80 in | Wt 347.0 lb

## 2018-09-30 DIAGNOSIS — I10 Essential (primary) hypertension: Secondary | ICD-10-CM | POA: Diagnosis not present

## 2018-09-30 DIAGNOSIS — I480 Paroxysmal atrial fibrillation: Secondary | ICD-10-CM | POA: Diagnosis not present

## 2018-09-30 MED ORDER — RIVAROXABAN 20 MG PO TABS: ORAL_TABLET | ORAL | 7 refills | Status: DC

## 2018-09-30 NOTE — Patient Instructions (Signed)

## 2018-09-30 NOTE — Progress Notes (Signed)
Clinical Summary Bradley Rodriguez is a 58 y.o.male seen today for follow up of the following medical problems. Regular patient of Dr Bronson Ing, I had seen as an add on for recurrent afib and now is s/p cardioversion.   1. PAF - followed by Dr Bronson Ing and afib clinic - multiple previous DCCVs, last 07/2016 by Dr Bronson Ing. - from afib clinic notes has failed pill in pocket flecanide previously - from notes limited insurance has also limited options as far as antiarrhythmics or ablation - from afib clinic notes "usually required cardiversion about once per year" and also has intermittent compliance with xarelto, usually only taking around the time of his DCCV.   - seen in ER 09/06/18 with afib with RVR. Started on dilt gtt, he refused admission. Given oral dilt with rates getting to <100, was discharged. K 3.9    - s/p DCCV 09/11/18. After cardioversion the diltiazem that was recently started was stopped and we restarted his prior norvasc 5mg  daily - no recent palpitations. Compliant with meds - he has contact info for afib clinic, he is deciding when he wants to follow up  2. HTN - homes bp's 130s/70s  Past Medical History:  Diagnosis Date  . A-fib [facility])    DCCV 2009, 2012, 2014, Nov 2016  . Anxiety   . Diabetes mellitus without complication ([place])   . History of kidney stones   . HX: anticoagulation    very remotely and briefly on COumadin aound one of his CV, 2014 on Eliquis had hematuria with renal calculi and stopped  . Hyperlipidemia    patient denies this  . Hypertension   . IBS (irritable bowel syndrome)   . Normal cardiac stress test    Normal nuclear stress test , 2001     Allergies  Allergen Reactions  . Ace Inhibitors Cough  . Lisinopril Cough  . Quinine Derivatives Rash     Current Outpatient Medications  Medication Sig Dispense Refill  . amLODipine (NORVASC) 5 MG tablet Take 1 tablet (5 mg total) by mouth daily. 90 tablet 3  . atorvastatin (LIPITOR) 20  MG tablet Take 20 mg by mouth every evening.     . calcium-vitamin D (OSCAL WITH D) 500-200 MG-UNIT tablet Take 1 tablet by mouth every morning.    Marland Kitchen losartan (COZAAR) 100 MG tablet Take 100 mg by mouth every evening.     . metFORMIN (GLUCOPHAGE-XR) 500 MG 24 hr tablet Take 1 tablet (500 mg total) by mouth 2 (two) times daily.  3  . metoprolol tartrate (LOPRESSOR) 100 MG tablet TAKE 1 TABLET BY MOUTH TWICE A DAY 60 tablet 2  . rivaroxaban (XARELTO) 20 MG TABS tablet Take one pill a day 30 tablet 0  . Turmeric 500 MG CAPS Take 1,000 mg by mouth every morning.    . vitamin C (ASCORBIC ACID) 500 MG tablet Take 1,000 mg by mouth daily.    . Vitamin D, Ergocalciferol, (DRISDOL) 50000 UNITS CAPS capsule Take 50,000 Units by mouth every Wednesday.      No current facility-administered medications for this visit.      Past Surgical History:  Procedure Laterality Date  . CARDIOVERSION N/A 10/15/2012   Procedure: CARDIOVERSION;  Surgeon: Larey Dresser, MD;  Location[facility] ENDOSCOPY;  Service: Cardiovascular;  Laterality: N/A;  . CARDIOVERSION N/A 11/26/2014   Procedure: CARDIOVERSION;  Surgeon: Arnoldo Lenis, MD;  Location: [facility] ORS;  Service: Endoscopy;  Laterality: N/A;  . CARDIOVERSION N/A 11/15/2015  Procedure: CARDIOVERSION;  Surgeon: Jerline Pain, MD;  Location: [facility] ENDOSCOPY;  Service: Cardiovascular;  Laterality: N/A;  . CARDIOVERSION N/A 08/07/2016   Procedure: CARDIOVERSION;  Surgeon: Herminio Commons, MD;  Location: [facility] ORS;  Service: Cardiovascular;  Laterality: N/A;  . CARDIOVERSION N/A 09/11/2018   Procedure: CARDIOVERSION;  Surgeon: Arnoldo Lenis, MD;  Location: [facility]S;  Service: Endoscopy;  Laterality: N/A;  . CATARACT EXTRACTION W/PHACO Right 01/18/2014   Procedure: CATARACT EXTRACTION PHACO AND INTRAOCULAR LENS PLACEMENT (IOC);  Surgeon: Tonny Branch, MD;  Location: [facility];  Service: Ophthalmology;  Laterality: Right;  CDE 9.95  . EYE SURGERY Right    detached retina  .  FOOT SURGERY Left   . KNEE ARTHROSCOPY Right   . LITHOTRIPSY     for kidney stones 2002  . TEE WITHOUT CARDIOVERSION N/A 11/26/2014   Procedure: TRANSESOPHAGEAL ECHOCARDIOGRAM (TEE) WITH PROPOFOL;  Surgeon: Arnoldo Lenis, MD;  Location: [facility];  Service: Endoscopy;  Laterality: N/A;  . TEE WITHOUT CARDIOVERSION N/A 11/15/2015   Procedure: TRANSESOPHAGEAL ECHOCARDIOGRAM (TEE);  Surgeon: Jerline Pain, MD;  Location: [facility];  Service: Cardiovascular;  Laterality: N/A;  . TEE WITHOUT CARDIOVERSION N/A 09/11/2018   Procedure: TRANSESOPHAGEAL ECHOCARDIOGRAM (TEE) WITH PROPOFOL;  Surgeon: Arnoldo Lenis, MD;  Location: [facility];  Service: Endoscopy;  Laterality: N/A;  . VASECTOMY       Allergies  Allergen Reactions  . Ace Inhibitors Cough  . Lisinopril Cough  . Quinine Derivatives Rash      Family History  Problem Relation Age of Onset  . Hypertension Other      Social History Bradley Rodriguez reports that he has never smoked. He has never used smokeless tobacco. Bradley Rodriguez reports no history of alcohol use.   Review of Systems CONSTITUTIONAL: No weight loss, fever, chills, weakness or fatigue.  HEENT: Eyes: No visual loss, blurred vision, double vision or yellow sclerae.No hearing loss, sneezing, congestion, runny nose or sore throat.  SKIN: No rash or itching.  CARDIOVASCULAR: per hpi RESPIRATORY: No shortness of breath, cough or sputum.  GASTROINTESTINAL: No anorexia, nausea, vomiting or diarrhea. No abdominal pain or blood.  GENITOURINARY: No burning on urination, no polyuria NEUROLOGICAL: No headache, dizziness, syncope, paralysis, ataxia, numbness or tingling in the extremities. No change in bowel or bladder control.  MUSCULOSKELETAL: No muscle, back pain, joint pain or stiffness.  LYMPHATICS: No enlarged nodes. No history of splenectomy.  PSYCHIATRIC: No history of depression or anxiety.  ENDOCRINOLOGIC: No reports of sweating, cold or heat intolerance. No polyuria or  polydipsia.  Marland Kitchen   Physical Examination Today's Vitals   09/30/18 1356  BP: (!) 151/71  Pulse: 82  Temp: 98 F (36.7 C)  SpO2: 95%  Weight: (!) 347 lb (157.4 kg)  Height: 6\' 8"  (2.032 m)   Body mass index is 38.12 kg/m.  Gen: resting comfortably, no acute distress HEENT: no scleral icterus, pupils equal round and reactive, no palptable cervical adenopathy,  CV: RRR, no mr/g, no jvd Resp: Clear to auscultation bilaterally GI: abdomen is soft, non-tender, non-distended, normal bowel sounds, no hepatosplenomegaly MSK: extremities are warm, no edema.  Skin: warm, no rash Neuro:  no focal deficits Psych: appropriate affect   Diagnostic Studies     Assessment and Plan   1. PAF - long history of PAF. Long term management has been limited by insurance limitations including consideration for sotalol, tiksasyn, or ablation by afib clinic. Long term pattern of afib every 1-2 years requiring cardioversion -  previously failed pill in pocket flecanide - he does not tolerate even rate controlled afib.   - s/p repeat DCCV a few weeks ago, ekg today shows still in SR, he is felling good - continue xarelto, he is not sure he wants to take it beyond 1 month after cardioversion. We discussed his CHADS2Vasc is 2 (HTN,DM2) and that it is recommended, he will consider at this time. - he will contact afib clinic when he is ready to f/u, asked to schedule f/u to help set long term afib plan. He has more options than before now that his insurance is in place.   2. HTN - elevated in clinic, home numbers at goal. Continue current meds, if neccesary can increase norvasc in the future.   F/u with afib clinic, he will call them back to arrange appt. Patient of Dr Court Joy, long term f/u will need to be with him in 6 months     Arnoldo Lenis, M.D.

## 2018-10-09 DIAGNOSIS — Z23 Encounter for immunization: Secondary | ICD-10-CM | POA: Diagnosis not present

## 2018-10-09 DIAGNOSIS — D500 Iron deficiency anemia secondary to blood loss (chronic): Secondary | ICD-10-CM | POA: Diagnosis not present

## 2018-10-09 DIAGNOSIS — Z6841 Body Mass Index (BMI) 40.0 and over, adult: Secondary | ICD-10-CM | POA: Diagnosis not present

## 2018-10-09 DIAGNOSIS — E1142 Type 2 diabetes mellitus with diabetic polyneuropathy: Secondary | ICD-10-CM | POA: Diagnosis not present

## 2018-10-09 DIAGNOSIS — Z0001 Encounter for general adult medical examination with abnormal findings: Secondary | ICD-10-CM | POA: Diagnosis not present

## 2018-10-09 DIAGNOSIS — I480 Paroxysmal atrial fibrillation: Secondary | ICD-10-CM | POA: Diagnosis not present

## 2018-10-09 DIAGNOSIS — M1000 Idiopathic gout, unspecified site: Secondary | ICD-10-CM | POA: Diagnosis not present

## 2018-10-09 DIAGNOSIS — I10 Essential (primary) hypertension: Secondary | ICD-10-CM | POA: Diagnosis not present

## 2018-10-17 ENCOUNTER — Telehealth: Payer: Self-pay | Admitting: Cardiology

## 2018-10-17 MED ORDER — RIVAROXABAN 20 MG PO TABS: ORAL_TABLET | ORAL | 7 refills | Status: DC

## 2018-10-17 NOTE — Telephone Encounter (Signed)
Needing rivaroxaban (XARELTO) 20 MG TABS tablet [id number]  Sent to [facility]

## 2018-10-17 NOTE — Telephone Encounter (Signed)
DONE

## 2018-11-04 DIAGNOSIS — M79675 Pain in left toe(s): Secondary | ICD-10-CM | POA: Diagnosis not present

## 2018-11-04 DIAGNOSIS — L03032 Cellulitis of left toe: Secondary | ICD-10-CM | POA: Diagnosis not present

## 2018-11-18 DIAGNOSIS — L03032 Cellulitis of left toe: Secondary | ICD-10-CM | POA: Diagnosis not present

## 2018-11-18 DIAGNOSIS — M79675 Pain in left toe(s): Secondary | ICD-10-CM | POA: Diagnosis not present

## 2019-01-01 DIAGNOSIS — Z0001 Encounter for general adult medical examination with abnormal findings: Secondary | ICD-10-CM | POA: Diagnosis not present

## 2019-01-05 DIAGNOSIS — M1000 Idiopathic gout, unspecified site: Secondary | ICD-10-CM | POA: Diagnosis not present

## 2019-01-05 DIAGNOSIS — I480 Paroxysmal atrial fibrillation: Secondary | ICD-10-CM | POA: Diagnosis not present

## 2019-01-05 DIAGNOSIS — I10 Essential (primary) hypertension: Secondary | ICD-10-CM | POA: Diagnosis not present

## 2019-01-05 DIAGNOSIS — Z6837 Body mass index (BMI) 37.0-37.9, adult: Secondary | ICD-10-CM | POA: Diagnosis not present

## 2019-01-05 DIAGNOSIS — D500 Iron deficiency anemia secondary to blood loss (chronic): Secondary | ICD-10-CM | POA: Diagnosis not present

## 2019-01-05 DIAGNOSIS — Z1212 Encounter for screening for malignant neoplasm of rectum: Secondary | ICD-10-CM | POA: Diagnosis not present

## 2019-01-05 DIAGNOSIS — K580 Irritable bowel syndrome with diarrhea: Secondary | ICD-10-CM | POA: Diagnosis not present

## 2019-01-05 DIAGNOSIS — E1142 Type 2 diabetes mellitus with diabetic polyneuropathy: Secondary | ICD-10-CM | POA: Diagnosis not present

## 2019-01-22 DIAGNOSIS — I10 Essential (primary) hypertension: Secondary | ICD-10-CM | POA: Diagnosis not present

## 2019-01-22 DIAGNOSIS — E782 Mixed hyperlipidemia: Secondary | ICD-10-CM | POA: Diagnosis not present

## 2019-02-03 DIAGNOSIS — M79674 Pain in right toe(s): Secondary | ICD-10-CM | POA: Diagnosis not present

## 2019-02-03 DIAGNOSIS — M2042 Other hammer toe(s) (acquired), left foot: Secondary | ICD-10-CM | POA: Diagnosis not present

## 2019-02-17 DIAGNOSIS — D519 Vitamin B12 deficiency anemia, unspecified: Secondary | ICD-10-CM | POA: Diagnosis not present

## 2019-02-17 DIAGNOSIS — D529 Folate deficiency anemia, unspecified: Secondary | ICD-10-CM | POA: Diagnosis not present

## 2019-02-17 DIAGNOSIS — D649 Anemia, unspecified: Secondary | ICD-10-CM | POA: Diagnosis not present

## 2019-02-20 DIAGNOSIS — E7849 Other hyperlipidemia: Secondary | ICD-10-CM | POA: Diagnosis not present

## 2019-02-20 DIAGNOSIS — I10 Essential (primary) hypertension: Secondary | ICD-10-CM | POA: Diagnosis not present

## 2019-02-20 DIAGNOSIS — E1142 Type 2 diabetes mellitus with diabetic polyneuropathy: Secondary | ICD-10-CM | POA: Diagnosis not present

## 2019-03-19 DIAGNOSIS — E1165 Type 2 diabetes mellitus with hyperglycemia: Secondary | ICD-10-CM | POA: Diagnosis not present

## 2019-03-19 DIAGNOSIS — I10 Essential (primary) hypertension: Secondary | ICD-10-CM | POA: Diagnosis not present

## 2019-03-20 DIAGNOSIS — I10 Essential (primary) hypertension: Secondary | ICD-10-CM | POA: Diagnosis not present

## 2019-03-20 DIAGNOSIS — E1165 Type 2 diabetes mellitus with hyperglycemia: Secondary | ICD-10-CM | POA: Diagnosis not present

## 2019-04-02 DIAGNOSIS — E1142 Type 2 diabetes mellitus with diabetic polyneuropathy: Secondary | ICD-10-CM | POA: Diagnosis not present

## 2019-04-02 DIAGNOSIS — D529 Folate deficiency anemia, unspecified: Secondary | ICD-10-CM | POA: Diagnosis not present

## 2019-04-02 DIAGNOSIS — E782 Mixed hyperlipidemia: Secondary | ICD-10-CM | POA: Diagnosis not present

## 2019-04-02 DIAGNOSIS — I10 Essential (primary) hypertension: Secondary | ICD-10-CM | POA: Diagnosis not present

## 2019-04-02 DIAGNOSIS — D519 Vitamin B12 deficiency anemia, unspecified: Secondary | ICD-10-CM | POA: Diagnosis not present

## 2019-04-02 DIAGNOSIS — R5383 Other fatigue: Secondary | ICD-10-CM | POA: Diagnosis not present

## 2019-04-02 DIAGNOSIS — D500 Iron deficiency anemia secondary to blood loss (chronic): Secondary | ICD-10-CM | POA: Diagnosis not present

## 2019-04-07 DIAGNOSIS — M2041 Other hammer toe(s) (acquired), right foot: Secondary | ICD-10-CM | POA: Diagnosis not present

## 2019-04-07 DIAGNOSIS — M2042 Other hammer toe(s) (acquired), left foot: Secondary | ICD-10-CM | POA: Diagnosis not present

## 2019-04-10 DIAGNOSIS — D500 Iron deficiency anemia secondary to blood loss (chronic): Secondary | ICD-10-CM | POA: Diagnosis not present

## 2019-04-10 DIAGNOSIS — I480 Paroxysmal atrial fibrillation: Secondary | ICD-10-CM | POA: Diagnosis not present

## 2019-04-10 DIAGNOSIS — K580 Irritable bowel syndrome with diarrhea: Secondary | ICD-10-CM | POA: Diagnosis not present

## 2019-04-10 DIAGNOSIS — F331 Major depressive disorder, recurrent, moderate: Secondary | ICD-10-CM | POA: Diagnosis not present

## 2019-04-10 DIAGNOSIS — E8881 Metabolic syndrome: Secondary | ICD-10-CM | POA: Diagnosis not present

## 2019-04-10 DIAGNOSIS — M1000 Idiopathic gout, unspecified site: Secondary | ICD-10-CM | POA: Diagnosis not present

## 2019-04-10 DIAGNOSIS — E782 Mixed hyperlipidemia: Secondary | ICD-10-CM | POA: Diagnosis not present

## 2019-04-10 DIAGNOSIS — I10 Essential (primary) hypertension: Secondary | ICD-10-CM | POA: Diagnosis not present

## 2019-04-10 DIAGNOSIS — E1142 Type 2 diabetes mellitus with diabetic polyneuropathy: Secondary | ICD-10-CM | POA: Diagnosis not present

## 2019-04-20 DIAGNOSIS — I10 Essential (primary) hypertension: Secondary | ICD-10-CM | POA: Diagnosis not present

## 2019-04-20 DIAGNOSIS — E7849 Other hyperlipidemia: Secondary | ICD-10-CM | POA: Diagnosis not present

## 2019-04-21 DIAGNOSIS — E7849 Other hyperlipidemia: Secondary | ICD-10-CM | POA: Diagnosis not present

## 2019-04-21 DIAGNOSIS — I10 Essential (primary) hypertension: Secondary | ICD-10-CM | POA: Diagnosis not present

## 2019-04-22 DIAGNOSIS — S80861A Insect bite (nonvenomous), right lower leg, initial encounter: Secondary | ICD-10-CM | POA: Diagnosis not present

## 2019-04-22 DIAGNOSIS — L03115 Cellulitis of right lower limb: Secondary | ICD-10-CM | POA: Diagnosis not present

## 2019-04-22 DIAGNOSIS — Z6837 Body mass index (BMI) 37.0-37.9, adult: Secondary | ICD-10-CM | POA: Diagnosis not present

## 2019-04-29 DIAGNOSIS — Z6837 Body mass index (BMI) 37.0-37.9, adult: Secondary | ICD-10-CM | POA: Diagnosis not present

## 2019-04-29 DIAGNOSIS — S80861D Insect bite (nonvenomous), right lower leg, subsequent encounter: Secondary | ICD-10-CM | POA: Diagnosis not present

## 2019-04-29 DIAGNOSIS — L03115 Cellulitis of right lower limb: Secondary | ICD-10-CM | POA: Diagnosis not present

## 2019-05-21 DIAGNOSIS — E1165 Type 2 diabetes mellitus with hyperglycemia: Secondary | ICD-10-CM | POA: Diagnosis not present

## 2019-05-21 DIAGNOSIS — E1142 Type 2 diabetes mellitus with diabetic polyneuropathy: Secondary | ICD-10-CM | POA: Diagnosis not present

## 2019-05-22 DIAGNOSIS — E1142 Type 2 diabetes mellitus with diabetic polyneuropathy: Secondary | ICD-10-CM | POA: Diagnosis not present

## 2019-05-22 DIAGNOSIS — E1165 Type 2 diabetes mellitus with hyperglycemia: Secondary | ICD-10-CM | POA: Diagnosis not present

## 2019-06-16 DIAGNOSIS — M79676 Pain in unspecified toe(s): Secondary | ICD-10-CM | POA: Diagnosis not present

## 2019-06-16 DIAGNOSIS — B351 Tinea unguium: Secondary | ICD-10-CM | POA: Diagnosis not present

## 2019-06-16 DIAGNOSIS — E1142 Type 2 diabetes mellitus with diabetic polyneuropathy: Secondary | ICD-10-CM | POA: Diagnosis not present

## 2019-06-16 DIAGNOSIS — L84 Corns and callosities: Secondary | ICD-10-CM | POA: Diagnosis not present

## 2019-06-22 DIAGNOSIS — E1142 Type 2 diabetes mellitus with diabetic polyneuropathy: Secondary | ICD-10-CM | POA: Diagnosis not present

## 2019-06-22 DIAGNOSIS — E1165 Type 2 diabetes mellitus with hyperglycemia: Secondary | ICD-10-CM | POA: Diagnosis not present

## 2019-06-22 DIAGNOSIS — E7849 Other hyperlipidemia: Secondary | ICD-10-CM | POA: Diagnosis not present

## 2019-06-22 DIAGNOSIS — I10 Essential (primary) hypertension: Secondary | ICD-10-CM | POA: Diagnosis not present

## 2019-06-27 DIAGNOSIS — B360 Pityriasis versicolor: Secondary | ICD-10-CM | POA: Diagnosis not present

## 2019-06-27 DIAGNOSIS — Z6837 Body mass index (BMI) 37.0-37.9, adult: Secondary | ICD-10-CM | POA: Diagnosis not present

## 2019-07-22 DIAGNOSIS — I480 Paroxysmal atrial fibrillation: Secondary | ICD-10-CM | POA: Diagnosis not present

## 2019-07-22 DIAGNOSIS — Z7984 Long term (current) use of oral hypoglycemic drugs: Secondary | ICD-10-CM | POA: Diagnosis not present

## 2019-07-22 DIAGNOSIS — I10 Essential (primary) hypertension: Secondary | ICD-10-CM | POA: Diagnosis not present

## 2019-07-22 DIAGNOSIS — E1165 Type 2 diabetes mellitus with hyperglycemia: Secondary | ICD-10-CM | POA: Diagnosis not present

## 2019-07-22 DIAGNOSIS — E7849 Other hyperlipidemia: Secondary | ICD-10-CM | POA: Diagnosis not present

## 2019-07-23 DIAGNOSIS — C801 Malignant (primary) neoplasm, unspecified: Secondary | ICD-10-CM

## 2019-07-23 HISTORY — DX: Malignant (primary) neoplasm, unspecified: C80.1

## 2019-08-08 DIAGNOSIS — N200 Calculus of kidney: Secondary | ICD-10-CM | POA: Diagnosis not present

## 2019-08-08 DIAGNOSIS — I10 Essential (primary) hypertension: Secondary | ICD-10-CM | POA: Diagnosis not present

## 2019-08-08 DIAGNOSIS — E119 Type 2 diabetes mellitus without complications: Secondary | ICD-10-CM | POA: Diagnosis not present

## 2019-08-08 DIAGNOSIS — R319 Hematuria, unspecified: Secondary | ICD-10-CM | POA: Diagnosis not present

## 2019-08-08 DIAGNOSIS — N281 Cyst of kidney, acquired: Secondary | ICD-10-CM | POA: Diagnosis not present

## 2019-08-08 DIAGNOSIS — D3502 Benign neoplasm of left adrenal gland: Secondary | ICD-10-CM | POA: Diagnosis not present

## 2019-08-08 DIAGNOSIS — D3500 Benign neoplasm of unspecified adrenal gland: Secondary | ICD-10-CM | POA: Diagnosis not present

## 2019-08-08 DIAGNOSIS — N2889 Other specified disorders of kidney and ureter: Secondary | ICD-10-CM | POA: Diagnosis not present

## 2019-08-10 DIAGNOSIS — N2889 Other specified disorders of kidney and ureter: Secondary | ICD-10-CM | POA: Diagnosis not present

## 2019-08-13 ENCOUNTER — Other Ambulatory Visit (HOSPITAL_COMMUNITY): Payer: Self-pay | Admitting: Urology

## 2019-08-13 ENCOUNTER — Ambulatory Visit (HOSPITAL_COMMUNITY)
Admission: RE | Admit: 2019-08-13 | Discharge: 2019-08-13 | Disposition: A | Discharge status: Home / Self Care | Payer: Medicare HMO | Source: Ambulatory Visit | Attending: Urology | Admitting: Urology

## 2019-08-13 ENCOUNTER — Other Ambulatory Visit: Payer: Self-pay

## 2019-08-13 DIAGNOSIS — D49512 Neoplasm of unspecified behavior of left kidney: Secondary | ICD-10-CM | POA: Diagnosis not present

## 2019-08-13 DIAGNOSIS — C642 Malignant neoplasm of left kidney, except renal pelvis: Secondary | ICD-10-CM | POA: Diagnosis not present

## 2019-08-14 ENCOUNTER — Telehealth: Payer: Self-pay | Admitting: *Deleted

## 2019-08-14 NOTE — Telephone Encounter (Signed)
Primary Cardiologist:Branch, Roderic Palau, MD  Chart reviewed as part of pre-operative protocol coverage. Because of Bradley Rodriguez past medical history and time since last visit, he/she will require a follow-up visit in order to better assess preoperative cardiovascular risk.  Pre-op covering staff: - Please schedule appointment and call patient to inform them. - Please contact requesting surgeon's office via preferred method (i.e, phone, fax) to inform them of need for appointment prior to surgery.  If applicable, this message will also be routed to pharmacy pool and/or primary cardiologist for input on holding anticoagulant/antiplatelet agent as requested below so that this information is available at time of patient's appointment.   Deberah Pelton, NP  08/14/2019, 1:29 PM

## 2019-08-14 NOTE — Telephone Encounter (Signed)
° °  [facility] Pre-operative Risk Assessment    HEARTCARE STAFF: - Please ensure there is not already an duplicate clearance open for this procedure. - Under Visit Info/Reason for Call, type in Other and utilize the format Clearance MM/DD/YY or Clearance TBD. Do not use dashes or single digits. - If request is for dental extraction, please clarify the # of teeth to be extracted.  Request for surgical clearance:  1. What type of surgery is being performed?  CYSTOLITHOLAPAXY / POSSIBLE TRANSURETHRAL RESECTION OF PHOSPHATE / LEFT ROBOTIC NEPHRECTOMY    2. When is this surgery scheduled?  TBD   3. What type of clearance is required (medical clearance vs. Pharmacy clearance to hold med vs. Both)?  BOTH  4. Are there any medications that need to be held prior to surgery and how long? CLEARANCE SAYS ASPIRIN X'S 5 DAYS    5. Practice name and name of physician performing surgery?  [facility] / DR. Lovena Neighbours   6. What is the office phone number?  [phone]   7.   What is the office fax number?  [phone]  8.   Anesthesia type (None, local, MAC, general) ?  GENERAL   Jeanann Lewandowsky 08/14/2019, 1:14 PM  _________________________________________________________________   (provider comments below)

## 2019-08-17 NOTE — Telephone Encounter (Signed)
Called patient and informed reason for call. Patient is scheduled to see Levell July, NP on 08/25/2019 at 9:00 AM. Patient verbalized understanding and all (if any) questions were answered.

## 2019-08-21 DIAGNOSIS — E1165 Type 2 diabetes mellitus with hyperglycemia: Secondary | ICD-10-CM | POA: Diagnosis not present

## 2019-08-21 DIAGNOSIS — I10 Essential (primary) hypertension: Secondary | ICD-10-CM | POA: Diagnosis not present

## 2019-08-21 DIAGNOSIS — Z7984 Long term (current) use of oral hypoglycemic drugs: Secondary | ICD-10-CM | POA: Diagnosis not present

## 2019-08-21 DIAGNOSIS — E7849 Other hyperlipidemia: Secondary | ICD-10-CM | POA: Diagnosis not present

## 2019-08-24 NOTE — Progress Notes (Addendum)
Cardiology Office Note  Date: 08/25/2019   ID: Bradley Rodriguez, DOB 06/03/1960, MRN [id number]  PCP:  Caryl Bis, MD  Cardiologist:  [name], MD Electrophysiologist:  None   Chief Complaint: Pre op cardiac clearance  CYSTOLITHOLAPAXY / POSSIBLE TRANSURETHRAL RESECTION OF PROSTATE / LEFT ROBOTIC NEPHRECTOMY    History of Present Illness: Bradley Rodriguez is a 59 y.o. male with a history of PAF followed by Dr. Bronson Ing in [place] clinic.  Previous DCCV's, last 08/10/2016.  From A. fib clinic notes he has failed prophylactic flecainide previously.  A. fib clinic notes that he usually required cardioversion by once per year and also intermittent compliance with Xarelto usually only taken around the time of this DCCV.  DC cardioversion 09/11/2018.  After cardioversion diltiazem which had been recently been started was stopped and restarted as prior Norvasc at 5 mg.  No complaints of recent palpitations.  Compliant with meds.  Blood pressure systolic has been in the 409W over 70s.  Historically does not tolerate even rate controlled atrial fibrillation.  He was taking Xarelto but was not sure he wanted to take it beyond 1 month after the cardioversion.  He stated he retrieved in contact with A. fib clinic when he was ready for follow-up.  His blood pressure was elevated in clinic but home numbers was regular.  He was to continue current medications.  If necessary.  Increase his Norvasc in the future.  Presentation to [facility] [place] room on 08/08/2019 for hematuria and urinary obstruction.  History of kidney stones.  Complaining of lower abdominal suprapubic discomfort.  CT scan abdomen pelvis showed small nonobstructive calculus in lower pole of each kidney.  There was a finding of a  6.3 x 6.2 cm low-density mass in the posterior aspect of the upper pole of the left kidney suspicious for solid mass per radiologist.  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  He is here for preop  cardiac clearance. He is to have surgery with [facility] with Dr Lovena Neighbours with cystolitholapaxy / possible TURP/ and left robotic nephrectomy.  He denies any recent progressive anginal or exertional symptoms, palpitations or arrhythmias, orthostatic symptoms, stroke or TIA-like symptoms, PND, orthopnea, bleeding other than recent hematuria.  No claudication-like symptoms, DVT or PE-like symptoms, or lower extremity edema.  Past Medical History:  Diagnosis Date  . A-fib [facility])    DCCV 2009, 2012, 2014, Nov 2016  . Anxiety   . Diabetes mellitus without complication ([place])   . History of kidney stones   . HX: anticoagulation    very remotely and briefly on COumadin aound one of his CV, 2014 on Eliquis had hematuria with renal calculi and stopped  . Hyperlipidemia    patient denies this  . Hypertension   . IBS (irritable bowel syndrome)   . Normal cardiac stress test    Normal nuclear stress test , 2001    Past Surgical History:  Procedure Laterality Date  . CARDIOVERSION N/A 10/15/2012   Procedure: CARDIOVERSION;  Surgeon: Larey Dresser, MD;  Location: [facility] ENDOSCOPY;  Service: Cardiovascular;  Laterality: N/A;  . CARDIOVERSION N/A 11/26/2014   Procedure: CARDIOVERSION;  Surgeon: Arnoldo Lenis, MD;  Location: [facility] ORS;  Service: Endoscopy;  Laterality: N/A;  . CARDIOVERSION N/A 11/15/2015   Procedure: CARDIOVERSION;  Surgeon: Jerline Pain, MD;  Location: [facility] ENDOSCOPY;  Service: Cardiovascular;  Laterality: N/A;  . CARDIOVERSION N/A 08/07/2016   Procedure: CARDIOVERSION;  Surgeon: Kate Sable  A, MD;  Location: [facility] ORS;  Service: Cardiovascular;  Laterality: N/A;  . CARDIOVERSION N/A 09/11/2018   Procedure: CARDIOVERSION;  Surgeon: Arnoldo Lenis, MD;  Location: [facility];  Service: Endoscopy;  Laterality: N/A;  . CATARACT EXTRACTION W/PHACO Right 01/18/2014   Procedure: CATARACT EXTRACTION PHACO AND INTRAOCULAR LENS PLACEMENT (IOC);  Surgeon: Tonny Branch, MD;  Location: [facility];   Service: Ophthalmology;  Laterality: Right;  CDE 9.95  . EYE SURGERY Right    detached retina  . FOOT SURGERY Left   . KNEE ARTHROSCOPY Right   . LITHOTRIPSY     for kidney stones 2002  . TEE WITHOUT CARDIOVERSION N/A 11/26/2014   Procedure: TRANSESOPHAGEAL ECHOCARDIOGRAM (TEE) WITH PROPOFOL;  Surgeon: Arnoldo Lenis, MD;  Location: [facility];  Service: Endoscopy;  Laterality: N/A;  . TEE WITHOUT CARDIOVERSION N/A 11/15/2015   Procedure: TRANSESOPHAGEAL ECHOCARDIOGRAM (TEE);  Surgeon: Jerline Pain, MD;  Location: [place];  Service: Cardiovascular;  Laterality: N/A;  . TEE WITHOUT CARDIOVERSION N/A 09/11/2018   Procedure: TRANSESOPHAGEAL ECHOCARDIOGRAM (TEE) WITH PROPOFOL;  Surgeon: Arnoldo Lenis, MD;  Location: [facility];  Service: Endoscopy;  Laterality: N/A;  . VASECTOMY      Current Outpatient Medications  Medication Sig Dispense Refill  . amLODipine (NORVASC) 5 MG tablet Take 1 tablet (5 mg total) by mouth daily. 90 tablet 3  . atorvastatin (LIPITOR) 20 MG tablet Take 20 mg by mouth every evening.     . calcium-vitamin D (OSCAL WITH D) 500-200 MG-UNIT tablet Take 1 tablet by mouth every morning.    Marland Kitchen losartan (COZAAR) 100 MG tablet Take 100 mg by mouth every evening.     . metFORMIN (GLUCOPHAGE-XR) 500 MG 24 hr tablet Take 1 tablet (500 mg total) by mouth 2 (two) times daily.  3  . metoprolol tartrate (LOPRESSOR) 100 MG tablet TAKE 1 TABLET BY MOUTH TWICE A DAY 60 tablet 2  . rivaroxaban (XARELTO) 20 MG TABS tablet Take one pill a day 90 tablet 7  . Turmeric 500 MG CAPS Take 1,000 mg by mouth every morning.    . vitamin C (ASCORBIC ACID) 500 MG tablet Take 1,000 mg by mouth daily.    . Vitamin D, Ergocalciferol, (DRISDOL) 50000 UNITS CAPS capsule Take 50,000 Units by mouth every Wednesday.      No current facility-administered medications for this visit.   Allergies:  Ace inhibitors, Lisinopril, and Quinine derivatives   Social History: The patient  reports that he has  never smoked. He has never used smokeless tobacco. He reports that he does not drink alcohol and does not use drugs.   Family History: The patient's family history includes Diabetes in his father and mother; Hypertension in his mother and another family member.   ROS:  Please see the history of present illness. Otherwise, complete review of systems is positive for none.  All other systems are reviewed and negative.   Physical Exam: VS:  There were no vitals taken for this visit., BMI There is no height or weight on file to calculate BMI.  Wt Readings from Last 3 Encounters:  09/30/18 (!) 347 lb (157.4 kg)  09/09/18 (!) 347 lb (157.4 kg)  09/08/18 (!) 347 lb (157.4 kg)    General: Patient appears comfortable at rest. Neck: Supple, no elevated JVP or carotid bruits, no thyromegaly. Lungs: Clear to auscultation, nonlabored breathing at rest. Cardiac: Regular rate and rhythm, no S3 or significant systolic murmur, no pericardial rub. Marland Kitchen Extremities: No pitting edema, distal  pulses 2+. Skin: Warm and dry. Musculoskeletal: No kyphosis. Neuropsychiatric: Alert and oriented x3, affect grossly appropriate.  ECG: EKG today shows normal sinus rhythm rate of 60, early repolarization  Recent Labwork: 09/06/2018: ALT 30; AST 25; Hemoglobin 12.3; Platelets 286 09/09/2018: BUN 19; Creatinine, Ser 1.16; Potassium 4.0; Sodium 139  No results found for: CHOL, TRIG, HDL, CHOLHDL, VLDL, LDLCALC, LDLDIRECT  Other Studies Reviewed Today:  Echocardiogram 09/11/2018  1. The left ventricle has normal systolic function, with an ejection fraction of 55-60%. The cavity size was normal. 2. Left atrial size was Left atrium is enlarged. 3. No evidence of intracardiac thrombus. Normal LA appendage without thrombus, normal emptying velocity 70cm/s. 4. Mitral valve regurgitation is mild to moderate by color flow Doppler. 5. The aortic valve is tricuspid No stenosis of the aortic valve. 6. The aortic root is normal  in size and structure.  Assessment and Plan:  1. Pre-operative clearance   2. Paroxysmal atrial fibrillation (HCC)   3. Essential hypertension    1. Pre-operative clearance: Recent discovery of left renal neoplasm: Scheduled for cystolitholapaxy, possible TURP, left nephrectomy.  Hold aspirin x5 days.  [facility] Dr. Lovena Neighbours is performing the surgery.  Patient is already stopped his Xarelto due to side effects.  Sent visit summary to Dr. Gilford Rile office at [facility] urology fax [phone]  Revised cardiac risk index total points equal 1 representing a 0.9% risk of major perioperative cardiac event.  Patient is cleared from a cardiac standpoint to undergo left nephrectomy under general anesthesia.  2. Paroxysmal atrial fibrillation (HCC) EKG today shows normal sinus rhythm with a rate of 60 bpm.  Patient denies any recent episodes of palpitations.  States he stopped his Xarelto due to side effects such as shortness of breath.  He continues on aspirin 81 mg daily.  He does not wish for any further anticoagulation therapy.  He is aware of the risk of stroke per his statement.  Continue metoprolol 100 mg p.o. twice daily.  3. Essential hypertension Blood pressure well controlled on current therapy.  Continue amlodipine 5 mg p.o. daily.  Continue losartan 100 mg p.o. daily.  Continue metoprolol 100 mg p.o. twice daily.   Medication Adjustments/Labs and Tests Ordered: Current medicines are reviewed at length with the patient today.  Concerns regarding medicines are outlined above.   Disposition: Follow-up with Dr. Harl Bowie or [facility] in 1 year  Signed, Levell July, NP 08/25/2019 9:12 AM    [facility] [place], [place], [place] [address] Phone: [phone]; Fax: [phone]

## 2019-08-25 ENCOUNTER — Encounter: Payer: Self-pay | Admitting: Family Medicine

## 2019-08-25 ENCOUNTER — Ambulatory Visit: Payer: Medicare HMO | Admitting: Family Medicine

## 2019-08-25 VITALS — BP 118/78 | HR 62 | Ht >= 80 in | Wt 336.8 lb

## 2019-08-25 DIAGNOSIS — M79676 Pain in unspecified toe(s): Secondary | ICD-10-CM | POA: Diagnosis not present

## 2019-08-25 DIAGNOSIS — E1142 Type 2 diabetes mellitus with diabetic polyneuropathy: Secondary | ICD-10-CM | POA: Diagnosis not present

## 2019-08-25 DIAGNOSIS — I480 Paroxysmal atrial fibrillation: Secondary | ICD-10-CM | POA: Diagnosis not present

## 2019-08-25 DIAGNOSIS — Z01818 Encounter for other preprocedural examination: Secondary | ICD-10-CM

## 2019-08-25 DIAGNOSIS — B351 Tinea unguium: Secondary | ICD-10-CM | POA: Diagnosis not present

## 2019-08-25 DIAGNOSIS — I10 Essential (primary) hypertension: Secondary | ICD-10-CM

## 2019-08-25 DIAGNOSIS — L84 Corns and callosities: Secondary | ICD-10-CM | POA: Diagnosis not present

## 2019-08-25 NOTE — Patient Instructions (Signed)

## 2019-08-25 NOTE — Addendum Note (Signed)
Addended by: Laurine Blazer on: 08/25/2019 05:35 PM   Modules accepted: Orders

## 2019-08-31 NOTE — Telephone Encounter (Signed)
Returned call to pt.  I left a message for him letting him know that his surgical clearance has been sent to [facility].

## 2019-08-31 NOTE — Telephone Encounter (Signed)
Patient is calling to check on the status of his clearance, states the Dr. Performing this surgery has not heard anything back regarding this clearance.

## 2019-09-09 ENCOUNTER — Other Ambulatory Visit: Payer: Self-pay | Admitting: Urology

## 2019-09-14 ENCOUNTER — Other Ambulatory Visit: Payer: Self-pay | Admitting: Urology

## 2019-09-14 DIAGNOSIS — D4102 Neoplasm of uncertain behavior of left kidney: Secondary | ICD-10-CM

## 2019-09-14 NOTE — Progress Notes (Signed)
COVID Vaccine Completed: Date COVID Vaccine completed: COVID vaccine manufacturer: [place]   PCP - Gar Ponto, MD Cardiologist - [name], MD  Clearance on chart dated 08-25-19 from Levell July, NP  Chest x-ray - 08-13-19 in Epic  EKG - 08-25-19 in Epic Stress Test -  ECHO - 09-11-18 in Epic  Cardiac Cath -   Sleep Study -  CPAP -   Fasting Blood Sugar -  Checks Blood Sugar _____ times a day  Blood Thinner Instructions: Aspirin Instructions: ASA 81 mg.  On hold for surgery Last Dose:  Anesthesia review: Afib with hx of cardioversion  Patient denies shortness of breath, fever, cough and chest pain at PAT appointment   Patient verbalized understanding of instructions that were given to them at the PAT appointment. Patient was also instructed that they will need to review over the PAT instructions again at home before surgery.

## 2019-09-14 NOTE — Patient Instructions (Addendum)
DUE TO COVID-19 ONLY ONE VISITOR IS ALLOWED TO COME WITH YOU AND STAY IN THE WAITING ROOM ONLY DURING PRE  OP AND PROCEDURE.   IF YOU WILL BE ADMITTED INTO THE HOSPITAL YOU ARE ALLOWED ONE SUPPORT PERSON DURING VISITATION HOURS ONLY  (10AM -8PM)   . The support person may change daily. . The support person must pass our screening, gel in and out, and wear a mask at all times, including in the patient's room. . Patients must also wear a mask when staff or their support person are in the room.   COVID SWAB TESTING MUST BE COMPLETED ON:  Saturday, 09-19-19 @    [address] [place], [place] [address]  (Must self quarantine after testing. Follow instructions on handout.)        Your procedure is scheduled on:  Wednesday, 09-23-19   Report to [facility] Main  Entrance   Report to admitting at 8:30 AM   Call this number if you have problems the morning of surgery [phone]   Do not eat food :After Midnight.   May have liquids until 7:30 AM   day of surgery  CLEAR LIQUID DIET  Foods Allowed                                                                     Foods Excluded  Water, Black Coffee and tea, regular and decaf             liquids that you cannot  Plain Jell-O in any flavor  (No red)                                    see through such as: Fruit ices (not with fruit pulp)                                       milk, soups, orange juice              Iced Popsicles (No red)                                      All solid food                                   Apple juices Sports drinks like Gatorade (No red) Lightly seasoned clear broth or consume(fat free) Sugar, honey syrup    Oral Hygiene is also important to reduce your risk of infection.                                     Remember - BRUSH YOUR TEETH THE MORNING OF SURGERY WITH YOUR REGULAR TOOTHPASTE   Do NOT smoke after Midnight   Take these medicines the morning of surgery with A SIP OF WATER:  Metoprolol,  Tamsulosin  How to Manage Your Diabetes  Before and After Surgery  Why is it important to control my blood sugar before and after surgery? . Improving blood sugar levels before and after surgery helps healing and can limit problems. . A way of improving blood sugar control is eating a healthy diet by: o  Eating less sugar and carbohydrates o  Increasing activity/exercise o  Talking with your doctor about reaching your blood sugar goals . High blood sugars (greater than 180 mg/dL) can raise your risk of infections and slow your recovery, so you will need to focus on controlling your diabetes during the weeks before surgery. . Make sure that the doctor who takes care of your diabetes knows about your planned surgery including the date and location.  How do I manage my blood sugar before surgery? . Check your blood sugar at least 4 times a day, starting 2 days before surgery, to make sure that the level is not too high or low. o Check your blood sugar the morning of your surgery when you wake up and every 2 hours until you get to the Short Stay unit. . If your blood sugar is less than 70 mg/dL, you will need to treat for low blood sugar: o Do not take insulin. o Treat a low blood sugar (less than 70 mg/dL) with  cup of clear juice (cranberry or apple), 4 glucose tablets, OR glucose gel. o Recheck blood sugar in 15 minutes after treatment (to make sure it is greater than 70 mg/dL). If your blood sugar is not greater than 70 mg/dL on recheck, call [phone] for further instructions. . Report your blood sugar to the short stay nurse when you get to Short Stay.  . If you are admitted to the hospital after surgery: o Your blood sugar will be checked by the staff and you will probably be given insulin after surgery (instead of oral diabetes medicines) to make sure you have good blood sugar levels. o The goal for blood sugar control after surgery is 80-180 mg/dL.                                  You may not have any metal on your body including jewelry, and body piercings              Do not wear lotions, powders, perfumes/cologne, or deodorant                      Men may shave face and neck.    Do not bring valuables to the hospital. [place].   Contacts, dentures or bridgework may not be worn into surgery.   Bring small overnight bag day of surgery.    Special Instructions: Bring a copy of your healthcare power of attorney and living will documents the day of surgery if you haven't  scanned them in before.              Please read over the following fact sheets you were given: IF YOU HAVE QUESTIONS ABOUT YOUR PRE OP INSTRUCTIONS PLEASE  CALL [phone]   [place] - Preparing for Surgery Before surgery, you can play an important role.  Because skin is not sterile, your skin needs to be as free of germs as possible.  You can reduce the number of germs on your skin by washing with CHG (chlorahexidine gluconate) soap before surgery.  CHG is an antiseptic cleaner which kills germs and bonds with the skin to continue killing germs even after washing. Please DO NOT use if you have an allergy to CHG or antibacterial soaps.  If your skin becomes reddened/irritated stop using the CHG and inform your nurse when you arrive at Short Stay. Do not shave (including legs and underarms) for at least 48 hours prior to the first CHG shower.  You may shave your face/neck.  Please follow these instructions carefully:  1.  Shower with CHG Soap the night before surgery and the  morning of surgery.  2.  If you choose to wash your hair, wash your hair first as usual with your normal  shampoo.  3.  After you shampoo, rinse your hair and body thoroughly to remove the shampoo.                             4.  Use CHG as you would any other liquid soap.  You can apply chg directly to the skin and wash.  Gently with a scrungie or clean washcloth.  5.  Apply the CHG  Soap to your body ONLY FROM THE NECK DOWN.   Do   not use on face/ open                           Wound or open sores. Avoid contact with eyes, ears mouth and   genitals (private parts).                       Wash face,  Genitals (private parts) with your normal soap.             6.  Wash thoroughly, paying special attention to the area where your    surgery  will be performed.  7.  Thoroughly rinse your body with warm water from the neck down.  8.  DO NOT shower/wash with your normal soap after using and rinsing off the CHG Soap.                9.  Pat yourself dry with a clean towel.            10.  Wear clean pajamas.            11.  Place clean sheets on your bed the night of your first shower and do not  sleep with pets. Day of Surgery : Do not apply any lotions/deodorants the morning of surgery.  Please wear clean clothes to the hospital/surgery center.  FAILURE TO FOLLOW THESE INSTRUCTIONS MAY RESULT IN THE CANCELLATION OF YOUR SURGERY  PATIENT SIGNATURE_________________________________  NURSE SIGNATURE__________________________________  ________________________________________________________________________    Adam Phenix  An incentive spirometer is a tool that can help keep your lungs clear and active. This tool measures how well you are filling your lungs with each breath. Taking long deep breaths may help reverse or decrease the chance of developing breathing (pulmonary) problems (especially infection) following:  A long period of time when you are unable to move or be active. BEFORE THE PROCEDURE   If the spirometer includes an indicator to show your best effort, your nurse or respiratory therapist will set it to a desired goal.  If possible, sit up straight or lean slightly forward. Try not to slouch.  Hold the incentive spirometer in an upright position. INSTRUCTIONS FOR USE  1.  Sit on the edge of your bed if possible, or sit up as far as you can in bed or  on a chair. 2. Hold the incentive spirometer in an upright position. 3. Breathe out normally. 4. Place the mouthpiece in your mouth and seal your lips tightly around it. 5. Breathe in slowly and as deeply as possible, raising the piston or the ball toward the top of the column. 6. Hold your breath for 3-5 seconds or for as long as possible. Allow the piston or ball to fall to the bottom of the column. 7. Remove the mouthpiece from your mouth and breathe out normally. 8. Rest for a few seconds and repeat Steps 1 through 7 at least 10 times every 1-2 hours when you are awake. Take your time and take a few normal breaths between deep breaths. 9. The spirometer may include an indicator to show your best effort. Use the indicator as a goal to work toward during each repetition. 10. After each set of 10 deep breaths, practice coughing to be sure your lungs are clear. If you have an incision (the cut made at the time of surgery), support your incision when coughing by placing a pillow or rolled up towels firmly against it. Once you are able to get out of bed, walk around indoors and cough well. You may stop using the incentive spirometer when instructed by your caregiver.  RISKS AND COMPLICATIONS  Take your time so you do not get dizzy or light-headed.  If you are in pain, you may need to take or ask for pain medication before doing incentive spirometry. It is harder to take a deep breath if you are having pain. AFTER USE  Rest and breathe slowly and easily.  It can be helpful to keep track of a log of your progress. Your caregiver can provide you with a simple table to help with this. If you are using the spirometer at home, follow these instructions: [place] IF:   You are having difficultly using the spirometer.  You have trouble using the spirometer as often as instructed.  Your pain medication is not giving enough relief while using the spirometer.  You develop fever of 100.5 F  (38.1 C) or higher. SEEK IMMEDIATE MEDICAL CARE IF:   You cough up bloody sputum that had not been present before.  You develop fever of 102 F (38.9 C) or greater.  You develop worsening pain at or near the incision site. MAKE SURE YOU:   Understand these instructions.  Will watch your condition.  Will get help right away if you are not doing well or get worse. Document Released: 05/21/2006 Document Revised: 04/02/2011 Document Reviewed: 07/22/2006 [facility] Patient Information 2014 [place], [place].   ________________________________________________________________________

## 2019-09-18 ENCOUNTER — Encounter (HOSPITAL_COMMUNITY)
Admission: RE | Admit: 2019-09-18 | Discharge: 2019-09-18 | Disposition: A | Discharge status: Home / Self Care | Payer: Medicare HMO | Source: Ambulatory Visit | Attending: Urology | Admitting: Urology

## 2019-09-22 DIAGNOSIS — I10 Essential (primary) hypertension: Secondary | ICD-10-CM | POA: Diagnosis not present

## 2019-09-22 DIAGNOSIS — E7849 Other hyperlipidemia: Secondary | ICD-10-CM | POA: Diagnosis not present

## 2019-09-22 DIAGNOSIS — Z7984 Long term (current) use of oral hypoglycemic drugs: Secondary | ICD-10-CM | POA: Diagnosis not present

## 2019-09-22 DIAGNOSIS — E1165 Type 2 diabetes mellitus with hyperglycemia: Secondary | ICD-10-CM | POA: Diagnosis not present

## 2019-09-23 NOTE — Patient Instructions (Addendum)
DUE TO COVID-19 ONLY ONE VISITOR IS ALLOWED TO COME WITH YOU AND STAY IN THE WAITING ROOM ONLY DURING PRE OP AND PROCEDURE DAY OF SURGERY. THE 1 VISITOR  MAY VISIT WITH YOU AFTER SURGERY IN YOUR PRIVATE ROOM DURING VISITING HOURS ONLY!  YOU NEED TO HAVE A COVID 19 TEST ON_9/10______ @__9 :45_____, THIS TEST MUST BE DONE BEFORE SURGERY,  COVID TESTING SITE [place] [address], IT IS ON THE RIGHT GOING OUT [address] APPROXIMATELY  2 MINUTES PAST [employer] ON THE RIGHT. ONCE YOUR COVID TEST IS COMPLETED,  PLEASE BEGIN THE QUARANTINE INSTRUCTIONS AS OUTLINED IN YOUR HANDOUT.                Mickle Mallory    Your procedure is scheduled on: 10/06/19   Report to [facility] Main  Entrance   Report to admitting at  8:30 AM     Call this number if you have problems the morning of surgery [phone]    Remember: Do not eat food or drink liquids :After Midnight   BRUSH YOUR TEETH MORNING OF SURGERY AND RINSE YOUR MOUTH OUT, NO CHEWING GUM CANDY OR MINTS.     Take these medicines the morning of surgery with A SIP OF WATER: Metoprolol, Tamsulosin  DO NOT TAKE ANY DIABETIC MEDICATIONS DAY OF YOUR SURGERY   How to Manage Your Diabetes Before and After Surgery  Why is it important to control my blood sugar before and after surgery? . Improving blood sugar levels before and after surgery helps healing and can limit problems. . A way of improving blood sugar control is eating a healthy diet by: o  Eating less sugar and carbohydrates o  Increasing activity/exercise o  Talking with your doctor about reaching your blood sugar goals . High blood sugars (greater than 180 mg/dL) can raise your risk of infections and slow your recovery, so you will need to focus on controlling your diabetes during the weeks before surgery. . Make sure that the doctor who takes care of your diabetes knows about your planned surgery including the date and location.  How do I  manage my blood sugar before surgery? . Check your blood sugar at least 4 times a day, starting 2 days before surgery, to make sure that the level is not too high or low. o Check your blood sugar the morning of your surgery when you wake up and every 2 hours until you get to the Short Stay unit. . If your blood sugar is less than 70 mg/dL, you will need to treat for low blood sugar: o Do not take insulin. o Treat a low blood sugar (less than 70 mg/dL) with  cup of clear juice (cranberry or apple), 4 glucose tablets, OR glucose gel. o Recheck blood sugar in 15 minutes after treatment (to make sure it is greater than 70 mg/dL). If your blood sugar is not greater than 70 mg/dL on recheck, call [phone] for further instructions. . Report your blood sugar to the short stay nurse when you get to Short Stay.  . If you are admitted to the hospital after surgery: o Your blood sugar will be checked by the staff and you will probably be given insulin after surgery (instead of oral diabetes medicines) to make sure you have good blood sugar levels. o The goal for blood sugar control after surgery is 80-180 mg/dL.   WHAT DO I DO ABOUT MY DIABETES MEDICATION?  Marland Kitchen Do  not take oral diabetes medicines (pills) the morning of surgery.  . The day of surgery, do not take other diabetes injectables, including Byetta (exenatide), Bydureon (exenatide ER), Victoza (liraglutide), or Trulicity (dulaglutide).               You may not have any metal on your body including               piercings  Do not wear jewelry, lotions, powders or deodorant                      Men may shave face and neck.   Do not bring valuables to the hospital. [place].  Contacts, dentures or bridgework may not be worn into surgery.                    Please read over the following fact sheets you were  given: _____________________________________________________________________             [facility] - Preparing for Surgery Before surgery, you can play an important role.   Because skin is not sterile, your skin needs to be as free of germs as possible.   You can reduce the number of germs on your skin by washing with CHG (chlorahexidine gluconate) soap before surgery.  CHG is an antiseptic cleaner which kills germs and bonds with the skin to continue killing germs even after washing. Please DO NOT use if you have an allergy to CHG or antibacterial soaps.   If your skin becomes reddened/irritated stop using the CHG and inform your nurse when you arrive at Short Stay.  You may shave your face/neck.  Please follow these instructions carefully:  1.  Shower with CHG Soap the night before surgery and the  morning of Surgery.  2.  If you choose to wash your hair, wash your hair first as usual with your  normal  shampoo.  3.  After you shampoo, rinse your hair and body thoroughly to remove the  shampoo.                                        4.  Use CHG as you would any other liquid soap.  You can apply chg directly  to the skin and wash                       Gently with a scrungie or clean washcloth.  5.  Apply the CHG Soap to your body ONLY FROM THE NECK DOWN.   Do not use on face/ open                           Wound or open sores. Avoid contact with eyes, ears mouth and genitals (private parts).                       Wash face,  Genitals (private parts) with your normal soap.             6.  Wash thoroughly, paying special attention to the area where your surgery  will be performed.  7.  Thoroughly rinse your body with warm water from the neck down.  8.  DO NOT shower/wash with your normal soap after using and rinsing off  the CHG Soap.             9.  Pat yourself dry with a clean towel.            10.  Wear clean pajamas.            11.  Place clean sheets on your bed the night of your  first shower and do not  sleep with pets. Day of Surgery : Do not apply any lotions/deodorants the morning of surgery.  Please wear clean clothes to the hospital/surgery center.  FAILURE TO FOLLOW THESE INSTRUCTIONS MAY RESULT IN THE CANCELLATION OF YOUR SURGERY PATIENT SIGNATURE_________________________________  NURSE SIGNATURE__________________________________  ________________________________________________________________________   Adam Phenix  An incentive spirometer is a tool that can help keep your lungs clear and active. This tool measures how well you are filling your lungs with each breath. Taking long deep breaths may help reverse or decrease the chance of developing breathing (pulmonary) problems (especially infection) following:  A long period of time when you are unable to move or be active. BEFORE THE PROCEDURE   If the spirometer includes an indicator to show your best effort, your nurse or respiratory therapist will set it to a desired goal.  If possible, sit up straight or lean slightly forward. Try not to slouch.  Hold the incentive spirometer in an upright position. INSTRUCTIONS FOR USE  1. Sit on the edge of your bed if possible, or sit up as far as you can in bed or on a chair. 2. Hold the incentive spirometer in an upright position. 3. Breathe out normally. 4. Place the mouthpiece in your mouth and seal your lips tightly around it. 5. Breathe in slowly and as deeply as possible, raising the piston or the ball toward the top of the column. 6. Hold your breath for 3-5 seconds or for as long as possible. Allow the piston or ball to fall to the bottom of the column. 7. Remove the mouthpiece from your mouth and breathe out normally. 8. Rest for a few seconds and repeat Steps 1 through 7 at least 10 times every 1-2 hours when you are awake. Take your time and take a few normal breaths between deep breaths. 9. The spirometer may include an indicator to show  your best effort. Use the indicator as a goal to work toward during each repetition. 10. After each set of 10 deep breaths, practice coughing to be sure your lungs are clear. If you have an incision (the cut made at the time of surgery), support your incision when coughing by placing a pillow or rolled up towels firmly against it. Once you are able to get out of bed, walk around indoors and cough well. You may stop using the incentive spirometer when instructed by your caregiver.  RISKS AND COMPLICATIONS  Take your time so you do not get dizzy or light-headed.  If you are in pain, you may need to take or ask for pain medication before doing incentive spirometry. It is harder to take a deep breath if you are having pain. AFTER USE  Rest and breathe slowly and easily.  It can be helpful to keep track of a log of your progress. Your caregiver can provide you with a simple table to help with this. If you are using the spirometer at home, follow these instructions: [place] IF:   You are having difficultly using  the spirometer.  You have trouble using the spirometer as often as instructed.  Your pain medication is not giving enough relief while using the spirometer.  You develop fever of 100.5 F (38.1 C) or higher. SEEK IMMEDIATE MEDICAL CARE IF:   You cough up bloody sputum that had not been present before.  You develop fever of 102 F (38.9 C) or greater.  You develop worsening pain at or near the incision site. MAKE SURE YOU:   Understand these instructions.  Will watch your condition.  Will get help right away if you are not doing well or get worse. Document Released: 05/21/2006 Document Revised: 04/02/2011 Document Reviewed: 07/22/2006 [facility] Patient Information 2014 [place], [place].   ________________________________________________________________________

## 2019-09-24 ENCOUNTER — Encounter (HOSPITAL_COMMUNITY)
Admission: RE | Admit: 2019-09-24 | Discharge: 2019-09-24 | Disposition: A | Discharge status: Home / Self Care | Payer: Medicare HMO | Source: Ambulatory Visit | Attending: Urology | Admitting: Urology

## 2019-09-24 ENCOUNTER — Other Ambulatory Visit: Payer: Self-pay

## 2019-09-24 ENCOUNTER — Encounter (HOSPITAL_COMMUNITY): Payer: Self-pay

## 2019-09-24 DIAGNOSIS — Z01812 Encounter for preprocedural laboratory examination: Secondary | ICD-10-CM | POA: Insufficient documentation

## 2019-09-24 HISTORY — DX: Unspecified osteoarthritis, unspecified site: M19.90

## 2019-09-24 LAB — BASIC METABOLIC PANEL
Anion gap: 9 (ref 5–15)
BUN: 17 mg/dL (ref 6–20)
CO2: 26 mmol/L (ref 22–32)
Calcium: 9.3 mg/dL (ref 8.9–10.3)
Chloride: 104 mmol/L (ref 98–111)
Creatinine, Ser: 0.9 mg/dL (ref 0.61–1.24)
GFR calc Af Amer: >60 mL/min (ref >60)
GFR calc non Af Amer: >60 mL/min (ref >60)
Glucose, Bld: 154 mg/dL — ABNORMAL HIGH (ref 70–99)
Potassium: 4.9 mmol/L (ref 3.5–5.1)
Sodium: 139 mmol/L (ref 135–145)

## 2019-09-24 LAB — CBC
HCT: 35.3 % — ABNORMAL LOW (ref 39.0–52.0)
Hemoglobin: 10.9 g/dL — ABNORMAL LOW (ref 13.0–17.0)
MCH: 27.7 pg (ref 26.0–34.0)
MCHC: 30.9 g/dL (ref 30.0–36.0)
MCV: 89.8 fL (ref 80.0–100.0)
Platelets: 327 10*3/uL (ref 150–400)
RBC: 3.93 MIL/uL — ABNORMAL LOW (ref 4.22–5.81)
RDW: 14.1 % (ref 11.5–15.5)
WBC: 6.1 10*3/uL (ref 4.0–10.5)
nRBC: 0 % (ref 0.0–0.2)

## 2019-09-24 LAB — GLUCOSE, CAPILLARY: Glucose-Capillary: 152 mg/dL — ABNORMAL HIGH (ref 70–99)

## 2019-09-24 LAB — HEMOGLOBIN A1C
Hgb A1c MFr Bld: 6.5 % — ABNORMAL HIGH (ref 4.8–5.6)
Mean Plasma Glucose: 139.85 mg/dL

## 2019-09-24 NOTE — Progress Notes (Signed)
I called portable equipment to ask about a bed extension for this Pt who is 6'8". They said that they will order a bari bed for tall people.

## 2019-09-24 NOTE — Progress Notes (Signed)
COVID Vaccine Completed:N Date COVID Vaccine completed: COVID vaccine manufacturer: [place]   PCP -  Cardiologist -   Chest x-ray -  EKG -  Stress Test - no ECHO -  Cardiac Cath -   Sleep Study - Yes-" years ago" CPAP - no  Fasting Blood Sugar - 120-140 Checks Blood Sugar _____ times a day  Blood Thinner Instructions:ASA/ Dr. Quillian Quince Aspirin Instructions:Stop 7 days prior/ Winter Last Dose:09/11/19  Anesthesia review:   Patient denies shortness of breath, fever, cough and chest pain at PAT appointmentyes   Patient verbalized understanding of instructions that were given to them at the PAT appointment. Patient was also instructed that they will need to review over the PAT instructions again at home before surgery. Yes  Pt says that he doesn't snore any more because he sleeps on his side. He has a 19 " neck but he is 6'8" and is big all over. He has no SOB climbing stairs , working or with ADLs

## 2019-09-27 ENCOUNTER — Other Ambulatory Visit: Payer: Self-pay

## 2019-09-27 ENCOUNTER — Ambulatory Visit
Admission: RE | Admit: 2019-09-27 | Discharge: 2019-09-27 | Disposition: A | Discharge status: Home / Self Care | Payer: Medicare HMO | Source: Ambulatory Visit | Attending: Urology | Admitting: Urology

## 2019-09-27 DIAGNOSIS — D4102 Neoplasm of uncertain behavior of left kidney: Secondary | ICD-10-CM

## 2019-09-27 DIAGNOSIS — N281 Cyst of kidney, acquired: Secondary | ICD-10-CM | POA: Diagnosis not present

## 2019-09-27 DIAGNOSIS — D3500 Benign neoplasm of unspecified adrenal gland: Secondary | ICD-10-CM | POA: Diagnosis not present

## 2019-09-27 DIAGNOSIS — N2889 Other specified disorders of kidney and ureter: Secondary | ICD-10-CM | POA: Diagnosis not present

## 2019-09-27 DIAGNOSIS — I700 Atherosclerosis of aorta: Secondary | ICD-10-CM | POA: Diagnosis not present

## 2019-09-27 MED ORDER — GADOBENATE DIMEGLUMINE 529 MG/ML IV SOLN
20.0000 mL | Freq: Once | INTRAVENOUS | Status: AC | PRN
  Administered 2019-09-27: 20 mL via INTRAVENOUS

## 2019-09-29 DIAGNOSIS — N218 Other lower urinary tract calculus: Secondary | ICD-10-CM | POA: Diagnosis not present

## 2019-09-29 DIAGNOSIS — D49512 Neoplasm of unspecified behavior of left kidney: Secondary | ICD-10-CM | POA: Diagnosis not present

## 2019-09-29 DIAGNOSIS — N390 Urinary tract infection, site not specified: Secondary | ICD-10-CM | POA: Diagnosis not present

## 2019-09-30 ENCOUNTER — Ambulatory Visit: Payer: Medicare HMO | Admitting: Urology

## 2019-10-02 ENCOUNTER — Other Ambulatory Visit (HOSPITAL_COMMUNITY)
Admission: RE | Admit: 2019-10-02 | Discharge: 2019-10-02 | Disposition: A | Discharge status: Home / Self Care | Payer: Medicare HMO | Source: Ambulatory Visit | Attending: Urology | Admitting: Urology

## 2019-10-02 DIAGNOSIS — Z01812 Encounter for preprocedural laboratory examination: Secondary | ICD-10-CM | POA: Diagnosis not present

## 2019-10-02 DIAGNOSIS — Z20822 Contact with and (suspected) exposure to covid-19: Secondary | ICD-10-CM | POA: Insufficient documentation

## 2019-10-02 LAB — SARS CORONAVIRUS 2 (TAT 6-24 HRS): SARS Coronavirus 2: NEGATIVE

## 2019-10-05 ENCOUNTER — Encounter (HOSPITAL_COMMUNITY): Payer: Self-pay | Admitting: Urology

## 2019-10-05 MED ORDER — DEXTROSE 5 % IV SOLN
3.0000 g | INTRAVENOUS | Status: DC
  Filled 2019-10-05: qty 3000

## 2019-10-05 NOTE — Anesthesia Preprocedure Evaluation (Addendum)
Anesthesia Evaluation  Patient identified by MRN, date of birth, ID band Patient awake    Reviewed: Allergy & Precautions, NPO status , Patient's Chart, lab work & pertinent test results  Airway Mallampati: II  TM Distance: >3 FB Neck ROM: Full    Dental no notable dental hx. (+) Teeth Intact, Dental Advisory Given   Pulmonary neg pulmonary ROS,    Pulmonary exam normal breath sounds clear to auscultation       Cardiovascular hypertension, Pt. on medications and Pt. on home beta blockers Normal cardiovascular exam+ dysrhythmias Atrial Fibrillation  Rhythm:Regular Rate:Normal     Neuro/Psych negative neurological ROS  negative psych ROS   GI/Hepatic negative GI ROS,   Endo/Other  diabetes, Well Controlled, Type 2  Renal/GU Renal disease     Musculoskeletal  (+) Arthritis ,   Abdominal   Peds  Hematology negative hematology ROS (+)   Anesthesia Other Findings   Reproductive/Obstetrics                            Anesthesia Physical Anesthesia Plan  ASA: III  Anesthesia Plan: General   Post-op Pain Management:    Induction: Intravenous  PONV Risk Score and Plan: 3 and Treatment may vary due to age or medical condition, Ondansetron, Dexamethasone and Midazolam  Airway Management Planned: Oral ETT  Additional Equipment: None  Intra-op Plan:   Post-operative Plan: Extubation in OR  Informed Consent: I have reviewed the patients History and Physical, chart, labs and discussed the procedure including the risks, benefits and alternatives for the proposed anesthesia with the patient or authorized representative who has indicated his/her understanding and acceptance.     Dental advisory given  Plan Discussed with: CRNA  Anesthesia Plan Comments:        Anesthesia Quick Evaluation

## 2019-10-06 ENCOUNTER — Encounter (HOSPITAL_COMMUNITY)
Admission: RE | Disposition: A | Discharge status: Home / Self Care | Payer: Self-pay | Source: Home / Self Care | Attending: Urology

## 2019-10-06 ENCOUNTER — Inpatient Hospital Stay (HOSPITAL_COMMUNITY): Payer: Medicare HMO | Admitting: Anesthesiology

## 2019-10-06 ENCOUNTER — Other Ambulatory Visit: Payer: Self-pay

## 2019-10-06 ENCOUNTER — Other Ambulatory Visit: Payer: Self-pay | Admitting: Urology

## 2019-10-06 ENCOUNTER — Inpatient Hospital Stay (HOSPITAL_COMMUNITY)
Admission: RE | Admit: 2019-10-06 | Discharge: 2019-10-07 | DRG: 658 | Disposition: A | Discharge status: Home / Self Care | Payer: Medicare HMO | Attending: Urology | Admitting: Urology

## 2019-10-06 ENCOUNTER — Encounter (HOSPITAL_COMMUNITY): Payer: Self-pay | Admitting: Urology

## 2019-10-06 DIAGNOSIS — Z888 Allergy status to other drugs, medicaments and biological substances status: Secondary | ICD-10-CM | POA: Diagnosis not present

## 2019-10-06 DIAGNOSIS — D3502 Benign neoplasm of left adrenal gland: Secondary | ICD-10-CM | POA: Diagnosis not present

## 2019-10-06 DIAGNOSIS — I4891 Unspecified atrial fibrillation: Secondary | ICD-10-CM | POA: Diagnosis present

## 2019-10-06 DIAGNOSIS — N210 Calculus in bladder: Secondary | ICD-10-CM | POA: Diagnosis present

## 2019-10-06 DIAGNOSIS — K589 Irritable bowel syndrome without diarrhea: Secondary | ICD-10-CM | POA: Diagnosis not present

## 2019-10-06 DIAGNOSIS — R351 Nocturia: Secondary | ICD-10-CM | POA: Diagnosis present

## 2019-10-06 DIAGNOSIS — E785 Hyperlipidemia, unspecified: Secondary | ICD-10-CM | POA: Diagnosis not present

## 2019-10-06 DIAGNOSIS — N2889 Other specified disorders of kidney and ureter: Secondary | ICD-10-CM | POA: Diagnosis present

## 2019-10-06 DIAGNOSIS — E119 Type 2 diabetes mellitus without complications: Secondary | ICD-10-CM | POA: Diagnosis present

## 2019-10-06 DIAGNOSIS — I700 Atherosclerosis of aorta: Secondary | ICD-10-CM | POA: Diagnosis present

## 2019-10-06 DIAGNOSIS — R3915 Urgency of urination: Secondary | ICD-10-CM | POA: Diagnosis not present

## 2019-10-06 DIAGNOSIS — I10 Essential (primary) hypertension: Secondary | ICD-10-CM | POA: Diagnosis present

## 2019-10-06 DIAGNOSIS — C642 Malignant neoplasm of left kidney, except renal pelvis: Principal | ICD-10-CM | POA: Diagnosis present

## 2019-10-06 DIAGNOSIS — Z87442 Personal history of urinary calculi: Secondary | ICD-10-CM | POA: Diagnosis not present

## 2019-10-06 DIAGNOSIS — D49512 Neoplasm of unspecified behavior of left kidney: Secondary | ICD-10-CM | POA: Diagnosis not present

## 2019-10-06 HISTORY — PX: CYSTOSCOPY WITH LITHOLAPAXY: SHX1425

## 2019-10-06 HISTORY — PX: ROBOT ASSISTED LAPAROSCOPIC NEPHRECTOMY: SHX5140

## 2019-10-06 LAB — PREPARE RBC (CROSSMATCH)

## 2019-10-06 LAB — GLUCOSE, CAPILLARY
Glucose-Capillary: 146 mg/dL — ABNORMAL HIGH (ref 70–99)
Glucose-Capillary: 198 mg/dL — ABNORMAL HIGH (ref 70–99)

## 2019-10-06 LAB — HEMOGLOBIN AND HEMATOCRIT, BLOOD
HCT: 33.1 % — ABNORMAL LOW (ref 39.0–52.0)
Hemoglobin: 10.2 g/dL — ABNORMAL LOW (ref 13.0–17.0)

## 2019-10-06 LAB — ABO/RH: ABO/RH(D): O POS

## 2019-10-06 SURGERY — NEPHRECTOMY, RADICAL, ROBOT-ASSISTED, LAPAROSCOPIC, ADULT
Anesthesia: General

## 2019-10-06 MED ORDER — MIDAZOLAM HCL 5 MG/5ML IJ SOLN
INTRAMUSCULAR | Status: DC | PRN
  Administered 2019-10-06: 2 mg via INTRAVENOUS

## 2019-10-06 MED ORDER — CEFAZOLIN SODIUM-DEXTROSE 2-4 GM/100ML-% IV SOLN
2.0000 g | Freq: Three times a day (TID) | INTRAVENOUS | Status: AC
  Administered 2019-10-06 – 2019-10-07 (×2): 2 g via INTRAVENOUS
  Filled 2019-10-06 (×2): qty 100

## 2019-10-06 MED ORDER — SODIUM CHLORIDE 0.9% FLUSH
3.0000 mL | Freq: Two times a day (BID) | INTRAVENOUS | Status: DC
  Administered 2019-10-06: 3 mL via INTRAVENOUS

## 2019-10-06 MED ORDER — GLYCOPYRROLATE PF 0.2 MG/ML IJ SOSY
PREFILLED_SYRINGE | INTRAMUSCULAR | Status: DC | PRN
  Administered 2019-10-06: .2 mg via INTRAVENOUS

## 2019-10-06 MED ORDER — LACTATED RINGERS IV SOLN: INTRAVENOUS | Status: DC | PRN

## 2019-10-06 MED ORDER — CHLORHEXIDINE GLUCONATE CLOTH 2 % EX PADS
6.0000 | MEDICATED_PAD | Freq: Every day | CUTANEOUS | Status: DC
  Administered 2019-10-07: 6 via TOPICAL

## 2019-10-06 MED ORDER — DOCUSATE SODIUM 100 MG PO CAPS
100.0000 mg | ORAL_CAPSULE | Freq: Two times a day (BID) | ORAL | Status: DC
  Administered 2019-10-06: 100 mg via ORAL
  Filled 2019-10-06: qty 1

## 2019-10-06 MED ORDER — FENTANYL CITRATE (PF) 100 MCG/2ML IJ SOLN
INTRAMUSCULAR | Status: AC
  Filled 2019-10-06: qty 2

## 2019-10-06 MED ORDER — SUGAMMADEX SODIUM 500 MG/5ML IV SOLN
INTRAVENOUS | Status: DC | PRN
  Administered 2019-10-06: 350 mg via INTRAVENOUS

## 2019-10-06 MED ORDER — ALBUMIN HUMAN 5 % IV SOLN: INTRAVENOUS | Status: DC | PRN

## 2019-10-06 MED ORDER — ORAL CARE MOUTH RINSE: 15.0000 mL | Freq: Once | OROMUCOSAL | Status: AC

## 2019-10-06 MED ORDER — SODIUM CHLORIDE 0.9 % IV SOLN: 250.0000 mL | INTRAVENOUS | Status: DC | PRN

## 2019-10-06 MED ORDER — ROCURONIUM BROMIDE 10 MG/ML (PF) SYRINGE
PREFILLED_SYRINGE | INTRAVENOUS | Status: DC | PRN
  Administered 2019-10-06: 100 mg via INTRAVENOUS
  Administered 2019-10-06 (×3): 30 mg via INTRAVENOUS
  Administered 2019-10-06: 20 mg via INTRAVENOUS
  Administered 2019-10-06: 40 mg via INTRAVENOUS

## 2019-10-06 MED ORDER — STERILE WATER FOR IRRIGATION IR SOLN
Status: DC | PRN
  Administered 2019-10-06: 1000 mL

## 2019-10-06 MED ORDER — LACTATED RINGERS IR SOLN
Status: DC | PRN
  Administered 2019-10-06: 1

## 2019-10-06 MED ORDER — ALBUMIN HUMAN 5 % IV SOLN
INTRAVENOUS | Status: AC
  Filled 2019-10-06: qty 250

## 2019-10-06 MED ORDER — FENTANYL CITRATE (PF) 250 MCG/5ML IJ SOLN
INTRAMUSCULAR | Status: AC
  Filled 2019-10-06: qty 5

## 2019-10-06 MED ORDER — PHENYLEPHRINE 40 MCG/ML (10ML) SYRINGE FOR IV PUSH (FOR BLOOD PRESSURE SUPPORT)
PREFILLED_SYRINGE | INTRAVENOUS | Status: DC | PRN
  Administered 2019-10-06: 80 ug via INTRAVENOUS
  Administered 2019-10-06: 120 ug via INTRAVENOUS
  Administered 2019-10-06: 40 ug via INTRAVENOUS
  Administered 2019-10-06: 80 ug via INTRAVENOUS
  Administered 2019-10-06: 120 ug via INTRAVENOUS
  Administered 2019-10-06: 80 ug via INTRAVENOUS

## 2019-10-06 MED ORDER — ROCURONIUM BROMIDE 10 MG/ML (PF) SYRINGE
PREFILLED_SYRINGE | INTRAVENOUS | Status: AC
  Filled 2019-10-06: qty 10

## 2019-10-06 MED ORDER — SUGAMMADEX SODIUM 500 MG/5ML IV SOLN
INTRAVENOUS | Status: AC
  Filled 2019-10-06: qty 5

## 2019-10-06 MED ORDER — CEFAZOLIN SODIUM-DEXTROSE 1-4 GM/50ML-% IV SOLN
INTRAVENOUS | Status: DC | PRN
  Administered 2019-10-06: 3 g via INTRAVENOUS

## 2019-10-06 MED ORDER — PROMETHAZINE HCL 25 MG/ML IJ SOLN: 6.2500 mg | INTRAMUSCULAR | Status: DC | PRN

## 2019-10-06 MED ORDER — SODIUM CHLORIDE 0.9% IV SOLUTION: Freq: Once | INTRAVENOUS | Status: DC

## 2019-10-06 MED ORDER — CHLORHEXIDINE GLUCONATE 0.12 % MT SOLN
15.0000 mL | Freq: Once | OROMUCOSAL | Status: AC
  Administered 2019-10-06: 15 mL via OROMUCOSAL

## 2019-10-06 MED ORDER — ATORVASTATIN CALCIUM 20 MG PO TABS
20.0000 mg | ORAL_TABLET | Freq: Every evening | ORAL | Status: DC
  Administered 2019-10-06: 20 mg via ORAL
  Filled 2019-10-06: qty 1

## 2019-10-06 MED ORDER — DEXMEDETOMIDINE (PRECEDEX) IN NS 20 MCG/5ML (4 MCG/ML) IV SYRINGE
PREFILLED_SYRINGE | INTRAVENOUS | Status: DC | PRN
  Administered 2019-10-06: 16 ug via INTRAVENOUS

## 2019-10-06 MED ORDER — SODIUM CHLORIDE 0.9 % IV SOLN: INTRAVENOUS | Status: DC

## 2019-10-06 MED ORDER — PHENYLEPHRINE 40 MCG/ML (10ML) SYRINGE FOR IV PUSH (FOR BLOOD PRESSURE SUPPORT)
PREFILLED_SYRINGE | INTRAVENOUS | Status: AC
  Filled 2019-10-06: qty 10

## 2019-10-06 MED ORDER — MORPHINE SULFATE (PF) 2 MG/ML IV SOLN
1.0000 mg | INTRAVENOUS | Status: DC | PRN
  Administered 2019-10-06: 1 mg via INTRAVENOUS
  Administered 2019-10-06 (×2): 2 mg via INTRAVENOUS
  Filled 2019-10-06 (×3): qty 1

## 2019-10-06 MED ORDER — TAMSULOSIN HCL 0.4 MG PO CAPS
0.4000 mg | ORAL_CAPSULE | Freq: Every day | ORAL | Status: DC
  Administered 2019-10-06 – 2019-10-07 (×2): 0.4 mg via ORAL
  Filled 2019-10-06 (×2): qty 1

## 2019-10-06 MED ORDER — AMLODIPINE BESYLATE 5 MG PO TABS
5.0000 mg | ORAL_TABLET | Freq: Every evening | ORAL | Status: DC
  Administered 2019-10-06: 5 mg via ORAL
  Filled 2019-10-06: qty 1

## 2019-10-06 MED ORDER — METOPROLOL TARTRATE 50 MG PO TABS
100.0000 mg | ORAL_TABLET | Freq: Two times a day (BID) | ORAL | Status: DC
  Administered 2019-10-06 – 2019-10-07 (×2): 100 mg via ORAL
  Filled 2019-10-06 (×2): qty 2

## 2019-10-06 MED ORDER — ACETAMINOPHEN 500 MG PO TABS
1000.0000 mg | ORAL_TABLET | Freq: Four times a day (QID) | ORAL | Status: AC
  Administered 2019-10-06 – 2019-10-07 (×4): 1000 mg via ORAL
  Filled 2019-10-06 (×4): qty 2

## 2019-10-06 MED ORDER — EPHEDRINE SULFATE-NACL 50-0.9 MG/10ML-% IV SOSY
PREFILLED_SYRINGE | INTRAVENOUS | Status: DC | PRN
  Administered 2019-10-06: 5 mg via INTRAVENOUS
  Administered 2019-10-06: 10 mg via INTRAVENOUS
  Administered 2019-10-06: 5 mg via INTRAVENOUS

## 2019-10-06 MED ORDER — BUPIVACAINE LIPOSOME 1.3 % IJ SUSP
20.0000 mL | Freq: Once | INTRAMUSCULAR | Status: AC
  Administered 2019-10-06: 20 mL
  Filled 2019-10-06: qty 20

## 2019-10-06 MED ORDER — EPHEDRINE 5 MG/ML INJ
INTRAVENOUS | Status: AC
  Filled 2019-10-06: qty 10

## 2019-10-06 MED ORDER — SODIUM CHLORIDE 0.9% FLUSH: 3.0000 mL | INTRAVENOUS | Status: DC | PRN

## 2019-10-06 MED ORDER — FENTANYL CITRATE (PF) 100 MCG/2ML IJ SOLN
INTRAMUSCULAR | Status: DC | PRN
Start: 2019-10-06 — End: 2019-10-06
  Administered 2019-10-06: 50 ug via INTRAVENOUS
  Administered 2019-10-06: 100 ug via INTRAVENOUS
  Administered 2019-10-06 (×4): 25 ug via INTRAVENOUS

## 2019-10-06 MED ORDER — LACTATED RINGERS IV SOLN: INTRAVENOUS | Status: DC

## 2019-10-06 MED ORDER — ONDANSETRON HCL 4 MG/2ML IJ SOLN: 4.0000 mg | INTRAMUSCULAR | Status: DC | PRN

## 2019-10-06 MED ORDER — GLYCOPYRROLATE PF 0.2 MG/ML IJ SOSY
PREFILLED_SYRINGE | INTRAMUSCULAR | Status: AC
  Filled 2019-10-06: qty 1

## 2019-10-06 MED ORDER — DEXAMETHASONE SODIUM PHOSPHATE 10 MG/ML IJ SOLN
INTRAMUSCULAR | Status: DC | PRN
  Administered 2019-10-06: 10 mg via INTRAVENOUS

## 2019-10-06 MED ORDER — MIDAZOLAM HCL 2 MG/2ML IJ SOLN
INTRAMUSCULAR | Status: AC
  Filled 2019-10-06: qty 2

## 2019-10-06 MED ORDER — PROPOFOL 10 MG/ML IV BOLUS
INTRAVENOUS | Status: DC | PRN
  Administered 2019-10-06: 200 mg via INTRAVENOUS

## 2019-10-06 MED ORDER — OXYCODONE HCL 5 MG PO TABS
5.0000 mg | ORAL_TABLET | ORAL | Status: DC | PRN
  Administered 2019-10-06 – 2019-10-07 (×4): 5 mg via ORAL
  Filled 2019-10-06 (×4): qty 1

## 2019-10-06 MED ORDER — PROPOFOL 10 MG/ML IV BOLUS
INTRAVENOUS | Status: AC
  Filled 2019-10-06: qty 20

## 2019-10-06 MED ORDER — LIDOCAINE 2% (20 MG/ML) 5 ML SYRINGE
INTRAMUSCULAR | Status: DC | PRN
  Administered 2019-10-06: 100 mg via INTRAVENOUS

## 2019-10-06 MED ORDER — ONDANSETRON HCL 4 MG/2ML IJ SOLN
INTRAMUSCULAR | Status: DC | PRN
  Administered 2019-10-06: 4 mg via INTRAVENOUS

## 2019-10-06 MED ORDER — BUPIVACAINE-EPINEPHRINE (PF) 0.25% -1:200000 IJ SOLN
INTRAMUSCULAR | Status: AC
  Filled 2019-10-06: qty 30

## 2019-10-06 MED ORDER — BUPIVACAINE-EPINEPHRINE 0.25% -1:200000 IJ SOLN
INTRAMUSCULAR | Status: DC | PRN
  Administered 2019-10-06: 30 mL

## 2019-10-06 MED ORDER — SODIUM CHLORIDE 0.9 % IR SOLN
Status: DC | PRN
  Administered 2019-10-06: 6000 mL via INTRAVESICAL

## 2019-10-06 MED ORDER — FENTANYL CITRATE (PF) 100 MCG/2ML IJ SOLN
25.0000 ug | INTRAMUSCULAR | Status: DC | PRN
  Administered 2019-10-06: 50 ug via INTRAVENOUS

## 2019-10-06 MED ORDER — SENNOSIDES-DOCUSATE SODIUM 8.6-50 MG PO TABS
2.0000 | ORAL_TABLET | Freq: Every day | ORAL | Status: DC
  Administered 2019-10-06: 2 via ORAL
  Filled 2019-10-06: qty 2

## 2019-10-06 MED ORDER — DEXMEDETOMIDINE (PRECEDEX) IN NS 20 MCG/5ML (4 MCG/ML) IV SYRINGE
PREFILLED_SYRINGE | INTRAVENOUS | Status: AC
  Filled 2019-10-06: qty 5

## 2019-10-06 SURGICAL SUPPLY — 74 items
ADH SKN CLS APL DERMABOND .7 (GAUZE/BANDAGES/DRESSINGS) ×2
APL PRP STRL LF DISP 70% ISPRP (MISCELLANEOUS) ×2
BAG DRN RND TRDRP ANRFLXCHMBR (UROLOGICAL SUPPLIES) ×2
BAG LAPAROSCOPIC 12 15 PORT 16 (BASKET) ×2 IMPLANT
BAG RETRIEVAL 12/15 (BASKET) ×3
BAG RETRIEVAL 12/15MM (BASKET) ×1
BAG URINE DRAIN 2000ML AR STRL (UROLOGICAL SUPPLIES) ×4 IMPLANT
BAG URO CATCHER STRL LF (MISCELLANEOUS) ×4 IMPLANT
CATH FOLEY 3WAY 30CC 22FR (CATHETERS) IMPLANT
CHLORAPREP W/TINT 26 (MISCELLANEOUS) ×4 IMPLANT
CLIP VESOLOCK LG 6/CT PURPLE (CLIP) ×4 IMPLANT
CLIP VESOLOCK MED LG 6/CT (CLIP) ×4 IMPLANT
CLIP VESOLOCK XL 6/CT (CLIP) ×4 IMPLANT
CLOTH BEACON ORANGE TIMEOUT ST (SAFETY) ×4 IMPLANT
COVER SURGICAL LIGHT HANDLE (MISCELLANEOUS) ×4 IMPLANT
COVER TIP SHEARS 8 DVNC (MISCELLANEOUS) ×2 IMPLANT
COVER TIP SHEARS 8MM DA VINCI (MISCELLANEOUS) ×4
COVER WAND RF STERILE (DRAPES) IMPLANT
CUTTER ECHEON FLEX ENDO 45 340 (ENDOMECHANICALS) ×2 IMPLANT
DECANTER SPIKE VIAL GLASS SM (MISCELLANEOUS) ×4 IMPLANT
DERMABOND ADVANCED (GAUZE/BANDAGES/DRESSINGS) ×2
DERMABOND ADVANCED .7 DNX12 (GAUZE/BANDAGES/DRESSINGS) ×2 IMPLANT
DRAPE ARM DVNC X/XI (DISPOSABLE) ×8 IMPLANT
DRAPE COLUMN DVNC XI (DISPOSABLE) ×2 IMPLANT
DRAPE DA VINCI XI ARM (DISPOSABLE) ×16
DRAPE DA VINCI XI COLUMN (DISPOSABLE) ×4
DRAPE INCISE IOBAN 66X45 STRL (DRAPES) ×4 IMPLANT
DRAPE SHEET LG 3/4 BI-LAMINATE (DRAPES) ×4 IMPLANT
ELECT REM PT RETURN 15FT ADLT (MISCELLANEOUS) ×4 IMPLANT
GLOVE BIO SURGEON STRL SZ 6.5 (GLOVE) ×3 IMPLANT
GLOVE BIO SURGEONS STRL SZ 6.5 (GLOVE) ×1
GLOVE BIOGEL M STRL SZ7.5 (GLOVE) ×8 IMPLANT
GOWN STRL REUS W/TWL LRG LVL3 (GOWN DISPOSABLE) ×4 IMPLANT
GOWN STRL REUS W/TWL XL LVL3 (GOWN DISPOSABLE) ×8 IMPLANT
HEMOSTAT SURGICEL 4X8 (HEMOSTASIS) ×4 IMPLANT
HOLDER FOLEY CATH W/STRAP (MISCELLANEOUS) IMPLANT
IRRIG SUCT STRYKERFLOW 2 WTIP (MISCELLANEOUS) ×4
IRRIGATION SUCT STRKRFLW 2 WTP (MISCELLANEOUS) ×2 IMPLANT
KIT BASIN OR (CUSTOM PROCEDURE TRAY) ×4 IMPLANT
KIT TURNOVER KIT A (KITS) IMPLANT
LASER FIB FLEXIVA PULSE ID 550 (Laser) ×4 IMPLANT
LASER FIB FLEXIVA PULSE ID 910 (Laser) ×6 IMPLANT
LOOP CUT BIPOLAR 24F LRG (ELECTROSURGICAL) IMPLANT
MANIFOLD NEPTUNE II (INSTRUMENTS) ×4 IMPLANT
NDL INSUFFLATION 14GA 120MM (NEEDLE) ×2 IMPLANT
NEEDLE INSUFFLATION 14GA 120MM (NEEDLE) ×4 IMPLANT
NS IRRIG 1000ML POUR BTL (IV SOLUTION) ×4 IMPLANT
PACK CYSTO (CUSTOM PROCEDURE TRAY) ×4 IMPLANT
PENCIL SMOKE EVACUATOR (MISCELLANEOUS) IMPLANT
PROTECTOR NERVE ULNAR (MISCELLANEOUS) ×8 IMPLANT
RELOAD STAPLE 45 2.6 WHT THIN (STAPLE) IMPLANT
SEAL CANN UNIV 5-8 DVNC XI (MISCELLANEOUS) ×6 IMPLANT
SEAL XI 5MM-8MM UNIVERSAL (MISCELLANEOUS) ×12
SET TUBE SMOKE EVAC HIGH FLOW (TUBING) ×4 IMPLANT
SOLUTION ELECTROLUBE (MISCELLANEOUS) ×4 IMPLANT
STAPLE RELOAD 45 WHT (STAPLE) ×6 IMPLANT
STAPLE RELOAD 45MM WHITE (STAPLE) ×12
SUT MNCRL AB 4-0 PS2 18 (SUTURE) ×8 IMPLANT
SUT PDS AB 0 CT1 36 (SUTURE) ×8 IMPLANT
SUT VIC AB 2-0 SH 27 (SUTURE) ×4
SUT VIC AB 2-0 SH 27X BRD (SUTURE) IMPLANT
SUT VICRYL 0 UR6 27IN ABS (SUTURE) ×4 IMPLANT
SYR TOOMEY IRRIG 70ML (MISCELLANEOUS) ×4
SYRINGE TOOMEY IRRIG 70ML (MISCELLANEOUS) ×2 IMPLANT
TOWEL OR 17X26 10 PK STRL BLUE (TOWEL DISPOSABLE) ×4 IMPLANT
TOWEL OR NON WOVEN STRL DISP B (DISPOSABLE) ×4 IMPLANT
TRAY FOLEY MTR SLVR 16FR STAT (SET/KITS/TRAYS/PACK) ×4 IMPLANT
TRAY LAPAROSCOPIC (CUSTOM PROCEDURE TRAY) ×4 IMPLANT
TROCAR BLADELESS OPT 5 100 (ENDOMECHANICALS) IMPLANT
TROCAR XCEL 12X100 BLDLESS (ENDOMECHANICALS) ×4 IMPLANT
TUBING CONNECTING 10 (TUBING) ×3 IMPLANT
TUBING CONNECTING 10' (TUBING) ×1
TUBING UROLOGY SET (TUBING) ×4 IMPLANT
WATER STERILE IRR 1000ML POUR (IV SOLUTION) ×4 IMPLANT

## 2019-10-06 NOTE — Transfer of Care (Signed)
Immediate Anesthesia Transfer of Care Note  Patient: Bradley Rodriguez  Procedure(s) Performed: XI ROBOTIC ASSISTED LAPAROSCOPIC NEPHRECTOMY WITH ADRENALECTOMY (Left ) CYSTOSCOPY WITH LASER LITHOLAPAXY (N/A )  Patient Location: PACU  Anesthesia Type:General  Level of Consciousness: awake and oriented  Airway & Oxygen Therapy: Patient Spontanous Breathing and Patient connected to face mask oxygen  Post-op Assessment: Report given to RN and Post -op Vital signs reviewed and stable  Post vital signs: Reviewed and stable  Last Vitals:  Vitals Value Taken Time  BP 138/83 10/06/19 1509  Temp    Pulse 79 10/06/19 1511  Resp 22 10/06/19 1511  SpO2 100 % 10/06/19 1511  Vitals shown include unvalidated device data.  Last Pain:  Vitals:   10/06/19 0846  TempSrc: Oral  PainSc:          Complications: No complications documented.

## 2019-10-06 NOTE — Anesthesia Procedure Notes (Signed)
Procedure Name: Intubation Date/Time: 10/06/2019 10:40 AM Performed by: Lavina Hamman, CRNA Pre-anesthesia Checklist: Patient identified, Emergency Drugs available, Suction available and Patient being monitored Patient Re-evaluated:Patient Re-evaluated prior to induction Oxygen Delivery Method: Circle System Utilized Preoxygenation: Pre-oxygenation with 100% oxygen Induction Type: IV induction Ventilation: Mask ventilation without difficulty Laryngoscope Size: Mac and 4 Grade View: Grade I Tube type: Oral Tube size: 8.0 mm Number of attempts: 1 Airway Equipment and Method: Stylet and Oral airway Placement Confirmation: ETT inserted through vocal cords under direct vision,  positive ETCO2 and breath sounds checked- equal and bilateral Secured at: 24 cm Tube secured with: Tape Dental Injury: Teeth and Oropharynx as per pre-operative assessment

## 2019-10-06 NOTE — H&P (Signed)
PRE-OP H&P   Office Visit Report     09/29/2019   --------------------------------------------------------------------------------   Brysun Eschmann  MRN: [id number]  DOB: 1960/10/15, 59 year old Male   PRIMARY CARE:  Mitzie Na. Quillian Quince, MD  REFERRING:  Mitzie Na. Quillian Quince, MD  PROVIDER:  Ellison Hughs, M.D.  TREATING:  Jiles Crocker, NP  LOCATION:  [facility] [phone]     --------------------------------------------------------------------------------   CC/HPI: 1. Left renal mass   08/13/2019: Mr. Kolbe is a 59 year old male who was found to have a 6 cm solid appearing lesion involving the upper pole of the left kidney on CT from 08/08/19 during an evaluation of ongoing flank pain. He was also noted to have a stable left adrenal adenoma, a left renal cyst, 1.5 cm prostate stone and bilateral non-obstructing punctate renal stones.   PMHx- Afib, DM2, HTN, HLD and kidney stones   From a urinary standpoint, the patient reports a weak force of stream, dysuria and intermittent episodes of gross hematuria over the past 2 weeks. He was noted to have a 1.5 cm prostatic urethral stone on recent CT At baseline, the patient feels like he has an adequate force of stream and empties his bladder well. He denies any prior history of urinary tract infections.    09/29/2019: This gentleman presents today for pre-operative appointment prior to undergoing laparoscopic radical left nephrectomy as well as litholapaxy of a known prostate urethral calculi with possibility for TURP on 09/14 with his urologist. MRI of the abdomen done prior to today's appointment noted enhancement of the upper pole left renal mass consistent with renal cell carcinoma. There was no evidence of left renal vein involvement or abdominal metastatic disease. A left adrenal adenoma was also noted. A previously obtained chest x-ray showed no obvious metastatic disease as well.   Patient was started on tamsulosin at time of  last office visit in an effort to help augment his baseline bothersome lower urinary tract symptoms. He has had some improvement with force of stream but still bothered by daytime frequency/urgency, nocturia which has decreased some but still getting up every couple of hours. Patient reports still having some intermittency of urine stream as well as occasional discomfort with voiding. Denies any interval passage of hematuria, clot or tissue material. He has had no left-sided pain or discomfort. Denies interval fevers or chills, nausea/vomiting. No changes to baseline medical history or prescription medications since time of last office visit. No interval treatment for infection.     ALLERGIES: Lisinopril TABS QuiNIDine Sulfate TABS    MEDICATIONS: Metformin Hcl 500 mg tablet  Tamsulosin Hcl 0.4 mg capsule 1 capsule PO Daily  Amlodipine Besilate  Losartan Potassium  Metoprolol Succinate ER TB24 Oral  Ozempic     GU PSH: Cysto Uretero Lithotripsy - 2011 Cystoscopy Insert Stent - 2011 Cystoscopy Ureteroscopy - 2011 ESWL - 2011 Vasectomy - 2011       [place] Notes: Cystoscopy With Ureteroscopy With Lithotripsy, Cystoscopy With Insertion Of Ureteral Stent Left, Cystoscopy With Ureteroscopy, Lithotripsy, Surgery Of Male Genitalia Vasectomy,   Elective Cardioversion   NON-GU PSH: Foot surgery (unspecified)     GU PMH: Left renal neoplasm - 08/13/2019 Other LUT calculus - 08/13/2019 Adrenal mass Unspec, Adrenal cortical adenoma, unspecified laterality - 2014 History of urolithiasis, Nephrolithiasis - 2014 Ureteral calculus, Proximal Ureteral Stone On The Left - 2014      [place] Notes:  1898-01-22 00:00:00 - Note: Normal Routine History And Physical Adult   IBS  Hyperlipidemia   NON-GU PMH: Anxiety, Anxiety (Symptom) - 2014 Personal history of other diseases of the circulatory system, History of hypertension - 2014 Personal history of other diseases of the digestive system, History of  esophageal reflux - 2014 Unspecified atrial fibrillation, Atrial Fibrillation - 2014 Diabetes Type 2 Hypertension    FAMILY HISTORY: Family Health Status Number - Runs In Family nephrolithiasis - Runs In Family   SOCIAL HISTORY: Marital Status: Married Current Smoking Status: Patient has never smoked.   Tobacco Use Assessment Completed: Used Tobacco in last 30 days? Has not drank since 07/23/1979.  Does not drink caffeine. Patient's occupation is/was Disabled.     Notes: Tobacco Use, Occupation:, Marital History - Currently Married, Alcohol Use   REVIEW OF SYSTEMS:    GU Review Male:   Patient reports hard to postpone urination, burning/ pain with urination, get up at night to urinate, and stream starts and stops. Patient denies frequent urination, leakage of urine, trouble starting your stream, have to strain to urinate , erection problems, and penile pain.  Gastrointestinal (Upper):   Patient denies nausea, vomiting, and indigestion/ heartburn.  Gastrointestinal (Lower):   Patient denies diarrhea and constipation.  Constitutional:   Patient denies fever, night sweats, weight loss, and fatigue.  Skin:   Patient denies skin rash/ lesion and itching.  Eyes:   Patient denies blurred vision and double vision.  Ears/ Nose/ Throat:   Patient denies sore throat and sinus problems.  Hematologic/Lymphatic:   Patient denies swollen glands and easy bruising.  Cardiovascular:   Patient denies leg swelling and chest pains.  Respiratory:   Patient denies cough and shortness of breath.  Endocrine:   Patient denies excessive thirst.  Musculoskeletal:   Patient reports joint pain. Patient denies back pain.  Neurological:   Patient denies headaches and dizziness.  Psychologic:   Patient denies depression and anxiety.   Notes: Nocturia 15x per night. Updated from previous visit 08/13/2019 with review from patient as noted above.   VITAL SIGNS:      09/29/2019 08:40 AM  Weight 330 lb / 149.69 kg   Height 79 in / 200.66 cm  BP 124/68 mmHg  Pulse 60 /min  Temperature 97.1 F / 36.1 C  BMI 37.2 kg/m   MULTI-SYSTEM PHYSICAL EXAMINATION:    Constitutional: Well-nourished. No physical deformities. Normally developed. Good grooming.  Neck: Neck symmetrical, not swollen. Normal tracheal position.  Respiratory: No labored breathing, no use of accessory muscles.   Cardiovascular: Normal temperature, normal extremity pulses, no swelling, no varicosities.  Skin: No paleness, no jaundice, no cyanosis. No lesion, no ulcer, no rash.  Neurologic / Psychiatric: Oriented to time, oriented to place, oriented to person. No depression, no anxiety, no agitation.  Gastrointestinal: Obese abdomen. No mass, no tenderness, no rigidity.   Musculoskeletal: Normal gait and station of head and neck.     Complexity of Data:  Source Of History:  Patient, Medical Record Summary  Lab Test Review:   CMP  Records Review:   Previous Hospital Records, Previous Patient Records  Urine Test Review:   Urinalysis  X-Ray Review: Chest X-Ray Outside.: Reviewed Report. Discussed With Patient.  C.T. Abdomen/Pelvis: Reviewed Films. Reviewed Report.  MRI Abdomen: Reviewed Report.     09/29/19 08/13/19  General Chemistry  Sodium  142 mEq/L  Potassium  4.5 mEq/L  BUN  21 mg/dL  Creatinine  0.9 mg/dL  Chloride  99 mEq/L  CO2  33 mEq/L  Glucose  132 mg/dL  Calcium  10.5  mg/dL  Protein, Total  8.2 g/dL  Albumin  4.8 g/dL  Bilirubin, Total  0.9 mg/dL  Alkaline Phosphatase  94 IU/L  ALT  18 IU/L  AST  17 IU/L  eGFR African American  107.2   eGFR Non-Afr. American  92.5   BUN/Creatinine Ratio  23.3 Ratio  Urinalysis  Urine Appearance Clear    Urine Color Yellow    Urine Glucose Neg mg/dL   Urine Bilirubin Neg mg/dL   Urine Ketones Neg mg/dL   Urine Specific Gravity 1.025    Urine Blood Neg ery/uL   Urine pH 5.5    Urine Protein Trace mg/dL   Urine Urobilinogen 0.2 mg/dL   Urine Nitrites Neg    Urine  Leukocyte Esterase Neg leu/uL   Notes:                     CLINICAL DATA: Prior CT demonstrating a left renal mass.   EXAM:  MRI ABDOMEN WITHOUT AND WITH CONTRAST   TECHNIQUE:  Multiplanar multisequence MR imaging of the abdomen was performed  both before and after the administration of intravenous contrast.   CONTRAST: 47m MULTIHANCE GADOBENATE DIMEGLUMINE 529 MG/ML IV SOLN   COMPARISON: Abdominopelvic CT 08/08/2019. 05/02/2009 CT report  only.   FINDINGS:  Lower chest: Normal heart size. No pleural fluid.   Hepatobiliary: Normal liver. Normal gallbladder, without biliary  ductal dilatation.   Pancreas: Normal, without mass or ductal dilatation.   Spleen: Normal in size, without focal abnormality.   Adrenals/Urinary Tract: Left adrenal nodule measures 3.8 cm and  demonstrates signal dropout on out of phase imaging, consistent with  an adenoma. Normal right adrenal gland. Lower pole left renal cyst.  Normal right kidney for age.   Corresponding to the CT abnormality, within the posteromedial upper  pole left kidney is a 6.7 x 6.1 x 6.0 cm heterogeneously enhancing  mass, consistent with renal cell carcinoma. Example 37/16, 15/9,  13/7. No hydronephrosis.   Stomach/Bowel: Normal stomach and abdominal bowel loops.   Vascular/Lymphatic: Aortic atherosclerosis. Patent renal veins.  Single renal arteries. No retroperitoneal or retrocrural adenopathy.   Other: No ascites.   Musculoskeletal: Subtle 7 mm hyperenhancing focus within the  inferior portion of the T12 vertebral body on 17/16 is favored to be  related to discogenic edema.   IMPRESSION:  1. Upper pole left renal enhancing mass, consistent with renal cell  carcinoma. No evidence of left renal vein involvement or abdominal  metastatic disease.  2. Left adrenal adenoma.  3. Aortic Atherosclerosis (ICD10-I70.0).    Electronically Signed  By: KAbigail MiyamotoM.D.  On: 09/27/2019 16:44   CLINICAL DATA: Flank pain.    EXAM:  CT ABDOMEN AND PELVIS WITHOUT CONTRAST   TECHNIQUE:  Multidetector CT imaging of the abdomen and pelvis was performed  following the standard protocol without IV contrast.   COMPARISON: Abdomen and pelvis CT report dated 05/02/2009.   FINDINGS:  Lower chest: Unremarkable.   Hepatobiliary: Mildly increased density in the dependent portion of  the gallbladder. No calcified gallstones seen. No gallbladder wall  thickening or pericholecystic fluid. Unremarkable liver.   Pancreas: Unremarkable. No pancreatic ductal dilatation or  surrounding inflammatory changes.   Spleen: Normal in size without focal abnormality.   Adrenals/Urinary Tract: Previously demonstrated 3.6 x 2.9 cm low  density left adrenal mass currently measures 4.4 x 3.5 cm and 6  Hounsfield units in density on image number 24 series 2. Normal  appearing right adrenal gland.  There is a rounded, heterogeneous, medium to low density mass in the  posterior aspect of the upper pole of the left kidney, measuring 6.3  x 6.2 cm and 11 Hounsfield units in density on image number 37  series 2.   There is an oval, circumscribed, exophytic low density mass arising  from the medial aspect of the lower pole of the left kidney. This  measures 5.8 x 4.7 cm in 1 Hounsfield units in density on image  number 50 series 2.   The urinary bladder is not distended. Tiny calculus in the lower  pole of each kidney. No bladder or ureteral calculi and no  hydronephrosis seen.   Stomach/Bowel: Stomach is within normal limits. Appendix appears  normal. No evidence of bowel wall thickening, distention, or  inflammatory changes.   Vascular/Lymphatic: Minimal atheromatous arterial calcifications. No  enlarged lymph nodes.   Reproductive: Normal sized prostate gland containing coarse central  calcification.   Other: Small right and small to moderate-sized left inguinal hernias  containing fat. Small umbilical hernia containing  fat.   Musculoskeletal: Lumbar and lower thoracic spine degenerative  changes.   IMPRESSION:  1. 6.3 x 6.2 cm heterogeneous, medium to low density mass in the  posterior aspect of the upper pole of the left kidney, suspicious  for a solid mass. Further evaluation with a pre and postcontrast  magnetic resonance imaging of the kidneys is recommended.  2. 5.8 cm lower pole left renal cyst.  3. Tiny, nonobstructing calculus in the lower pole of each kidney.  4. Previously demonstrated left adrenal adenoma.    Electronically Signed  By: Claudie Revering M.D.  On: 08/08/2019 12:33   PROCEDURES:          Urinalysis Dipstick Dipstick Cont'd  Color: Yellow Bilirubin: Neg mg/dL  Appearance: Clear Ketones: Neg mg/dL  Specific Gravity: 1.025 Blood: Neg ery/uL  pH: 5.5 Protein: Trace mg/dL  Glucose: Neg mg/dL Urobilinogen: 0.2 mg/dL    Nitrites: Neg    Leukocyte Esterase: Neg leu/uL    ASSESSMENT:      ICD-10 Details  1 GU:   Other LUT calculus - N21.8 Left, Chronic, Stable  2   Left renal neoplasm - D49.512 Left, Chronic, Stable   PLAN:   -I reviewed imaging results and films with the patient personally. We discussed that the mass in question has features concerning for malignancy. I explained the natural history of presumed renal cell carcinoma. I reviewed the AUA guidelines for evaluation and treatment of renal masses. The options of active surveillance, in situ tumor ablation, partial and radical nephrectomy was discussed. The risks of robot-assisted laparoscopic LEFT radical nephrectomy were discussed in detail including but not limited to: negative pathology, open conversion, infection of the skin/abdominal cavity, VTE, MI/CVA, lymphatic leak, injury to adjacent solid/hollow viscus organs, bleeding requiring a blood transfusion, catastrophic bleeding, hernia formation and other imponderables. The patient voices understanding and wishes to proceed.  - -The risks, benefits and alternatives of  cystolithalopaxy and possible TURP was discussed with the patient. The risks included, but are not limited to, bleeding, urinary tract infection, bladder perforation requiring prolonged catheterization and/or open bladder repair, ureteral injury, ureteral obstruction, urethral stricture disease, new or worsening voiding dysfunction, retrograde ejaculation, MI, CVA, PE, DVT and the inherent risks of general anesthesia. We also discussed the need for Foley catheterization post-op. The patient voices understanding and wishes to proceed.

## 2019-10-06 NOTE — Op Note (Signed)
Operative Note  Preoperative diagnosis:  1.  2 cm bladder stone 2.  6.7 cm left renal mass  Postoperative diagnosis: 1.  2 cm bladder stone 2.  6.7 cm left renal mass  Procedure(s): 1.  Cystoscopy with cystolithalopaxy 2.  Robot assisted laparoscopic left radical nephrectomy  Surgeon: Ellison Hughs, MD  Assistants:  Celene Squibb, MD PGY-4  Anesthesia:  General  Complications:  None  EBL:  300 mL  Specimens: 1.  Bladder stone 2.  Left kidney and adrenal gland  Drains/Catheters: 1.  16 French Foley catheter  Intraoperative findings:   1. 2 cm bladder stone 2. Non-obstructed prostatic urethra  Indication:  Bradley Rodriguez is a 59 y.o. male with a 6.7 cm solid and enhancing left renal mass with feature concerning for renal cell carcinoma and a 2.0 cm bladder stone  Description of procedure:  After informed consent was obtained, the patient was brought to the operating room and general endotracheal anesthesia was administered.  The patient was placed in the dorsal lithotomy position and prepped and draped in the usual sterile fashion.  A timeout was performed.  A 26 French rigid cystoscope with a continuous flow sheath was the inserted into the urethral meatus and into the bladder.  A complete inspection of the bladder revealed a 2 cm bladder stone with no other intravesical pathology.  A 1000 watt holium laser was then used to fracture the bladder stone into numerous smaller pieces that were then siphoned out of the bladder through the cystoscope sheath.  The cystoscope was then removed and a 16 French Foley catheter was inserted with return of clear irrigant.   The patient was then placed in the right lateral decubitus position and prepped and draped in usual sterile fashion.  A timeout was performed.  An 8 mm incision was then made lateral to the left rectus muscle at the level of the left 12th rib.  Abdominal access was obtained via a Veress needle.  The abdominal  cavity was then insufflated up to 15 mmHg.  An 8 mm port was then introduced into the abdominal cavity.  Inspection of the port entry site by the robotic camera revealed no adjacent organ injury.  We then placed 3 additional 8 mm robotic ports to triangulate the left renal hilum.  A 12 mm assistant port was then placed between the carmera port and 3rd robotic arm.  The white line of [place]t along the descending colon was incised sharply and the colon, along with its mesocolonic fat, was reflected medially until the aorta was identified.  We then made a small window adjacent to the lower pole of the left kidney, identifying the left psoas muscle, left ureter and left gonadal vein.  The left ureter and gonadal vein were then reflected anteriorly allowing [name] to then incised the perihilar attachments using electrocautery.  We encountered a small lumbar vein adjacent to the insertion of the left gonadal vein into the left renal vein.  This lumbar vein was ligated with hemo-lock clips in 2 places and incised sharply.  This provided [name] excellent exposure to the left renal hilum.    A 45 mm endovascular stapler was then used to ligate the left renal artery and then the left renal vein, achieving excellent hemostasis.  The remaining peri-renal attachments were then excised using accommodation of blunt dissection and electrocautery.  The left adrenal gland was spared.  The endovascular stapler was then used to ligate the left gonadal vein and left ureter.  Once the kidney was freely mobile, it was placed in Endo Catch bag to be be retrieved at the conclusion of the case.  The robot was then de-docked.  A left lower quadrant Gibson incision was then made and the mass was removed within the Endo Catch bag.  The fascia within the midline assistant port was then closed using an interrupted 0 Vicryl suture.  The fascia of the internal and external oblique was then closed using a 0 PDS suture in a running fashion.  The  subcutaneous tissue within the [facility] incision was then closed using a running 0 Vicryl suture.  All skin incisions were then closed using 4-0 Monocryl and then dressed with Dermabond.  The patient tolerated the procedure well and was transferred to the postanesthesia in stable condition.    Plan:  Monitor on the floor overnight.

## 2019-10-07 ENCOUNTER — Encounter (HOSPITAL_COMMUNITY): Payer: Self-pay | Admitting: Urology

## 2019-10-07 LAB — BASIC METABOLIC PANEL
Anion gap: 11 (ref 5–15)
BUN: 15 mg/dL (ref 6–20)
CO2: 25 mmol/L (ref 22–32)
Calcium: 8.7 mg/dL — ABNORMAL LOW (ref 8.9–10.3)
Chloride: 99 mmol/L (ref 98–111)
Creatinine, Ser: 1.26 mg/dL — ABNORMAL HIGH (ref 0.61–1.24)
GFR calc Af Amer: >60 mL/min (ref >60)
GFR calc non Af Amer: >60 mL/min (ref >60)
Glucose, Bld: 183 mg/dL — ABNORMAL HIGH (ref 70–99)
Potassium: 4.2 mmol/L (ref 3.5–5.1)
Sodium: 135 mmol/L (ref 135–145)

## 2019-10-07 LAB — GLUCOSE, CAPILLARY: Glucose-Capillary: 172 mg/dL — ABNORMAL HIGH (ref 70–99)

## 2019-10-07 LAB — HEMOGLOBIN AND HEMATOCRIT, BLOOD
HCT: 31.3 % — ABNORMAL LOW (ref 39.0–52.0)
Hemoglobin: 9.8 g/dL — ABNORMAL LOW (ref 13.0–17.0)

## 2019-10-07 MED ORDER — DOCUSATE SODIUM 100 MG PO CAPS: 100.0000 mg | ORAL_CAPSULE | Freq: Two times a day (BID) | ORAL | 0 refills | Status: AC

## 2019-10-07 MED ORDER — ASPIRIN EC 81 MG PO TBEC
81.0000 mg | DELAYED_RELEASE_TABLET | Freq: Every day | ORAL | 11 refills | Status: DC
Start: 2019-10-08 — End: 2020-08-01

## 2019-10-07 MED ORDER — SENNA 8.6 MG PO TABS: 1.0000 | ORAL_TABLET | Freq: Every day | ORAL | 0 refills | Status: AC

## 2019-10-07 MED ORDER — HYDROCODONE-ACETAMINOPHEN 5-325 MG PO TABS
1.0000 | ORAL_TABLET | ORAL | 0 refills | Status: DC | PRN
Start: 2019-10-07 — End: 2020-08-01

## 2019-10-07 MED ORDER — POLYETHYLENE GLYCOL 3350 17 G PO PACK
17.0000 g | PACK | Freq: Every day | ORAL | Status: DC
  Administered 2019-10-07: 17 g via ORAL
  Filled 2019-10-07: qty 1

## 2019-10-07 MED FILL — HYDROCODON-APAP 5-325: 5-325 | 3 days supply | Qty: 20 | Fill #0

## 2019-10-07 NOTE — Discharge Summary (Signed)
Date of admission: 10/06/2019  Date of discharge: 10/07/2019  Admission diagnosis: Bladder stone, left renal mass  Discharge diagnosis: Bladder stone, left renal mass   Secondary diagnoses: None  History and Physical: For full details, please see admission history and physical. Briefly, Bradley Rodriguez is a 59 y.o. patient with a 6.7 cm solid and enhancing left renal mass with feature concerning for renal cell carcinoma and a 2.0 cm bladder stone.   Hospital Course:  Patient underwent cystolitholapaxy and robotic radical left nephrectomy with Dr. Lovena Neighbours on 10/06/19. He tolerated the procedure well and was transferred to the floor after receiving routine post-operative care. He was kept on bedrest overnight on POD0.   His diet was gradually advanced to regular. His pain was controlled with analgesics. He ambulated on POD1 without issues. His foley catheter was removed on POD1, and he was able to void spontaneously.  By later on POD1, he was tolerating a regular diet without nausea/emesis, passing flatus, ambulating without difficulty, voiding spontaneously, and having pain controlled with oral analgesics. He was deemed appropriate for discharge home. He was instructed to resume his baby aspirin on POD2.  Follow up for post-op check and review of pathology on 10/19/19.   Laboratory values: Recent Labs    10/06/19 1518 10/07/19 0438  HGB 10.2* 9.8*  HCT 33.1* 31.3*   Recent Labs    10/07/19 0438  CREATININE 1.26*    Disposition: Home  Discharge instruction: The patient was instructed to be ambulatory but told to refrain from heavy lifting, strenuous activity, or driving while on narcotics .  Discharge medications:  Allergies as of 10/07/2019      Reactions   Ace Inhibitors Cough   Lisinopril Cough   Xarelto [rivaroxaban] Other (See Comments)   Heart out of rhythm    Quinine Derivatives Rash      Medication List    TAKE these medications   amLODipine 5 MG tablet Commonly  known as: NORVASC Take 1 tablet (5 mg total) by mouth daily. What changed: when to take this   aspirin EC 81 MG tablet Take 1 tablet (81 mg total) by mouth daily. Start taking on: October 08, 2019   atorvastatin 20 MG tablet Commonly known as: LIPITOR Take 20 mg by mouth every evening.   Blood Sugar Balance Tabs Take 1 tablet by mouth in the morning and at bedtime. Glucocil   CITRACAL + D PO Take 1 tablet by mouth daily.   D3-1000 25 MCG (1000 UT) tablet Generic drug: Cholecalciferol Take 1,000 Units by mouth daily.   docusate sodium 100 MG capsule Commonly known as: Colace Take 1 capsule (100 mg total) by mouth 2 (two) times daily for 7 days. Take while taking narcotics   ESTER C PO Take 1,000 mg by mouth daily.   HYDROcodone-acetaminophen 5-325 MG tablet Commonly known as: Norco Take 1 tablet by mouth every 4 (four) hours as needed for moderate pain.   iron polysaccharides 150 MG capsule Commonly known as: NIFEREX Take 150 mg by mouth 2 (two) times daily.   losartan 100 MG tablet Commonly known as: COZAAR Take 100 mg by mouth every evening.   metoprolol tartrate 100 MG tablet Commonly known as: LOPRESSOR TAKE 1 TABLET BY MOUTH TWICE A DAY   OZEMPIC (0.25 OR 0.5 MG/DOSE) [place] Inject 0.5 mg into the skin every Monday.   senna 8.6 MG Tabs tablet Commonly known as: SENOKOT Take 1 tablet (8.6 mg total) by mouth daily for 7 days. Take while  taking narcotics   tamsulosin 0.4 MG Caps capsule Commonly known as: FLOMAX Take 0.4 mg by mouth daily.   TURMERIC CURCUMIN PO Take 2 capsules by mouth daily.   Vitamin D (Ergocalciferol) 1.25 MG (50000 UNIT) Caps capsule Commonly known as: DRISDOL Take 50,000 Units by mouth every Wednesday.   zinc gluconate 50 MG tablet Take 50 mg by mouth daily.       Followup:   Follow-up Information    Ceasar Mons, MD On 10/19/2019.   Specialty: Urology Why: Post-op appointment at 9:00 AM Contact  information: [address] [place] [address] [phone]

## 2019-10-07 NOTE — Progress Notes (Signed)
Foley Catheter removed by nursing student. Pt is drinking plenty of water. Pt walk twice in the hallway. No urine output yet. Will continue to monitor.

## 2019-10-07 NOTE — Discharge Instructions (Signed)
1- Drain Sites - You may have some mild persistent drainage from old drain site for several days, this is normal. This can be covered with cotton gauze for convenience.  2 - Stiches - Your stitches are all dissolvable. You may notice a "loose thread" at your incisions, these are normal and require no intervention. You may cut them flush to the skin with fingernail clippers if needed for comfort.  3 - Diet - No restrictions  4 - Activity - No heavy lifting / straining (any activities that require valsalva or "bearing down") x 4 weeks. Otherwise, no restrictions.  5 - Bathing - You may shower immediately. Do not take a bath or get into swimming pool where incision sites are submersed in water x 4 weeks.   6 - When to Call the Doctor - Call MD for any fever >102, any acute wound problems, or any severe nausea / vomiting. You can call the [facility] [phone]) 24 hours a day 365 days a year. It will roll-over to the answering service and on-call physician after hours.  7 - Medications - Resume your baby aspirin tomorrow (10/08/19)

## 2019-10-07 NOTE — Anesthesia Postprocedure Evaluation (Signed)
Anesthesia Post Note  Patient: SUKHMAN MARTINE  Procedure(s) Performed: XI ROBOTIC ASSISTED LAPAROSCOPIC NEPHRECTOMY WITH ADRENALECTOMY (Left ) CYSTOSCOPY WITH LASER LITHOLAPAXY (N/A )     Patient location during evaluation: PACU Anesthesia Type: General Level of consciousness: sedated Pain management: pain level controlled Vital Signs Assessment: post-procedure vital signs reviewed and stable Respiratory status: spontaneous breathing and respiratory function stable Cardiovascular status: stable Postop Assessment: no apparent nausea or vomiting Anesthetic complications: no   No complications documented.             Leroy Trim DANIEL

## 2019-10-08 LAB — SURGICAL PATHOLOGY

## 2019-10-09 LAB — TYPE AND SCREEN
ABO/RH(D): O POS
Antibody Screen: NEGATIVE
Unit division: 0
Unit division: 0

## 2019-10-09 LAB — BPAM RBC
Blood Product Expiration Date: 202110082359
Blood Product Expiration Date: 202110082359
Unit Type and Rh: 5100
Unit Type and Rh: 5100

## 2019-10-19 DIAGNOSIS — C642 Malignant neoplasm of left kidney, except renal pelvis: Secondary | ICD-10-CM | POA: Diagnosis not present

## 2019-10-22 ENCOUNTER — Other Ambulatory Visit: Payer: Self-pay | Admitting: Student

## 2019-10-22 DIAGNOSIS — E1165 Type 2 diabetes mellitus with hyperglycemia: Secondary | ICD-10-CM | POA: Diagnosis not present

## 2019-10-22 DIAGNOSIS — Z7984 Long term (current) use of oral hypoglycemic drugs: Secondary | ICD-10-CM | POA: Diagnosis not present

## 2019-10-22 DIAGNOSIS — I10 Essential (primary) hypertension: Secondary | ICD-10-CM | POA: Diagnosis not present

## 2019-10-22 DIAGNOSIS — E7849 Other hyperlipidemia: Secondary | ICD-10-CM | POA: Diagnosis not present

## 2019-10-22 NOTE — Telephone Encounter (Signed)
This is a [place] pt, Dr. Branch 

## 2019-11-10 DIAGNOSIS — M79676 Pain in unspecified toe(s): Secondary | ICD-10-CM | POA: Diagnosis not present

## 2019-11-10 DIAGNOSIS — E1142 Type 2 diabetes mellitus with diabetic polyneuropathy: Secondary | ICD-10-CM | POA: Diagnosis not present

## 2019-11-10 DIAGNOSIS — B351 Tinea unguium: Secondary | ICD-10-CM | POA: Diagnosis not present

## 2019-11-10 DIAGNOSIS — L84 Corns and callosities: Secondary | ICD-10-CM | POA: Diagnosis not present

## 2019-11-18 DIAGNOSIS — C642 Malignant neoplasm of left kidney, except renal pelvis: Secondary | ICD-10-CM | POA: Diagnosis not present

## 2019-11-21 DIAGNOSIS — I10 Essential (primary) hypertension: Secondary | ICD-10-CM | POA: Diagnosis not present

## 2019-11-21 DIAGNOSIS — Z7984 Long term (current) use of oral hypoglycemic drugs: Secondary | ICD-10-CM | POA: Diagnosis not present

## 2019-11-21 DIAGNOSIS — E7849 Other hyperlipidemia: Secondary | ICD-10-CM | POA: Diagnosis not present

## 2019-11-21 DIAGNOSIS — E1165 Type 2 diabetes mellitus with hyperglycemia: Secondary | ICD-10-CM | POA: Diagnosis not present

## 2019-11-23 DIAGNOSIS — K589 Irritable bowel syndrome without diarrhea: Secondary | ICD-10-CM | POA: Diagnosis not present

## 2019-11-23 DIAGNOSIS — Z6833 Body mass index (BMI) 33.0-33.9, adult: Secondary | ICD-10-CM | POA: Diagnosis not present

## 2019-12-22 DIAGNOSIS — E1165 Type 2 diabetes mellitus with hyperglycemia: Secondary | ICD-10-CM | POA: Diagnosis not present

## 2019-12-22 DIAGNOSIS — E7849 Other hyperlipidemia: Secondary | ICD-10-CM | POA: Diagnosis not present

## 2019-12-22 DIAGNOSIS — I10 Essential (primary) hypertension: Secondary | ICD-10-CM | POA: Diagnosis not present

## 2019-12-30 ENCOUNTER — Other Ambulatory Visit (HOSPITAL_COMMUNITY): Payer: Self-pay | Admitting: Urology

## 2019-12-30 ENCOUNTER — Other Ambulatory Visit: Payer: Self-pay | Admitting: Urology

## 2019-12-30 DIAGNOSIS — C642 Malignant neoplasm of left kidney, except renal pelvis: Secondary | ICD-10-CM

## 2020-01-07 DIAGNOSIS — Z1159 Encounter for screening for other viral diseases: Secondary | ICD-10-CM | POA: Diagnosis not present

## 2020-01-07 DIAGNOSIS — R5383 Other fatigue: Secondary | ICD-10-CM | POA: Diagnosis not present

## 2020-01-07 DIAGNOSIS — Z125 Encounter for screening for malignant neoplasm of prostate: Secondary | ICD-10-CM | POA: Diagnosis not present

## 2020-01-07 DIAGNOSIS — E782 Mixed hyperlipidemia: Secondary | ICD-10-CM | POA: Diagnosis not present

## 2020-01-07 DIAGNOSIS — E1142 Type 2 diabetes mellitus with diabetic polyneuropathy: Secondary | ICD-10-CM | POA: Diagnosis not present

## 2020-01-07 DIAGNOSIS — I10 Essential (primary) hypertension: Secondary | ICD-10-CM | POA: Diagnosis not present

## 2020-01-12 DIAGNOSIS — I480 Paroxysmal atrial fibrillation: Secondary | ICD-10-CM | POA: Diagnosis not present

## 2020-01-12 DIAGNOSIS — E1142 Type 2 diabetes mellitus with diabetic polyneuropathy: Secondary | ICD-10-CM | POA: Diagnosis not present

## 2020-01-12 DIAGNOSIS — Z0001 Encounter for general adult medical examination with abnormal findings: Secondary | ICD-10-CM | POA: Diagnosis not present

## 2020-01-12 DIAGNOSIS — K580 Irritable bowel syndrome with diarrhea: Secondary | ICD-10-CM | POA: Diagnosis not present

## 2020-01-12 DIAGNOSIS — F331 Major depressive disorder, recurrent, moderate: Secondary | ICD-10-CM | POA: Diagnosis not present

## 2020-01-12 DIAGNOSIS — E8881 Metabolic syndrome: Secondary | ICD-10-CM | POA: Diagnosis not present

## 2020-01-12 DIAGNOSIS — D500 Iron deficiency anemia secondary to blood loss (chronic): Secondary | ICD-10-CM | POA: Diagnosis not present

## 2020-01-12 DIAGNOSIS — E782 Mixed hyperlipidemia: Secondary | ICD-10-CM | POA: Diagnosis not present

## 2020-01-12 DIAGNOSIS — I10 Essential (primary) hypertension: Secondary | ICD-10-CM | POA: Diagnosis not present

## 2020-01-19 DIAGNOSIS — E1142 Type 2 diabetes mellitus with diabetic polyneuropathy: Secondary | ICD-10-CM | POA: Diagnosis not present

## 2020-01-19 DIAGNOSIS — M79676 Pain in unspecified toe(s): Secondary | ICD-10-CM | POA: Diagnosis not present

## 2020-01-19 DIAGNOSIS — L84 Corns and callosities: Secondary | ICD-10-CM | POA: Diagnosis not present

## 2020-01-19 DIAGNOSIS — B351 Tinea unguium: Secondary | ICD-10-CM | POA: Diagnosis not present

## 2020-01-22 DIAGNOSIS — E7849 Other hyperlipidemia: Secondary | ICD-10-CM | POA: Diagnosis not present

## 2020-01-22 DIAGNOSIS — E1165 Type 2 diabetes mellitus with hyperglycemia: Secondary | ICD-10-CM | POA: Diagnosis not present

## 2020-01-22 DIAGNOSIS — I10 Essential (primary) hypertension: Secondary | ICD-10-CM | POA: Diagnosis not present

## 2020-01-22 DIAGNOSIS — Z7984 Long term (current) use of oral hypoglycemic drugs: Secondary | ICD-10-CM | POA: Diagnosis not present

## 2020-01-26 ENCOUNTER — Other Ambulatory Visit: Payer: Self-pay

## 2020-01-26 ENCOUNTER — Ambulatory Visit (HOSPITAL_COMMUNITY)
Admission: RE | Admit: 2020-01-26 | Discharge: 2020-01-26 | Disposition: A | Discharge status: Home / Self Care | Payer: Medicare HMO | Source: Ambulatory Visit | Attending: Urology | Admitting: Urology

## 2020-01-26 DIAGNOSIS — Z85528 Personal history of other malignant neoplasm of kidney: Secondary | ICD-10-CM | POA: Diagnosis not present

## 2020-01-26 DIAGNOSIS — C642 Malignant neoplasm of left kidney, except renal pelvis: Secondary | ICD-10-CM | POA: Insufficient documentation

## 2020-01-26 DIAGNOSIS — I700 Atherosclerosis of aorta: Secondary | ICD-10-CM | POA: Diagnosis not present

## 2020-01-26 DIAGNOSIS — Z905 Acquired absence of kidney: Secondary | ICD-10-CM | POA: Diagnosis not present

## 2020-01-26 DIAGNOSIS — E278 Other specified disorders of adrenal gland: Secondary | ICD-10-CM | POA: Diagnosis not present

## 2020-01-26 LAB — POCT I-STAT CREATININE: Creatinine, Ser: 1.3 mg/dL — ABNORMAL HIGH (ref 0.61–1.24)

## 2020-01-26 MED ORDER — IOHEXOL 300 MG/ML  SOLN
100.0000 mL | Freq: Once | INTRAMUSCULAR | Status: AC | PRN
  Administered 2020-01-26: 100 mL via INTRAVENOUS

## 2020-02-01 ENCOUNTER — Other Ambulatory Visit (HOSPITAL_COMMUNITY): Payer: Self-pay | Admitting: Urology

## 2020-02-01 ENCOUNTER — Ambulatory Visit (HOSPITAL_COMMUNITY)
Admission: RE | Admit: 2020-02-01 | Discharge: 2020-02-01 | Disposition: A | Discharge status: Home / Self Care | Payer: Medicare HMO | Source: Ambulatory Visit | Attending: Urology | Admitting: Urology

## 2020-02-01 DIAGNOSIS — Z85528 Personal history of other malignant neoplasm of kidney: Secondary | ICD-10-CM | POA: Diagnosis not present

## 2020-02-01 DIAGNOSIS — C642 Malignant neoplasm of left kidney, except renal pelvis: Secondary | ICD-10-CM | POA: Diagnosis not present

## 2020-02-20 DIAGNOSIS — E1165 Type 2 diabetes mellitus with hyperglycemia: Secondary | ICD-10-CM | POA: Diagnosis not present

## 2020-02-20 DIAGNOSIS — I480 Paroxysmal atrial fibrillation: Secondary | ICD-10-CM | POA: Diagnosis not present

## 2020-02-20 DIAGNOSIS — I10 Essential (primary) hypertension: Secondary | ICD-10-CM | POA: Diagnosis not present

## 2020-02-20 DIAGNOSIS — Z7984 Long term (current) use of oral hypoglycemic drugs: Secondary | ICD-10-CM | POA: Diagnosis not present

## 2020-02-20 DIAGNOSIS — E7849 Other hyperlipidemia: Secondary | ICD-10-CM | POA: Diagnosis not present

## 2020-02-23 DIAGNOSIS — D649 Anemia, unspecified: Secondary | ICD-10-CM | POA: Diagnosis not present

## 2020-02-23 DIAGNOSIS — R5383 Other fatigue: Secondary | ICD-10-CM | POA: Diagnosis not present

## 2020-02-23 DIAGNOSIS — D529 Folate deficiency anemia, unspecified: Secondary | ICD-10-CM | POA: Diagnosis not present

## 2020-02-23 DIAGNOSIS — D519 Vitamin B12 deficiency anemia, unspecified: Secondary | ICD-10-CM | POA: Diagnosis not present

## 2020-02-23 DIAGNOSIS — Z1321 Encounter for screening for nutritional disorder: Secondary | ICD-10-CM | POA: Diagnosis not present

## 2020-02-24 ENCOUNTER — Encounter (INDEPENDENT_AMBULATORY_CARE_PROVIDER_SITE_OTHER): Payer: Self-pay | Admitting: *Deleted

## 2020-03-21 DIAGNOSIS — E1165 Type 2 diabetes mellitus with hyperglycemia: Secondary | ICD-10-CM | POA: Diagnosis not present

## 2020-03-21 DIAGNOSIS — E7849 Other hyperlipidemia: Secondary | ICD-10-CM | POA: Diagnosis not present

## 2020-03-21 DIAGNOSIS — I10 Essential (primary) hypertension: Secondary | ICD-10-CM | POA: Diagnosis not present

## 2020-03-21 DIAGNOSIS — I480 Paroxysmal atrial fibrillation: Secondary | ICD-10-CM | POA: Diagnosis not present

## 2020-03-21 DIAGNOSIS — Z7984 Long term (current) use of oral hypoglycemic drugs: Secondary | ICD-10-CM | POA: Diagnosis not present

## 2020-04-05 DIAGNOSIS — B351 Tinea unguium: Secondary | ICD-10-CM | POA: Diagnosis not present

## 2020-04-05 DIAGNOSIS — E1142 Type 2 diabetes mellitus with diabetic polyneuropathy: Secondary | ICD-10-CM | POA: Diagnosis not present

## 2020-04-05 DIAGNOSIS — L84 Corns and callosities: Secondary | ICD-10-CM | POA: Diagnosis not present

## 2020-04-05 DIAGNOSIS — M79676 Pain in unspecified toe(s): Secondary | ICD-10-CM | POA: Diagnosis not present

## 2020-04-12 ENCOUNTER — Other Ambulatory Visit: Payer: Self-pay | Admitting: Cardiology

## 2020-04-20 DIAGNOSIS — Z7984 Long term (current) use of oral hypoglycemic drugs: Secondary | ICD-10-CM | POA: Diagnosis not present

## 2020-04-20 DIAGNOSIS — E1165 Type 2 diabetes mellitus with hyperglycemia: Secondary | ICD-10-CM | POA: Diagnosis not present

## 2020-04-20 DIAGNOSIS — E7849 Other hyperlipidemia: Secondary | ICD-10-CM | POA: Diagnosis not present

## 2020-04-20 DIAGNOSIS — I480 Paroxysmal atrial fibrillation: Secondary | ICD-10-CM | POA: Diagnosis not present

## 2020-04-20 DIAGNOSIS — I10 Essential (primary) hypertension: Secondary | ICD-10-CM | POA: Diagnosis not present

## 2020-05-12 DIAGNOSIS — K580 Irritable bowel syndrome with diarrhea: Secondary | ICD-10-CM | POA: Diagnosis not present

## 2020-05-12 DIAGNOSIS — M1000 Idiopathic gout, unspecified site: Secondary | ICD-10-CM | POA: Diagnosis not present

## 2020-05-12 DIAGNOSIS — F331 Major depressive disorder, recurrent, moderate: Secondary | ICD-10-CM | POA: Diagnosis not present

## 2020-05-12 DIAGNOSIS — I10 Essential (primary) hypertension: Secondary | ICD-10-CM | POA: Diagnosis not present

## 2020-05-12 DIAGNOSIS — D529 Folate deficiency anemia, unspecified: Secondary | ICD-10-CM | POA: Diagnosis not present

## 2020-05-12 DIAGNOSIS — I480 Paroxysmal atrial fibrillation: Secondary | ICD-10-CM | POA: Diagnosis not present

## 2020-05-12 DIAGNOSIS — E7849 Other hyperlipidemia: Secondary | ICD-10-CM | POA: Diagnosis not present

## 2020-05-12 DIAGNOSIS — D500 Iron deficiency anemia secondary to blood loss (chronic): Secondary | ICD-10-CM | POA: Diagnosis not present

## 2020-05-12 DIAGNOSIS — Z0001 Encounter for general adult medical examination with abnormal findings: Secondary | ICD-10-CM | POA: Diagnosis not present

## 2020-05-12 DIAGNOSIS — E1142 Type 2 diabetes mellitus with diabetic polyneuropathy: Secondary | ICD-10-CM | POA: Diagnosis not present

## 2020-05-12 DIAGNOSIS — E782 Mixed hyperlipidemia: Secondary | ICD-10-CM | POA: Diagnosis not present

## 2020-05-12 DIAGNOSIS — J301 Allergic rhinitis due to pollen: Secondary | ICD-10-CM | POA: Diagnosis not present

## 2020-05-12 DIAGNOSIS — M25532 Pain in left wrist: Secondary | ICD-10-CM | POA: Diagnosis not present

## 2020-05-12 DIAGNOSIS — G4733 Obstructive sleep apnea (adult) (pediatric): Secondary | ICD-10-CM | POA: Diagnosis not present

## 2020-05-12 DIAGNOSIS — D519 Vitamin B12 deficiency anemia, unspecified: Secondary | ICD-10-CM | POA: Diagnosis not present

## 2020-05-12 DIAGNOSIS — E8881 Metabolic syndrome: Secondary | ICD-10-CM | POA: Diagnosis not present

## 2020-05-13 DIAGNOSIS — I10 Essential (primary) hypertension: Secondary | ICD-10-CM | POA: Diagnosis not present

## 2020-05-13 DIAGNOSIS — D649 Anemia, unspecified: Secondary | ICD-10-CM | POA: Diagnosis not present

## 2020-05-13 DIAGNOSIS — E7849 Other hyperlipidemia: Secondary | ICD-10-CM | POA: Diagnosis not present

## 2020-05-18 DIAGNOSIS — D509 Iron deficiency anemia, unspecified: Secondary | ICD-10-CM | POA: Diagnosis not present

## 2020-05-20 DIAGNOSIS — E1165 Type 2 diabetes mellitus with hyperglycemia: Secondary | ICD-10-CM | POA: Diagnosis not present

## 2020-05-20 DIAGNOSIS — D649 Anemia, unspecified: Secondary | ICD-10-CM | POA: Diagnosis not present

## 2020-05-20 DIAGNOSIS — E8881 Metabolic syndrome: Secondary | ICD-10-CM | POA: Diagnosis not present

## 2020-05-20 DIAGNOSIS — D519 Vitamin B12 deficiency anemia, unspecified: Secondary | ICD-10-CM | POA: Diagnosis not present

## 2020-05-20 DIAGNOSIS — D529 Folate deficiency anemia, unspecified: Secondary | ICD-10-CM | POA: Diagnosis not present

## 2020-05-21 DIAGNOSIS — I480 Paroxysmal atrial fibrillation: Secondary | ICD-10-CM | POA: Diagnosis not present

## 2020-05-21 DIAGNOSIS — E1165 Type 2 diabetes mellitus with hyperglycemia: Secondary | ICD-10-CM | POA: Diagnosis not present

## 2020-05-21 DIAGNOSIS — Z7984 Long term (current) use of oral hypoglycemic drugs: Secondary | ICD-10-CM | POA: Diagnosis not present

## 2020-05-21 DIAGNOSIS — E7849 Other hyperlipidemia: Secondary | ICD-10-CM | POA: Diagnosis not present

## 2020-05-21 DIAGNOSIS — I10 Essential (primary) hypertension: Secondary | ICD-10-CM | POA: Diagnosis not present

## 2020-05-25 DIAGNOSIS — D509 Iron deficiency anemia, unspecified: Secondary | ICD-10-CM | POA: Diagnosis not present

## 2020-06-02 ENCOUNTER — Ambulatory Visit (INDEPENDENT_AMBULATORY_CARE_PROVIDER_SITE_OTHER): Payer: Medicare HMO | Admitting: Gastroenterology

## 2020-06-07 DIAGNOSIS — B351 Tinea unguium: Secondary | ICD-10-CM | POA: Diagnosis not present

## 2020-06-07 DIAGNOSIS — M79676 Pain in unspecified toe(s): Secondary | ICD-10-CM | POA: Diagnosis not present

## 2020-06-07 DIAGNOSIS — L84 Corns and callosities: Secondary | ICD-10-CM | POA: Diagnosis not present

## 2020-06-07 DIAGNOSIS — E1142 Type 2 diabetes mellitus with diabetic polyneuropathy: Secondary | ICD-10-CM | POA: Diagnosis not present

## 2020-06-20 DIAGNOSIS — Z7984 Long term (current) use of oral hypoglycemic drugs: Secondary | ICD-10-CM | POA: Diagnosis not present

## 2020-06-20 DIAGNOSIS — I480 Paroxysmal atrial fibrillation: Secondary | ICD-10-CM | POA: Diagnosis not present

## 2020-06-20 DIAGNOSIS — E1165 Type 2 diabetes mellitus with hyperglycemia: Secondary | ICD-10-CM | POA: Diagnosis not present

## 2020-06-20 DIAGNOSIS — E7849 Other hyperlipidemia: Secondary | ICD-10-CM | POA: Diagnosis not present

## 2020-06-20 DIAGNOSIS — I10 Essential (primary) hypertension: Secondary | ICD-10-CM | POA: Diagnosis not present

## 2020-07-21 DIAGNOSIS — I480 Paroxysmal atrial fibrillation: Secondary | ICD-10-CM | POA: Diagnosis not present

## 2020-07-21 DIAGNOSIS — I10 Essential (primary) hypertension: Secondary | ICD-10-CM | POA: Diagnosis not present

## 2020-07-21 DIAGNOSIS — Z7984 Long term (current) use of oral hypoglycemic drugs: Secondary | ICD-10-CM | POA: Diagnosis not present

## 2020-07-21 DIAGNOSIS — E7849 Other hyperlipidemia: Secondary | ICD-10-CM | POA: Diagnosis not present

## 2020-07-21 DIAGNOSIS — E1165 Type 2 diabetes mellitus with hyperglycemia: Secondary | ICD-10-CM | POA: Diagnosis not present

## 2020-08-01 ENCOUNTER — Ambulatory Visit (INDEPENDENT_AMBULATORY_CARE_PROVIDER_SITE_OTHER): Payer: Medicare HMO | Admitting: Gastroenterology

## 2020-08-01 ENCOUNTER — Encounter (INDEPENDENT_AMBULATORY_CARE_PROVIDER_SITE_OTHER): Payer: Self-pay | Admitting: Gastroenterology

## 2020-08-01 ENCOUNTER — Other Ambulatory Visit: Payer: Self-pay

## 2020-08-01 ENCOUNTER — Other Ambulatory Visit (INDEPENDENT_AMBULATORY_CARE_PROVIDER_SITE_OTHER): Payer: Self-pay

## 2020-08-01 ENCOUNTER — Encounter (INDEPENDENT_AMBULATORY_CARE_PROVIDER_SITE_OTHER): Payer: Self-pay

## 2020-08-01 ENCOUNTER — Telehealth (INDEPENDENT_AMBULATORY_CARE_PROVIDER_SITE_OTHER): Payer: Self-pay

## 2020-08-01 DIAGNOSIS — D509 Iron deficiency anemia, unspecified: Secondary | ICD-10-CM | POA: Diagnosis not present

## 2020-08-01 MED ORDER — SUPREP BOWEL PREP KIT 17.5-3.13-1.6 GM/177ML PO SOLN: 1.0000 | ORAL | 0 refills | Status: DC

## 2020-08-01 NOTE — Progress Notes (Signed)
Bradley Rodriguez, M.D. Gastroenterology & Hepatology [facility] [address] [place], [place] [address] Primary Care Physician: Angiulli, Day G, MD [address] [place] [address]  Referring MD: PCP  Chief Complaint: Iron deficiency anemia  History of Present Illness: Bradley Rodriguez is a 60 y.o. male with Pmh afib,DM, RCC s/p resection, CKD, HLD,  HTN, IBS, who presents for evaluation of IDA.  Patient reports that close to 2.5 years he was told he had anemia.  Since then, he has been taking oral iron 2 tablets every day.  His hemoglobin has fluctuated but has not required a blood transfusion in the past.  He states that close to 2 months ago he received an IV iron infusion as his Hb was in the lower side. He reports that he felt tired and fatigued prior to receiving the IV iron , but this improved after receiving the IV iron.  The patient denies having any nausea, vomiting, fever, chills, hematochezia, melena, hematemesis, abdominal distention, abdominal pain, diarrhea, jaundice, pruritus or weight loss.  Very rarely takes any NSAIDs, but this is not frequent. He is not on any anticoagulant.  I reviewed the most recent blood test that the patient had performed at his PCPs office.  Labs from 01/07/2020 showed a TSH of 1.8.  Labs from 02/23/2020 showed TIBC of 333 normal, iron of 45, iron saturation of 14% slightly decreased, ferritin 12 decreased, hemoglobin of 10.1, white blood cell count 5.9, MCV 85, platelets 327.  He had repeat blood testing on 04/02/2019 which showed TIBC of 386, iron of 93, iron saturation 24%, ferritin 17 decreased, vitamin B12 301, folate 14.2, hemoglobin of 11.8 with MCV 90, white blood cell count 5.8, platelets 292.  Last EGD:never Last Colonoscopy:possibly 2 years ago by Dr. Demason, had some polyps removed but no report is available.  FHx: neg for any gastrointestinal/liver disease, father and mother colon cancer in  their 70s Social: neg smoking, alcohol or illicit drug use Surgical: L nephrectomy due to RCC  Past Medical History: Past Medical History:  Diagnosis Date   A-fib (HCC)    DCCV 2009, 2012, 2014, Nov 2016   Anxiety    Arthritis    Knees, wrist, ankles   Cancer (HCC) 07/2019   Kidney   Diabetes mellitus without complication (HCC)    Dysrhythmia 1996   A- Fib   History of kidney stones    HX: anticoagulation    very remotely and briefly on COumadin aound one of his CV, 2014 on Eliquis had hematuria with renal calculi and stopped   Hyperlipidemia    patient denies this   Hypertension    IBS (irritable bowel syndrome)    Normal cardiac stress test    Normal nuclear stress test , 2001    Past Surgical History: Past Surgical History:  Procedure Laterality Date   CARDIOVERSION N/A 10/15/2012   Procedure: CARDIOVERSION;  Surgeon: Dalton S McLean, MD;  [facility];  Service: Cardiovascular;  Laterality: N/A;   CARDIOVERSION N/A 11/26/2014   Procedure: CARDIOVERSION;  Surgeon: Jonathan F Branch, MD;  Location: [facility];  Service: Endoscopy;  Laterality: N/A;   CARDIOVERSION N/A 11/15/2015   Procedure: CARDIOVERSION;  Surgeon: Mark C Skains, MD;  [facility];  Service: Cardiovascular;  Laterality: N/A;   CARDIOVERSION N/A 08/07/2016   Procedure: CARDIOVERSION;  Surgeon: Koneswaran, Suresh A, MD;  Location: [facility];  Service: Cardiovascular;  Laterality: N/A;   CARDIOVERSION N/A 09/11/2018     Procedure: CARDIOVERSION;  Surgeon: Branch, Jonathan F, MD;  Location: [facility];  Service: Endoscopy;  Laterality: N/A;   CATARACT EXTRACTION W/PHACO Right 01/18/2014   Procedure: CATARACT EXTRACTION PHACO AND INTRAOCULAR LENS PLACEMENT (IOC);  Surgeon: Kerry Hunt, MD;  Location: [facility];  Service: Ophthalmology;  Laterality: Right;  CDE 9.95   CYSTOSCOPY WITH LITHOLAPAXY N/A 10/06/2019   Procedure: CYSTOSCOPY WITH LASER LITHOLAPAXY;  Surgeon: Winter, Christopher Aaron, MD;  [facility]: [facility];  Service: Urology;  Laterality: N/A;   EYE SURGERY Right    detached retina   FOOT SURGERY Left    KNEE ARTHROSCOPY Right    LITHOTRIPSY     for kidney stones 2002   ROBOT ASSISTED LAPAROSCOPIC NEPHRECTOMY Left 10/06/2019   Procedure: XI ROBOTIC ASSISTED LAPAROSCOPIC NEPHRECTOMY WITH ADRENALECTOMY;  Surgeon: Winter, Christopher Aaron, MD;  [facility];  Service: Urology;  Laterality: Left;   TEE WITHOUT CARDIOVERSION N/A 11/26/2014   Procedure: TRANSESOPHAGEAL ECHOCARDIOGRAM (TEE) WITH PROPOFOL;  Surgeon: Jonathan F Branch, MD;  [facility];  Service: Endoscopy;  Laterality: N/A;   TEE WITHOUT CARDIOVERSION N/A 11/15/2015   Procedure: TRANSESOPHAGEAL ECHOCARDIOGRAM (TEE);  Surgeon: Mark C Skains, MD;  [facility];  Service: Cardiovascular;  Laterality: N/A;   TEE WITHOUT CARDIOVERSION N/A 09/11/2018   Procedure: TRANSESOPHAGEAL ECHOCARDIOGRAM (TEE) WITH PROPOFOL;  Surgeon: Branch, Jonathan F, MD;  [facility];  Service: Endoscopy;  Laterality: N/A;   VASECTOMY      Family History: Family History  Problem Relation Age of Onset   Hypertension Other    Diabetes Mother    Hypertension Mother    Diabetes Father     Social History: Social History   Tobacco Use  Smoking Status Never  Smokeless Tobacco Never   Social History   Substance and Sexual Activity  Alcohol Use No   Alcohol/week: 0.0 standard drinks   Social History   Substance and Sexual Activity  Drug Use No    Allergies: Allergies  Allergen Reactions   Ace Inhibitors Cough   Lisinopril Cough   Xarelto [Rivaroxaban] Other (See Comments)    Heart out of rhythm    Quinine Derivatives Rash    Medications: Current Outpatient Medications  Medication Sig Dispense Refill   amLODipine (NORVASC) 5 MG tablet TAKE 1 TABLET BY MOUTH EVERY DAY 90 tablet 3   atorvastatin (LIPITOR) 20 MG tablet Take 20 mg by mouth every evening.      Bioflavonoid Products (ESTER C PO) Take 1,000 mg by mouth  daily.      Calcium Citrate-Vitamin D (CITRACAL + D PO) Take 1 tablet by mouth daily.      iron polysaccharides (NIFEREX) 150 MG capsule Take 150 mg by mouth 2 (two) times daily.     losartan (COZAAR) 100 MG tablet Take 100 mg by mouth every evening.      metoprolol tartrate (LOPRESSOR) 100 MG tablet TAKE 1 TABLET BY MOUTH TWICE A DAY (Patient taking differently: Take 50 mg by mouth 2 (two) times daily.) 60 tablet 2   Misc Natural Products (BLOOD SUGAR BALANCE) TABS Take 1 tablet by mouth in the morning and at bedtime. Glucocil     Na Sulfate-K Sulfate-Mg Sulf (SUPREP BOWEL PREP KIT) 17.5-3.13-1.6 GM/177ML SOLN Take 1 kit by mouth as directed. 354 mL 0   Semaglutide (OZEMPIC, 0.25 OR 0.5 MG/DOSE, [place]) Inject 0.5 mg into the skin every Monday.      Vitamin D, Ergocalciferol, (DRISDOL) 50000 UNITS CAPS capsule Take 50,000 Units by mouth   every Wednesday.      zinc gluconate 50 MG tablet Take 50 mg by mouth daily.     No current facility-administered medications for this visit.    Review of Systems: GENERAL: negative for malaise, night sweats HEENT: No changes in hearing or vision, no nose bleeds or other nasal problems. NECK: Negative for lumps, goiter, pain and significant neck swelling RESPIRATORY: Negative for cough, wheezing CARDIOVASCULAR: Negative for chest pain, leg swelling, palpitations, orthopnea GI: SEE HPI MUSCULOSKELETAL: Negative for joint pain or swelling, back pain, and muscle pain. SKIN: Negative for lesions, rash PSYCH: Negative for sleep disturbance, mood disorder and recent psychosocial stressors. HEMATOLOGY Negative for prolonged bleeding, bruising easily, and swollen nodes. ENDOCRINE: Negative for cold or heat intolerance, polyuria, polydipsia and goiter. NEURO: negative for tremor, gait imbalance, syncope and seizures. The remainder of the review of systems is noncontributory.   Physical Exam: BP 134/68 (BP Location: Right Arm, Patient Position: Sitting, Cuff Size:  Large)   Pulse 67   Temp 98.1 F (36.7 C) (Oral)   Ht 6' 8" (2.032 m)   Wt (!) 307 lb 4.8 oz (139.4 kg)   BMI 33.76 kg/m  GENERAL: The patient is AO x3, in no acute distress. HEENT: Head is normocephalic and atraumatic. EOMI are intact. Mouth is well hydrated and without lesions. NECK: Supple. No masses LUNGS: Clear to auscultation. No presence of rhonchi/wheezing/rales. Adequate chest expansion HEART: RRR, normal s1 and s2. ABDOMEN: Soft, nontender, no guarding, no peritoneal signs, and nondistended. BS +. No masses. EXTREMITIES: Without any cyanosis, clubbing, rash, lesions or edema. NEUROLOGIC: AOx3, no focal motor deficit. SKIN: no jaundice, no rashes   Imaging/Labs: as above  I personally reviewed and interpreted the available labs, imaging and endoscopic files.  Impression and Plan: Bradley Rodriguez is a 60 y.o. male with Pmh afib,DM, RCC s/p resection, CKD, HLD,  HTN, IBS, who presents for evaluation of IDA.  The patient has presented iron deficiency anemia of unclear etiology which will has required IV iron infusion in the past but no blood transfusion yet.  We will investigate this further with an EGD and a colonoscopy.  I advised the patient to continue taking his oral iron as he is currently taking.  I have thorough discussion with him regarding the possible need to perform a capsule endoscopy to evaluate his anemia if the endoscopic investigations are negative, especially to rule out other rare etiologies such as lymphoma/GIST that can cause symptomatic iron deficiency anemia.  The patient understood and agreed.  - Schedule EGD and colonoscopy - Continue oral iron  All questions were answered.      Gennette Shadix Castaneda, MD Gastroenterology and Hepatology [facility]  

## 2020-08-01 NOTE — H&P (View-Only) (Signed)
Maylon Peppers, M.D. Gastroenterology & Hepatology [facility] [address] [place], [place] [address] Primary Care Physician: Loa Socks, MD [place] [phone]  Referring MD: PCP  Chief Complaint: Iron deficiency anemia  History of Present Illness: Bradley Rodriguez is a 60 y.o. male with Pmh afib,DM, RCC s/p resection, CKD, HLD,  HTN, IBS, who presents for evaluation of IDA.  Patient reports that close to 2.5 years he was told he had anemia.  Since then, he has been taking oral iron 2 tablets every day.  His hemoglobin has fluctuated but has not required a blood transfusion in the past.  He states that close to 2 months ago he received an IV iron infusion as his Hb was in the lower side. He reports that he felt tired and fatigued prior to receiving the IV iron , but this improved after receiving the IV iron.  The patient denies having any nausea, vomiting, fever, chills, hematochezia, melena, hematemesis, abdominal distention, abdominal pain, diarrhea, jaundice, pruritus or weight loss.  Very rarely takes any NSAIDs, but this is not frequent. He is not on any anticoagulant.  I reviewed the most recent blood test that the patient had performed at his PCPs office.  Labs from 01/07/2020 showed a TSH of 1.8.  Labs from 02/23/2020 showed TIBC of 333 normal, iron of 45, iron saturation of 14% slightly decreased, ferritin 12 decreased, hemoglobin of 10.1, white blood cell count 5.9, MCV 85, platelets 327.  He had repeat blood testing on 04/02/2019 which showed TIBC of 386, iron of 93, iron saturation 24%, ferritin 17 decreased, vitamin B12 301, folate 14.2, hemoglobin of 11.8 with MCV 90, white blood cell count 5.8, platelets 292.  Last HWK:GSUPJ Last Colonoscopy:possibly 2 years ago by Dr. Anthony Sar, had some polyps removed but no report is available.  FHx: neg for any gastrointestinal/liver disease, father and mother colon cancer in  their 43s Social: neg smoking, alcohol or illicit drug use Surgical: L nephrectomy due to [facility]  Past Medical History: Past Medical History:  Diagnosis Date   A-fib [facility])    DCCV 2009, 2012, 2014, Nov 2016   Anxiety    Arthritis    Knees, wrist, ankles   Cancer ([place]) 07/2019   Kidney   Diabetes mellitus without complication ([place])    Dysrhythmia 1996   A- Fib   History of kidney stones    HX: anticoagulation    very remotely and briefly on COumadin aound one of his CV, 2014 on Eliquis had hematuria with renal calculi and stopped   Hyperlipidemia    patient denies this   Hypertension    IBS (irritable bowel syndrome)    Normal cardiac stress test    Normal nuclear stress test , 2001    Past Surgical History: Past Surgical History:  Procedure Laterality Date   CARDIOVERSION N/A 10/15/2012   Procedure: CARDIOVERSION;  Surgeon: Larey Dresser, MD;  Location: [place];  Service: Cardiovascular;  Laterality: N/A;   CARDIOVERSION N/A 11/26/2014   Procedure: CARDIOVERSION;  Surgeon: Arnoldo Lenis, MD;  [facility];  Service: Endoscopy;  Laterality: N/A;   CARDIOVERSION N/A 11/15/2015   Procedure: CARDIOVERSION;  Surgeon: Jerline Pain, MD;  Location: [place];  Service: Cardiovascular;  Laterality: N/A;   CARDIOVERSION N/A 08/07/2016   Procedure: CARDIOVERSION;  Surgeon: Herminio Commons, MD;  [facility];  Service: Cardiovascular;  Laterality: N/A;   CARDIOVERSION N/A 09/11/2018  Procedure: CARDIOVERSION;  Surgeon: Arnoldo Lenis, MD;  [facility];  Service: Endoscopy;  Laterality: N/A;   CATARACT EXTRACTION W/PHACO Right 01/18/2014   Procedure: CATARACT EXTRACTION PHACO AND INTRAOCULAR LENS PLACEMENT (IOC);  Surgeon: Tonny Branch, MD;  Location: [facility];  Service: Ophthalmology;  Laterality: Right;  CDE 9.95   CYSTOSCOPY WITH LITHOLAPAXY N/A 10/06/2019   Procedure: CYSTOSCOPY WITH LASER LITHOLAPAXY;  Surgeon: Ceasar Mons, MD;  [facility];  Service: Urology;  Laterality: N/A;   EYE SURGERY Right    detached retina   FOOT SURGERY Left    KNEE ARTHROSCOPY Right    LITHOTRIPSY     for kidney stones 2002   ROBOT ASSISTED LAPAROSCOPIC NEPHRECTOMY Left 10/06/2019   Procedure: XI ROBOTIC ASSISTED LAPAROSCOPIC NEPHRECTOMY WITH ADRENALECTOMY;  Surgeon: Ceasar Mons, MD;  [facility];  Service: Urology;  Laterality: Left;   TEE WITHOUT CARDIOVERSION N/A 11/26/2014   Procedure: TRANSESOPHAGEAL ECHOCARDIOGRAM (TEE) WITH PROPOFOL;  Surgeon: Arnoldo Lenis, MD;  [facility];  Service: Endoscopy;  Laterality: N/A;   TEE WITHOUT CARDIOVERSION N/A 11/15/2015   Procedure: TRANSESOPHAGEAL ECHOCARDIOGRAM (TEE);  Surgeon: Jerline Pain, MD;  Location: [place];  Service: Cardiovascular;  Laterality: N/A;   TEE WITHOUT CARDIOVERSION N/A 09/11/2018   Procedure: TRANSESOPHAGEAL ECHOCARDIOGRAM (TEE) WITH PROPOFOL;  Surgeon: Arnoldo Lenis, MD;  [facility];  Service: Endoscopy;  Laterality: N/A;   VASECTOMY      Family History: Family History  Problem Relation Age of Onset   Hypertension Other    Diabetes Mother    Hypertension Mother    Diabetes Father     Social History: Social History   Tobacco Use  Smoking Status Never  Smokeless Tobacco Never   Social History   Substance and Sexual Activity  Alcohol Use No   Alcohol/week: 0.0 standard drinks   Social History   Substance and Sexual Activity  Drug Use No    Allergies: Allergies  Allergen Reactions   Ace Inhibitors Cough   Lisinopril Cough   Xarelto [Rivaroxaban] Other (See Comments)    Heart out of rhythm    Quinine Derivatives Rash    Medications: Current Outpatient Medications  Medication Sig Dispense Refill   amLODipine (NORVASC) 5 MG tablet TAKE 1 TABLET BY MOUTH EVERY DAY 90 tablet 3   atorvastatin (LIPITOR) 20 MG tablet Take 20 mg by mouth every evening.      Bioflavonoid Products (ESTER C PO) Take 1,000 mg by mouth  daily.      Calcium Citrate-Vitamin D (CITRACAL + D PO) Take 1 tablet by mouth daily.      iron polysaccharides (NIFEREX) 150 MG capsule Take 150 mg by mouth 2 (two) times daily.     losartan (COZAAR) 100 MG tablet Take 100 mg by mouth every evening.      metoprolol tartrate (LOPRESSOR) 100 MG tablet TAKE 1 TABLET BY MOUTH TWICE A DAY (Patient taking differently: Take 50 mg by mouth 2 (two) times daily.) 60 tablet 2   Misc Natural Products (BLOOD SUGAR BALANCE) TABS Take 1 tablet by mouth in the morning and at bedtime. Glucocil     Na Sulfate-K Sulfate-Mg Sulf (SUPREP BOWEL PREP KIT) 17.5-3.13-1.6 GM/177ML SOLN Take 1 kit by mouth as directed. 354 mL 0   Semaglutide (OZEMPIC, 0.25 OR 0.5 MG/DOSE, [place]) Inject 0.5 mg into the skin every Monday.      Vitamin D, Ergocalciferol, (DRISDOL) 50000 UNITS CAPS capsule Take 50,000 Units by mouth  every Wednesday.      zinc gluconate 50 MG tablet Take 50 mg by mouth daily.     No current facility-administered medications for this visit.    Review of Systems: GENERAL: negative for malaise, night sweats HEENT: No changes in hearing or vision, no nose bleeds or other nasal problems. NECK: Negative for lumps, goiter, pain and significant neck swelling RESPIRATORY: Negative for cough, wheezing CARDIOVASCULAR: Negative for chest pain, leg swelling, palpitations, orthopnea GI: SEE HPI MUSCULOSKELETAL: Negative for joint pain or swelling, back pain, and muscle pain. SKIN: Negative for lesions, rash PSYCH: Negative for sleep disturbance, mood disorder and recent psychosocial stressors. HEMATOLOGY Negative for prolonged bleeding, bruising easily, and swollen nodes. ENDOCRINE: Negative for cold or heat intolerance, polyuria, polydipsia and goiter. NEURO: negative for tremor, gait imbalance, syncope and seizures. The remainder of the review of systems is noncontributory.   Physical Exam: BP 134/68 (BP Location: Right Arm, Patient Position: Sitting, Cuff Size:  Large)   Pulse 67   Temp 98.1 F (36.7 C) (Oral)   Ht _0  (2.032 m)   Wt (!) 307 lb 4.8 oz (139.4 kg)   BMI 33.76 kg/m  GENERAL: The patient is AO x3, in no acute distress. HEENT: Head is normocephalic and atraumatic. EOMI are intact. Mouth is well hydrated and without lesions. NECK: Supple. No masses LUNGS: Clear to auscultation. No presence of rhonchi/wheezing/rales. Adequate chest expansion HEART: RRR, normal s1 and s2. ABDOMEN: Soft, nontender, no guarding, no peritoneal signs, and nondistended. BS +. No masses. EXTREMITIES: Without any cyanosis, clubbing, rash, lesions or edema. NEUROLOGIC: AOx3, no focal motor deficit. SKIN: no jaundice, no rashes   Imaging/Labs: as above  I personally reviewed and interpreted the available labs, imaging and endoscopic files.  Impression and Plan: Bradley Rodriguez is a 60 y.o. male with Pmh afib,DM, RCC s/p resection, CKD, HLD,  HTN, IBS, who presents for evaluation of IDA.  The patient has presented iron deficiency anemia of unclear etiology which will has required IV iron infusion in the past but no blood transfusion yet.  We will investigate this further with an EGD and a colonoscopy.  I advised the patient to continue taking his oral iron as he is currently taking.  I have thorough discussion with him regarding the possible need to perform a capsule endoscopy to evaluate his anemia if the endoscopic investigations are negative, especially to rule out other rare etiologies such as lymphoma/GIST that can cause symptomatic iron deficiency anemia.  The patient understood and agreed.  - Schedule EGD and colonoscopy - Continue oral iron  All questions were answered.      Maylon Peppers, MD Gastroenterology and Hepatology [facility]

## 2020-08-01 NOTE — Telephone Encounter (Signed)
LeighAnn Wilberto Console, CMA  

## 2020-08-01 NOTE — Patient Instructions (Addendum)
Schedule EGD and colonoscopy ?Continue oral iron ?

## 2020-08-04 ENCOUNTER — Encounter (INDEPENDENT_AMBULATORY_CARE_PROVIDER_SITE_OTHER): Payer: Self-pay

## 2020-08-16 DIAGNOSIS — E1142 Type 2 diabetes mellitus with diabetic polyneuropathy: Secondary | ICD-10-CM | POA: Diagnosis not present

## 2020-08-16 DIAGNOSIS — M79676 Pain in unspecified toe(s): Secondary | ICD-10-CM | POA: Diagnosis not present

## 2020-08-16 DIAGNOSIS — L84 Corns and callosities: Secondary | ICD-10-CM | POA: Diagnosis not present

## 2020-08-16 DIAGNOSIS — B351 Tinea unguium: Secondary | ICD-10-CM | POA: Diagnosis not present

## 2020-08-19 ENCOUNTER — Ambulatory Visit (HOSPITAL_COMMUNITY)
Admission: RE | Admit: 2020-08-19 | Discharge: 2020-08-19 | Disposition: A | Discharge status: Home / Self Care | Payer: Medicare HMO | Attending: Gastroenterology | Admitting: Gastroenterology

## 2020-08-19 ENCOUNTER — Encounter (HOSPITAL_COMMUNITY): Payer: Self-pay | Admitting: Gastroenterology

## 2020-08-19 ENCOUNTER — Other Ambulatory Visit: Payer: Self-pay

## 2020-08-19 ENCOUNTER — Ambulatory Visit (HOSPITAL_COMMUNITY): Payer: Medicare HMO | Admitting: Anesthesiology

## 2020-08-19 ENCOUNTER — Encounter (HOSPITAL_COMMUNITY)
Admission: RE | Disposition: A | Discharge status: Home / Self Care | Payer: Self-pay | Source: Home / Self Care | Attending: Gastroenterology

## 2020-08-19 ENCOUNTER — Other Ambulatory Visit (INDEPENDENT_AMBULATORY_CARE_PROVIDER_SITE_OTHER): Payer: Self-pay | Admitting: Gastroenterology

## 2020-08-19 DIAGNOSIS — D120 Benign neoplasm of cecum: Secondary | ICD-10-CM

## 2020-08-19 DIAGNOSIS — D124 Benign neoplasm of descending colon: Secondary | ICD-10-CM | POA: Diagnosis not present

## 2020-08-19 DIAGNOSIS — K317 Polyp of stomach and duodenum: Secondary | ICD-10-CM | POA: Diagnosis not present

## 2020-08-19 DIAGNOSIS — K2950 Unspecified chronic gastritis without bleeding: Secondary | ICD-10-CM | POA: Diagnosis not present

## 2020-08-19 DIAGNOSIS — Z833 Family history of diabetes mellitus: Secondary | ICD-10-CM | POA: Diagnosis not present

## 2020-08-19 DIAGNOSIS — E119 Type 2 diabetes mellitus without complications: Secondary | ICD-10-CM | POA: Diagnosis not present

## 2020-08-19 DIAGNOSIS — C182 Malignant neoplasm of ascending colon: Secondary | ICD-10-CM | POA: Insufficient documentation

## 2020-08-19 DIAGNOSIS — I129 Hypertensive chronic kidney disease with stage 1 through stage 4 chronic kidney disease, or unspecified chronic kidney disease: Secondary | ICD-10-CM | POA: Insufficient documentation

## 2020-08-19 DIAGNOSIS — K5669 Other partial intestinal obstruction: Secondary | ICD-10-CM | POA: Diagnosis not present

## 2020-08-19 DIAGNOSIS — K5730 Diverticulosis of large intestine without perforation or abscess without bleeding: Secondary | ICD-10-CM | POA: Diagnosis not present

## 2020-08-19 DIAGNOSIS — C186 Malignant neoplasm of descending colon: Secondary | ICD-10-CM | POA: Diagnosis not present

## 2020-08-19 DIAGNOSIS — D123 Benign neoplasm of transverse colon: Secondary | ICD-10-CM | POA: Diagnosis not present

## 2020-08-19 DIAGNOSIS — N189 Chronic kidney disease, unspecified: Secondary | ICD-10-CM | POA: Diagnosis not present

## 2020-08-19 DIAGNOSIS — Z888 Allergy status to other drugs, medicaments and biological substances status: Secondary | ICD-10-CM | POA: Diagnosis not present

## 2020-08-19 DIAGNOSIS — Z79899 Other long term (current) drug therapy: Secondary | ICD-10-CM | POA: Diagnosis not present

## 2020-08-19 DIAGNOSIS — C184 Malignant neoplasm of transverse colon: Secondary | ICD-10-CM | POA: Insufficient documentation

## 2020-08-19 DIAGNOSIS — D122 Benign neoplasm of ascending colon: Secondary | ICD-10-CM | POA: Insufficient documentation

## 2020-08-19 DIAGNOSIS — K648 Other hemorrhoids: Secondary | ICD-10-CM | POA: Diagnosis not present

## 2020-08-19 DIAGNOSIS — E1122 Type 2 diabetes mellitus with diabetic chronic kidney disease: Secondary | ICD-10-CM | POA: Diagnosis not present

## 2020-08-19 DIAGNOSIS — C188 Malignant neoplasm of overlapping sites of colon: Secondary | ICD-10-CM | POA: Diagnosis not present

## 2020-08-19 DIAGNOSIS — D125 Benign neoplasm of sigmoid colon: Secondary | ICD-10-CM

## 2020-08-19 DIAGNOSIS — Z8249 Family history of ischemic heart disease and other diseases of the circulatory system: Secondary | ICD-10-CM | POA: Diagnosis not present

## 2020-08-19 DIAGNOSIS — D509 Iron deficiency anemia, unspecified: Secondary | ICD-10-CM | POA: Diagnosis not present

## 2020-08-19 HISTORY — PX: POLYPECTOMY: SHX149

## 2020-08-19 HISTORY — PX: SUBMUCOSAL TATTOO INJECTION: SHX6856

## 2020-08-19 HISTORY — PX: POLYPECTOMY: SHX5525

## 2020-08-19 HISTORY — PX: COLONOSCOPY WITH PROPOFOL: SHX5780

## 2020-08-19 HISTORY — PX: BIOPSY: SHX5522

## 2020-08-19 HISTORY — PX: ESOPHAGOGASTRODUODENOSCOPY (EGD) WITH PROPOFOL: SHX5813

## 2020-08-19 LAB — GLUCOSE, CAPILLARY: Glucose-Capillary: 118 mg/dL — ABNORMAL HIGH (ref 70–99)

## 2020-08-19 SURGERY — COLONOSCOPY WITH PROPOFOL
Anesthesia: General

## 2020-08-19 MED ORDER — OMEPRAZOLE 40 MG PO CPDR: 40.0000 mg | DELAYED_RELEASE_CAPSULE | Freq: Two times a day (BID) | ORAL | 0 refills | Status: DC

## 2020-08-19 MED ORDER — PHENYLEPHRINE 40 MCG/ML (10ML) SYRINGE FOR IV PUSH (FOR BLOOD PRESSURE SUPPORT)
PREFILLED_SYRINGE | INTRAVENOUS | Status: AC
  Filled 2020-08-19: qty 10

## 2020-08-19 MED ORDER — LACTATED RINGERS IV SOLN: INTRAVENOUS | Status: DC

## 2020-08-19 MED ORDER — PROPOFOL 500 MG/50ML IV EMUL
INTRAVENOUS | Status: DC | PRN
  Administered 2020-08-19: 150 ug/kg/min via INTRAVENOUS

## 2020-08-19 MED ORDER — PHENYLEPHRINE HCL (PRESSORS) 10 MG/ML IV SOLN
INTRAVENOUS | Status: DC | PRN
  Administered 2020-08-19 (×3): 80 ug via INTRAVENOUS

## 2020-08-19 MED ORDER — EPHEDRINE 5 MG/ML INJ
INTRAVENOUS | Status: AC
  Filled 2020-08-19: qty 5

## 2020-08-19 MED ORDER — PROPOFOL 10 MG/ML IV BOLUS
INTRAVENOUS | Status: DC | PRN
  Administered 2020-08-19 (×6): 50 mg via INTRAVENOUS
  Administered 2020-08-19: 100 mg via INTRAVENOUS

## 2020-08-19 MED ORDER — SPOT INK MARKER SYRINGE KIT
PACK | SUBMUCOSAL | Status: DC | PRN
  Administered 2020-08-19: 1 mL via SUBMUCOSAL

## 2020-08-19 MED ORDER — EPHEDRINE SULFATE 50 MG/ML IJ SOLN
INTRAMUSCULAR | Status: DC | PRN
  Administered 2020-08-19: 10 mg via INTRAVENOUS

## 2020-08-19 MED ORDER — STERILE WATER FOR IRRIGATION IR SOLN
Status: DC | PRN
  Administered 2020-08-19: 100 mL
  Administered 2020-08-19: 200 mL

## 2020-08-19 MED ORDER — SPOT INK MARKER SYRINGE KIT
PACK | SUBMUCOSAL | Status: AC
  Filled 2020-08-19: qty 5

## 2020-08-19 NOTE — Progress Notes (Signed)
Hi Darius Bump,   Can you please schedule a CT chest, abdomen pelvis with IV contrast? Dx: colon cancer staging.   Thanks,  Maylon Peppers, MD Gastroenterology and Hepatology [facility]

## 2020-08-19 NOTE — Anesthesia Postprocedure Evaluation (Signed)
Anesthesia Post Note  Patient: Bradley Rodriguez  Procedure(s) Performed: COLONOSCOPY WITH PROPOFOL ESOPHAGOGASTRODUODENOSCOPY (EGD) WITH PROPOFOL BIOPSY POLYPECTOMY INTESTINAL POLYPECTOMY SUBMUCOSAL TATTOO INJECTION  Patient location during evaluation: Phase II Anesthesia Type: General Level of consciousness: awake Pain management: pain level controlled Vital Signs Assessment: post-procedure vital signs reviewed and stable Respiratory status: spontaneous breathing and respiratory function stable Cardiovascular status: blood pressure returned to baseline and stable Postop Assessment: no headache and no apparent nausea or vomiting Anesthetic complications: no Comments: Late entry   No notable events documented.   Last Vitals:  Vitals:   08/19/20 0713 08/19/20 1000  BP: 131/73 108/69  Pulse: 66 62  Resp: 13 14  Temp: 36.5 C 36.5 C  SpO2: 98% 98%    Last Pain:  Vitals:   08/19/20 1000  TempSrc: Oral  PainSc: 0-No pain                 Louann Sjogren

## 2020-08-19 NOTE — Op Note (Signed)
[facility] Patient Name: Bradley Rodriguez Procedure Date: 08/19/2020 8:30 AM MRN: [id number] Date of Birth: 04/07/1960 Attending MD: Maylon Peppers ,  CSN: [id number] Age: 60 Admit Type: Outpatient Procedure:                Upper GI endoscopy Indications:              Iron deficiency anemia Providers:                Maylon Peppers, Crystal Page, [place] Risa Grill, Technician Referring MD:              Medicines:                Monitored Anesthesia Care Complications:            No immediate complications. Estimated Blood Loss:     Estimated blood loss: none. Procedure:                Pre-Anesthesia Assessment:                           - Prior to the procedure, a History and Physical                            was performed, and patient medications, allergies                            and sensitivities were reviewed. The patient's                            tolerance of previous anesthesia was reviewed.                           - The risks and benefits of the procedure and the                            sedation options and risks were discussed with the                            patient. All questions were answered and informed                            consent was obtained.                           - ASA Grade Assessment: III - A patient with severe                            systemic disease.                           After obtaining informed consent, the endoscope was                            passed under direct vision. Throughout the  procedure, the patient's blood pressure, pulse, and                            oxygen saturations were monitored continuously. The                            [id number] scope was introduced through the                            mouth, and advanced to the second part of duodenum.                            The upper GI endoscopy was accomplished without                             difficulty. The patient tolerated the procedure                            well. Scope In: 8:44:03 AM Scope Out: [id number] AM Total Procedure Duration: 0 hours 12 minutes 43 seconds  Findings:      The examined esophagus was normal.      A single 6 mm sessile polyp with no bleeding was found in the gastric       antrum. The polyp was removed with a cold snare. Resection and retrieval       were complete.      The examined duodenum was normal. Biopsies were taken with a cold       forceps for histology. Impression:               - Normal esophagus.                           - A single gastric polyp. Resected and retrieved.                           - Normal examined duodenum. Biopsied. Moderate Sedation:      Per Anesthesia Care Recommendation:           - Discharge patient to home (ambulatory).                           - Resume previous diet.                           - Await pathology results.                           - Use Prilosec (omeprazole) 40 mg PO BID for 4                            weeks. Procedure Code(s):        --- Professional ---                           [phone], Esophagogastroduodenoscopy, flexible,  transoral; with removal of tumor(s), polyp(s), or                            other lesion(s) by snare technique                           43239, 59, Esophagogastroduodenoscopy, flexible,                            transoral; with biopsy, single or multiple Diagnosis Code(s):        --- Professional ---                           K31.7, Polyp of stomach and duodenum                           D50.9, Iron deficiency anemia, unspecified CPT copyright 2019 American Medical Association. All rights reserved. The codes documented in this report are preliminary and upon coder review may  be revised to meet current compliance requirements. Maylon Peppers, MD Maylon Peppers,  08/19/2020 9:49:02 AM This report has been signed  electronically. Number of Addenda: 0

## 2020-08-19 NOTE — Discharge Instructions (Addendum)
You are being discharged to home.  Resume your previous diet.  We are waiting for your pathology results.  Take Prilosec (omeprazole) 40 mg by mouth twice a day for four weeks.  We are waiting for your pathology results.  Schedule CT chest, abdomen and pelvis with IV contrast Referral to oncology and surgeon.   Message left for Darius Bump at Dr. Sanjuana Letters office of need for scheduling.  Office will call.

## 2020-08-19 NOTE — Transfer of Care (Signed)
Immediate Anesthesia Transfer of Care Note  Patient: Bradley Rodriguez  Procedure(s) Performed: COLONOSCOPY WITH PROPOFOL ESOPHAGOGASTRODUODENOSCOPY (EGD) WITH PROPOFOL BIOPSY POLYPECTOMY INTESTINAL POLYPECTOMY SUBMUCOSAL TATTOO INJECTION  Patient Location: Endoscopy Unit  Anesthesia Type:General  Level of Consciousness: drowsy  Airway & Oxygen Therapy: Patient Spontanous Breathing  Post-op Assessment: Report given to RN and Post -op Vital signs reviewed and stable  Post vital signs: Reviewed and stable  Last Vitals:  Vitals Value Taken Time  BP    Temp    Pulse 60 08/19/20  0952  Resp    SpO2 96 08/19/20  0952    Last Pain:  Vitals:   08/19/20 0837  TempSrc:   PainSc: 0-No pain      Patients Stated Pain Goal: 8 ([id number])  Complications: No notable events documented.

## 2020-08-19 NOTE — Op Note (Signed)
[facility] Patient Name: Bradley Rodriguez Procedure Date: 08/19/2020 8:59 AM MRN: [id number] Date of Birth: [birth year] Attending MD: Maylon Peppers ,  CSN: [id number] Age: 60 Admit Type: Outpatient Procedure:                Colonoscopy Indications:              Iron deficiency anemia Providers:                Maylon Peppers, Crystal Page, [place] Risa Grill, Technician Referring MD:              Medicines:                Monitored Anesthesia Care Complications:            No immediate complications. Estimated Blood Loss:     Estimated blood loss: none. Procedure:                Pre-Anesthesia Assessment:                           - Prior to the procedure, a History and Physical                            was performed, and patient medications, allergies                            and sensitivities were reviewed. The patient's                            tolerance of previous anesthesia was reviewed.                           - The risks and benefits of the procedure and the                            sedation options and risks were discussed with the                            patient. All questions were answered and informed                            consent was obtained.                           - ASA Grade Assessment: III - A patient with severe                            systemic disease.                           After obtaining informed consent, the colonoscope                            was passed under direct vision. Throughout the  procedure, the patient's blood pressure, pulse, and                            oxygen saturations were monitored continuously. The                            [id number] scope was introduced through                            the anus and advanced to the the cecum, identified                            by appendiceal orifice and ileocecal valve. The                             colonoscopy was performed with difficulty due to                            significant looping. The patient tolerated the                            procedure well. The quality of the bowel                            preparation was good. Scope In: 9:02:13 AM Scope Out: 9:45:04 AM Scope Withdrawal Time: 0 hours 24 minutes 15 seconds  Total Procedure Duration: 0 hours 42 minutes 51 seconds  Findings:      The perianal and digital rectal examinations were normal.      A fungating, infiltrative and ulcerated partially obstructing large mass       was found in the proximal ascending colon. The mass was partially       circumferential (involving two-thirds of the lumen circumference). The       mass measured three cm in length. Oozing was present. This was biopsied       with a cold forceps for histology. An area 1cm distal to the mass was       successfully injected with 1 mL of a 1:10,000 solution of epinephrine       for tattooing.      Seven sessile polyps were found in the transverse colon, ascending colon       and cecum. The polyps were 4 to 10 mm in size. These polyps were removed       with a cold snare. Resection and retrieval were complete.      A 2 mm polyp was found in the transverse colon. The polyp was sessile.       The polyp was removed with a cold biopsy forceps. Resection and       retrieval were complete.      A 6 mm polyp was found in the descending colon. The polyp was sessile.       The polyp was removed with a cold snare. Resection and retrieval were       complete.      A 12 mm polyp was found in the sigmoid colon. The polyp was sessile. The       polyp was removed with a hot snare. Resection  and retrieval were       complete.      A few small-mouthed diverticula were found in the sigmoid colon.      Non-bleeding internal hemorrhoids were found during retroflexion. 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 Biopsied. Injected.                           - Seven 4 to 10 mm polyps in the transverse colon,                            in the ascending colon and in the cecum, removed                            with a cold snare. Resected and retrieved.                           - One 2 mm polyp in the transverse colon, removed                            with a cold biopsy forceps. Resected and retrieved.                           - One 6 mm polyp in the descending colon, removed                            with a cold snare. Resected and retrieved.                           - One 12 mm polyp in the sigmoid colon, removed                            with a hot snare. Resected and retrieved.                           - Diverticulosis in the sigmoid colon.                           - Non-bleeding internal hemorrhoids. Moderate Sedation:      Per Anesthesia Care Recommendation:           - Discharge patient to home (ambulatory).                           - Resume previous diet.                           - Schedule CT chest, abdomen and pelvis with IV                            contrast                           -  Referral to oncology and surgeon.                           - Check CEA                           - Await pathology results. Procedure Code(s):        --- Professional ---                           [phone], Colonoscopy, flexible; with removal of                            tumor(s), polyp(s), or other lesion(s) by snare                            technique                           45381, Colonoscopy, flexible; with directed                            submucosal injection(s), any substance                           [id number], 11, Colonoscopy, flexible; with biopsy,                            single or multiple Diagnosis Code(s):        --- Professional ---                           C18.2, Malignant neoplasm of ascending colon                           K56.690, Other partial  intestinal obstruction                           K63.5, Polyp of colon                           K64.8, Other hemorrhoids                           D50.9, Iron deficiency anemia, unspecified                           K57.30, Diverticulosis of large intestine without                            perforation or abscess without bleeding CPT copyright 2019 American Medical Association. All rights reserved. The codes documented in this report are preliminary and upon coder review may  be revised to meet current compliance requirements. Maylon Peppers, MD Maylon Peppers,  08/19/2020 9:57:25 AM This report has been signed electronically. Number of Addenda: 0

## 2020-08-19 NOTE — Anesthesia Preprocedure Evaluation (Signed)
Anesthesia Evaluation  Patient identified by MRN, date of birth, ID band Patient awake    Reviewed: Allergy & Precautions, H&P , NPO status , Patient's Chart, lab work & pertinent test results, reviewed documented beta blocker date and time   Airway Mallampati: II  TM Distance: >3 FB Neck ROM: full    Dental no notable dental hx.    Pulmonary neg pulmonary ROS,    Pulmonary exam normal breath sounds clear to auscultation       Cardiovascular Exercise Tolerance: Good hypertension, negative cardio ROS   Rhythm:regular Rate:Normal     Neuro/Psych PSYCHIATRIC DISORDERS Anxiety negative neurological ROS     GI/Hepatic negative GI ROS, Neg liver ROS,   Endo/Other  negative endocrine ROSdiabetes  Renal/GU negative Renal ROS  negative genitourinary   Musculoskeletal   Abdominal   Peds  Hematology  (+) Blood dyscrasia, anemia ,   Anesthesia Other Findings   Reproductive/Obstetrics negative OB ROS                             Anesthesia Physical Anesthesia Plan  ASA: 2  Anesthesia Plan: General   Post-op Pain Management:    Induction:   PONV Risk Score and Plan: Propofol infusion  Airway Management Planned:   Additional Equipment:   Intra-op Plan:   Post-operative Plan:   Informed Consent: I have reviewed the patients History and Physical, chart, labs and discussed the procedure including the risks, benefits and alternatives for the proposed anesthesia with the patient or authorized representative who has indicated his/her understanding and acceptance.     Dental Advisory Given  Plan Discussed with: CRNA  Anesthesia Plan Comments:         Anesthesia Quick Evaluation

## 2020-08-19 NOTE — Interval H&P Note (Signed)
History and Physical Interval Note:  08/19/2020 7:23 AM Bradley Rodriguez is a 60 y.o. male with Pmh afib,DM, RCC s/p resection, CKD, HLD,  HTN, IBS, who presents for evaluation of IDA.  Patient denies having any complaints.  He states that he has not present any melena, hematochezia, mental pain, abdominal distention, nausea, vomiting, fever, chills.  BP 131/73   Pulse 66   Temp 97.7 F (36.5 C) (Oral)   Resp 13   Ht '6\' 8"'$  (2.032 m)   Wt 135.6 kg   SpO2 98%   BMI 32.85 kg/m  GENERAL: The patient is AO x3, in no acute distress. HEENT: Head is normocephalic and atraumatic. EOMI are intact. Mouth is well hydrated and without lesions. NECK: Supple. No masses LUNGS: Clear to auscultation. No presence of rhonchi/wheezing/rales. Adequate chest expansion HEART: RRR, normal s1 and s2. ABDOMEN: Soft, nontender, no guarding, no peritoneal signs, and nondistended. BS +. No masses. EXTREMITIES: Without any cyanosis, clubbing, rash, lesions or edema. NEUROLOGIC: AOx3, no focal motor deficit. SKIN: no jaundice, no rashes  Bradley Rodriguez  has presented today for surgery, with the diagnosis of Iron Deficiency Anemia.  The various methods of treatment have been discussed with the patient and family. After consideration of risks, benefits and other options for treatment, the patient has consented to  Procedure(s) with comments: COLONOSCOPY WITH PROPOFOL (N/A) - 8:20 ESOPHAGOGASTRODUODENOSCOPY (EGD) WITH PROPOFOL (N/A) as a surgical intervention.  The patient's history has been reviewed, patient examined, no change in status, stable for surgery.  I have reviewed the patient's chart and labs.  Questions were answered to the patient's satisfaction.     Maylon Peppers [place]

## 2020-08-20 LAB — CEA: CEA: 0.7 ng/mL (ref 0.0–4.7)

## 2020-08-21 DIAGNOSIS — I10 Essential (primary) hypertension: Secondary | ICD-10-CM | POA: Diagnosis not present

## 2020-08-21 DIAGNOSIS — I480 Paroxysmal atrial fibrillation: Secondary | ICD-10-CM | POA: Diagnosis not present

## 2020-08-21 DIAGNOSIS — E1165 Type 2 diabetes mellitus with hyperglycemia: Secondary | ICD-10-CM | POA: Diagnosis not present

## 2020-08-21 DIAGNOSIS — Z7984 Long term (current) use of oral hypoglycemic drugs: Secondary | ICD-10-CM | POA: Diagnosis not present

## 2020-08-21 DIAGNOSIS — E7849 Other hyperlipidemia: Secondary | ICD-10-CM | POA: Diagnosis not present

## 2020-08-22 ENCOUNTER — Telehealth (INDEPENDENT_AMBULATORY_CARE_PROVIDER_SITE_OTHER): Payer: Self-pay | Admitting: *Deleted

## 2020-08-22 ENCOUNTER — Other Ambulatory Visit (INDEPENDENT_AMBULATORY_CARE_PROVIDER_SITE_OTHER): Payer: Self-pay

## 2020-08-22 DIAGNOSIS — I10 Essential (primary) hypertension: Secondary | ICD-10-CM

## 2020-08-22 DIAGNOSIS — E1110 Type 2 diabetes mellitus with ketoacidosis without coma: Secondary | ICD-10-CM

## 2020-08-22 NOTE — Telephone Encounter (Signed)
Per op note - patient needs CT A/P & Chest w/ contrast

## 2020-08-23 ENCOUNTER — Encounter (HOSPITAL_COMMUNITY): Payer: Self-pay | Admitting: Gastroenterology

## 2020-08-25 ENCOUNTER — Other Ambulatory Visit: Payer: Self-pay

## 2020-08-25 ENCOUNTER — Other Ambulatory Visit (HOSPITAL_COMMUNITY)
Admission: RE | Admit: 2020-08-25 | Discharge: 2020-08-25 | Disposition: A | Discharge status: Home / Self Care | Payer: Medicare HMO | Source: Ambulatory Visit | Attending: Gastroenterology | Admitting: Gastroenterology

## 2020-08-25 DIAGNOSIS — I10 Essential (primary) hypertension: Secondary | ICD-10-CM | POA: Insufficient documentation

## 2020-08-25 DIAGNOSIS — E1110 Type 2 diabetes mellitus with ketoacidosis without coma: Secondary | ICD-10-CM | POA: Diagnosis not present

## 2020-08-25 LAB — CREATININE, SERUM
Creatinine, Ser: 1.29 mg/dL — ABNORMAL HIGH (ref 0.61–1.24)
GFR, Estimated: >60 mL/min (ref >60)

## 2020-08-25 LAB — BUN: BUN: 18 mg/dL (ref 8–23)

## 2020-08-26 ENCOUNTER — Ambulatory Visit (HOSPITAL_BASED_OUTPATIENT_CLINIC_OR_DEPARTMENT_OTHER)
Admission: RE | Admit: 2020-08-26 | Discharge: 2020-08-26 | Disposition: A | Discharge status: Home / Self Care | Payer: Medicare HMO | Source: Ambulatory Visit | Attending: Gastroenterology | Admitting: Gastroenterology

## 2020-08-26 DIAGNOSIS — C183 Malignant neoplasm of hepatic flexure: Secondary | ICD-10-CM | POA: Diagnosis not present

## 2020-08-26 DIAGNOSIS — C182 Malignant neoplasm of ascending colon: Secondary | ICD-10-CM | POA: Insufficient documentation

## 2020-08-26 DIAGNOSIS — I700 Atherosclerosis of aorta: Secondary | ICD-10-CM | POA: Diagnosis not present

## 2020-08-26 MED ORDER — IOHEXOL 300 MG/ML  SOLN
100.0000 mL | Freq: Once | INTRAMUSCULAR | Status: AC | PRN
  Administered 2020-08-26: 100 mL via INTRAVENOUS

## 2020-08-29 DIAGNOSIS — C182 Malignant neoplasm of ascending colon: Secondary | ICD-10-CM | POA: Diagnosis not present

## 2020-08-31 ENCOUNTER — Telehealth: Payer: Self-pay | Admitting: Cardiology

## 2020-08-31 NOTE — Telephone Encounter (Signed)
   [facility] Pre-operative Risk Assessment    Patient Name: ARISTIDE WAGGLE  DOB: 02/22/1960 MRN: [id number]  HEARTCARE STAFF:  - IMPORTANT!!!!!! Under Visit Info/Reason for Call, type in Other and utilize the format Clearance MM/DD/YY or Clearance TBD. Do not use dashes or single digits. - Please review there is not already an duplicate clearance open for this procedure. - If request is for dental extraction, please clarify the # of teeth to be extracted. - If the patient is currently at the dentist's office, call Pre-Op Callback Staff (MA/nurse) to input urgent request.  - If the patient is not currently in the dentist office, please route to the Pre-Op pool.  Request for surgical clearance:  What type of surgery is being performed?  Colon Surgery  When is this surgery scheduled?  TBD  What type of clearance is required (medical clearance vs. Pharmacy clearance to hold med vs. Both)?  Medical- will require written clearance in order to get a surgery date   Are there any medications that need to be held prior to surgery and how long?  Does not state on faxed clearance request   Practice name and name of physician performing surgery?  [facility] - does not give name of physician performing surgery  What is the office phone number?  [phone]   7.   What is the office fax number?              1.   [phone] ATTN: Mammie Lorenzo, LPN   8.   Anesthesia type (None, local, MAC, general) ?          1. General Anesthesia     Desma Paganini 08/31/2020, 4:23 PM  _________________________________________________________________   (provider comments below)

## 2020-08-31 NOTE — Telephone Encounter (Signed)
Patient already has an appt scheduled with Levell July, NP on 09/15/20 at 3:00 PM with pre-op clearance in the appt notes. Will route to requesting surgeons office to make them aware.

## 2020-08-31 NOTE — Telephone Encounter (Signed)
Primary Cardiologist:Branch, Roderic Palau, MD  Chart reviewed as part of pre-operative protocol coverage. Because of Bradley Rodriguez past medical history and time since last visit, he/she will require a follow-up visit in order to better assess preoperative cardiovascular risk.  Pre-op covering staff: - Please schedule appointment and call patient to inform them. - Please contact requesting surgeon's office via preferred method (i.e, phone, fax) to inform them of need for appointment prior to surgery.  If applicable, this message will also be routed to pharmacy pool and/or primary cardiologist for input on holding anticoagulant/antiplatelet agent as requested below so that this information is available at time of patient's appointment.   Deberah Pelton, NP  08/31/2020, 4:30 PM

## 2020-08-31 NOTE — Progress Notes (Signed)
Bradley Rodriguez [address] [place] [address]   CLINIC:  Medical Oncology/Hematology  CONSULT NOTE  Patient Care Team: Caryl Bis, MD as PCP - General (Family Medicine) Harl Bowie, Alphonse Guild, MD as PCP - Cardiology (Cardiology)  CHIEF COMPLAINTS/PURPOSE OF CONSULTATION:  Evaluation of colon cancer  HISTORY OF PRESENTING ILLNESS:  [name] 60 y.o. male is here because of evaluation of colon cancer, at the request of Dr. Laural Golden.  Today he reports feeling good. He has had 4 routine colonoscopies. He denies any bloody or black stools, although he reports dark brown stools over the past few years. He has received 2 iron infusions in July, and he is currently taking an iron tablet everyday. He had his left kidney removed due to a mass in September 2021. He reports good appetite and denies weight loss. He reports a history of Afib.   He worked at [employer] where he reports he may have been exposed to chemicals. He denies smoking history. His mother and father had colon cancer, and he reports his maternal grandfather may have had a bone cancer.   MEDICAL HISTORY:  Past Medical History:  Diagnosis Date   A-fib [facility])    DCCV 2009, 2012, 2014, Nov 2016   Anxiety    Arthritis    Knees, wrist, ankles   Cancer ([place]) 07/2019   Kidney   Diabetes mellitus without complication ([place])    Dysrhythmia 1996   A- Fib   History of kidney stones    HX: anticoagulation    very remotely and briefly on COumadin aound one of his CV, 2014 on Eliquis had hematuria with renal calculi and stopped   Hyperlipidemia    patient denies this   Hypertension    IBS (irritable bowel syndrome)    Normal cardiac stress test    Normal nuclear stress test , 2001    SURGICAL HISTORY: Past Surgical History:  Procedure Laterality Date   BIOPSY  08/19/2020   Procedure: BIOPSY;  Surgeon: Harvel Quale, MD;  [facility];  Service: Gastroenterology;;    CARDIOVERSION N/A 10/15/2012   Procedure: CARDIOVERSION;  Surgeon: Larey Dresser, MD;  Location: [facility] ENDOSCOPY;  Service: Cardiovascular;  Laterality: N/A;   CARDIOVERSION N/A 11/26/2014   Procedure: CARDIOVERSION;  Surgeon: Arnoldo Lenis, MD;  Location: [facility];  Service: Endoscopy;  Laterality: N/A;   CARDIOVERSION N/A 11/15/2015   Procedure: CARDIOVERSION;  Surgeon: Jerline Pain, MD;  Location: [place];  Service: Cardiovascular;  Laterality: N/A;   CARDIOVERSION N/A 08/07/2016   Procedure: CARDIOVERSION;  Surgeon: Herminio Commons, MD;  Location: [facility];  Service: Cardiovascular;  Laterality: N/A;   CARDIOVERSION N/A 09/11/2018   Procedure: CARDIOVERSION;  Surgeon: Arnoldo Lenis, MD;  Location: [facility];  Service: Endoscopy;  Laterality: N/A;   CATARACT EXTRACTION W/PHACO Right 01/18/2014   Procedure: CATARACT EXTRACTION PHACO AND INTRAOCULAR LENS PLACEMENT (IOC);  Surgeon: Tonny Branch, MD;  Location: [facility];  Service: Ophthalmology;  Laterality: Right;  CDE 9.95   COLONOSCOPY WITH PROPOFOL N/A 08/19/2020   Procedure: COLONOSCOPY WITH PROPOFOL;  Surgeon: Harvel Quale, MD;  [facility];  Service: Gastroenterology;  Laterality: N/A;  8:20   CYSTOSCOPY WITH LITHOLAPAXY N/A 10/06/2019   Procedure: CYSTOSCOPY WITH LASER LITHOLAPAXY;  Surgeon: Ceasar Mons, MD;  Location: [facility];  Service: Urology;  Laterality: N/A;   ESOPHAGOGASTRODUODENOSCOPY (EGD) WITH PROPOFOL N/A 08/19/2020   Procedure: ESOPHAGOGASTRODUODENOSCOPY (EGD)  WITH PROPOFOL;  Surgeon: Montez Morita, Quillian Quince, MD;  [facility];  Service: Gastroenterology;  Laterality: N/A;   EYE SURGERY Right    detached retina   FOOT SURGERY Left    KNEE ARTHROSCOPY Right    LITHOTRIPSY     for kidney stones 2002   POLYPECTOMY  08/19/2020   Procedure: POLYPECTOMY INTESTINAL;  Surgeon: Harvel Quale, MD;  [facility];  Service: Gastroenterology;;   POLYPECTOMY   08/19/2020   Procedure: POLYPECTOMY;  Surgeon: Harvel Quale, MD;  [facility];  Service: Gastroenterology;;   ROBOT ASSISTED LAPAROSCOPIC NEPHRECTOMY Left 10/06/2019   Procedure: XI ROBOTIC ASSISTED LAPAROSCOPIC NEPHRECTOMY WITH ADRENALECTOMY;  Surgeon: Ceasar Mons, MD;  Location[facility];  Service: Urology;  Laterality: Left;   SUBMUCOSAL TATTOO INJECTION  08/19/2020   Procedure: SUBMUCOSAL TATTOO INJECTION;  Surgeon: Harvel Quale, MD;  [facility];  Service: Gastroenterology;;   TEE WITHOUT CARDIOVERSION N/A 11/26/2014   Procedure: TRANSESOPHAGEAL ECHOCARDIOGRAM (TEE) WITH PROPOFOL;  Surgeon: Arnoldo Lenis, MD;  [facility];  Service: Endoscopy;  Laterality: N/A;   TEE WITHOUT CARDIOVERSION N/A 11/15/2015   Procedure: TRANSESOPHAGEAL ECHOCARDIOGRAM (TEE);  Surgeon: Jerline Pain, MD;  Location: [facility];  Service: Cardiovascular;  Laterality: N/A;   TEE WITHOUT CARDIOVERSION N/A 09/11/2018   Procedure: TRANSESOPHAGEAL ECHOCARDIOGRAM (TEE) WITH PROPOFOL;  Surgeon: Arnoldo Lenis, MD;  [facility];  Service: Endoscopy;  Laterality: N/A;   VASECTOMY      SOCIAL HISTORY: Social History   Socioeconomic History   Marital status: Married    Spouse name: Not on file   Number of children: Not on file   Years of education: Not on file   Highest education level: Not on file  Occupational History   Not on file  Tobacco Use   Smoking status: Never   Smokeless tobacco: Never  Vaping Use   Vaping Use: Never used  Substance and Sexual Activity   Alcohol use: No    Alcohol/week: 0.0 standard drinks   Drug use: No   Sexual activity: Yes    Birth control/protection: Surgical  Other Topics Concern   Not on file  Social History Narrative   Not on file   Social Determinants of Health   Financial Resource Strain: Low Risk    Difficulty of Paying Living Expenses: Not hard at all  Food Insecurity: No Food  Insecurity   Worried About Charity fundraiser in the Last Year: Never true   [place] in the Last Year: Never true  Transportation Needs: No Transportation Needs   Lack of Transportation (Medical): No   Lack of Transportation (Non-Medical): No  Physical Activity: Inactive   Days of Exercise per Week: 0 days   Minutes of Exercise per Session: 0 min  Stress: No Stress Concern Present   Feeling of Stress : Not at all  Social Connections: Moderately Integrated   Frequency of Communication with Friends and Family: More than three times a week   Frequency of Social Gatherings with Friends and Family: More than three times a week   Attends Religious Services: More than 4 times per year   Active Member of [employer] or Organizations: No   Attends Archivist Meetings: Never   Marital Status: Married  Human resources officer Violence: Not At Risk   Fear of Current or Ex-Partner: No   Emotionally Abused: No   Physically Abused: No   Sexually Abused: No  FAMILY HISTORY: Family History  Problem Relation Age of Onset   Hypertension Other    Diabetes Mother    Hypertension Mother    Diabetes Father     ALLERGIES:  is allergic to ace inhibitors, lisinopril, xarelto [rivaroxaban], and quinine derivatives.  MEDICATIONS:  Current Outpatient Medications  Medication Sig Dispense Refill   amLODipine (NORVASC) 5 MG tablet TAKE 1 TABLET BY MOUTH EVERY DAY (Patient taking differently: Take 5 mg by mouth every evening.) 90 tablet 3   atorvastatin (LIPITOR) 20 MG tablet Take 20 mg by mouth every evening.      Bioflavonoid Products (ESTER C PO) Take 1,000 mg by mouth daily.      Calcium Citrate-Vitamin D (CITRACAL + D PO) Take 1 tablet by mouth daily.      iron polysaccharides (NIFEREX) 150 MG capsule Take 150 mg by mouth 2 (two) times daily.     losartan (COZAAR) 100 MG tablet Take 100 mg by mouth every evening.      metoprolol tartrate (LOPRESSOR) 100 MG tablet TAKE 1 TABLET BY MOUTH  TWICE A DAY (Patient taking differently: Take 100 mg by mouth 2 (two) times daily.) 60 tablet 2   Misc Natural Products (BLOOD SUGAR BALANCE) TABS Take 1 tablet by mouth in the morning and at bedtime. Glucocil     omeprazole (PRILOSEC) 40 MG capsule Take 1 capsule (40 mg total) by mouth 2 (two) times daily. 60 capsule 0   Semaglutide (OZEMPIC, 0.25 OR 0.5 MG/DOSE, [place]) Inject 0.5 mg into the skin every Monday.      Vitamin D, Ergocalciferol, (DRISDOL) 50000 UNITS CAPS capsule Take 50,000 Units by mouth every Saturday.     zinc gluconate 50 MG tablet Take 50 mg by mouth daily.     No current facility-administered medications for this visit.    REVIEW OF SYSTEMS:   Review of Systems  Constitutional:  Negative for appetite change, fatigue and unexpected weight change.  Gastrointestinal:  Positive for diarrhea (occasional).  All other systems reviewed and are negative.   PHYSICAL EXAMINATION: ECOG PERFORMANCE STATUS: 1 - Symptomatic but completely ambulatory  Vitals:   09/01/20 0802  BP: 126/66  Pulse: (!) 57  Resp: 18  Temp: (!) 97 F (36.1 C)  SpO2: 99%   Filed Weights   09/01/20 0802  Weight: (!) 305 lb 11.2 oz (138.7 kg)   Physical Exam Vitals reviewed.  Constitutional:      Appearance: Normal appearance.  Cardiovascular:     Rate and Rhythm: Normal rate and regular rhythm.     Pulses: Normal pulses.     Heart sounds: Normal heart sounds.  Pulmonary:     Effort: Pulmonary effort is normal.     Breath sounds: Normal breath sounds.  Neurological:     General: No focal deficit present.     Mental Status: He is alert and oriented to person, place, and time.  Psychiatric:        Mood and Affect: Mood normal.        Behavior: Behavior normal.     LABORATORY DATA:  I have reviewed the data as listed CBC Latest Ref Rng & Units 10/07/2019 10/06/2019 09/24/2019  WBC 4.0 - 10.5 K/uL - - 6.1  Hemoglobin 13.0 - 17.0 g/dL 9.8(L) 10.2(L) 10.9(L)  Hematocrit 39.0 - 52.0 % 31.3(L)  33.1(L) 35.3(L)  Platelets 150 - 400 K/uL - - 327   CMP Latest Ref Rng & Units 08/25/2020 01/26/2020 10/07/2019  Glucose 70 - 99 mg/dL - -  183(H)  BUN 8 - 23 mg/dL 18 - 15  Creatinine 0.61 - 1.24 mg/dL 1.29(H) 1.30(H) 1.26(H)  Sodium 135 - 145 mmol/L - - 135  Potassium 3.5 - 5.1 mmol/L - - 4.2  Chloride 98 - 111 mmol/L - - 99  CO2 22 - 32 mmol/L - - 25  Calcium 8.9 - 10.3 mg/dL - - 8.7(L)  Total Protein 6.5 - 8.1 g/dL - - -  Total Bilirubin 0.3 - 1.2 mg/dL - - -  Alkaline Phos 38 - 126 U/L - - -  AST 15 - 41 U/L - - -  ALT 0 - 44 U/L - - -    RADIOGRAPHIC STUDIES: I have personally reviewed the radiological images as listed and agreed with the findings in the report. CT CHEST ABDOMEN PELVIS W CONTRAST  Result Date: 08/29/2020 CLINICAL DATA:  Colorectal carcinoma. Additional history of LEFT renal cell carcinoma post nephrectomy. EXAM: CT CHEST, ABDOMEN, AND PELVIS WITH CONTRAST TECHNIQUE: Multidetector CT imaging of the chest, abdomen and pelvis was performed following the standard protocol during bolus administration of intravenous contrast. CONTRAST:  136m OMNIPAQUE IOHEXOL 300 MG/ML  SOLN COMPARISON:  CT 01/26/2020 FINDINGS: CT CHEST FINDINGS Cardiovascular: Coronary artery calcification and aortic atherosclerotic calcification. Mediastinum/Nodes: No axillary or supraclavicular adenopathy. No mediastinal or hilar adenopathy. No pericardial fluid. Esophagus normal. Lungs/Pleura: No suspicious pulmonary nodules. Musculoskeletal: No aggressive osseous lesion. CT ABDOMEN AND PELVIS FINDINGS Hepatobiliary: No focal hepatic lesion. No biliary ductal dilatation. Gallbladder is normal. Common bile duct is normal. Pancreas: Pancreas is normal. No ductal dilatation. No pancreatic inflammation. Spleen: Normal spleen Adrenals/urinary tract: RIGHT adrenal gland and kidney are normal. RIGHT ureter and bladder normal. Post LEFT nephrectomy. No nodularity in nephrectomy bed. Stomach/Bowel: Stomach, duodenum  small-bowel normal. Terminal ileum and appendix normal. There is a circumferential lesion in the RIGHT colon at the level of the hepatic flexure (image 86/2). Several small lymph nodes in the adjacent mesocolon are not pathologic by size criteria. Periportal lymph nodes are within normal size limits. For example 12 mm node on image 75/2). Vascular/Lymphatic: Abdominal aorta is normal caliber with atherosclerotic calcification. There is no retroperitoneal or periportal lymphadenopathy. No pelvic lymphadenopathy. Reproductive: Prostate unremarkable Other: No peritoneal or omental lesions. Musculoskeletal: No aggressive osseous lesion. IMPRESSION: Chest: Chest Impression: 1. No evidence of thoracic metastasis. 2. Coronary artery calcification and Aortic Atherosclerosis (ICD10-I70.0). Abdomen / Pelvis Impression: 1. Circumferential lesion at the hepatic flexure of the colon consistent with primary colorectal carcinoma. 2. No pathologically enlarged lymph node metastasis identified. 3. No liver metastasis. 4. Post LEFT nephrectomy without evidence local recurrence. Electronically Signed   By: SSuzy BouchardM.D.   On: 08/29/2020 12:22    ASSESSMENT:  1.  Right-sided colon cancer: - He had 3 prior colonoscopies, last one 2 years ago by Dr. DWonda Chengin EMaxwell with multiple polyps removed. - Colonoscopy by Dr. CJenetta Downeron 08/19/2020 showed fungating infiltrative ulcerated partially obstructing large mass in the proximal ascending colon.  Mass was partially circumferential.  7 sessile polyps found in the transverse colon, ascending colon and cecum. - Pathology consistent with adenocarcinoma of the biopsy of the mass.  Single fragment of at least intramucosal adenocarcinoma in the transverse colon polypectomy.  Pathology report has descending colon as adenocarcinoma. - CT CAP with contrast on 08/26/2020 shows circumferential lesion at the hepatic flexure of the colon consistent with primary colorectal carcinoma with no  pathologically enlarged lymph node metastasis.  No liver metastasis.  Status post left nephrectomy.  No evidence of thoracic metastasis.  2.  Left kidney renal cell carcinoma: - Incidentally found on work-up for kidney stones. - Left nephrectomy by Dr. Gilford Rile on 10/06/2019 with pathology showing clear-cell renal cell carcinoma, grade 2, 6 cm, margins negative.  1 benign lymph node.  PT1BPN0.  3.  Social/family history: - He is a disabled.  He worked at [employer] and a [employer].  He might have exposed to some chemicals at the later job.  No prior history of smoking. - Family history significant for mother with colon cancer, father with colon cancer and maternal grandfather with bone cancer.   PLAN:  1.  Right-sided colon cancer: - I have reviewed the pathology report in detail. - Even though descending colon polypectomy was reported as adenocarcinoma, Dr. Jenetta Downer felt that it was mislabeled.  The mass was reported in the proximal ascending colon on colonoscopy and close to hepatic flexure on CT scan. - He was evaluated by Dr. Nadeen Landau yesterday.  He will be scheduled for surgical resection of ascending and likely transverse colon. - I would recommend MMR/MSI testing on the biopsy. - We will also consider genetic testing as to whether first-degree family members have colon cancer. - He will come back in 1 month after surgery to discuss pathology report and if there is any need for adjuvant chemotherapy.   All questions were answered. The patient knows to call the clinic with any problems, questions or concerns.    Derek Jack, MD, 09/01/20 8:50 AM  [place] [phone]   I, Thana Ates, am acting as a scribe for Dr. Derek Jack.  I, Derek Jack MD, have reviewed the above documentation for accuracy and completeness, and I agree with the above.

## 2020-09-01 ENCOUNTER — Inpatient Hospital Stay (HOSPITAL_COMMUNITY): Payer: Medicare HMO | Attending: Hematology | Admitting: Hematology

## 2020-09-01 ENCOUNTER — Ambulatory Visit: Payer: Medicare HMO | Admitting: Family Medicine

## 2020-09-01 ENCOUNTER — Other Ambulatory Visit: Payer: Self-pay

## 2020-09-01 ENCOUNTER — Encounter: Payer: Self-pay | Admitting: Family Medicine

## 2020-09-01 ENCOUNTER — Encounter (HOSPITAL_COMMUNITY): Payer: Self-pay | Admitting: Hematology

## 2020-09-01 VITALS — BP 130/70 | HR 58 | Ht >= 80 in | Wt 305.8 lb

## 2020-09-01 DIAGNOSIS — C186 Malignant neoplasm of descending colon: Secondary | ICD-10-CM | POA: Diagnosis not present

## 2020-09-01 DIAGNOSIS — E119 Type 2 diabetes mellitus without complications: Secondary | ICD-10-CM | POA: Insufficient documentation

## 2020-09-01 DIAGNOSIS — Z800 Family history of malignant neoplasm of digestive organs: Secondary | ICD-10-CM | POA: Diagnosis not present

## 2020-09-01 DIAGNOSIS — Z01818 Encounter for other preprocedural examination: Secondary | ICD-10-CM | POA: Diagnosis not present

## 2020-09-01 DIAGNOSIS — I10 Essential (primary) hypertension: Secondary | ICD-10-CM | POA: Diagnosis not present

## 2020-09-01 DIAGNOSIS — I4891 Unspecified atrial fibrillation: Secondary | ICD-10-CM | POA: Diagnosis not present

## 2020-09-01 DIAGNOSIS — I480 Paroxysmal atrial fibrillation: Secondary | ICD-10-CM | POA: Diagnosis not present

## 2020-09-01 DIAGNOSIS — E785 Hyperlipidemia, unspecified: Secondary | ICD-10-CM | POA: Insufficient documentation

## 2020-09-01 DIAGNOSIS — I700 Atherosclerosis of aorta: Secondary | ICD-10-CM | POA: Insufficient documentation

## 2020-09-01 DIAGNOSIS — Z79899 Other long term (current) drug therapy: Secondary | ICD-10-CM | POA: Insufficient documentation

## 2020-09-01 DIAGNOSIS — K589 Irritable bowel syndrome without diarrhea: Secondary | ICD-10-CM | POA: Insufficient documentation

## 2020-09-01 DIAGNOSIS — C182 Malignant neoplasm of ascending colon: Secondary | ICD-10-CM | POA: Diagnosis not present

## 2020-09-01 DIAGNOSIS — C642 Malignant neoplasm of left kidney, except renal pelvis: Secondary | ICD-10-CM | POA: Diagnosis not present

## 2020-09-01 NOTE — Telephone Encounter (Signed)
Since patient is being seen by provider for pre-op clearance, additional input from preop APP not needed for decision making at this time. Per office protocol, the provider should assess clearance at time of office visit and should forward their finalized clearance decision to requesting party below. I will remove this message from the pre-op box.  Bradley Laine PA-C

## 2020-09-01 NOTE — Patient Instructions (Addendum)
[place] at [facility] Discharge Instructions  You were seen and examined today by Dr. Delton Coombes. Dr. Delton Coombes is a medical oncologist, meaning he specializes in the management of cancer diagnoses. Dr. Delton Coombes discussed your past medical history, family history of cancer and the events that led to you being here today.  Dr. Delton Coombes discussed the results of your recent colonoscopy and CT scan. This revealed a type of cancer arising from your colon. It was noted in two areas that were biopsied during your colon cancer. The recommended course of treatment is surgical removal of the cancerous region. Dr. Delton Coombes will reach out to Dr. Dema Severin to discuss plan of care.  Proceed with surgery, follow-up approximately 1 month following surgery. Based on the surgical pathology, Dr. Delton Coombes will determine if there is a need for chemotherapy. As of right now, it appears that you have a Stage II cancer, chemotherapy is generally recommended Stage III (it would be considered Stage III if there is lymph node involvement).   Thank you for choosing [place] at [facility] to provide your oncology and hematology care.  To afford each patient quality time with our provider, please arrive at least 15 minutes before your scheduled appointment time.   If you have a lab appointment with the [facility] please come in thru the Main Entrance and check in at the main information desk.  You need to re-schedule your appointment should you arrive 10 or more minutes late.  We strive to give you quality time with our providers, and arriving late affects you and other patients whose appointments are after yours.  Also, if you no show three or more times for appointments you may be dismissed from the clinic at the providers discretion.     Again, thank you for choosing [facility].  Our hope is that these requests will decrease the amount of time that you  wait before being seen by our physicians.       _____________________________________________________________  Should you have questions after your visit to [facility], please contact our office at [phone] and follow the prompts.  Our office hours are 8:00 a.m. and 4:30 p.m. Monday - Friday.  Please note that voicemails left after 4:00 p.m. may not be returned until the following business day.  We are closed weekends and major holidays.  You do have access to a nurse 24-7, just call the main number to the clinic [phone] and do not press any options, hold on the line and a nurse will answer the phone.    For prescription refill requests, have your pharmacy contact our office and allow 72 hours.    Due to Covid, you will need to wear a mask upon entering the hospital. If you do not have a mask, a mask will be given to you at the Main Entrance upon arrival. For doctor visits, patients may have 1 support person age 49 or older with them. For treatment visits, patients can not have anyone with them due to social distancing guidelines and our immunocompromised population.

## 2020-09-01 NOTE — Telephone Encounter (Signed)
Pt was moved to sooner apt today in the [place] office with Katina Dung.

## 2020-09-01 NOTE — Progress Notes (Signed)
Cardiology Office Note  Date: 09/01/2020   ID: Bradley Rodriguez, DOB 10/12/1960, MRN [id number]  PCP:  Caryl Bis, MD  Cardiologist:  [name], MD Electrophysiologist:  None   Chief Complaint: Needing clearance for colon surgery.  History of Present Illness: Bradley Rodriguez is a 60 y.o. male with a history of PAF followed by Dr. Bronson Ing in [place] clinic.  Previous DCCV's, last 08/10/2016.  From A. fib clinic notes he has failed prophylactic flecainide previously.  A. fib clinic notes that he usually required cardioversion by once per year and also intermittent compliance with Xarelto usually only taken around the time of this DCCV.  DC cardioversion 09/11/2018.  After cardioversion diltiazem which had been recently been started was stopped and restarted as prior Norvasc at 5 mg.  No complaints of recent palpitations.  Compliant with meds.  Blood pressure systolic has been in the [id number] over 70s.  Historically does not tolerate even rate controlled atrial fibrillation.  He was taking Xarelto but was not sure he wanted to take it beyond 1 month after the cardioversion.  He stated he retrieved in contact with A. fib clinic when he was ready for follow-up.  His blood pressure was elevated in clinic but home numbers was regular.  He was to continue current medications.  If necessary.  Increase his Norvasc in the future.  He had previous history of hematuria and urinary obstruction with history of kidney stones with lower abdominal suprapubic discomfort.  Previous finding of a 6.3 x 6.2 cm mass in posterior aspect of upper lobe of left kidney suspicious for solid mass with evidence of left renal neoplasm.  He underwent cystolitholopaxy TURP and left nephrectomy in the interim since last visit with Dr. Lovena Neighbours on 10/06/2019  Recent colonoscopy on 08/19/2020 A fungating, infiltrative and ulcerated partially obstructing large mass was found in the proximal ascending colon. The mass was partially  circumferential (involving two-thirds of the lumen circumference). The mass measured three cm in length. Oozing was present. This was biopsied with a cold forceps for histology. An area 1cm distal to the mass was successfully injected with 1 mL of a 1:10,000 solution of epinephrine for tattooing.  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  He will be having his surgery performed by Dr. Nadeen Landau at [facility] surgery.  He is here today with no particular complaints.  He denies any anginal or exertional symptoms, orthostatic symptoms, CVA or TIA-like symptoms, palpitations or arrhythmias, CVA or TIA-like symptoms, PND, orthopnea, bleeding.  Denies any claudication-like symptoms, DVT or PE-like symptoms, or lower extremity edema.  EKG today shows sinus bradycardia with a rate of 56.   Past Medical History:  Diagnosis Date   A-fib [facility])    DCCV 2009, 2012, 2014, Nov 2016   Anxiety    Arthritis    Knees, wrist, ankles   Cancer ([place]) 07/2019   Kidney   Diabetes mellitus without complication ([place])    Dysrhythmia 1996   A- Fib   History of kidney stones    HX: anticoagulation    very remotely and briefly on COumadin aound one of his CV, 2014 on Eliquis had hematuria with renal calculi and stopped   Hyperlipidemia    patient denies this   Hypertension    IBS (irritable bowel syndrome)    Normal cardiac  stress test    Normal nuclear stress test , 2001    Past Surgical History:  Procedure Laterality Date   BIOPSY  08/19/2020   Procedure: BIOPSY;  Surgeon: Harvel Quale, MD;  [facility];  Service: Gastroenterology;;   CARDIOVERSION N/A 10/15/2012   Procedure: CARDIOVERSION;  Surgeon: Larey Dresser, MD;  Location: [facility] ENDOSCOPY;  Service: Cardiovascular;  Laterality: N/A;   CARDIOVERSION N/A  11/26/2014   Procedure: CARDIOVERSION;  Surgeon: Arnoldo Lenis, MD;  Location: [facility] ORS;  Service: Endoscopy;  Laterality: N/A;   CARDIOVERSION N/A 11/15/2015   Procedure: CARDIOVERSION;  Surgeon: Jerline Pain, MD;  Location: [place];  Service: Cardiovascular;  Laterality: N/A;   CARDIOVERSION N/A 08/07/2016   Procedure: CARDIOVERSION;  Surgeon: Herminio Commons, MD;  Location: [facility] OR[facility];  Service: Cardiovascular;  Laterality: N/A;   CARDIOVERSION N/A 09/11/2018   Procedure: CARDIOVERSION;  Surgeon: Arnoldo Lenis, MD;  Location: [facility] OR[facility];  Service: Endoscopy;  Laterality: N/A;   CATARACT EXTRACTION W/PHACO Right 01/18/2014   Procedure: CATARACT EXTRACTION PHACO AND INTRAOCULAR LENS PLACEMENT (IOC);  Surgeon: Tonny Branch, MD;  Location: [facility];  Service: Ophthalmology;  Laterality: Right;  CDE 9.95   COLONOSCOPY WITH PROPOFOL N/A 08/19/2020   Procedure: COLONOSCOPY WITH PROPOFOL;  Surgeon: Harvel Quale, MD;  [facility];  Service: Gastroenterology;  Laterality: N/A;  8:20   CYSTOSCOPY WITH LITHOLAPAXY N/A 10/06/2019   Procedure: CYSTOSCOPY WITH LASER LITHOLAPAXY;  Surgeon: Ceasar Mons, MD;  Location: [facility];  Service: Urology;  Laterality: N/A;   ESOPHAGOGASTRODUODENOSCOPY (EGD) WITH PROPOFOL N/A 08/19/2020   Procedure: ESOPHAGOGASTRODUODENOSCOPY (EGD) WITH PROPOFOL;  Surgeon: Harvel Quale, MD;  [facility];  Service: Gastroenterology;  Laterality: N/A;   EYE SURGERY Right    detached retina   FOOT SURGERY Left    KNEE ARTHROSCOPY Right    LITHOTRIPSY     for kidney stones 2002   POLYPECTOMY  08/19/2020   Procedure: POLYPECTOMY INTESTINAL;  Surgeon: Harvel Quale, MD;  [facility];  Service: Gastroenterology;;   POLYPECTOMY  08/19/2020   Procedure: POLYPECTOMY;  Surgeon: Harvel Quale, MD;  [facility];  Service: Gastroenterology;;   ROBOT ASSISTED LAPAROSCOPIC NEPHRECTOMY Left  10/06/2019   Procedure: XI ROBOTIC ASSISTED LAPAROSCOPIC NEPHRECTOMY WITH ADRENALECTOMY;  Surgeon: Ceasar Mons, MD;  Location: [facility];  Service: Urology;  Laterality: Left;   SUBMUCOSAL TATTOO INJECTION  08/19/2020   Procedure: SUBMUCOSAL TATTOO INJECTION;  Surgeon: Harvel Quale, MD;  [facility];  Service: Gastroenterology;;   TEE WITHOUT CARDIOVERSION N/A 11/26/2014   Procedure: TRANSESOPHAGEAL ECHOCARDIOGRAM (TEE) WITH PROPOFOL;  Surgeon: Arnoldo Lenis, MD;  Location[facility];  Service: Endoscopy;  Laterality: N/A;   TEE WITHOUT CARDIOVERSION N/A 11/15/2015   Procedure: TRANSESOPHAGEAL ECHOCARDIOGRAM (TEE);  Surgeon: Jerline Pain, MD;  Location: [place];  Service: Cardiovascular;  Laterality: N/A;   TEE WITHOUT CARDIOVERSION N/A 09/11/2018   Procedure: TRANSESOPHAGEAL ECHOCARDIOGRAM (TEE) WITH PROPOFOL;  Surgeon: Arnoldo Lenis, MD;  Location[facility];  Service: Endoscopy;  Laterality: N/A;   VASECTOMY      Current Outpatient Medications  Medication Sig Dispense Refill   amLODipine (NORVASC) 5 MG tablet TAKE 1 TABLET BY MOUTH EVERY DAY (Patient taking differently: Take 5 mg by mouth every evening.) 90 tablet 3   atorvastatin (LIPITOR) 20 MG tablet Take 20 mg by mouth every evening.      losartan (COZAAR) 100 MG tablet  Take 100 mg by mouth every evening.      metoprolol tartrate (LOPRESSOR) 100 MG tablet TAKE 1 TABLET BY MOUTH TWICE A DAY (Patient taking differently: Take 100 mg by mouth 2 (two) times daily.) 60 tablet 2   Misc Natural Products (BLOOD SUGAR BALANCE) TABS Take 1 tablet by mouth in the morning and at bedtime. Glucocil     Semaglutide (OZEMPIC, 0.25 OR 0.5 MG/DOSE, [place]) Inject 0.5 mg into the skin every Monday.      Vitamin D, Ergocalciferol, (DRISDOL) 50000 UNITS CAPS capsule Take 50,000 Units by mouth every Saturday.     zinc gluconate 50 MG tablet Take 50 mg by mouth daily.     Bioflavonoid Products (ESTER C PO) Take 1,000 mg by  mouth daily.  (Patient not taking: Reported on 09/01/2020)     Calcium Citrate-Vitamin D (CITRACAL + D PO) Take 1 tablet by mouth daily.  (Patient not taking: Reported on 09/01/2020)     iron polysaccharides (NIFEREX) 150 MG capsule Take 150 mg by mouth 2 (two) times daily. (Patient not taking: Reported on 09/01/2020)     omeprazole (PRILOSEC) 40 MG capsule Take 1 capsule (40 mg total) by mouth 2 (two) times daily. (Patient not taking: Reported on 09/01/2020) 60 capsule 0   No current facility-administered medications for this visit.   Allergies:  Ace inhibitors, Lisinopril, Xarelto [rivaroxaban], and Quinine derivatives   Social History: The patient  reports that he has never smoked. He has never used smokeless tobacco. He reports that he does not drink alcohol and does not use drugs.   Family History: The patient's family history includes Diabetes in his father and mother; Hypertension in his mother and another family member.   ROS:  Please see the history of present illness. Otherwise, complete review of systems is positive for none.  All other systems are reviewed and negative.   Physical Exam: VS:  BP 130/70   Pulse (!) 58   Ht '6\' 8"'$  (2.032 m)   Wt (!) 305 lb 12.8 oz (138.7 kg)   SpO2 97%   BMI 33.59 kg/m , BMI Body mass index is 33.59 kg/m.  Wt Readings from Last 3 Encounters:  09/01/20 (!) 305 lb 12.8 oz (138.7 kg)  09/01/20 (!) 305 lb 11.2 oz (138.7 kg)  08/19/20 299 lb (135.6 kg)    General: Patient appears comfortable at rest. Neck: Supple, no elevated JVP or carotid bruits, no thyromegaly. Lungs: Clear to auscultation, nonlabored breathing at rest. Cardiac: Regular rate and rhythm, no S3 or significant systolic murmur, no pericardial rub. Extremities: No pitting edema, distal pulses 2+. Skin: Warm and dry. Musculoskeletal: No kyphosis. Neuropsychiatric: Alert and oriented x3, affect grossly appropriate.  ECG: 09/01/2020 EKG sinus bradycardia with rate of 56.  Recent  Labwork: 09/24/2019: Platelets 327 10/07/2019: Hemoglobin 9.8; Potassium 4.2; Sodium 135 08/25/2020: BUN 18; Creatinine, Ser 1.29  No results found for: CHOL, TRIG, HDL, CHOLHDL, VLDL, LDLCALC, LDLDIRECT  Other Studies Reviewed Today:  Echocardiogram 09/11/2018  1. The left ventricle has normal systolic function, with an ejection fraction of 55-60%. The cavity size was normal. 2. Left atrial size was Left atrium is enlarged. 3. No evidence of intracardiac thrombus. Normal LA appendage without thrombus, normal emptying velocity 70cm/s. 4. Mitral valve regurgitation is mild to moderate by color flow Doppler. 5. The aortic valve is tricuspid No stenosis of the aortic valve. 6. The aortic root is normal in size and structure.  Assessment and Plan:  1. Pre-operative clearance  2. Paroxysmal atrial fibrillation ([place])   3. Essential hypertension     1. Pre-operative clearance: Patient has pending colon surgery due to colon mass as noted above in HPI.  Mass was recently discovered on a routine colonoscopy.  His revised cardiac risk index equals 1 placing him at a perioperative risk of major cardiac event at 0.9%.  His Duke activity status index score is 50.7 giving him a functional capacity and METS at 8.9.  From a cardiac standpoint he is cleared to undergo colon resection under general anesthesia.  Patient states he will undergo colon resection with Dr. Nadeen Landau at [facility] surgery once surgery clearance received.  Office fax number is [phone], attention Mammie Lorenzo, LPN.   2. Paroxysmal atrial fibrillation (HCC) EKG today sinus bradycardia with a rate of 56.  He denies any recent episodes of palpitations.  He had previously stopped his Xarelto due to side effects.  He continues on aspirin 81 mg daily.  He does not wish for any further anticoagulation therapy.  He is aware of the risk of stroke per his statement.  Continue metoprolol 100 mg p.o. twice daily.  3.  Essential hypertension Blood pressure well controlled on current therapy at 130/70.  Continue amlodipine 5 mg p.o. daily.  Continue losartan 100 mg p.o. daily.  Continue metoprolol 100 mg p.o. twice daily.   Medication Adjustments/Labs and Tests Ordered: Current medicines are reviewed at length with the patient today.  Concerns regarding medicines are outlined above.   Disposition: Follow-up with Dr. Harl Bowie or [facility] in 1 year  Signed, Levell July, NP 09/01/2020 9:57 AM    [facility] [place], [place], [place] [address] Phone: [phone]; Fax: [phone]

## 2020-09-01 NOTE — Progress Notes (Signed)
I met with the patient and his wife today during and after the patient's initial visit with Dr. Delton Coombes. I introduced myself and explained my role in the patient's care. I provided the patient and his wife with my contact information and encouraged them to call with questions or concerns.

## 2020-09-01 NOTE — Patient Instructions (Signed)

## 2020-09-06 DIAGNOSIS — E7849 Other hyperlipidemia: Secondary | ICD-10-CM | POA: Diagnosis not present

## 2020-09-06 DIAGNOSIS — E1165 Type 2 diabetes mellitus with hyperglycemia: Secondary | ICD-10-CM | POA: Diagnosis not present

## 2020-09-06 DIAGNOSIS — D529 Folate deficiency anemia, unspecified: Secondary | ICD-10-CM | POA: Diagnosis not present

## 2020-09-06 DIAGNOSIS — E782 Mixed hyperlipidemia: Secondary | ICD-10-CM | POA: Diagnosis not present

## 2020-09-06 DIAGNOSIS — D649 Anemia, unspecified: Secondary | ICD-10-CM | POA: Diagnosis not present

## 2020-09-06 DIAGNOSIS — E8881 Metabolic syndrome: Secondary | ICD-10-CM | POA: Diagnosis not present

## 2020-09-06 DIAGNOSIS — D519 Vitamin B12 deficiency anemia, unspecified: Secondary | ICD-10-CM | POA: Diagnosis not present

## 2020-09-09 DIAGNOSIS — I480 Paroxysmal atrial fibrillation: Secondary | ICD-10-CM | POA: Diagnosis not present

## 2020-09-09 DIAGNOSIS — E7849 Other hyperlipidemia: Secondary | ICD-10-CM | POA: Diagnosis not present

## 2020-09-09 DIAGNOSIS — D500 Iron deficiency anemia secondary to blood loss (chronic): Secondary | ICD-10-CM | POA: Diagnosis not present

## 2020-09-09 DIAGNOSIS — F331 Major depressive disorder, recurrent, moderate: Secondary | ICD-10-CM | POA: Diagnosis not present

## 2020-09-09 DIAGNOSIS — K580 Irritable bowel syndrome with diarrhea: Secondary | ICD-10-CM | POA: Diagnosis not present

## 2020-09-09 DIAGNOSIS — E8881 Metabolic syndrome: Secondary | ICD-10-CM | POA: Diagnosis not present

## 2020-09-09 DIAGNOSIS — E1142 Type 2 diabetes mellitus with diabetic polyneuropathy: Secondary | ICD-10-CM | POA: Diagnosis not present

## 2020-09-09 DIAGNOSIS — I10 Essential (primary) hypertension: Secondary | ICD-10-CM | POA: Diagnosis not present

## 2020-09-10 ENCOUNTER — Other Ambulatory Visit (INDEPENDENT_AMBULATORY_CARE_PROVIDER_SITE_OTHER): Payer: Self-pay | Admitting: Gastroenterology

## 2020-09-15 ENCOUNTER — Ambulatory Visit (HOSPITAL_COMMUNITY): Payer: Medicare HMO

## 2020-09-15 ENCOUNTER — Ambulatory Visit: Payer: Medicare HMO | Admitting: Family Medicine

## 2020-09-16 LAB — SURGICAL PATHOLOGY

## 2020-09-19 ENCOUNTER — Ambulatory Visit: Payer: Self-pay | Admitting: Surgery

## 2020-09-19 NOTE — Progress Notes (Signed)
Please place order for PAT appointment scheduled 09/21/20.

## 2020-09-19 NOTE — Progress Notes (Addendum)
COVID swab appointment: 09/28/20  COVID Vaccine Completed: No Date COVID Vaccine completed: Has received booster: COVID vaccine manufacturer: [place]   Date of COVID positive in last 90 days: No  PCP - Gar Ponto, MD Cardiologist -  Quay Burow, MD  Cardiac clearance- note 09/01/20 by Levell July  Chest x-ray - CT 08/26/20 Epic EKG - 09/01/20 Epic Stress Test - 2001 ECHO - 09/11/18 Epic Cardiac Cath - pt says yes once, Pacemaker/ICD device last checked:N/a Spinal Cord Stimulator: N/a  Sleep Study - yes, negative sleep apnea CPAP -   Fasting Blood Sugar - 99-120 Checks Blood Sugar __1_ times a day  Blood Thinner Instructions: N/a Aspirin Instructions: Last Dose:  Activity level: Can go up a flight of stairs and perform activities of daily living without stopping and without symptoms of chest pain or shortness of breath.       Anesthesia review: A fib, HTN, one kidney, DM  Patient denies shortness of breath, fever, cough and chest pain at PAT appointment   Patient verbalized understanding of instructions that were given to them at the PAT appointment. Patient was also instructed that they will need to review over the PAT instructions again at home before surgery.

## 2020-09-19 NOTE — Patient Instructions (Addendum)
DUE TO COVID-19 ONLY ONE VISITOR IS ALLOWED TO COME WITH YOU AND STAY IN THE WAITING ROOM ONLY DURING PRE OP AND PROCEDURE.   **NO VISITORS ARE ALLOWED IN THE SHORT STAY AREA OR RECOVERY ROOM!!**  IF YOU WILL BE ADMITTED INTO THE HOSPITAL YOU ARE ALLOWED ONLY TWO SUPPORT PEOPLE DURING VISITATION HOURS ONLY (10AM -8PM)   The support person(s) may change daily. The support person(s) must pass our screening, gel in and out, and wear a mask at all times, including in the patient's room. Patients must also wear a mask when staff or their support person are in the room.  No visitors under the age of 75. Any visitor under the age of 41 must be accompanied by an adult.    COVID SWAB TESTING MUST BE COMPLETED ON:  09/28/20 **MUST PRESENT COMPLETED FORM AT TESTING SITE**    [place] [facility] [place] (backside of the building) Open 8am-3pm. No appointment needed. You are not required to quarantine, however you are required to wear a well-fitted mask when you are out and around people not in your household.  Hand Hygiene often Do NOT share personal items Notify your provider if you are in close contact with someone who has COVID or you develop fever 100.4 or greater, new onset of sneezing, cough, sore throat, shortness of breath or body aches.       Your procedure is scheduled on: 09/30/20   Report to [facility] Main  Entrance   Report to Short Stay at 5:15 AM   [facility])   Call this number if you have problems the morning of surgery [phone]   Do not eat food :After Midnight.   May have liquids until 4:30 AM day of surgery  CLEAR LIQUID DIET  Foods Allowed                                                                     Foods Excluded  Water, Black Coffee and tea (No milk or creamer)           liquids that you cannot  Plain Jell-O in any flavor  (No red)                                    see through such as: Fruit ices (not with fruit pulp)                                             milk, soups, orange juice              Iced Popsicles (No red)                                               All solid food  Apple juices Sports drinks like Gatorade (No red) Lightly seasoned clear broth or consume(fat free) Sugar     Drink 2 G2 drinks the night before surgery, have complete by 10pm.  Complete one G2 drink the morning of surgery by 4:30 AM    The day of surgery:  Drink ONE (1) Pre-Surgery G2 by 4:30 am the morning of surgery. Drink in one sitting. Do not sip.  This drink was given to you during your hospital  pre-op appointment visit. Nothing else to drink after completing the  Pre-Surgery G2.          If you have questions, please contact your surgeon's office.     Oral Hygiene is also important to reduce your risk of infection.                                    Remember - BRUSH YOUR TEETH THE MORNING OF SURGERY WITH YOUR REGULAR TOOTHPASTE   Take these medicines the morning of surgery with A SIP OF WATER: Metoprolol Tartate  DO NOT TAKE ANY ORAL DIABETIC MEDICATIONS DAY OF YOUR SURGERY  How to Manage Your Diabetes Before and After Surgery  Why is it important to control my blood sugar before and after surgery? Improving blood sugar levels before and after surgery helps healing and can limit problems. A way of improving blood sugar control is eating a healthy diet by:  Eating less sugar and carbohydrates  Increasing activity/exercise  Talking with your doctor about reaching your blood sugar goals High blood sugars (greater than 180 mg/dL) can raise your risk of infections and slow your recovery, so you will need to focus on controlling your diabetes during the weeks before surgery. Make sure that the doctor who takes care of your diabetes knows about your planned surgery including the date and location.  How do I manage my blood sugar before surgery? Check your blood sugar at least 4 times a day, starting  2 days before surgery, to make sure that the level is not too high or low. Check your blood sugar the morning of your surgery when you wake up and every 2 hours until you get to the Short Stay unit. If your blood sugar is less than 70 mg/dL, you will need to treat for low blood sugar: Do not take insulin. Treat a low blood sugar (less than 70 mg/dL) with  cup of clear juice (cranberry or apple), 4 glucose tablets, OR glucose gel. Recheck blood sugar in 15 minutes after treatment (to make sure it is greater than 70 mg/dL). If your blood sugar is not greater than 70 mg/dL on recheck, call [phone] for further instructions. Report your blood sugar to the short stay nurse when you get to Short Stay.  If you are admitted to the hospital after surgery: Your blood sugar will be checked by the staff and you will probably be given insulin after surgery (instead of oral diabetes medicines) to make sure you have good blood sugar levels. The goal for blood sugar control after surgery is 80-180 mg/dL.   Reviewed and Endorsed by [facility] Patient Education Committee, August 2015                               You may not have any metal on your body including jewelry, and body piercing  Do not wear lotions, powders, cologne, or deodorant              Men may shave face and neck.   Do not bring valuables to the hospital. [place].   Bring small overnight bag day of surgery.   Please read over the following fact sheets you were given: IF YOU HAVE QUESTIONS ABOUT YOUR PRE OP INSTRUCTIONS PLEASE CALL [phone]- [facility] - Preparing for Surgery Before surgery, you can play an important role.  Because skin is not sterile, your skin needs to be as free of germs as possible.  You can reduce the number of germs on your skin by washing with CHG (chlorahexidine gluconate) soap before surgery.  CHG is an antiseptic cleaner which  kills germs and bonds with the skin to continue killing germs even after washing. Please DO NOT use if you have an allergy to CHG or antibacterial soaps.  If your skin becomes reddened/irritated stop using the CHG and inform your nurse when you arrive at Short Stay. Do not shave (including legs and underarms) for at least 48 hours prior to the first CHG shower.  You may shave your face/neck.  Please follow these instructions carefully:  1.  Shower with CHG Soap the night before surgery and the  morning of surgery.  2.  If you choose to wash your hair, wash your hair first as usual with your normal  shampoo.  3.  After you shampoo, rinse your hair and body thoroughly to remove the shampoo.                             4.  Use CHG as you would any other liquid soap.  You can apply chg directly to the skin and wash.  Gently with a scrungie or clean washcloth.  5.  Apply the CHG Soap to your body ONLY FROM THE NECK DOWN.   Do   not use on face/ open                           Wound or open sores. Avoid contact with eyes, ears mouth and   genitals (private parts).                       Wash face,  Genitals (private parts) with your normal soap.             6.  Wash thoroughly, paying special attention to the area where your    surgery  will be performed.  7.  Thoroughly rinse your body with warm water from the neck down.  8.  DO NOT shower/wash with your normal soap after using and rinsing off the CHG Soap.                9.  Pat yourself dry with a clean towel.            10.  Wear clean pajamas.            11.  Place clean sheets on your bed the night of your first shower and do not  sleep with pets. Day of Surgery : Do not apply any lotions/deodorants the morning of surgery.  Please wear clean clothes to the hospital/surgery center.  FAILURE TO FOLLOW THESE INSTRUCTIONS MAY RESULT IN THE CANCELLATION OF YOUR SURGERY  PATIENT SIGNATURE_________________________________  NURSE  SIGNATURE__________________________________  ________________________________________________________________________  WHAT IS A BLOOD TRANSFUSION? Blood Transfusion Information  A transfusion is the replacement of blood or some of its parts. Blood is made up of multiple cells which provide different functions. Red blood cells carry oxygen and are used for blood loss replacement. White blood cells fight against infection. Platelets control bleeding. Plasma helps clot blood. Other blood products are available for specialized needs, such as hemophilia or other clotting disorders. BEFORE THE TRANSFUSION  Who gives blood for transfusions?  Healthy volunteers who are fully evaluated to make sure their blood is safe. This is blood bank blood. Transfusion therapy is the safest it has ever been in the practice of medicine. Before blood is taken from a donor, a complete history is taken to make sure that person has no history of diseases nor engages in risky social behavior (examples are intravenous drug use or sexual activity with multiple partners). The donor's travel history is screened to minimize risk of transmitting infections, such as malaria. The donated blood is tested for signs of infectious diseases, such as HIV and hepatitis. The blood is then tested to be sure it is compatible with you in order to minimize the chance of a transfusion reaction. If you or a relative donates blood, this is often done in anticipation of surgery and is not appropriate for emergency situations. It takes many days to process the donated blood. RISKS AND COMPLICATIONS Although transfusion therapy is very safe and saves many lives, the main dangers of transfusion include:  Getting an infectious disease. Developing a transfusion reaction. This is an allergic reaction to something in the blood you were given. Every precaution is taken to prevent this. The decision to have a blood transfusion has been considered carefully  by your caregiver before blood is given. Blood is not given unless the benefits outweigh the risks. AFTER THE TRANSFUSION Right after receiving a blood transfusion, you will usually feel much better and more energetic. This is especially true if your red blood cells have gotten low (anemic). The transfusion raises the level of the red blood cells which carry oxygen, and this usually causes an energy increase. The nurse administering the transfusion will monitor you carefully for complications. HOME CARE INSTRUCTIONS  No special instructions are needed after a transfusion. You may find your energy is better. Speak with your caregiver about any limitations on activity for underlying diseases you may have. SEEK MEDICAL CARE IF:  Your condition is not improving after your transfusion. You develop redness or irritation at the intravenous (IV) site. SEEK IMMEDIATE MEDICAL CARE IF:  Any of the following symptoms occur over the next 12 hours: Shaking chills. You have a temperature by mouth above 102 F (38.9 C), not controlled by medicine. Chest, back, or muscle pain. People around you feel you are not acting correctly or are confused. Shortness of breath or difficulty breathing. Dizziness and fainting. You get a rash or develop hives. You have a decrease in urine output. Your urine turns a dark color or changes to pink, red, or brown. Any of the following symptoms occur over the next 10 days: You have a temperature by mouth above 102 F (38.9 C), not controlled by medicine. Shortness of breath. Weakness after normal activity. The white part of the eye turns yellow (jaundice). You have a decrease in the amount of urine or are urinating less often. Your  urine turns a dark color or changes to pink, red, or brown. Document Released: 01/06/2000 Document Revised: 04/02/2011 Document Reviewed: 08/25/2007 ExitCare Patient Information 2014 [place].  _______________________________________________________________________     Incentive Spirometer  An incentive spirometer is a tool that can help keep your lungs clear and active. This tool measures how well you are filling your lungs with each breath. Taking long deep breaths may help reverse or decrease the chance of developing breathing (pulmonary) problems (especially infection) following: A long period of time when you are unable to move or be active. BEFORE THE PROCEDURE  If the spirometer includes an indicator to show your best effort, your nurse or respiratory therapist will set it to a desired goal. If possible, sit up straight or lean slightly forward. Try not to slouch. Hold the incentive spirometer in an upright position. INSTRUCTIONS FOR USE  Sit on the edge of your bed if possible, or sit up as far as you can in bed or on a chair. Hold the incentive spirometer in an upright position. Breathe out normally. Place the mouthpiece in your mouth and seal your lips tightly around it. Breathe in slowly and as deeply as possible, raising the piston or the ball toward the top of the column. Hold your breath for 3-5 seconds or for as long as possible. Allow the piston or ball to fall to the bottom of the column. Remove the mouthpiece from your mouth and breathe out normally. Rest for a few seconds and repeat Steps 1 through 7 at least 10 times every 1-2 hours when you are awake. Take your time and take a few normal breaths between deep breaths. The spirometer may include an indicator to show your best effort. Use the indicator as a goal to work toward during each repetition. After each set of 10 deep breaths, practice coughing to be sure your lungs are clear. If you have an incision (the cut made at the time of surgery), support your incision when coughing by placing a pillow or rolled up towels firmly against it. Once you are able to get out of bed, walk around indoors and cough  well. You may stop using the incentive spirometer when instructed by your caregiver.  RISKS AND COMPLICATIONS Take your time so you do not get dizzy or light-headed. If you are in pain, you may need to take or ask for pain medication before doing incentive spirometry. It is harder to take a deep breath if you are having pain. AFTER USE Rest and breathe slowly and easily. It can be helpful to keep track of a log of your progress. Your caregiver can provide you with a simple table to help with this. If you are using the spirometer at home, follow these instructions: [place] IF:  You are having difficultly using the spirometer. You have trouble using the spirometer as often as instructed. Your pain medication is not giving enough relief while using the spirometer. You develop fever of 100.5 F (38.1 C) or higher. SEEK IMMEDIATE MEDICAL CARE IF:  You cough up bloody sputum that had not been present before. You develop fever of 102 F (38.9 C) or greater. You develop worsening pain at or near the incision site. MAKE SURE YOU:  Understand these instructions. Will watch your condition. Will get help right away if you are not doing well or get worse. Document Released: 05/21/2006 Document Revised: 04/02/2011 Document Reviewed: 07/22/2006 [facility] Patient Information 2014 [place], [place].   ________________________________________________________________________

## 2020-09-21 ENCOUNTER — Other Ambulatory Visit: Payer: Self-pay

## 2020-09-21 ENCOUNTER — Encounter (HOSPITAL_COMMUNITY)
Admission: RE | Admit: 2020-09-21 | Discharge: 2020-09-21 | Disposition: A | Discharge status: Home / Self Care | Payer: Medicare HMO | Source: Ambulatory Visit | Attending: Surgery | Admitting: Surgery

## 2020-09-21 ENCOUNTER — Encounter (HOSPITAL_COMMUNITY): Payer: Self-pay

## 2020-09-21 DIAGNOSIS — I4891 Unspecified atrial fibrillation: Secondary | ICD-10-CM | POA: Insufficient documentation

## 2020-09-21 DIAGNOSIS — E7849 Other hyperlipidemia: Secondary | ICD-10-CM | POA: Diagnosis not present

## 2020-09-21 DIAGNOSIS — Z7901 Long term (current) use of anticoagulants: Secondary | ICD-10-CM | POA: Insufficient documentation

## 2020-09-21 DIAGNOSIS — K219 Gastro-esophageal reflux disease without esophagitis: Secondary | ICD-10-CM | POA: Insufficient documentation

## 2020-09-21 DIAGNOSIS — I10 Essential (primary) hypertension: Secondary | ICD-10-CM | POA: Insufficient documentation

## 2020-09-21 DIAGNOSIS — I480 Paroxysmal atrial fibrillation: Secondary | ICD-10-CM | POA: Diagnosis not present

## 2020-09-21 DIAGNOSIS — Z01812 Encounter for preprocedural laboratory examination: Secondary | ICD-10-CM | POA: Insufficient documentation

## 2020-09-21 DIAGNOSIS — C189 Malignant neoplasm of colon, unspecified: Secondary | ICD-10-CM | POA: Diagnosis not present

## 2020-09-21 DIAGNOSIS — E1165 Type 2 diabetes mellitus with hyperglycemia: Secondary | ICD-10-CM | POA: Diagnosis not present

## 2020-09-21 DIAGNOSIS — E119 Type 2 diabetes mellitus without complications: Secondary | ICD-10-CM | POA: Insufficient documentation

## 2020-09-21 DIAGNOSIS — Z79899 Other long term (current) drug therapy: Secondary | ICD-10-CM | POA: Insufficient documentation

## 2020-09-21 DIAGNOSIS — Z7984 Long term (current) use of oral hypoglycemic drugs: Secondary | ICD-10-CM | POA: Diagnosis not present

## 2020-09-21 HISTORY — DX: Gastro-esophageal reflux disease without esophagitis: K21.9

## 2020-09-21 LAB — CBC WITH DIFFERENTIAL/PLATELET
Abs Immature Granulocytes: 0.05 10*3/uL (ref 0.00–0.07)
Basophils Absolute: 0.1 10*3/uL (ref 0.0–0.1)
Basophils Relative: 1 %
Eosinophils Absolute: 0.2 10*3/uL (ref 0.0–0.5)
Eosinophils Relative: 3 %
HCT: 33.2 % — ABNORMAL LOW (ref 39.0–52.0)
Hemoglobin: 10.1 g/dL — ABNORMAL LOW (ref 13.0–17.0)
Immature Granulocytes: 1 %
Lymphocytes Relative: 13 %
Lymphs Abs: 0.9 10*3/uL (ref 0.7–4.0)
MCH: 25.7 pg — ABNORMAL LOW (ref 26.0–34.0)
MCHC: 30.4 g/dL (ref 30.0–36.0)
MCV: 84.5 fL (ref 80.0–100.0)
Monocytes Absolute: 0.6 10*3/uL (ref 0.1–1.0)
Monocytes Relative: 9 %
Neutro Abs: 4.7 10*3/uL (ref 1.7–7.7)
Neutrophils Relative %: 73 %
Platelets: 380 10*3/uL (ref 150–400)
RBC: 3.93 MIL/uL — ABNORMAL LOW (ref 4.22–5.81)
RDW: 14.9 % (ref 11.5–15.5)
WBC: 6.5 10*3/uL (ref 4.0–10.5)
nRBC: 0 % (ref 0.0–0.2)

## 2020-09-21 LAB — COMPREHENSIVE METABOLIC PANEL
ALT: 18 U/L (ref 0–44)
AST: 15 U/L (ref 15–41)
Albumin: 4.3 g/dL (ref 3.5–5.0)
Alkaline Phosphatase: 126 U/L (ref 38–126)
Anion gap: 9 (ref 5–15)
BUN: 22 mg/dL (ref 8–23)
CO2: 25 mmol/L (ref 22–32)
Calcium: 9.8 mg/dL (ref 8.9–10.3)
Chloride: 108 mmol/L (ref 98–111)
Creatinine, Ser: 1.26 mg/dL — ABNORMAL HIGH (ref 0.61–1.24)
GFR, Estimated: >60 mL/min (ref >60)
Glucose, Bld: 116 mg/dL — ABNORMAL HIGH (ref 70–99)
Potassium: 4.7 mmol/L (ref 3.5–5.1)
Sodium: 142 mmol/L (ref 135–145)
Total Bilirubin: 0.9 mg/dL (ref 0.3–1.2)
Total Protein: 7.8 g/dL (ref 6.5–8.1)

## 2020-09-21 LAB — GLUCOSE, CAPILLARY: Glucose-Capillary: 121 mg/dL — ABNORMAL HIGH (ref 70–99)

## 2020-09-22 LAB — HEMOGLOBIN A1C
Hgb A1c MFr Bld: 6.3 % — ABNORMAL HIGH (ref 4.8–5.6)
Mean Plasma Glucose: 134 mg/dL

## 2020-09-22 NOTE — Progress Notes (Signed)
Anesthesia Chart Review   Case: [id number] Date/Time: 09/30/20 0715   Procedure: LAPAROSCOPIC RIGHT HEMI COLECTOMY   Anesthesia type: General   Pre-op diagnosis: COLON CANCER   Location[facility] ROOM 01 / [facility]   Surgeons: Ileana Roup, MD       DISCUSSION:60 y.o. never smoker with h/o HTN, DM II, GERD, a-fib, colon cancer scheduled for above procedure 09/30/2020 with Dr. Nadeen Landau.   Pt seen by cardiology 09/01/20. Per OV note, "Patient has pending colon surgery due to colon mass as noted above in HPI.  Mass was recently discovered on a routine colonoscopy.  His revised cardiac risk index equals 1 placing him at a perioperative risk of major cardiac event at 0.9%.  His Duke activity status index score is 50.7 giving him a functional capacity and METS at 8.9.  From a cardiac standpoint he is cleared to undergo colon resection under general anesthesia."  Anticipate pt can proceed with planned procedure barring acute status change.   VS: BP (!) 144/83   Pulse (!) 57   Temp 36.9 C (Oral)   Resp 16   Ht '6\' 8"'$  (2.032 m)   Wt (!) 136.3 kg   SpO2 100%   BMI 33.00 kg/m   PROVIDERS: Caryl Bis, MD is PCP   Quay Burow, MD is Cardiologist  LABS: Labs reviewed: Acceptable for surgery. (all labs ordered are listed, but only abnormal results are displayed)  Labs Reviewed  HEMOGLOBIN A1C - Abnormal; Notable for the following components:      Result Value   Hgb A1c MFr Bld 6.3 (*)    All other components within normal limits  CBC WITH DIFFERENTIAL/PLATELET - Abnormal; Notable for the following components:   RBC 3.93 (*)    Hemoglobin 10.1 (*)    HCT 33.2 (*)    MCH 25.7 (*)    All other components within normal limits  COMPREHENSIVE METABOLIC PANEL - Abnormal; Notable for the following components:   Glucose, Bld 116 (*)    Creatinine, Ser 1.26 (*)    All other components within normal limits  GLUCOSE, CAPILLARY - Abnormal; Notable for the following components:    Glucose-Capillary 121 (*)    All other components within normal limits  TYPE AND SCREEN     IMAGES:   EKG: 09/01/20 Rate 56 bpm  Sinus bradycardia   CV: Echo 09/11/2018  1. The left ventricle has normal systolic function, with an ejection  fraction of 55-60%. The cavity size was normal.   2. Left atrial size was Left atrium is enlarged.   3. No evidence of intracardiac thrombus. Normal LA appendage without  thrombus, normal emptying velocity 70cm/s.   4. Mitral valve regurgitation is mild to moderate by color flow Doppler.   5. The aortic valve is tricuspid No stenosis of the aortic valve.   6. The aortic root is normal in size and structure.  Past Medical History:  Diagnosis Date   A-fib [facility])    DCCV 2009, 2012, 2014, Nov 2016   Anxiety    Arthritis    Knees, wrist, ankles   Cancer ([place]) 07/2019   Kidney   Diabetes mellitus without complication ([place])    Dysrhythmia 1996   A- Fib   GERD (gastroesophageal reflux disease)    History of kidney stones    HX: anticoagulation    very remotely and briefly on COumadin aound one of his CV, 2014 on Eliquis had hematuria with renal calculi and stopped  Hyperlipidemia    patient denies this   Hypertension    IBS (irritable bowel syndrome)    Normal cardiac stress test    Normal nuclear stress test , 2001    Past Surgical History:  Procedure Laterality Date   BIOPSY  08/19/2020   Procedure: BIOPSY;  Surgeon: Harvel Quale, MD;  [facility];  Service: Gastroenterology;;   CARDIOVERSION N/A 10/15/2012   Procedure: CARDIOVERSION;  Surgeon: Larey Dresser, MD;  Location: [facility] ENDOSCOPY;  Service: Cardiovascular;  Laterality: N/A;   CARDIOVERSION N/A 11/26/2014   Procedure: CARDIOVERSION;  Surgeon: Arnoldo Lenis, MD;  Location: [facility];  Service: Endoscopy;  Laterality: N/A;   CARDIOVERSION N/A 11/15/2015   Procedure: CARDIOVERSION;  Surgeon: Jerline Pain, MD;  Location: [place];  Service:  Cardiovascular;  Laterality: N/A;   CARDIOVERSION N/A 08/07/2016   Procedure: CARDIOVERSION;  Surgeon: Herminio Commons, MD;  Location[facility];  Service: Cardiovascular;  Laterality: N/A;   CARDIOVERSION N/A 09/11/2018   Procedure: CARDIOVERSION;  Surgeon: Arnoldo Lenis, MD;  Location[facility];  Service: Endoscopy;  Laterality: N/A;   CATARACT EXTRACTION W/PHACO Right 01/18/2014   Procedure: CATARACT EXTRACTION PHACO AND INTRAOCULAR LENS PLACEMENT (IOC);  Surgeon: Tonny Branch, MD;  Location[facility];  Service: Ophthalmology;  Laterality: Right;  CDE 9.95   COLONOSCOPY WITH PROPOFOL N/A 08/19/2020   Procedure: COLONOSCOPY WITH PROPOFOL;  Surgeon: Harvel Quale, MD;  [facility];  Service: Gastroenterology;  Laterality: N/A;  8:20   CYSTOSCOPY WITH LITHOLAPAXY N/A 10/06/2019   Procedure: CYSTOSCOPY WITH LASER LITHOLAPAXY;  Surgeon: Ceasar Mons, MD;  [facility];  Service: Urology;  Laterality: N/A;   ESOPHAGOGASTRODUODENOSCOPY (EGD) WITH PROPOFOL N/A 08/19/2020   Procedure: ESOPHAGOGASTRODUODENOSCOPY (EGD) WITH PROPOFOL;  Surgeon: Harvel Quale, MD;  [facility];  Service: Gastroenterology;  Laterality: N/A;   EYE SURGERY Right    detached retina   FOOT SURGERY Left    KNEE ARTHROSCOPY Right    LITHOTRIPSY     for kidney stones 2002   POLYPECTOMY  08/19/2020   Procedure: POLYPECTOMY INTESTINAL;  Surgeon: Harvel Quale, MD;  [facility];  Service: Gastroenterology;;   POLYPECTOMY  08/19/2020   Procedure: POLYPECTOMY;  Surgeon: Harvel Quale, MD;  [facility];  Service: Gastroenterology;;   ROBOT ASSISTED LAPAROSCOPIC NEPHRECTOMY Left 10/06/2019   Procedure: XI ROBOTIC ASSISTED LAPAROSCOPIC NEPHRECTOMY WITH ADRENALECTOMY;  Surgeon: Ceasar Mons, MD;  [facility];  Service: Urology;  Laterality: Left;   SUBMUCOSAL TATTOO INJECTION  08/19/2020   Procedure: SUBMUCOSAL  TATTOO INJECTION;  Surgeon: Harvel Quale, MD;  [facility];  Service: Gastroenterology;;   TEE WITHOUT CARDIOVERSION N/A 11/26/2014   Procedure: TRANSESOPHAGEAL ECHOCARDIOGRAM (TEE) WITH PROPOFOL;  Surgeon: Arnoldo Lenis, MD;  [facility];  Service: Endoscopy;  Laterality: N/A;   TEE WITHOUT CARDIOVERSION N/A 11/15/2015   Procedure: TRANSESOPHAGEAL ECHOCARDIOGRAM (TEE);  Surgeon: Jerline Pain, MD;  Location: [place];  Service: Cardiovascular;  Laterality: N/A;   TEE WITHOUT CARDIOVERSION N/A 09/11/2018   Procedure: TRANSESOPHAGEAL ECHOCARDIOGRAM (TEE) WITH PROPOFOL;  Surgeon: Arnoldo Lenis, MD;  Location[facility];  Service: Endoscopy;  Laterality: N/A;   VASECTOMY      MEDICATIONS:  amLODipine (NORVASC) 5 MG tablet   atorvastatin (LIPITOR) 20 MG tablet   Bioflavonoid Products (ESTER C PO)   Calcium Citrate-Vitamin D (CITRACAL + D PO)   cholecalciferol (VITAMIN D3) 25 MCG (  1000 UNIT) tablet   iron polysaccharides (NIFEREX) 150 MG capsule   losartan (COZAAR) 100 MG tablet   metoprolol tartrate (LOPRESSOR) 100 MG tablet   omeprazole (PRILOSEC) 40 MG capsule   OVER THE COUNTER MEDICATION   Semaglutide (OZEMPIC, 0.25 OR 0.5 MG/DOSE, [place])   Vitamin D, Ergocalciferol, (DRISDOL) 50000 UNITS CAPS capsule   zinc gluconate 50 MG tablet   No current facility-administered medications for this encounter.    Konrad Felix Ward, PA-C [facility]Surgical Testing [phone]

## 2020-09-26 ENCOUNTER — Other Ambulatory Visit (INDEPENDENT_AMBULATORY_CARE_PROVIDER_SITE_OTHER): Payer: Self-pay | Admitting: Gastroenterology

## 2020-09-28 ENCOUNTER — Other Ambulatory Visit: Payer: Self-pay | Admitting: Surgery

## 2020-09-28 LAB — SARS CORONAVIRUS 2 (TAT 6-24 HRS): SARS Coronavirus 2: NEGATIVE

## 2020-09-29 ENCOUNTER — Ambulatory Visit (HOSPITAL_BASED_OUTPATIENT_CLINIC_OR_DEPARTMENT_OTHER): Payer: Medicare HMO | Admitting: Genetic Counselor

## 2020-09-29 ENCOUNTER — Encounter (HOSPITAL_COMMUNITY): Payer: Self-pay | Admitting: Genetic Counselor

## 2020-09-29 DIAGNOSIS — C642 Malignant neoplasm of left kidney, except renal pelvis: Secondary | ICD-10-CM | POA: Diagnosis not present

## 2020-09-29 DIAGNOSIS — Z800 Family history of malignant neoplasm of digestive organs: Secondary | ICD-10-CM

## 2020-09-29 DIAGNOSIS — C182 Malignant neoplasm of ascending colon: Secondary | ICD-10-CM

## 2020-09-29 NOTE — Progress Notes (Signed)
REFERRING PROVIDER: Derek Jack, MD [phone], [place] [address]  PRIMARY PROVIDER:  Caryl Bis, MD  PRIMARY REASON FOR VISIT:  1. Cancer of ascending colon ([place])   2. Renal cell cancer, left ([place])   3. Family history of colon cancer    I connected with Bradley Rodriguez on 09/29/2020 at 9:00AM EDT by Webex video conference and verified that I am speaking with the correct person using two identifiers.   Patient location: [address] [place], [place] [address] Provider location: [facility]  HISTORY OF PRESENT ILLNESS:   Bradley Rodriguez, a 60 y.o. male, was seen for a [place] cancer genetics consultation at the request of Dr. Dirk Dress, due to a personal and family history of cancer. Bradley Rodriguez presents to clinic today to discuss the possibility of a hereditary predisposition to cancer, to discuss genetic testing, and to further clarify his future cancer risks, as well as potential cancer risks for family members.   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 He had a left nephrectomy. In 2022, at the age of 65, Bradley Rodriguez was diagnosed with colon cancer.   CANCER HISTORY:  Bradley Rodriguez was diagnosed with renal cell carcinoma at age 35 and colon cancer at age 39.  RISK FACTORS:  Bradley Rodriguez reports no history of elevated PSA levels. He worked at [employer] where he reports he may have been exposed to chemicals. He denies smoking history.   Past Medical History:  Diagnosis Date   A-fib [facility])    DCCV 2009, 2012, 2014, Nov 2016   Anxiety    Arthritis    Knees, wrist, ankles   Cancer ([place]) 07/2019   Kidney   Diabetes mellitus without complication ([place])    Dysrhythmia 1996   A- Fib   GERD (gastroesophageal reflux disease)    History of kidney stones    HX: anticoagulation    very remotely and briefly on COumadin aound one of his CV, 2014 on Eliquis had hematuria with renal calculi and stopped    Hyperlipidemia    patient denies this   Hypertension    IBS (irritable bowel syndrome)    Normal cardiac stress test    Normal nuclear stress test , 2001    Past Surgical History:  Procedure Laterality Date   BIOPSY  08/19/2020   Procedure: BIOPSY;  Surgeon: Harvel Quale, MD;  [facility];  Service: Gastroenterology;;   CARDIOVERSION N/A 10/15/2012   Procedure: CARDIOVERSION;  Surgeon: Larey Dresser, MD;  Location: [facility] ENDOSCOPY;  Service: Cardiovascular;  Laterality: N/A;   CARDIOVERSION N/A 11/26/2014   Procedure: CARDIOVERSION;  Surgeon: Arnoldo Lenis, MD;  Location: [facility];  Service: Endoscopy;  Laterality: N/A;   CARDIOVERSION N/A 11/15/2015   Procedure: CARDIOVERSION;  Surgeon: Jerline Pain, MD;  Location: [place];  Service: Cardiovascular;  Laterality: N/A;   CARDIOVERSION N/A 08/07/2016   Procedure: CARDIOVERSION;  Surgeon: Herminio Commons, MD;  Location: [facility];  Service: Cardiovascular;  Laterality: N/A;   CARDIOVERSION N/A 09/11/2018   Procedure: CARDIOVERSION;  Surgeon: Arnoldo Lenis, MD;  [facility]: [facility];  Service: Endoscopy;  Laterality: N/A;   CATARACT EXTRACTION W/PHACO Right 01/18/2014   Procedure: CATARACT EXTRACTION PHACO AND INTRAOCULAR LENS PLACEMENT (IOC);  Surgeon: Tonny Branch, MD;  [facility]: [facility];  Service: Ophthalmology;  Laterality: Right;  CDE 9.95   COLONOSCOPY WITH PROPOFOL N/A 08/19/2020  Procedure: COLONOSCOPY WITH PROPOFOL;  Surgeon: Harvel Quale, MD;  [facility];  Service: Gastroenterology;  Laterality: N/A;  8:20   CYSTOSCOPY WITH LITHOLAPAXY N/A 10/06/2019   Procedure: CYSTOSCOPY WITH LASER LITHOLAPAXY;  Surgeon: Ceasar Mons, MD;  Location: [facility];  Service: Urology;  Laterality: N/A;   ESOPHAGOGASTRODUODENOSCOPY (EGD) WITH PROPOFOL N/A 08/19/2020   Procedure: ESOPHAGOGASTRODUODENOSCOPY (EGD) WITH PROPOFOL;  Surgeon: Harvel Quale, MD;  [facility];   Service: Gastroenterology;  Laterality: N/A;   EYE SURGERY Right    detached retina   FOOT SURGERY Left    KNEE ARTHROSCOPY Right    LITHOTRIPSY     for kidney stones 2002   POLYPECTOMY  08/19/2020   Procedure: POLYPECTOMY INTESTINAL;  Surgeon: Harvel Quale, MD;  [facility];  Service: Gastroenterology;;   POLYPECTOMY  08/19/2020   Procedure: POLYPECTOMY;  Surgeon: Harvel Quale, MD;  [facility];  Service: Gastroenterology;;   ROBOT ASSISTED LAPAROSCOPIC NEPHRECTOMY Left 10/06/2019   Procedure: XI ROBOTIC ASSISTED LAPAROSCOPIC NEPHRECTOMY WITH ADRENALECTOMY;  Surgeon: Ceasar Mons, MD;  [facility];  Service: Urology;  Laterality: Left;   SUBMUCOSAL TATTOO INJECTION  08/19/2020   Procedure: SUBMUCOSAL TATTOO INJECTION;  Surgeon: Harvel Quale, MD;  [facility];  Service: Gastroenterology;;   TEE WITHOUT CARDIOVERSION N/A 11/26/2014   Procedure: TRANSESOPHAGEAL ECHOCARDIOGRAM (TEE) WITH PROPOFOL;  Surgeon: Arnoldo Lenis, MD;  [facility];  Service: Endoscopy;  Laterality: N/A;   TEE WITHOUT CARDIOVERSION N/A 11/15/2015   Procedure: TRANSESOPHAGEAL ECHOCARDIOGRAM (TEE);  Surgeon: Jerline Pain, MD;  Location: [place];  Service: Cardiovascular;  Laterality: N/A;   TEE WITHOUT CARDIOVERSION N/A 09/11/2018   Procedure: TRANSESOPHAGEAL ECHOCARDIOGRAM (TEE) WITH PROPOFOL;  Surgeon: Arnoldo Lenis, MD;  [facility];  Service: Endoscopy;  Laterality: N/A;   VASECTOMY      Social History   Socioeconomic History   Marital status: Married    Spouse name: Not on file   Number of children: Not on file   Years of education: Not on file   Highest education level: Not on file  Occupational History   Not on file  Tobacco Use   Smoking status: Never   Smokeless tobacco: Never  Vaping Use   Vaping Use: Never used  Substance and Sexual Activity   Alcohol use: No    Alcohol/week: 0.0  standard drinks   Drug use: No   Sexual activity: Yes    Birth control/protection: Surgical  Other Topics Concern   Not on file  Social History Narrative   Not on file   Social Determinants of Health   Financial Resource Strain: Low Risk    Difficulty of Paying Living Expenses: Not hard at all  Food Insecurity: No Food Insecurity   Worried About Charity fundraiser in the Last Year: Never true   [place] in the Last Year: Never true  Transportation Needs: No Transportation Needs   Lack of Transportation (Medical): No   Lack of Transportation (Non-Medical): No  Physical Activity: Inactive   Days of Exercise per Week: 0 days   Minutes of Exercise per Session: 0 min  Stress: No Stress Concern Present   Feeling of Stress : Not at all  Social Connections: Moderately Integrated   Frequency of Communication with Friends and Family: More than three times a week   Frequency of Social Gatherings with Friends and Family: More than three times a  week   Attends Religious Services: More than 4 times per year   Active Member of Clubs or Organizations: No   Attends Archivist Meetings: Never   Marital Status: Married     FAMILY HISTORY:  We obtained a detailed, 4-generation family history.  Significant diagnoses are listed below: Family History  Problem Relation Age of Onset   Colon cancer Mother        dx. 42s   Colon cancer Father        dx. 15s   Lung cancer Maternal Grandmother        smoked   Bone cancer Maternal Grandfather        dx. 80s    Bradley Rodriguez is unaware of previous family history of genetic testing for hereditary cancer risks. There is no reported Ashkenazi Jewish ancestry.     Bradley Rodriguez mother was diagnosed with colon cancer in her 47s, she is deceased. Her treatment only consisted of a colon resection. She did not have radiation or chemotherapy. His maternal grandmother was diagnosed with lung cancer, she smoked, she is deceased. His maternal  grandfather was diagnosed with bone cancer in his 15s, he is deceased. Bradley Rodriguez father was diagnosed with colon cancer in his 23s, he is deceased. His treatment only consisted of a colon resection. He did not have radiation or chemotherapy.  GENETIC COUNSELING ASSESSMENT: Bradley Rodriguez is a 60 y.o. male with a personal and family history of colon cancer which is somewhat suggestive of a hereditary predisposition to cancer. We, therefore, discussed and recommended the following at today's visit.   DISCUSSION: We discussed that 5 - 10% of cancer is hereditary, with most cases of hereditary colon cancer associated with Lynch Syndrome.  There are other genes that can be associated with hereditary colon and kidney cancer syndromes.  We discussed that testing is beneficial for several reasons, including knowing about other cancer risks, identifying potential screening and risk-reduction options that may be appropriate, and to understanding if other family members could be at risk for cancer and allowing them to undergo genetic testing.  We reviewed the characteristics, features and inheritance patterns of hereditary cancer syndromes. We also discussed genetic testing, including the appropriate family members to test, the process of testing, insurance coverage and turn-around-time for results. We discussed the implications of a negative, positive, carrier and/or variant of uncertain significant result.   Bradley Rodriguez was offered a common hereditary cancer panel+BAP1, FH, FLCN, MET, and MITF (52 genes) and an expanded pan-cancer panel (84 genes). Bradley Rodriguez was informed of the benefits and limitations of each panel, including that expanded pan-cancer panels contain several genes that do not have clear management guidelines at this point in time.  We also discussed that as the number of genes included on a panel increases, the chances of variants of uncertain significance increases.   Based on Bradley Rodriguez personal and  family history of cancer, he does not meet medical criteria for genetic testing. He may have an out of pocket cost. We discussed that if his out of pocket cost for testing is over $100, the laboratory will call and confirm whether he wants to proceed with testing.  If the out of pocket cost of testing is less than $100 he will be billed by the genetic testing laboratory.   PLAN: After considering the risks, benefits, and limitations, Bradley Rodriguez provided informed consent to pursue genetic testing through [facility] for analysis of the Common Cancer Panel+BAP1, FH, FLCN,  MET, and MITF (52 genes). He will have his blood drawn during his next hospital visit on 10/13/2020. The Common Hereditary Cancers Panel offered by [facility] includes sequencing and/or deletion duplication testing of the following 47 genes: APC, ATM, AXIN2, BARD1, BMPR1A, BRCA1, BRCA2, BRIP1, CDH1, CDK4, CDKN2A (p14ARF), CDKN2A (p16INK4a), CHEK2, CTNNA1, DICER1, EPCAM (Deletion/duplication testing only), GREM1 (promoter region deletion/duplication testing only), GREM1, HOXB13, KIT, MEN1, MLH1, MSH2, MSH3, MSH6, MUTYH, NBN, NF1, NHTL1, PALB2, PDGFRA, PMS2, POLD1, POLE, PTEN, RAD50, RAD51C, RAD51D, RNF43, SDHA, SDHB, SDHC, SDHD, SMAD4, SMARCA4. STK11, TP53, TSC1, TSC2, and VHL.  The following genes were evaluated for sequence changes only: SDHA and HOXB13 c.251G>A variant only.es only: SDHA and HOXB13 c.251G>A variant only. We added on the BAP1, FH, FLCN, MET, and MITF genes which are associated with kidney cancer.  Results should be available within approximately 2-3 weeks' time, at which point they will be disclosed by telephone to Mr. Saini, as will any additional recommendations warranted by these results. Bradley Rodriguez will receive a summary of his genetic counseling visit and a copy of his results once available. This information will also be available in Epic.   Mr. Demery questions were answered to his satisfaction today. Our contact  information was provided should additional questions or concerns arise. Thank you for the referral and allowing [name] to share in the care of your patient.   Lucille Passy, MS, [facility] Genetic Counselor [email or web address] (P) [phone]   The patient was seen for a total of 30 minutes in a MyChart video genetic counseling visit. The patient was seen alone.  Drs. Magrinat, Lindi Adie and/or Burr Medico were available to discuss this case as needed.  _______________________________________________________________________ For Office Staff:  Number of people involved in session: 1 Was an Intern/ student involved with case: no

## 2020-09-30 ENCOUNTER — Inpatient Hospital Stay (HOSPITAL_COMMUNITY)
Admission: RE | Admit: 2020-09-30 | Discharge: 2020-10-02 | DRG: 331 | Disposition: A | Discharge status: Home / Self Care | Payer: Medicare HMO | Attending: Surgery | Admitting: Surgery

## 2020-09-30 ENCOUNTER — Inpatient Hospital Stay (HOSPITAL_COMMUNITY): Payer: Medicare HMO | Admitting: Anesthesiology

## 2020-09-30 ENCOUNTER — Other Ambulatory Visit: Payer: Self-pay

## 2020-09-30 ENCOUNTER — Encounter (HOSPITAL_COMMUNITY): Payer: Self-pay | Admitting: Surgery

## 2020-09-30 ENCOUNTER — Encounter (HOSPITAL_COMMUNITY)
Admission: RE | Disposition: A | Discharge status: Home / Self Care | Payer: Self-pay | Source: Home / Self Care | Attending: Surgery

## 2020-09-30 ENCOUNTER — Inpatient Hospital Stay (HOSPITAL_COMMUNITY): Payer: Medicare HMO | Admitting: Physician Assistant

## 2020-09-30 DIAGNOSIS — Z8249 Family history of ischemic heart disease and other diseases of the circulatory system: Secondary | ICD-10-CM | POA: Diagnosis not present

## 2020-09-30 DIAGNOSIS — I480 Paroxysmal atrial fibrillation: Secondary | ICD-10-CM | POA: Diagnosis present

## 2020-09-30 DIAGNOSIS — C183 Malignant neoplasm of hepatic flexure: Secondary | ICD-10-CM | POA: Diagnosis not present

## 2020-09-30 DIAGNOSIS — D509 Iron deficiency anemia, unspecified: Secondary | ICD-10-CM | POA: Diagnosis not present

## 2020-09-30 DIAGNOSIS — Z888 Allergy status to other drugs, medicaments and biological substances status: Secondary | ICD-10-CM

## 2020-09-30 DIAGNOSIS — E785 Hyperlipidemia, unspecified: Secondary | ICD-10-CM | POA: Diagnosis present

## 2020-09-30 DIAGNOSIS — C189 Malignant neoplasm of colon, unspecified: Secondary | ICD-10-CM | POA: Diagnosis present

## 2020-09-30 DIAGNOSIS — Z833 Family history of diabetes mellitus: Secondary | ICD-10-CM

## 2020-09-30 DIAGNOSIS — Z800 Family history of malignant neoplasm of digestive organs: Secondary | ICD-10-CM

## 2020-09-30 DIAGNOSIS — K219 Gastro-esophageal reflux disease without esophagitis: Secondary | ICD-10-CM | POA: Diagnosis not present

## 2020-09-30 DIAGNOSIS — Z7984 Long term (current) use of oral hypoglycemic drugs: Secondary | ICD-10-CM | POA: Diagnosis not present

## 2020-09-30 DIAGNOSIS — I10 Essential (primary) hypertension: Secondary | ICD-10-CM | POA: Diagnosis present

## 2020-09-30 DIAGNOSIS — Z905 Acquired absence of kidney: Secondary | ICD-10-CM | POA: Diagnosis not present

## 2020-09-30 DIAGNOSIS — Z85528 Personal history of other malignant neoplasm of kidney: Secondary | ICD-10-CM

## 2020-09-30 DIAGNOSIS — C182 Malignant neoplasm of ascending colon: Secondary | ICD-10-CM | POA: Diagnosis not present

## 2020-09-30 DIAGNOSIS — D175 Benign lipomatous neoplasm of intra-abdominal organs: Secondary | ICD-10-CM | POA: Diagnosis not present

## 2020-09-30 DIAGNOSIS — E119 Type 2 diabetes mellitus without complications: Secondary | ICD-10-CM | POA: Diagnosis present

## 2020-09-30 DIAGNOSIS — C772 Secondary and unspecified malignant neoplasm of intra-abdominal lymph nodes: Secondary | ICD-10-CM | POA: Diagnosis not present

## 2020-09-30 HISTORY — PX: LAPAROSCOPIC RIGHT HEMI COLECTOMY: SHX5926

## 2020-09-30 LAB — GLUCOSE, CAPILLARY
Glucose-Capillary: 123 mg/dL — ABNORMAL HIGH (ref 70–99)
Glucose-Capillary: 193 mg/dL — ABNORMAL HIGH (ref 70–99)
Glucose-Capillary: 153 mg/dL — ABNORMAL HIGH (ref 70–99)
Glucose-Capillary: 251 mg/dL — ABNORMAL HIGH (ref 70–99)

## 2020-09-30 LAB — TYPE AND SCREEN
ABO/RH(D): O POS
Antibody Screen: NEGATIVE

## 2020-09-30 SURGERY — LAPAROSCOPIC RIGHT HEMI COLECTOMY
Anesthesia: General | Site: Abdomen

## 2020-09-30 MED ORDER — DIPHENHYDRAMINE HCL 12.5 MG/5ML PO ELIX: 12.5000 mg | ORAL_SOLUTION | Freq: Four times a day (QID) | ORAL | Status: DC | PRN

## 2020-09-30 MED ORDER — ORAL CARE MOUTH RINSE: 15.0000 mL | Freq: Once | OROMUCOSAL | Status: AC

## 2020-09-30 MED ORDER — CHLORHEXIDINE GLUCONATE 0.12 % MT SOLN
15.0000 mL | Freq: Once | OROMUCOSAL | Status: AC
  Administered 2020-09-30: 15 mL via OROMUCOSAL

## 2020-09-30 MED ORDER — ACETAMINOPHEN 500 MG PO TABS
1000.0000 mg | ORAL_TABLET | Freq: Four times a day (QID) | ORAL | Status: DC
  Administered 2020-09-30 – 2020-10-02 (×5): 1000 mg via ORAL
  Filled 2020-09-30 (×5): qty 2

## 2020-09-30 MED ORDER — ACETAMINOPHEN 500 MG PO TABS
1000.0000 mg | ORAL_TABLET | ORAL | Status: AC
  Administered 2020-09-30: 1000 mg via ORAL
  Filled 2020-09-30: qty 2

## 2020-09-30 MED ORDER — PROMETHAZINE HCL 25 MG/ML IJ SOLN: 6.2500 mg | INTRAMUSCULAR | Status: DC | PRN

## 2020-09-30 MED ORDER — CHLORHEXIDINE GLUCONATE CLOTH 2 % EX PADS: 6.0000 | MEDICATED_PAD | Freq: Every day | CUTANEOUS | Status: DC

## 2020-09-30 MED ORDER — EPHEDRINE SULFATE-NACL 50-0.9 MG/10ML-% IV SOSY
PREFILLED_SYRINGE | INTRAVENOUS | Status: DC | PRN
  Administered 2020-09-30: 5 mg via INTRAVENOUS
  Administered 2020-09-30: 10 mg via INTRAVENOUS

## 2020-09-30 MED ORDER — HEPARIN SODIUM (PORCINE) 5000 UNIT/ML IJ SOLN
5000.0000 [IU] | Freq: Three times a day (TID) | INTRAMUSCULAR | Status: DC
  Administered 2020-09-30 – 2020-10-02 (×6): 5000 [IU] via SUBCUTANEOUS
  Filled 2020-09-30 (×6): qty 1

## 2020-09-30 MED ORDER — SODIUM CHLORIDE 0.9 % IV SOLN
2.0000 g | INTRAVENOUS | Status: AC
  Administered 2020-09-30: 2 g via INTRAVENOUS
  Filled 2020-09-30: qty 2

## 2020-09-30 MED ORDER — METOPROLOL TARTRATE 50 MG PO TABS
100.0000 mg | ORAL_TABLET | Freq: Two times a day (BID) | ORAL | Status: DC
  Administered 2020-10-01 (×2): 100 mg via ORAL
  Filled 2020-09-30 (×2): qty 2

## 2020-09-30 MED ORDER — ENSURE PRE-SURGERY PO LIQD
592.0000 mL | Freq: Once | ORAL | Status: DC
  Filled 2020-09-30: qty 592

## 2020-09-30 MED ORDER — KETAMINE HCL 10 MG/ML IJ SOLN
INTRAMUSCULAR | Status: AC
  Filled 2020-09-30: qty 1

## 2020-09-30 MED ORDER — LACTATED RINGERS IV SOLN: INTRAVENOUS | Status: DC | PRN

## 2020-09-30 MED ORDER — CHLORHEXIDINE GLUCONATE CLOTH 2 % EX PADS: 6.0000 | MEDICATED_PAD | Freq: Once | CUTANEOUS | Status: DC

## 2020-09-30 MED ORDER — AMLODIPINE BESYLATE 5 MG PO TABS
5.0000 mg | ORAL_TABLET | Freq: Every evening | ORAL | Status: DC
  Administered 2020-09-30 – 2020-10-01 (×2): 5 mg via ORAL
  Filled 2020-09-30 (×2): qty 1

## 2020-09-30 MED ORDER — IBUPROFEN 400 MG PO TABS: 600.0000 mg | ORAL_TABLET | Freq: Four times a day (QID) | ORAL | Status: DC | PRN

## 2020-09-30 MED ORDER — ALVIMOPAN 12 MG PO CAPS
12.0000 mg | ORAL_CAPSULE | ORAL | Status: AC
  Administered 2020-09-30: 12 mg via ORAL
  Filled 2020-09-30: qty 1

## 2020-09-30 MED ORDER — FENTANYL CITRATE PF 50 MCG/ML IJ SOSY: 25.0000 ug | PREFILLED_SYRINGE | INTRAMUSCULAR | Status: DC | PRN

## 2020-09-30 MED ORDER — TRAMADOL HCL 50 MG PO TABS
50.0000 mg | ORAL_TABLET | Freq: Four times a day (QID) | ORAL | Status: DC | PRN
  Administered 2020-09-30: 50 mg via ORAL
  Filled 2020-09-30: qty 1

## 2020-09-30 MED ORDER — FENTANYL CITRATE (PF) 250 MCG/5ML IJ SOLN
INTRAMUSCULAR | Status: AC
  Filled 2020-09-30: qty 5

## 2020-09-30 MED ORDER — INSULIN ASPART 100 UNIT/ML IJ SOLN
0.0000 [IU] | INTRAMUSCULAR | Status: DC
  Administered 2020-10-01: 2 [IU] via SUBCUTANEOUS
  Administered 2020-10-01: 5 [IU] via SUBCUTANEOUS
  Administered 2020-10-01 (×2): 3 [IU] via SUBCUTANEOUS
  Administered 2020-10-01: 2 [IU] via SUBCUTANEOUS
  Administered 2020-10-01: 3 [IU] via SUBCUTANEOUS

## 2020-09-30 MED ORDER — VITAMIN D (ERGOCALCIFEROL) 1.25 MG (50000 UNIT) PO CAPS: 50000.0000 [IU] | ORAL_CAPSULE | ORAL | Status: DC

## 2020-09-30 MED ORDER — ONDANSETRON HCL 4 MG/2ML IJ SOLN
INTRAMUSCULAR | Status: AC
  Filled 2020-09-30: qty 2

## 2020-09-30 MED ORDER — LIDOCAINE 2% (20 MG/ML) 5 ML SYRINGE
INTRAMUSCULAR | Status: AC
  Filled 2020-09-30: qty 5

## 2020-09-30 MED ORDER — ALVIMOPAN 12 MG PO CAPS
12.0000 mg | ORAL_CAPSULE | Freq: Two times a day (BID) | ORAL | Status: DC
  Administered 2020-10-01: 12 mg via ORAL
  Filled 2020-09-30: qty 1

## 2020-09-30 MED ORDER — PROPOFOL 10 MG/ML IV BOLUS
INTRAVENOUS | Status: DC | PRN
  Administered 2020-09-30: 200 mg via INTRAVENOUS

## 2020-09-30 MED ORDER — BISACODYL 5 MG PO TBEC: 20.0000 mg | DELAYED_RELEASE_TABLET | Freq: Once | ORAL | Status: DC

## 2020-09-30 MED ORDER — BUPIVACAINE LIPOSOME 1.3 % IJ SUSP
INTRAMUSCULAR | Status: AC
  Filled 2020-09-30: qty 20

## 2020-09-30 MED ORDER — ENSURE PRE-SURGERY PO LIQD
296.0000 mL | Freq: Once | ORAL | Status: DC
  Filled 2020-09-30: qty 296

## 2020-09-30 MED ORDER — TRAMADOL HCL 50 MG PO TABS: 50.0000 mg | ORAL_TABLET | Freq: Four times a day (QID) | ORAL | 0 refills | Status: AC | PRN

## 2020-09-30 MED ORDER — POLYETHYLENE GLYCOL 3350 17 GM/SCOOP PO POWD: 1.0000 | Freq: Once | ORAL | Status: DC

## 2020-09-30 MED ORDER — DEXAMETHASONE SODIUM PHOSPHATE 4 MG/ML IJ SOLN
INTRAMUSCULAR | Status: DC | PRN
  Administered 2020-09-30: 10 mg via INTRAVENOUS

## 2020-09-30 MED ORDER — BUPIVACAINE-EPINEPHRINE (PF) 0.5% -1:200000 IJ SOLN
INTRAMUSCULAR | Status: AC
  Filled 2020-09-30: qty 30

## 2020-09-30 MED ORDER — METRONIDAZOLE 500 MG PO TABS: 1000.0000 mg | ORAL_TABLET | ORAL | Status: DC

## 2020-09-30 MED ORDER — ENSURE SURGERY PO LIQD: 237.0000 mL | Freq: Two times a day (BID) | ORAL | Status: DC

## 2020-09-30 MED ORDER — BUPIVACAINE LIPOSOME 1.3 % IJ SUSP
INTRAMUSCULAR | Status: DC | PRN
  Administered 2020-09-30: 20 mL

## 2020-09-30 MED ORDER — ROCURONIUM BROMIDE 10 MG/ML (PF) SYRINGE
PREFILLED_SYRINGE | INTRAVENOUS | Status: DC | PRN
  Administered 2020-09-30 (×2): 30 mg via INTRAVENOUS
  Administered 2020-09-30: 70 mg via INTRAVENOUS

## 2020-09-30 MED ORDER — LACTATED RINGERS IR SOLN
Status: DC | PRN
  Administered 2020-09-30: 1000 mL

## 2020-09-30 MED ORDER — BUPIVACAINE-EPINEPHRINE 0.25% -1:200000 IJ SOLN
INTRAMUSCULAR | Status: DC | PRN
  Administered 2020-09-30: 30 mL

## 2020-09-30 MED ORDER — LIDOCAINE HCL (PF) 2 % IJ SOLN
INTRAMUSCULAR | Status: DC | PRN
Start: 2020-09-30 — End: 2020-09-30
  Administered 2020-09-30: 1.5 mg/kg/h via INTRADERMAL

## 2020-09-30 MED ORDER — ONDANSETRON HCL 4 MG/2ML IJ SOLN
INTRAMUSCULAR | Status: DC | PRN
  Administered 2020-09-30: 4 mg via INTRAVENOUS

## 2020-09-30 MED ORDER — VITAMIN D 25 MCG (1000 UNIT) PO TABS
1000.0000 [IU] | ORAL_TABLET | Freq: Every day | ORAL | Status: DC
  Administered 2020-09-30 – 2020-10-01 (×2): 1000 [IU] via ORAL
  Filled 2020-09-30: qty 1

## 2020-09-30 MED ORDER — BUPIVACAINE-EPINEPHRINE (PF) 0.25% -1:200000 IJ SOLN
INTRAMUSCULAR | Status: AC
  Filled 2020-09-30: qty 30

## 2020-09-30 MED ORDER — ROCURONIUM BROMIDE 10 MG/ML (PF) SYRINGE
PREFILLED_SYRINGE | INTRAVENOUS | Status: AC
  Filled 2020-09-30: qty 10

## 2020-09-30 MED ORDER — LOSARTAN POTASSIUM 50 MG PO TABS
100.0000 mg | ORAL_TABLET | Freq: Every evening | ORAL | Status: DC
  Administered 2020-09-30 – 2020-10-01 (×2): 100 mg via ORAL
  Filled 2020-09-30 (×2): qty 2

## 2020-09-30 MED ORDER — SODIUM CHLORIDE 0.9 % IR SOLN
Status: DC | PRN
  Administered 2020-09-30: 2000 mL

## 2020-09-30 MED ORDER — HYDRALAZINE HCL 20 MG/ML IJ SOLN
10.0000 mg | INTRAMUSCULAR | Status: DC | PRN
Start: 2020-09-30 — End: 2020-10-02

## 2020-09-30 MED ORDER — SUGAMMADEX SODIUM 200 MG/2ML IV SOLN: INTRAVENOUS | Status: DC | PRN

## 2020-09-30 MED ORDER — DEXAMETHASONE SODIUM PHOSPHATE 10 MG/ML IJ SOLN
INTRAMUSCULAR | Status: AC
  Filled 2020-09-30: qty 1

## 2020-09-30 MED ORDER — BUPIVACAINE LIPOSOME 1.3 % IJ SUSP: 20.0000 mL | Freq: Once | INTRAMUSCULAR | Status: DC

## 2020-09-30 MED ORDER — ONDANSETRON HCL 4 MG PO TABS: 4.0000 mg | ORAL_TABLET | Freq: Four times a day (QID) | ORAL | Status: DC | PRN

## 2020-09-30 MED ORDER — HEPARIN SODIUM (PORCINE) 5000 UNIT/ML IJ SOLN
5000.0000 [IU] | Freq: Once | INTRAMUSCULAR | Status: AC
  Administered 2020-09-30: 5000 [IU] via SUBCUTANEOUS
  Filled 2020-09-30: qty 1

## 2020-09-30 MED ORDER — LACTATED RINGERS IV SOLN: INTRAVENOUS | Status: DC

## 2020-09-30 MED ORDER — DIPHENHYDRAMINE HCL 50 MG/ML IJ SOLN: 12.5000 mg | Freq: Four times a day (QID) | INTRAMUSCULAR | Status: DC | PRN

## 2020-09-30 MED ORDER — MIDAZOLAM HCL 2 MG/2ML IJ SOLN
INTRAMUSCULAR | Status: AC
  Filled 2020-09-30: qty 2

## 2020-09-30 MED ORDER — SODIUM CHLORIDE (PF) 0.9 % IJ SOLN
INTRAMUSCULAR | Status: AC
  Filled 2020-09-30: qty 20

## 2020-09-30 MED ORDER — NEOMYCIN SULFATE 500 MG PO TABS: 1000.0000 mg | ORAL_TABLET | ORAL | Status: DC

## 2020-09-30 MED ORDER — FENTANYL CITRATE (PF) 250 MCG/5ML IJ SOLN
INTRAMUSCULAR | Status: DC | PRN
  Administered 2020-09-30: 100 ug via INTRAVENOUS
  Administered 2020-09-30: 50 ug via INTRAVENOUS
  Administered 2020-09-30: 100 ug via INTRAVENOUS

## 2020-09-30 MED ORDER — POLYSACCHARIDE IRON COMPLEX 150 MG PO CAPS
150.0000 mg | ORAL_CAPSULE | Freq: Two times a day (BID) | ORAL | Status: DC
  Administered 2020-09-30 – 2020-10-01 (×3): 150 mg via ORAL
  Filled 2020-09-30 (×3): qty 1

## 2020-09-30 MED ORDER — ATORVASTATIN CALCIUM 20 MG PO TABS
20.0000 mg | ORAL_TABLET | Freq: Every evening | ORAL | Status: DC
  Administered 2020-09-30 – 2020-10-01 (×2): 20 mg via ORAL
  Filled 2020-09-30 (×2): qty 1

## 2020-09-30 MED ORDER — ALUM & MAG HYDROXIDE-SIMETH 200-200-20 MG/5ML PO SUSP: 30.0000 mL | Freq: Four times a day (QID) | ORAL | Status: DC | PRN

## 2020-09-30 MED ORDER — ONDANSETRON HCL 4 MG/2ML IJ SOLN: 4.0000 mg | Freq: Four times a day (QID) | INTRAMUSCULAR | Status: DC | PRN

## 2020-09-30 MED ORDER — KETAMINE HCL 10 MG/ML IJ SOLN
INTRAMUSCULAR | Status: DC | PRN
  Administered 2020-09-30: 50 mg via INTRAVENOUS
  Administered 2020-09-30: 10 mg via INTRAVENOUS

## 2020-09-30 MED ORDER — SIMETHICONE 80 MG PO CHEW: 40.0000 mg | CHEWABLE_TABLET | Freq: Four times a day (QID) | ORAL | Status: DC | PRN

## 2020-09-30 MED ORDER — SUGAMMADEX SODIUM 200 MG/2ML IV SOLN
INTRAVENOUS | Status: DC | PRN
  Administered 2020-09-30: 300 mg via INTRAVENOUS

## 2020-09-30 MED ORDER — PANTOPRAZOLE SODIUM 40 MG PO TBEC
40.0000 mg | DELAYED_RELEASE_TABLET | Freq: Two times a day (BID) | ORAL | Status: DC
  Administered 2020-10-01 (×2): 40 mg via ORAL
  Filled 2020-09-30 (×2): qty 1

## 2020-09-30 MED ORDER — HYDROMORPHONE HCL 1 MG/ML IJ SOLN: 0.5000 mg | INTRAMUSCULAR | Status: DC | PRN

## 2020-09-30 MED ORDER — LIDOCAINE 2% (20 MG/ML) 5 ML SYRINGE
INTRAMUSCULAR | Status: DC | PRN
  Administered 2020-09-30: 60 mg via INTRAVENOUS

## 2020-09-30 MED ORDER — MIDAZOLAM HCL 5 MG/5ML IJ SOLN
INTRAMUSCULAR | Status: DC | PRN
  Administered 2020-09-30: 2 mg via INTRAVENOUS

## 2020-09-30 SURGICAL SUPPLY — 64 items
ADH SKN CLS APL DERMABOND .7 (GAUZE/BANDAGES/DRESSINGS) ×1
APL PRP STRL LF DISP 70% ISPRP (MISCELLANEOUS) ×1
APPLIER CLIP ROT 10 11.4 M/L (STAPLE)
APR CLP MED LRG 11.4X10 (STAPLE)
BAG COUNTER SPONGE SURGICOUNT (BAG) IMPLANT
BAG SPNG CNTER NS LX DISP (BAG)
BAG SURGICOUNT SPONGE COUNTING (BAG)
BLADE CLIPPER SURG (BLADE) ×2 IMPLANT
CABLE HIGH FREQUENCY MONO STRZ (ELECTRODE) ×2 IMPLANT
CELLS DAT CNTRL 66122 CELL SVR (MISCELLANEOUS) IMPLANT
CHLORAPREP W/TINT 26 (MISCELLANEOUS) ×3 IMPLANT
CLIP APPLIE ROT 10 11.4 M/L (STAPLE) IMPLANT
DECANTER SPIKE VIAL GLASS SM (MISCELLANEOUS) ×3 IMPLANT
DERMABOND ADVANCED (GAUZE/BANDAGES/DRESSINGS) ×2
DERMABOND ADVANCED .7 DNX12 (GAUZE/BANDAGES/DRESSINGS) IMPLANT
DISSECTOR BLUNT TIP ENDO 5MM (MISCELLANEOUS) IMPLANT
ELECT REM PT RETURN 15FT ADLT (MISCELLANEOUS) ×3 IMPLANT
GAUZE SPONGE 4X4 12PLY STRL (GAUZE/BANDAGES/DRESSINGS) ×3 IMPLANT
GLOVE SURG ENC MOIS LTX SZ7.5 (GLOVE) ×6 IMPLANT
GLOVE SURG NEOPR MICRO LF SZ8 (GLOVE) ×6 IMPLANT
GOWN STRL REUS W/TWL XL LVL3 (GOWN DISPOSABLE) ×12 IMPLANT
KIT TURNOVER KIT A (KITS) ×3 IMPLANT
LIGASURE IMPACT 36 18CM CVD LR (INSTRUMENTS) IMPLANT
NS IRRIG 1000ML POUR BTL (IV SOLUTION) ×3 IMPLANT
PACK COLON (CUSTOM PROCEDURE TRAY) ×3 IMPLANT
PAD POSITIONING PINK XL (MISCELLANEOUS) ×2 IMPLANT
PENCIL SMOKE EVACUATOR (MISCELLANEOUS) IMPLANT
PROTECTOR NERVE ULNAR (MISCELLANEOUS) ×4 IMPLANT
RELOAD PROXIMATE 75MM BLUE (ENDOMECHANICALS) ×6 IMPLANT
RELOAD STAPLE 75 3.8 BLU REG (ENDOMECHANICALS) IMPLANT
RETRACTOR WND ALEXIS 18 MED (MISCELLANEOUS) IMPLANT
RTRCTR WOUND ALEXIS 18CM MED (MISCELLANEOUS)
SCISSORS LAP 5X35 DISP (ENDOMECHANICALS) ×3 IMPLANT
SEALER TISSUE G2 STRG ARTC 35C (ENDOMECHANICALS) ×2 IMPLANT
SET IRRIG TUBING LAPAROSCOPIC (IRRIGATION / IRRIGATOR) ×2 IMPLANT
SET TUBE SMOKE EVAC HIGH FLOW (TUBING) ×3 IMPLANT
SHEARS HARMONIC ACE PLUS 36CM (ENDOMECHANICALS) IMPLANT
SLEEVE ADV FIXATION 5X100MM (TROCAR) ×6 IMPLANT
SLEEVE ENDOPATH XCEL 5M (ENDOMECHANICALS) ×3 IMPLANT
STAPLER 90 3.5 STAND SLIM (STAPLE) ×3
STAPLER 90 3.5 STD SLIM (STAPLE) IMPLANT
STAPLER PROXIMATE 75MM BLUE (STAPLE) ×2 IMPLANT
STAPLER VISISTAT 35W (STAPLE) ×3 IMPLANT
SUT MNCRL AB 4-0 PS2 18 (SUTURE) ×3 IMPLANT
SUT PDS AB 1 CT1 27 (SUTURE) ×6 IMPLANT
SUT PROLENE 2 0 CT2 30 (SUTURE) IMPLANT
SUT PROLENE 2 0 KS (SUTURE) IMPLANT
SUT SILK 2 0 (SUTURE) ×3
SUT SILK 2 0 SH CR/8 (SUTURE) ×3 IMPLANT
SUT SILK 2-0 18XBRD TIE 12 (SUTURE) ×1 IMPLANT
SUT SILK 3 0 (SUTURE) ×3
SUT SILK 3 0 SH CR/8 (SUTURE) ×3 IMPLANT
SUT SILK 3-0 18XBRD TIE 12 (SUTURE) ×1 IMPLANT
SYS LAPSCP GELPORT 120MM (MISCELLANEOUS)
SYSTEM LAPSCP GELPORT 120MM (MISCELLANEOUS) IMPLANT
TAPE CLOTH 4X10 WHT NS (GAUZE/BANDAGES/DRESSINGS) ×2 IMPLANT
TOWEL OR 17X26 10 PK STRL BLUE (TOWEL DISPOSABLE) ×3 IMPLANT
TRAY FOLEY MTR SLVR 16FR STAT (SET/KITS/TRAYS/PACK) ×2 IMPLANT
TROCAR ADV FIXATION 5X100MM (TROCAR) ×3 IMPLANT
TROCAR BALLN 12MMX100 BLUNT (TROCAR) ×3 IMPLANT
TROCAR XCEL NON-BLD 11X100MML (ENDOMECHANICALS) IMPLANT
TUBING CONNECTING 10 (TUBING) ×4 IMPLANT
TUBING CONNECTING 10' (TUBING) ×2
YANKAUER SUCT BULB TIP NO VENT (SUCTIONS) ×3 IMPLANT

## 2020-09-30 NOTE — Transfer of Care (Signed)
Immediate Anesthesia Transfer of Care Note  Patient: Bradley Rodriguez  Procedure(s) Performed: LAPAROSCOPIC RIGHT HEMI COLECTOMY (Abdomen)  Patient Location: PACU  Anesthesia Type:General  Level of Consciousness: awake  Airway & Oxygen Therapy: Patient Spontanous Breathing and Patient connected to face mask oxygen  Post-op Assessment: Report given to RN and Post -op Vital signs reviewed and stable  Post vital signs: Reviewed and stable  Last Vitals:  Vitals Value Taken Time  BP 138/77 09/30/20 0956  Temp    Pulse 67 09/30/20 0957  Resp 11 09/30/20 0957  SpO2 99 % 09/30/20 0957  Vitals shown include unvalidated device data.  Last Pain:  Vitals:   09/30/20 0559  TempSrc:   PainSc: 0-No pain         Complications: No notable events documented.

## 2020-09-30 NOTE — Op Note (Signed)
PATIENT: Bradley Rodriguez  60 y.o. male  Patient Care Team: Caryl Bis, MD as PCP - General (Family Medicine) Harl Bowie, Alphonse Guild, MD as PCP - Cardiology (Cardiology) Brien Mates, RN as Oncology Nurse Navigator (Oncology) Derek Jack, MD as Medical Oncologist (Oncology)  PREOP DIAGNOSIS: Colon cancer, hepatic flexure  POSTOP DIAGNOSIS: Colon cancer, hepatic flexure  PROCEDURE: Laparoscopic right hemicolectomy  SURGEON: Nadeen Landau, MD  ASSISTANT: Leighton Ruff, MD  ANESTHESIA: General endotracheal  EBL: 50 mL Total I/O In: 850 [I.V.:850] Out: 200 [Urine:150; Blood:50]  DRAINS: None  SPECIMEN: Right colon (terminal ileum, appendix, right colon all en bloc)  COUNTS: Sponge, needle and instrument counts were reported correct x2  FINDINGS:  No obvious metastatic disease on visceral parietal peritoneum or liver. Tattoo in proximal most transverse colon. Foreshortened ascending colon. Right branch of middle colic included as portion of specimen and an ileocolic anastomosis fashioned to mid transverse colon.  NARRATIVE:  The patient was identified & brought into the operating room, placed supine on the operating table and SCDs were applied to the lower extremities. General endotracheal anesthesia was induced. He was positioned supine with arms tucked. Antibiotics were administered. A foley catheter was placed under sterile conditions. Hair in the region of planned surgery was clipped. The abdomen was prepped and draped in a sterile fashion. A timeout was performed confirming our patient and plan.   Beginning with the extraction port, a supraumbilical incision was made and carried down to the midline fascia. This was then incised with electrocautery. The peritoneum was identified and elevated between clamps and carefully opened sharply. A small Alexis wound protector with a cap and associated port was then placed. The abdomen was insufflated to 15 mmHg with CO2. A  laparoscope was placed and camera inspection revealed no evidence of injury. Bilateral TAP blocks were then performed under laparoscopic visualization using a mixture of 0.25% marcaine with epinepherine + Exparel. 3 additional ports were then placed under direct laparoscopic visualization - two in the left hemiabdomen and one in the right abdomen. The abdomen was surveyed. The liver and peritoneum appeared normal.  There were no signs of metastatic disease. Tattoo is identified in the proximal transverse colon just distal to the known colon mass.  He was positioned in trendelenburg with left side down. The ileocolic pedicle was identified. Gentle blunt dissection commenced around the pedicle and the duodenum was identified and freed from the surrounding structures. The ileocolic pedicle was then circumferentially dissected and the retroperitoneal plane developed up to the level of the hepatic flexure. The duodenum has been fully swept down and away from this. The ileocolic pedicle was then ligated and divided with the Enseal device. The pedicle was inspected and noted to be hemostatic. I mobilized the terminal ileum, taking care to avoid injuring any retroperitoneal structures.  After this I began to mobilize the ascending colon which is rather foreshortened by taking down the Naseem Adler line of Toldt. The hepatic flexure was then mobilized from this approach.  He was then repositioned in reverse Trendelenburg.  The omentum was retracted anteriorly and the transverse colon caudad.  The lesser sac was entered by mobilizing the omentum off of the mid and proximal transverse colon.  The associated mesocolon was mobilized.  The hepatic flexure was then fully mobilized from this approach as well.  The entire right colon was then flipped medially and mobilized off of the retroperitoneal structures until I could visualize the lateral edge of the duodenum underneath from both the  superior and inferior approaches.   At this  point, the abdomen was desufflated and the terminal ileum and right colon delivered through the wound protector. The terminal ileum was then transected using a GIA blue load stapler. The remaining mesentery was divided using the Enseal device. The divided mesentery was inspected and noted to be hemostatic. The distal point of transection was then identified on the transverse colon at a location that included the right branch of the middle colics, leaving the main middle colic feeding the remaining transverse colon. This was transected using another blue load GIA stapler.  The specimen was then passed off. Attention was turned to creating the anastomosis. The terminal ileum and transverse colon were inspected for orientation to ensure no twisting nor bowel included in the mesenteric defect. An anastomosis was created between the terminal ileum and the transverse colon using a 75 mm GIA blue load stapler. The staple line was inspected and noted to be hemostatic.  The common enterotomy channel was closed using a TA 90 blue load stapler including the corner of distal ileum/staple line with this. Staple line is hemostatic. 3-0 silk sutures were used to imbricate the corners of the staple line as well.  A 2-0 silk suture was placed securing the "apex" of the anastomosis which is approximately 3 fingerbreadths wide. The anastomosis was palpated and noted to be widely patent. This was then placed back into the abdomen. The abdomen was then irrigated with sterile saline and hemostasis verified. The omentum was then brought down over the anastomosis. The wound protector cap was replaced and CO2 reinsufflated. The laparoscopic ports were removed under direct visualization and the sites noted to be hemostatic. The Alexis wound protector was removed, counts were reported correct, and we switched to clean instruments, gowns and drapes.   The fascia was then closed using two running #1 PDS sutures.  The skin of all incision  sites was closed with 4-0 monocryl subcuticular suture. Dermabond was placed on the port sites and a sterile dressing was placed over the abdominal incision. All sponge, needle and instrument counts were reported correct. He was then awakened from anesthesia, extubated and transferred to a stretcher for transport to PACU in satisfactory condition.  DISPOSITION: PACU in satisfactory condition

## 2020-09-30 NOTE — Anesthesia Postprocedure Evaluation (Signed)
Anesthesia Post Note  Patient: Bradley Rodriguez  Procedure(s) Performed: LAPAROSCOPIC RIGHT HEMI COLECTOMY (Abdomen)     Patient location during evaluation: PACU Anesthesia Type: General Level of consciousness: awake and alert Pain management: pain level controlled Vital Signs Assessment: post-procedure vital signs reviewed and stable Respiratory status: spontaneous breathing, nonlabored ventilation, respiratory function stable and patient connected to nasal cannula oxygen Cardiovascular status: blood pressure returned to baseline and stable Postop Assessment: no apparent nausea or vomiting Anesthetic complications: no   No notable events documented.  Last Vitals:  Vitals:   09/30/20 1314 09/30/20 1406  BP: 128/73 138/82  Pulse: 67 72  Resp: 18 20  Temp: 36.6 C (!) 36.4 C  SpO2: 96% 99%    Last Pain:  Vitals:   09/30/20 1406  TempSrc: Oral  PainSc:                  Catalina Gravel

## 2020-09-30 NOTE — H&P (Signed)
Chief Complaint: Ascending colon cancer  History of Present Illness: Bradley Rodriguez is a 60 y.o. male with history of HTN, HLD, DM, GERD, paroxysmal afib (not on anticoagulation), cardioversion, L kidney cancer (s/p robotic L nephrectomy 09/2019 Dr. Lovena Neighbours) whom is seen in the office today as a referral by Dr. Montez Morita for evaluation of newly diagnosed ascending colon cancer. He had initially presented with iron deficiency anemia.  He underwent EGD+colonoscopy with Dr. Jenetta Downer 08/19/20 -   Cscope showed mass in ascending colon which was biopsied and tattoo'd 10 other smaller polyps were removed.  Pathology on the ascending colon mass showed invasive adenocarcinoma Additionally, polyp from transverse colon showed TA + single fragment of at least intramucosal adenocarcinoma. Comment made: "It is possible that several specimens have been switched.  Clinically,  an ascending colon mass was noted and biopsied.  The descending colon  was noted to have polyps, and was biopsied.  The microscopic diagnosis  suggests the G specimen may have been exchanged for the D or E  specimens.  Clinical/endoscopic correlation is recommended."  All polyps were removed in single piece per notes. I have reached out to Dr. Jenetta Downer. He has reviewed the pathology and noted in message to me via Epic this was related to specimen mishandling/contamination as he only took one polyp from the transverse colon and it's showing different types of tissue  EGD showed small gastric polyp  CT CAP 08/26/20 - read pending; reviewed images in office today CEA 0.7  He does note an approximate 1 year history of iron deficiency anemia and he has been taking iron for this. He denies any complaints today in terms of abdominal pain, nausea, vomiting, distention. He has a good appetite.  He denies any changes in his health or health history   CT CAP from 8/5 showed lesion in right colon, potentially hepatic flexure, without  evident metastatic disease   PMH: HTN, HLD, DM, GERD, paroxysmal afib (not on anticoagulation) -follows with Dr. 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 4.3 cm adrenal cortical adenoma.  PSH: Robotic left nephrectomy, Dr. Lovena Neighbours, 09/2019  FHx: Reports both his mother and father had colon cancer. He denies any known family history of breast, endometrial, or ovarian cancer.  Social Hx: Denies use of tobacco/EtOH/illicit drug. He is happily retired.  Past Medical History:  Diagnosis Date   A-fib [facility])    DCCV 2009, 2012, 2014, Nov 2016   Anxiety    Arthritis    Knees, wrist, ankles   Cancer ([place]) 07/2019   Kidney   Diabetes mellitus without complication ([place])    Dysrhythmia 1996   A- Fib   GERD (gastroesophageal reflux disease)    History of kidney stones    HX: anticoagulation    very remotely and briefly on COumadin aound one of his CV, 2014 on Eliquis had hematuria with renal calculi and stopped   Hyperlipidemia    patient denies this   Hypertension    IBS (irritable bowel syndrome)    Normal cardiac stress test    Normal nuclear stress test , 2001    Past Surgical History:  Procedure Laterality Date   BIOPSY  08/19/2020   Procedure: BIOPSY;  Surgeon: Harvel Quale, MD;  [facility];  Service: Gastroenterology;;   CARDIOVERSION N/A 10/15/2012   Procedure: CARDIOVERSION;  Surgeon: Larey Dresser, MD;  Location: [place];  Service: Cardiovascular;  Laterality: N/A;   CARDIOVERSION N/A 11/26/2014  Procedure: CARDIOVERSION;  Surgeon: Arnoldo Lenis, MD;  [facility];  Service: Endoscopy;  Laterality: N/A;   CARDIOVERSION N/A 11/15/2015   Procedure: CARDIOVERSION;  Surgeon: Jerline Pain, MD;  Location: [place];  Service: Cardiovascular;  Laterality: N/A;   CARDIOVERSION N/A 08/07/2016   Procedure: CARDIOVERSION;  Surgeon: Herminio Commons, MD;  [facility];  Service:  Cardiovascular;  Laterality: N/A;   CARDIOVERSION N/A 09/11/2018   Procedure: CARDIOVERSION;  Surgeon: Arnoldo Lenis, MD;  [facility];  Service: Endoscopy;  Laterality: N/A;   CATARACT EXTRACTION W/PHACO Right 01/18/2014   Procedure: CATARACT EXTRACTION PHACO AND INTRAOCULAR LENS PLACEMENT (IOC);  Surgeon: Tonny Branch, MD;  [facility];  Service: Ophthalmology;  Laterality: Right;  CDE 9.95   COLONOSCOPY WITH PROPOFOL N/A 08/19/2020   Procedure: COLONOSCOPY WITH PROPOFOL;  Surgeon: Harvel Quale, MD;  [facility];  Service: Gastroenterology;  Laterality: N/A;  8:20   CYSTOSCOPY WITH LITHOLAPAXY N/A 10/06/2019   Procedure: CYSTOSCOPY WITH LASER LITHOLAPAXY;  Surgeon: Ceasar Mons, MD;  [facility];  Service: Urology;  Laterality: N/A;   ESOPHAGOGASTRODUODENOSCOPY (EGD) WITH PROPOFOL N/A 08/19/2020   Procedure: ESOPHAGOGASTRODUODENOSCOPY (EGD) WITH PROPOFOL;  Surgeon: Harvel Quale, MD;  [facility];  Service: Gastroenterology;  Laterality: N/A;   EYE SURGERY Right    detached retina   FOOT SURGERY Left    KNEE ARTHROSCOPY Right    LITHOTRIPSY     for kidney stones 2002   POLYPECTOMY  08/19/2020   Procedure: POLYPECTOMY INTESTINAL;  Surgeon: Harvel Quale, MD;  [facility];  Service: Gastroenterology;;   POLYPECTOMY  08/19/2020   Procedure: POLYPECTOMY;  Surgeon: Harvel Quale, MD;  [facility];  Service: Gastroenterology;;   ROBOT ASSISTED LAPAROSCOPIC NEPHRECTOMY Left 10/06/2019   Procedure: XI ROBOTIC ASSISTED LAPAROSCOPIC NEPHRECTOMY WITH ADRENALECTOMY;  Surgeon: Ceasar Mons, MD;  [facility];  Service: Urology;  Laterality: Left;   SUBMUCOSAL TATTOO INJECTION  08/19/2020   Procedure: SUBMUCOSAL TATTOO INJECTION;  Surgeon: Harvel Quale, MD;  [facility];  Service: Gastroenterology;;   TEE WITHOUT CARDIOVERSION N/A 11/26/2014    Procedure: TRANSESOPHAGEAL ECHOCARDIOGRAM (TEE) WITH PROPOFOL;  Surgeon: Arnoldo Lenis, MD;  [facility];  Service: Endoscopy;  Laterality: N/A;   TEE WITHOUT CARDIOVERSION N/A 11/15/2015   Procedure: TRANSESOPHAGEAL ECHOCARDIOGRAM (TEE);  Surgeon: Jerline Pain, MD;  Location: [place];  Service: Cardiovascular;  Laterality: N/A;   TEE WITHOUT CARDIOVERSION N/A 09/11/2018   Procedure: TRANSESOPHAGEAL ECHOCARDIOGRAM (TEE) WITH PROPOFOL;  Surgeon: Arnoldo Lenis, MD;  [facility];  Service: Endoscopy;  Laterality: N/A;   VASECTOMY      Family History  Problem Relation Age of Onset   Diabetes Mother    Hypertension Mother    Colon cancer Mother        dx. 17s   Diabetes Father    Colon cancer Father        dx. 58s   Lung cancer Maternal Grandmother        smoked   Bone cancer Maternal Grandfather        dx. 80s   Hypertension Other     Social:  reports that he has never smoked. He has never used smokeless tobacco. He reports that he does not drink alcohol and does not use drugs.  Allergies:  Allergies  Allergen Reactions   Ace Inhibitors Cough   Lisinopril Cough   Xarelto [Rivaroxaban]  Other (See Comments)    Heart out of rhythm    Quinine Derivatives Rash    Medications: I have reviewed the patient's current medications.  Results for orders placed or performed during the hospital encounter of 09/30/20 (from the past 48 hour(s))  Glucose, capillary     Status: Abnormal   Collection Time: 09/30/20  5:33 AM  Result Value Ref Range   Glucose-Capillary 123 (H) 70 - 99 mg/dL    Comment: Glucose reference range applies only to samples taken after fasting for at least 8 hours.    No results found.  ROS - all of the below systems have been reviewed with the patient and positives are indicated with bold text General: chills, fever or night sweats Eyes: blurry vision or double vision ENT: epistaxis or sore throat Allergy/Immunology: itchy/watery eyes or  nasal congestion Hematologic/Lymphatic: bleeding problems, blood clots or swollen lymph nodes Endocrine: temperature intolerance or unexpected weight changes Breast: new or changing breast lumps or nipple discharge Resp: cough, shortness of breath, or wheezing CV: chest pain or dyspnea on exertion GI: as per HPI GU: dysuria, trouble voiding, or hematuria MSK: joint pain or joint stiffness Neuro: TIA or stroke symptoms Derm: pruritus and skin lesion changes Psych: anxiety and depression  PE Blood pressure 120/81, pulse (!) 58, temperature 98.3 F (36.8 C), temperature source Oral, resp. rate 18, weight (!) 136.3 kg, SpO2 99 %. Constitutional: NAD; conversant Eyes: Moist conjunctiva; no lid lag Lungs: Normal respiratory effort CV: RRR; no palpable thrills; no pitting edema GI: Abd soft, NT/ND; no palpable hepatosplenomegaly MSK: Normal range of motion of extremities Psychiatric: Appropriate affect; alert and oriented x3  Results for orders placed or performed during the hospital encounter of 09/30/20 (from the past 48 hour(s))  Glucose, capillary     Status: Abnormal   Collection Time: 09/30/20  5:33 AM  Result Value Ref Range   Glucose-Capillary 123 (H) 70 - 99 mg/dL    Comment: Glucose reference range applies only to samples taken after fasting for at least 8 hours.    No results found.   A/P: Mussie Adger is a very pleasant 60 y.o. male with hx of HTN, HLD, DM, GERD, paroxysmal afib (not on anticoagulation) here for evaluation of ascending colon cancer  -Genetics referral - personal hx of kidney and colon cancer; family hx of colon cancer - mother and father; personal hx of 10+ colon polyps -Cardiac clearance obtained -The anatomy and physiology of the GI tract was reviewed with him. The pathophysiology of colon cancer was discussed as well with associated pictures. -We have discussed various different treatment options going forward including surgery (the most definitive) to  address this - laparoscopic right hemicolectomy. -The planned procedure, material risks (including, but not limited to, pain, bleeding, infection, scarring, need for blood transfusion, damage to surrounding structures- blood vessels/nerves/viscus/organs, damage to ureter, urine leak, leak from anastomosis, need for additional procedures, scenarios where a stoma may be necessary and where it may be permanent, worsening of pre-existing medical conditions, hernia, recurrence, pneumonia, heart attack, stroke, death) benefits and alternatives to surgery were discussed at length. The patient's questions were answered to his satisfaction, he voiced understanding and elected to proceed with surgery. Additionally, we discussed typical postoperative expectations and the recovery process.  Nadeen Landau, MD [facility] Surgery Use [email or web address] to contact on call provider

## 2020-09-30 NOTE — Anesthesia Procedure Notes (Signed)
Procedure Name: Intubation Date/Time: 09/30/2020 7:49 AM Performed by: Lieutenant Diego, CRNA Pre-anesthesia Checklist: Patient identified, Emergency Drugs available, Suction available and Patient being monitored Patient Re-evaluated:Patient Re-evaluated prior to induction Oxygen Delivery Method: Circle system utilized Preoxygenation: Pre-oxygenation with 100% oxygen Induction Type: IV induction Ventilation: Mask ventilation without difficulty Laryngoscope Size: Miller and 2 Grade View: Grade I Tube type: Oral Tube size: 7.5 mm Number of attempts: 1 Airway Equipment and Method: Stylet and Oral airway Placement Confirmation: ETT inserted through vocal cords under direct vision, positive ETCO2 and breath sounds checked- equal and bilateral Secured at: 24 cm Tube secured with: Tape Dental Injury: Teeth and Oropharynx as per pre-operative assessment

## 2020-09-30 NOTE — Discharge Instructions (Signed)
POST OP INSTRUCTIONS AFTER COLON SURGERY  DIET: Be sure to include lots of fluids daily to stay hydrated - 64oz of water per day (8, 8 oz glasses).  Avoid fast food or heavy meals for the first couple of weeks as your are more likely to get nauseated. Avoid raw/uncooked fruits or vegetables for the first 4 weeks (its ok to have these if they are blended into smoothie form). If you have fruits/vegetables, make sure they are cooked until soft enough to mash on the roof of your mouth and chew your food well. Otherwise, diet as tolerated.  Take your usually prescribed home medications unless otherwise directed.  PAIN CONTROL: Pain is best controlled by a usual combination of three different methods TOGETHER: Ice/Heat Over the counter pain medication Prescription pain medication Most patients will experience some swelling and bruising around the surgical site.  Ice packs or heating pads (30-60 minutes up to 6 times a day) will help. Some people prefer to use ice alone, heat alone, alternating between ice & heat.  Experiment to what works for you.  Swelling and bruising can take several weeks to resolve.   It is helpful to take an over-the-counter pain medication regularly for the first few weeks: Ibuprofen (Motrin/Advil) - 200mg tabs - take 3 tabs (600mg) every 6 hours as needed for pain (unless you have been directed previously to avoid NSAIDs/ibuprofen) Acetaminophen (Tylenol) - you may take 650mg every 6 hours as needed. You can take this with motrin as they act differently on the body. If you are taking a narcotic pain medication that has acetaminophen in it, do not take over the counter tylenol at the same time. NOTE: You may take both of these medications together - most patients  find it most helpful when alternating between the two (i.e. Ibuprofen at 6am, tylenol at 9am, ibuprofen at 12pm ...) A  prescription for pain medication should be given to you upon discharge.  Take your pain medication as  prescribed if your pain is not adequatly controlled with the over-the-counter pain reliefs mentioned above.  Avoid getting constipated.  Between the surgery and the pain medications, it is common to experience some constipation.  Increasing fluid intake and taking a fiber supplement (such as Metamucil, Citrucel, FiberCon, MiraLax, etc) 1-2 times a day regularly will usually help prevent this problem from occurring.  A mild laxative (prune juice, Milk of Magnesia, MiraLax, etc) should be taken according to package directions if there are no bowel movements after 48 hours.    Dressing: Your incisions are covered in Dermabond which is like sterile superglue for the skin. This will come off on it's own in a couple weeks. It is waterproof and you may bathe normally starting the day after your surgery in a shower. Avoid baths/pools/lakes/oceans until your wounds have fully healed.  ACTIVITIES as tolerated:   Avoid heavy lifting (>10lbs or 1 gallon of milk) for the next 6 weeks. You may resume regular daily activities as tolerated--such as daily self-care, walking, climbing stairs--gradually increasing activities as tolerated.  If you can walk 30 minutes without difficulty, it is safe to try more intense activity such as jogging, treadmill, bicycling, low-impact aerobics.  DO NOT PUSH THROUGH PAIN.  Let pain be your guide: If it hurts to do something, don't do it. You may drive when you are no longer taking prescription pain medication, you can comfortably wear a seatbelt, and you can safely maneuver your car and apply brakes.  FOLLOW UP in our   office Please call [facility] at [phone] to set up an appointment to see your surgeon in the office for a follow-up appointment approximately 2 weeks after your surgery. Make sure that you call for this appointment the day you arrive home to insure a convenient appointment time.  9. If you have disability or family leave forms that need to be completed, you may have  them completed by your primary care physician's office; for return to work instructions, please ask our office staff and they will be happy to assist you in obtaining this documentation   When to call us [phone]: Poor pain control Reactions / problems with new medications (rash/itching, etc)  Fever over 101.5 F (38.5 C) Inability to urinate Nausea/vomiting Worsening swelling or bruising Continued bleeding from incision. Increased pain, redness, or drainage from the incision  The clinic staff is available to answer your questions during regular business hours (8:30am-5pm).  Please don't hesitate to call and ask to speak to one of our nurses for clinical concerns.   A surgeon from [facility] is always on call at the hospitals   If you have a medical emergency, go to the nearest emergency room or call 911.  [facility] [address], [address], [place], [place]  [address] MAIN: [phone] FAX: [phone] [email or web address]  

## 2020-09-30 NOTE — Anesthesia Preprocedure Evaluation (Addendum)
Anesthesia Evaluation  Patient identified by MRN, date of birth, ID band Patient awake    Reviewed: Allergy & Precautions, NPO status , Patient's Chart, lab work & pertinent test results, reviewed documented beta blocker date and time   Airway Mallampati: III  TM Distance: >3 FB Neck ROM: Full    Dental  (+) Dental Advisory Given, Missing, Partial Upper,    Pulmonary neg pulmonary ROS,    Pulmonary exam normal breath sounds clear to auscultation       Cardiovascular hypertension, Pt. on medications and Pt. on home beta blockers (-) angina(-) Past MI Normal cardiovascular exam+ dysrhythmias Atrial Fibrillation  Rhythm:Regular Rate:Normal     Neuro/Psych PSYCHIATRIC DISORDERS Anxiety negative neurological ROS     GI/Hepatic Neg liver ROS, GERD  Medicated,  Endo/Other  diabetes, Type 2, Oral Hypoglycemic AgentsObesity   Renal/GU Renal InsufficiencyRenal disease     Musculoskeletal  (+) Arthritis ,   Abdominal   Peds  Hematology  (+) Blood dyscrasia, anemia ,   Anesthesia Other Findings Day of surgery medications reviewed with the patient.  Reproductive/Obstetrics                            Anesthesia Physical Anesthesia Plan  ASA: 3  Anesthesia Plan: General   Post-op Pain Management:    Induction: Intravenous  PONV Risk Score and Plan: 3 and Midazolam, Dexamethasone and Ondansetron  Airway Management Planned: Oral ETT  Additional Equipment:   Intra-op Plan:   Post-operative Plan: Extubation in OR  Informed Consent: I have reviewed the patients History and Physical, chart, labs and discussed the procedure including the risks, benefits and alternatives for the proposed anesthesia with the patient or authorized representative who has indicated his/her understanding and acceptance.     Dental advisory given  Plan Discussed with: CRNA  Anesthesia Plan Comments: (2nd PIV after  induction)       Anesthesia Quick Evaluation

## 2020-10-01 ENCOUNTER — Encounter (HOSPITAL_COMMUNITY): Payer: Self-pay | Admitting: Surgery

## 2020-10-01 LAB — CBC
HCT: 29.5 % — ABNORMAL LOW (ref 39.0–52.0)
Hemoglobin: 9.3 g/dL — ABNORMAL LOW (ref 13.0–17.0)
MCH: 25.6 pg — ABNORMAL LOW (ref 26.0–34.0)
MCHC: 31.5 g/dL (ref 30.0–36.0)
MCV: 81.3 fL (ref 80.0–100.0)
Platelets: 342 10*3/uL (ref 150–400)
RBC: 3.63 MIL/uL — ABNORMAL LOW (ref 4.22–5.81)
RDW: 15.2 % (ref 11.5–15.5)
WBC: 9.9 10*3/uL (ref 4.0–10.5)
nRBC: 0 % (ref 0.0–0.2)

## 2020-10-01 LAB — BASIC METABOLIC PANEL
Anion gap: 11 (ref 5–15)
BUN: 23 mg/dL (ref 8–23)
CO2: 23 mmol/L (ref 22–32)
Calcium: 9.3 mg/dL (ref 8.9–10.3)
Chloride: 105 mmol/L (ref 98–111)
Creatinine, Ser: 1.33 mg/dL — ABNORMAL HIGH (ref 0.61–1.24)
GFR, Estimated: >60 mL/min (ref >60)
Glucose, Bld: 168 mg/dL — ABNORMAL HIGH (ref 70–99)
Potassium: 4.5 mmol/L (ref 3.5–5.1)
Sodium: 139 mmol/L (ref 135–145)

## 2020-10-01 LAB — GLUCOSE, CAPILLARY
Glucose-Capillary: 101 mg/dL — ABNORMAL HIGH (ref 70–99)
Glucose-Capillary: 128 mg/dL — ABNORMAL HIGH (ref 70–99)
Glucose-Capillary: 131 mg/dL — ABNORMAL HIGH (ref 70–99)
Glucose-Capillary: 143 mg/dL — ABNORMAL HIGH (ref 70–99)
Glucose-Capillary: 167 mg/dL — ABNORMAL HIGH (ref 70–99)
Glucose-Capillary: 171 mg/dL — ABNORMAL HIGH (ref 70–99)
Glucose-Capillary: 172 mg/dL — ABNORMAL HIGH (ref 70–99)
Glucose-Capillary: 203 mg/dL — ABNORMAL HIGH (ref 70–99)

## 2020-10-01 NOTE — Progress Notes (Signed)
Pharmacy Brief Note - Alvimopan (Entereg)  The standing order set for alvimopan (Entereg) now includes an automatic order to discontinue the drug after the patient has had a bowel movement.  The change was approved by the [place] and the Medical Executive Committee.    This patient has had a bowel movement documented by Dr Ninfa Linden.  Therefore, alvimopan has been discontinued.  If there are questions, please contact the pharmacy at [phone].  Thank you  Gretta Arab PharmD, BCPS Clinical Pharmacist [facility] main pharmacy [phone] 10/01/2020 1:46 PM

## 2020-10-01 NOTE — Plan of Care (Signed)
  Problem: Clinical Measurements: Goal: Ability to maintain clinical measurements within normal limits will improve Outcome: Progressing   

## 2020-10-01 NOTE — Progress Notes (Signed)
1 Day Post-Op   Subjective/Chief Complaint: Reports no pain or nausea post op Had BM   Objective: Vital signs in last 24 hours: Temp:  [97.1 F (36.2 C)-98.7 F (37.1 C)] 97.8 F (36.6 C) (09/10 0555) Pulse Rate:  [58-86] 80 (09/10 0555) Resp:  [11-20] 18 (09/10 0555) BP: (117-155)/(71-94) 134/71 (09/10 0555) SpO2:  [96 %-100 %] 96 % (09/10 0555) Weight:  [136.3 kg] 136.3 kg (09/09 1100) Last BM Date: 09/30/20  Intake/Output from previous day: 09/09 0701 - 09/10 0700 In: 3272.4 [P.O.:1225; I.V.:2047.4] Out: 1570 [Urine:1520; Blood:50] Intake/Output this shift: No intake/output data recorded.  Exam: Awake and alert Looks comfortable Abdomen soft, non-distended, minimally tender, dressings dry  Lab Results:  Recent Labs    10/01/20 0432  WBC 9.9  HGB 9.3*  HCT 29.5*  PLT 342   BMET Recent Labs    10/01/20 0432  NA 139  K 4.5  CL 105  CO2 23  GLUCOSE 168*  BUN 23  CREATININE 1.33*  CALCIUM 9.3   PT/INR No results for input(s): LABPROT, INR in the last 72 hours. ABG No results for input(s): PHART, HCO3 in the last 72 hours.  Invalid input(s): PCO2, PO2  Studies/Results: No results found.  Anti-infectives: Anti-infectives (From admission, onward)    Start     Dose/Rate Route Frequency Ordered Stop   09/30/20 1400  neomycin (MYCIFRADIN) tablet 1,000 mg  Status:  Discontinued       See Hyperspace for full Linked Orders Report.   1,000 mg Oral 3 times per day 09/30/20 0512 09/30/20 0517   09/30/20 1400  metroNIDAZOLE (FLAGYL) tablet 1,000 mg  Status:  Discontinued       See Hyperspace for full Linked Orders Report.   1,000 mg Oral 3 times per day 09/30/20 0512 09/30/20 0517   09/30/20 0600  cefoTEtan (CEFOTAN) 2 g in sodium chloride 0.9 % 100 mL IVPB        2 g 200 mL/hr over 30 Minutes Intravenous On call to O.R. 09/30/20 0512 09/30/20 0820       Assessment/Plan: s/p Procedure(s): LAPAROSCOPIC RIGHT HEMI COLECTOMY (N/A)  Doing  well Continuing current care Probable discharge tomorrow   LOS: 1 day    Coralie Keens MD 10/01/2020

## 2020-10-02 LAB — GLUCOSE, CAPILLARY
Glucose-Capillary: 112 mg/dL — ABNORMAL HIGH (ref 70–99)
Glucose-Capillary: 110 mg/dL — ABNORMAL HIGH (ref 70–99)
Glucose-Capillary: 120 mg/dL — ABNORMAL HIGH (ref 70–99)

## 2020-10-02 LAB — BASIC METABOLIC PANEL
Anion gap: 9 (ref 5–15)
BUN: 22 mg/dL (ref 8–23)
CO2: 21 mmol/L — ABNORMAL LOW (ref 22–32)
Calcium: 8.7 mg/dL — ABNORMAL LOW (ref 8.9–10.3)
Chloride: 107 mmol/L (ref 98–111)
Creatinine, Ser: 1.25 mg/dL — ABNORMAL HIGH (ref 0.61–1.24)
GFR, Estimated: >60 mL/min (ref >60)
Glucose, Bld: 114 mg/dL — ABNORMAL HIGH (ref 70–99)
Potassium: 4.5 mmol/L (ref 3.5–5.1)
Sodium: 137 mmol/L (ref 135–145)

## 2020-10-02 LAB — CBC
HCT: 28.1 % — ABNORMAL LOW (ref 39.0–52.0)
Hemoglobin: 8.6 g/dL — ABNORMAL LOW (ref 13.0–17.0)
MCH: 25.5 pg — ABNORMAL LOW (ref 26.0–34.0)
MCHC: 30.6 g/dL (ref 30.0–36.0)
MCV: 83.4 fL (ref 80.0–100.0)
Platelets: 330 10*3/uL (ref 150–400)
RBC: 3.37 MIL/uL — ABNORMAL LOW (ref 4.22–5.81)
RDW: 15.7 % — ABNORMAL HIGH (ref 11.5–15.5)
WBC: 7.9 10*3/uL (ref 4.0–10.5)
nRBC: 0 % (ref 0.0–0.2)

## 2020-10-02 NOTE — Progress Notes (Signed)
Patient ID: Bradley Rodriguez, male   DOB: 1960/07/16, 60 y.o.   MRN: [id number]   Doing well Discharge home

## 2020-10-02 NOTE — Discharge Summary (Signed)
Physician Discharge Summary  Patient ID: Bradley Rodriguez MRN: [id number] DOB/AGE: December 11, 1960 60 y.o.  Admit date: 09/30/2020 Discharge date: 10/02/2020  Admission Diagnoses:  Discharge Diagnoses:  Active Problems:   Colon cancer [facility])   Discharged Condition: good  Hospital Course: uneventful post op recovery.  Discharged home POD#2 having BM's and tolerating a diet  Consults: None  Significant Diagnostic Studies:   Treatments: surgery: laparoscopic right hemicolectomy  Discharge Exam: Blood pressure 125/79, pulse (!) 50, temperature (!) 97.4 F (36.3 C), temperature source Oral, resp. rate 16, height '6\' 8"'$  (2.032 m), weight (!) 136.3 kg, SpO2 98 %. General appearance: alert, cooperative, and no distress Resp: clear to auscultation bilaterally Cardio: regular rate and rhythm, S1, S2 normal, no murmur, click, rub or gallop Incision/Wound: abdomen soft, incisions clean  Disposition: Discharge disposition: 01-Home or Self Care        Allergies as of 10/02/2020       Reactions   Ace Inhibitors Cough   Lisinopril Cough   Xarelto [rivaroxaban] Other (See Comments)   Heart out of rhythm    Quinine Derivatives Rash        Medication List     TAKE these medications    amLODipine 5 MG tablet Commonly known as: NORVASC TAKE 1 TABLET BY MOUTH EVERY DAY What changed: when to take this   atorvastatin 20 MG tablet Commonly known as: LIPITOR Take 20 mg by mouth every evening.   cholecalciferol 25 MCG (1000 UNIT) tablet Commonly known as: VITAMIN D3 Take 1,000 Units by mouth daily.   CITRACAL + D PO Take 1 tablet by mouth daily.   ESTER C PO Take 1,000 mg by mouth daily.   iron polysaccharides 150 MG capsule Commonly known as: NIFEREX Take 150 mg by mouth 2 (two) times daily.   losartan 100 MG tablet Commonly known as: COZAAR Take 100 mg by mouth every evening.   metoprolol tartrate 100 MG tablet Commonly known as: LOPRESSOR TAKE 1 TABLET BY MOUTH TWICE  A DAY   omeprazole 40 MG capsule Commonly known as: PRILOSEC TAKE 1 CAPSULE BY MOUTH TWICE A DAY   OVER THE COUNTER MEDICATION Take 1 tablet by mouth 2 (two) times daily. Glucocil otc supplement   OZEMPIC (0.25 OR 0.5 MG/DOSE) [place] Inject 0.5 mg into the skin every Monday.   traMADol 50 MG tablet Commonly known as: Ultram Take 1 tablet (50 mg total) by mouth every 6 (six) hours as needed for up to 5 days (postop pain not controlled with tylenol and ibuprofen).   Vitamin D (Ergocalciferol) 1.25 MG (50000 UNIT) Caps capsule Commonly known as: DRISDOL Take 50,000 Units by mouth every Wednesday.   zinc gluconate 50 MG tablet Take 50 mg by mouth daily.        Follow-up Information     Ileana Roup, MD Follow up in 2 week(s).   Specialties: General Surgery, Colon and Rectal Surgery Contact information: [place] [address] [phone]                 Signed: Coralie Keens 10/02/2020, 9:11 AM

## 2020-10-02 NOTE — Progress Notes (Signed)
Reviewed written d/c instructions w pt and all questions answered. He verbalized understanding. D/ C per w/c w all belongings in stable condition.

## 2020-10-03 LAB — SURGICAL PATHOLOGY

## 2020-10-03 LAB — GLUCOSE, CAPILLARY: Glucose-Capillary: 200 mg/dL — ABNORMAL HIGH (ref 70–99)

## 2020-10-04 ENCOUNTER — Other Ambulatory Visit: Payer: Self-pay

## 2020-10-04 DIAGNOSIS — R238 Other skin changes: Secondary | ICD-10-CM | POA: Diagnosis not present

## 2020-10-12 ENCOUNTER — Other Ambulatory Visit (HOSPITAL_COMMUNITY): Payer: Self-pay | Admitting: *Deleted

## 2020-10-12 DIAGNOSIS — C182 Malignant neoplasm of ascending colon: Secondary | ICD-10-CM

## 2020-10-12 NOTE — Progress Notes (Signed)
[place], [place] [address]   CLINIC:  Medical Oncology/Hematology  PCP:  Caryl Bis, MD [address] [place] [address] [phone]   REASON FOR VISIT:  Follow-up for colon cancer  PRIOR THERAPY:  Left nephrectomy by Dr. Gilford Rile on 10/06/2019   NGS Results: not done  CURRENT THERAPY: under work-up  BRIEF ONCOLOGIC HISTORY:  Oncology History  Cancer of ascending colon ([place])  09/01/2020 Initial Diagnosis   Cancer of ascending colon ([place])   10/19/2020 -  Chemotherapy    Patient is on Treatment Plan: COLORECTAL XELOX (CAPEOX) Q21D         CANCER STAGING: Cancer Staging Cancer of ascending colon ([place]) Staging form: Colon and Rectum, AJCC 8th Edition - Clinical stage from 09/01/2020: Stage IIIB (cT3, cN1b, cM0) - Unsigned   INTERVAL HISTORY:  [name], a 60 y.o. male, returns for routine follow-up of his colon cancer. Gery was last seen on 09/01/2020.   Today he reports feeling good. He denies any current pains and he reports regular BM. He currently has mild numbness in his fingertips and toes due to a history of DM, and he denies any associated pain.   REVIEW OF SYSTEMS:  Review of Systems  Constitutional:  Negative for appetite change and fatigue.  Gastrointestinal:  Negative for blood in stool, constipation and diarrhea.  Musculoskeletal:  Negative for arthralgias and myalgias.  Neurological:  Positive for numbness (fingertips and toes).  All other systems reviewed and are negative.  PAST MEDICAL/SURGICAL HISTORY:  Past Medical History:  Diagnosis Date   A-fib [facility])    DCCV 2009, 2012, 2014, Nov 2016   Anxiety    Arthritis    Knees, wrist, ankles   Cancer ([place]) 07/2019   Kidney   Diabetes mellitus without complication ([place])    Dysrhythmia 1996   A- Fib   GERD (gastroesophageal reflux disease)    History of kidney stones    HX: anticoagulation    very remotely and briefly on COumadin aound one of  his CV, 2014 on Eliquis had hematuria with renal calculi and stopped   Hyperlipidemia    patient denies this   Hypertension    IBS (irritable bowel syndrome)    Normal cardiac stress test    Normal nuclear stress test , 2001   Past Surgical History:  Procedure Laterality Date   BIOPSY  08/19/2020   Procedure: BIOPSY;  Surgeon: Harvel Quale, MD;  [facility];  Service: Gastroenterology;;   CARDIOVERSION N/A 10/15/2012   Procedure: CARDIOVERSION;  Surgeon: Larey Dresser, MD;  Location: [facility] ENDOSCOPY;  Service: Cardiovascular;  Laterality: N/A;   CARDIOVERSION N/A 11/26/2014   Procedure: CARDIOVERSION;  Surgeon: Arnoldo Lenis, MD;  Location: [facility] ORS;  Service: Endoscopy;  Laterality: N/A;   CARDIOVERSION N/A 11/15/2015   Procedure: CARDIOVERSION;  Surgeon: Jerline Pain, MD;  Location: [place];  Service: Cardiovascular;  Laterality: N/A;   CARDIOVERSION N/A 08/07/2016   Procedure: CARDIOVERSION;  Surgeon: Herminio Commons, MD;  Location: [facility] ORS;  Service: Cardiovascular;  Laterality: N/A;   CARDIOVERSION N/A 09/11/2018   Procedure: CARDIOVERSION;  Surgeon: Arnoldo Lenis, MD;  Location: [facility] OR[facility];  Service: Endoscopy;  Laterality: N/A;   CATARACT EXTRACTION W/PHACO Right 01/18/2014   Procedure: CATARACT EXTRACTION PHACO AND INTRAOCULAR LENS PLACEMENT (IOC);  Surgeon: Tonny Branch, MD;  Location: [facility] OR[facility];  Service: Ophthalmology;  Laterality: Right;  CDE 9.95   COLONOSCOPY  WITH PROPOFOL N/A 08/19/2020   Procedure: COLONOSCOPY WITH PROPOFOL;  Surgeon: Harvel Quale, MD;  [facility];  Service: Gastroenterology;  Laterality: N/A;  8:20   CYSTOSCOPY WITH LITHOLAPAXY N/A 10/06/2019   Procedure: CYSTOSCOPY WITH LASER LITHOLAPAXY;  Surgeon: Ceasar Mons, MD;  [facility];  Service: Urology;  Laterality: N/A;   ESOPHAGOGASTRODUODENOSCOPY (EGD) WITH PROPOFOL N/A 08/19/2020   Procedure: ESOPHAGOGASTRODUODENOSCOPY (EGD) WITH  PROPOFOL;  Surgeon: Harvel Quale, MD;  [facility];  Service: Gastroenterology;  Laterality: N/A;   EYE SURGERY Right    detached retina   FOOT SURGERY Left    KNEE ARTHROSCOPY Right    LAPAROSCOPIC RIGHT HEMI COLECTOMY N/A 09/30/2020   Procedure: LAPAROSCOPIC RIGHT HEMI COLECTOMY;  Surgeon: Ileana Roup, MD;  [facility];  Service: General;  Laterality: N/A;   LITHOTRIPSY     for kidney stones 2002   POLYPECTOMY  08/19/2020   Procedure: POLYPECTOMY INTESTINAL;  Surgeon: Harvel Quale, MD;  [facility];  Service: Gastroenterology;;   POLYPECTOMY  08/19/2020   Procedure: POLYPECTOMY;  Surgeon: Harvel Quale, MD;  [facility];  Service: Gastroenterology;;   ROBOT ASSISTED LAPAROSCOPIC NEPHRECTOMY Left 10/06/2019   Procedure: XI ROBOTIC ASSISTED LAPAROSCOPIC NEPHRECTOMY WITH ADRENALECTOMY;  Surgeon: Ceasar Mons, MD;  [facility];  Service: Urology;  Laterality: Left;   SUBMUCOSAL TATTOO INJECTION  08/19/2020   Procedure: SUBMUCOSAL TATTOO INJECTION;  Surgeon: Harvel Quale, MD;  [facility];  Service: Gastroenterology;;   TEE WITHOUT CARDIOVERSION N/A 11/26/2014   Procedure: TRANSESOPHAGEAL ECHOCARDIOGRAM (TEE) WITH PROPOFOL;  Surgeon: Arnoldo Lenis, MD;  Location[facility];  Service: Endoscopy;  Laterality: N/A;   TEE WITHOUT CARDIOVERSION N/A 11/15/2015   Procedure: TRANSESOPHAGEAL ECHOCARDIOGRAM (TEE);  Surgeon: Jerline Pain, MD;  Location: [place];  Service: Cardiovascular;  Laterality: N/A;   TEE WITHOUT CARDIOVERSION N/A 09/11/2018   Procedure: TRANSESOPHAGEAL ECHOCARDIOGRAM (TEE) WITH PROPOFOL;  Surgeon: Arnoldo Lenis, MD;  Location: [facility];  Service: Endoscopy;  Laterality: N/A;   VASECTOMY      SOCIAL HISTORY:  Social History   Socioeconomic History   Marital status: Married    Spouse name: Not on file   Number of children: Not on file   Years of  education: Not on file   Highest education level: Not on file  Occupational History   Not on file  Tobacco Use   Smoking status: Never   Smokeless tobacco: Never  Vaping Use   Vaping Use: Never used  Substance and Sexual Activity   Alcohol use: No    Alcohol/week: 0.0 standard drinks   Drug use: No   Sexual activity: Yes    Birth control/protection: Surgical  Other Topics Concern   Not on file  Social History Narrative   Not on file   Social Determinants of Health   Financial Resource Strain: Low Risk    Difficulty of Paying Living Expenses: Not hard at all  Food Insecurity: No Food Insecurity   Worried About Charity fundraiser in the Last Year: Never true   [place] in the Last Year: Never true  Transportation Needs: No Transportation Needs   Lack of Transportation (Medical): No   Lack of Transportation (Non-Medical): No  Physical Activity: Inactive   Days of Exercise per Week: 0 days   Minutes of Exercise per Session: 0 min  Stress: No Stress Concern Present   Feeling of  Stress : Not at all  Social Connections: Moderately Integrated   Frequency of Communication with Friends and Family: More than three times a week   Frequency of Social Gatherings with Friends and Family: More than three times a week   Attends Religious Services: More than 4 times per year   Active Member of [employer] or Organizations: No   Attends Archivist Meetings: Never   Marital Status: Married  Human resources officer Violence: Not At Risk   Fear of Current or Ex-Partner: No   Emotionally Abused: No   Physically Abused: No   Sexually Abused: No    FAMILY HISTORY:  Family History  Problem Relation Age of Onset   Diabetes Mother    Hypertension Mother    Colon cancer Mother        dx. 4s   Diabetes Father    Colon cancer Father        dx. 48s   Lung cancer Maternal Grandmother        smoked   Bone cancer Maternal Grandfather        dx. 80s   Hypertension Other      CURRENT MEDICATIONS:  Current Outpatient Medications  Medication Sig Dispense Refill   amLODipine (NORVASC) 5 MG tablet TAKE 1 TABLET BY MOUTH EVERY DAY (Patient taking differently: Take 5 mg by mouth every evening.) 90 tablet 3   atorvastatin (LIPITOR) 20 MG tablet Take 20 mg by mouth every evening.      Bioflavonoid Products (ESTER C PO) Take 1,000 mg by mouth daily.     Calcium Citrate-Vitamin D (CITRACAL + D PO) Take 1 tablet by mouth daily.     capecitabine (XELODA) 500 MG tablet Take 4 tablets (2,000 mg total) by mouth 2 (two) times daily after a meal. 112 tablet 3   cholecalciferol (VITAMIN D3) 25 MCG (1000 UNIT) tablet Take 1,000 Units by mouth daily.     iron polysaccharides (NIFEREX) 150 MG capsule Take 150 mg by mouth 2 (two) times daily.     losartan (COZAAR) 100 MG tablet Take 100 mg by mouth every evening.      metoprolol tartrate (LOPRESSOR) 100 MG tablet TAKE 1 TABLET BY MOUTH TWICE A DAY (Patient taking differently: Take 100 mg by mouth 2 (two) times daily.) 60 tablet 2   omeprazole (PRILOSEC) 40 MG capsule TAKE 1 CAPSULE BY MOUTH TWICE A DAY 180 capsule 1   OVER THE COUNTER MEDICATION Take 1 tablet by mouth 2 (two) times daily. Glucocil otc supplement     Semaglutide (OZEMPIC, 0.25 OR 0.5 MG/DOSE, [place]) Inject 0.5 mg into the skin every Monday.      Vitamin D, Ergocalciferol, (DRISDOL) 50000 UNITS CAPS capsule Take 50,000 Units by mouth every Wednesday.     zinc gluconate 50 MG tablet Take 50 mg by mouth daily.     No current facility-administered medications for this visit.    ALLERGIES:  Allergies  Allergen Reactions   Ace Inhibitors Cough   Lisinopril Cough   Xarelto [Rivaroxaban] Other (See Comments)    Heart out of rhythm    Quinine Derivatives Rash    PHYSICAL EXAM:  Performance status (ECOG): 1 - Symptomatic but completely ambulatory  Vitals:   10/13/20 1438  BP: 126/72  Pulse: (!) 59  Resp: 20  Temp: 99.1 F (37.3 C)  SpO2: 98%   Wt Readings  from Last 3 Encounters:  10/13/20 (!) 300 lb 0.7 oz (136.1 kg)  09/30/20 (!) 300 lb 6.4 oz (  136.3 kg)  09/21/20 (!) 300 lb 6.4 oz (136.3 kg)   Physical Exam Vitals reviewed.  Constitutional:      Appearance: Normal appearance. He is obese.  Cardiovascular:     Rate and Rhythm: Normal rate and regular rhythm.     Pulses: Normal pulses.     Heart sounds: Normal heart sounds.  Pulmonary:     Effort: Pulmonary effort is normal.     Breath sounds: Normal breath sounds.  Neurological:     General: No focal deficit present.     Mental Status: He is alert and oriented to person, place, and time.  Psychiatric:        Mood and Affect: Mood normal.        Behavior: Behavior normal.     LABORATORY DATA:  I have reviewed the labs as listed.  CBC Latest Ref Rng & Units 10/13/2020 10/02/2020 10/01/2020  WBC 4.0 - 10.5 K/uL 6.4 7.9 9.9  Hemoglobin 13.0 - 17.0 g/dL 10.3(L) 8.6(L) 9.3(L)  Hematocrit 39.0 - 52.0 % 34.2(L) 28.1(L) 29.5(L)  Platelets 150 - 400 K/uL 384 330 342   CMP Latest Ref Rng & Units 10/13/2020 10/02/2020 10/01/2020  Glucose 70 - 99 mg/dL 105(H) 114(H) 168(H)  BUN 8 - 23 mg/dL 15 22 23   Creatinine 0.61 - 1.24 mg/dL 1.20 1.25(H) 1.33(H)  Sodium 135 - 145 mmol/L 137 137 139  Potassium 3.5 - 5.1 mmol/L 4.8 4.5 4.5  Chloride 98 - 111 mmol/L 105 107 105  CO2 22 - 32 mmol/L 23 21(L) 23  Calcium 8.9 - 10.3 mg/dL 9.3 8.7(L) 9.3  Total Protein 6.5 - 8.1 g/dL 7.6 - -  Total Bilirubin 0.3 - 1.2 mg/dL 0.7 - -  Alkaline Phos 38 - 126 U/L 108 - -  AST 15 - 41 U/L 20 - -  ALT 0 - 44 U/L 28 - -    DIAGNOSTIC IMAGING:  I have independently reviewed the scans and discussed with the patient. No results found.   ASSESSMENT:  1.  Right-sided colon cancer: - He had 3 prior colonoscopies, last one 2 years ago by Dr. Wonda Cheng in [place], with multiple polyps removed. - Colonoscopy by Dr. Jenetta Downer on 08/19/2020 showed fungating infiltrative ulcerated partially obstructing large mass in the  proximal ascending colon.  Mass was partially circumferential.  7 sessile polyps found in the transverse colon, ascending colon and cecum. - Pathology consistent with adenocarcinoma of the biopsy of the mass.  Single fragment of at least intramucosal adenocarcinoma in the transverse colon polypectomy.  Pathology report has descending colon as adenocarcinoma. - CT CAP with contrast on 08/26/2020 shows circumferential lesion at the hepatic flexure of the colon consistent with primary colorectal carcinoma with no pathologically enlarged lymph node metastasis.  No liver metastasis.  Status post left nephrectomy.  No evidence of thoracic metastasis. - Even though descending colon polypectomy was reported as adenocarcinoma, Dr. Jenetta Downer felt that it was mislabeled.  Mass was reported in the proximal ascending colon on colonoscopy and close to hepatic flexure on CT scan. - Right hemicolectomy on 09/30/2020 - Pathology PT3PN1B, 2/30 lymph nodes positive, 1 tumor deposit, margins negative, MSI stable no lymphovascular or perineural invasion, grade 2, no perforation.  2.  Left kidney renal cell carcinoma: - Incidentally found on work-up for kidney stones. - Left nephrectomy by Dr. Gilford Rile on 10/06/2019 with pathology showing clear-cell renal cell carcinoma, grade 2, 6 cm, margins negative.  1 benign lymph node.  PT1BPN0.  3.  Social/family history: -  He is a disabled.  He worked at [employer] and a [employer].  He might have exposed to some chemicals at the later job.  No prior history of smoking. - Family history significant for mother with colon cancer, father with colon cancer and maternal grandfather with bone cancer.   PLAN:  1.  Stage III (PT3PN1B) ascending colon adenocarcinoma, MSI stable: - We have discussed in detail about pathology report. - We have discussed recommendation of adjuvant chemotherapy based on NCCN guidelines. - We have also discussed "IDEA trial" which has  shown 3 months of adjuvant CAPEOX is noninferior to 6 months of adjuvant FOLFOX with less neuropathy. - We will proceed with CAPEOX every 3 weeks for 4 cycles. - We will get PICC line placed at the time of first treatment. - We discussed chemotherapy side effects in detail.  2.  Personal and family history: - We have recommended genetic testing because of personal and family history of colon cancer.  We will obtain results in 2 weeks.  3.  Peripheral neuropathy: - He has mild grade 1 neuropathy with numbness in the fingertips and toes.  This is from underlying diabetes. - We discussed the possibility of worsening of neuropathy from chemotherapy.  We will closely monitor and dose reduce at the first hint of worsening neuropathy.    Orders placed this encounter:  Orders Placed This Encounter  Procedures   CBC with Differential   Comprehensive metabolic panel   Magnesium   CEA   Total time spent is 40 minutes with more than 80% of the time spent face-to-face discussing pathology report, treatment options, adverse effects, counseling and coordination of care.  Derek Jack, MD [place] [phone]   I, Thana Ates, am acting as a scribe for Dr. Derek Jack.  I, Derek Jack MD, have reviewed the above documentation for accuracy and completeness, and I agree with the above.

## 2020-10-13 ENCOUNTER — Other Ambulatory Visit: Payer: Self-pay

## 2020-10-13 ENCOUNTER — Telehealth (HOSPITAL_COMMUNITY): Payer: Self-pay | Admitting: Pharmacy Technician

## 2020-10-13 ENCOUNTER — Inpatient Hospital Stay (HOSPITAL_COMMUNITY): Payer: Medicare HMO | Attending: Hematology | Admitting: Hematology

## 2020-10-13 ENCOUNTER — Inpatient Hospital Stay (HOSPITAL_COMMUNITY): Payer: Medicare HMO

## 2020-10-13 ENCOUNTER — Other Ambulatory Visit (HOSPITAL_COMMUNITY): Payer: Self-pay

## 2020-10-13 VITALS — BP 126/72 | HR 59 | Temp 99.1°F | Resp 20 | Wt 300.0 lb

## 2020-10-13 DIAGNOSIS — C642 Malignant neoplasm of left kidney, except renal pelvis: Secondary | ICD-10-CM | POA: Insufficient documentation

## 2020-10-13 DIAGNOSIS — E785 Hyperlipidemia, unspecified: Secondary | ICD-10-CM | POA: Insufficient documentation

## 2020-10-13 DIAGNOSIS — K219 Gastro-esophageal reflux disease without esophagitis: Secondary | ICD-10-CM | POA: Insufficient documentation

## 2020-10-13 DIAGNOSIS — Z5111 Encounter for antineoplastic chemotherapy: Secondary | ICD-10-CM | POA: Insufficient documentation

## 2020-10-13 DIAGNOSIS — K589 Irritable bowel syndrome without diarrhea: Secondary | ICD-10-CM | POA: Diagnosis not present

## 2020-10-13 DIAGNOSIS — I4891 Unspecified atrial fibrillation: Secondary | ICD-10-CM | POA: Insufficient documentation

## 2020-10-13 DIAGNOSIS — Z79899 Other long term (current) drug therapy: Secondary | ICD-10-CM | POA: Insufficient documentation

## 2020-10-13 DIAGNOSIS — I10 Essential (primary) hypertension: Secondary | ICD-10-CM | POA: Diagnosis not present

## 2020-10-13 DIAGNOSIS — C189 Malignant neoplasm of colon, unspecified: Secondary | ICD-10-CM | POA: Diagnosis not present

## 2020-10-13 DIAGNOSIS — Z801 Family history of malignant neoplasm of trachea, bronchus and lung: Secondary | ICD-10-CM | POA: Insufficient documentation

## 2020-10-13 DIAGNOSIS — Z800 Family history of malignant neoplasm of digestive organs: Secondary | ICD-10-CM | POA: Diagnosis not present

## 2020-10-13 DIAGNOSIS — C182 Malignant neoplasm of ascending colon: Secondary | ICD-10-CM | POA: Diagnosis not present

## 2020-10-13 DIAGNOSIS — G629 Polyneuropathy, unspecified: Secondary | ICD-10-CM | POA: Diagnosis not present

## 2020-10-13 DIAGNOSIS — E1140 Type 2 diabetes mellitus with diabetic neuropathy, unspecified: Secondary | ICD-10-CM | POA: Diagnosis not present

## 2020-10-13 LAB — CBC WITH DIFFERENTIAL/PLATELET
Abs Immature Granulocytes: 0.03 10*3/uL (ref 0.00–0.07)
Basophils Absolute: 0 10*3/uL (ref 0.0–0.1)
Basophils Relative: 1 %
Eosinophils Absolute: 0.2 10*3/uL (ref 0.0–0.5)
Eosinophils Relative: 3 %
HCT: 34.2 % — ABNORMAL LOW (ref 39.0–52.0)
Hemoglobin: 10.3 g/dL — ABNORMAL LOW (ref 13.0–17.0)
Immature Granulocytes: 1 %
Lymphocytes Relative: 14 %
Lymphs Abs: 0.9 10*3/uL (ref 0.7–4.0)
MCH: 25.6 pg — ABNORMAL LOW (ref 26.0–34.0)
MCHC: 30.1 g/dL (ref 30.0–36.0)
MCV: 84.9 fL (ref 80.0–100.0)
Monocytes Absolute: 0.6 10*3/uL (ref 0.1–1.0)
Monocytes Relative: 9 %
Neutro Abs: 4.8 10*3/uL (ref 1.7–7.7)
Neutrophils Relative %: 72 %
Platelets: 384 10*3/uL (ref 150–400)
RBC: 4.03 MIL/uL — ABNORMAL LOW (ref 4.22–5.81)
RDW: 15.8 % — ABNORMAL HIGH (ref 11.5–15.5)
WBC: 6.4 10*3/uL (ref 4.0–10.5)
nRBC: 0 % (ref 0.0–0.2)

## 2020-10-13 LAB — COMPREHENSIVE METABOLIC PANEL
ALT: 28 U/L (ref 0–44)
AST: 20 U/L (ref 15–41)
Albumin: 4.3 g/dL (ref 3.5–5.0)
Alkaline Phosphatase: 108 U/L (ref 38–126)
Anion gap: 9 (ref 5–15)
BUN: 15 mg/dL (ref 8–23)
CO2: 23 mmol/L (ref 22–32)
Calcium: 9.3 mg/dL (ref 8.9–10.3)
Chloride: 105 mmol/L (ref 98–111)
Creatinine, Ser: 1.2 mg/dL (ref 0.61–1.24)
GFR, Estimated: >60 mL/min (ref >60)
Glucose, Bld: 105 mg/dL — ABNORMAL HIGH (ref 70–99)
Potassium: 4.8 mmol/L (ref 3.5–5.1)
Sodium: 137 mmol/L (ref 135–145)
Total Bilirubin: 0.7 mg/dL (ref 0.3–1.2)
Total Protein: 7.6 g/dL (ref 6.5–8.1)

## 2020-10-13 MED ORDER — CAPECITABINE 500 MG PO TABS
2000.0000 mg | ORAL_TABLET | Freq: Two times a day (BID) | ORAL | 3 refills | Status: DC
  Filled 2020-10-13: qty 112, 14d supply, fill #0

## 2020-10-13 NOTE — Patient Instructions (Addendum)
[place] at [facility] Discharge Instructions  You were seen and examined by Dr. Delton Coombes.  You have been diagnosed with Stage III Colon Cancer. Dr. Delton Coombes recommended chemotherapy. It is a combination of pills and IV medications.  The most common side effects of chemotherapy is fatigue, diarrhea, nausea, neuropathy and cold sensitivity.  Chemotherapy would be for 3 months.   Follow-up would be blood work every 3 months with a CT scan every 6 months for 2 years followed by a yearly CT scan and blood work every 6 months for years 3-5. You will also need a colonoscopy in a year. Follow-up colonoscopies are determined based on the colonoscopy results in a year.   Thank you for choosing [place] at [facility] to provide your oncology and hematology care.  To afford each patient quality time with our provider, please arrive at least 15 minutes before your scheduled appointment time.   If you have a lab appointment with the [facility] please come in thru the Main Entrance and check in at the main information desk.  You need to re-schedule your appointment should you arrive 10 or more minutes late.  We strive to give you quality time with our providers, and arriving late affects you and other patients whose appointments are after yours.  Also, if you no show three or more times for appointments you may be dismissed from the clinic at the providers discretion.     Again, thank you for choosing [facility].  Our hope is that these requests will decrease the amount of time that you wait before being seen by our physicians.       _____________________________________________________________  Should you have questions after your visit to [facility], please contact our office at [phone] and follow the prompts.  Our office hours are 8:00 a.m. and 4:30 p.m. Monday - Friday.  Please note that voicemails left after  4:00 p.m. may not be returned until the following business day.  We are closed weekends and major holidays.  You do have access to a nurse 24-7, just call the main number to the clinic [phone] and do not press any options, hold on the line and a nurse will answer the phone.    For prescription refill requests, have your pharmacy contact our office and allow 72 hours.    Due to Covid, you will need to wear a mask upon entering the hospital. If you do not have a mask, a mask will be given to you at the Main Entrance upon arrival. For doctor visits, patients may have 1 support person age 43 or older with them. For treatment visits, patients can not have anyone with them due to social distancing guidelines and our immunocompromised population.

## 2020-10-13 NOTE — Telephone Encounter (Signed)
Oral Oncology Patient Advocate Encounter   Received notification from [facility] that prior authorization for Xeloda is required.   PA submitted on CoverMyMeds Key [id number] Status is pending   Oral Oncology Clinic will continue to follow.  Bradley Rodriguez Phone [phone] Fax [phone] 10/13/2020 4:56 PM

## 2020-10-13 NOTE — Progress Notes (Signed)
START ON PATHWAY REGIMEN - Colorectal     A cycle is every 21 days:     Capecitabine      Oxaliplatin   **Always confirm dose/schedule in your pharmacy ordering system**  Patient Characteristics: Postoperative without Neoadjuvant Therapy (Pathologic Staging), Colon, Stage III, Low Risk (pT1-3, pN1) Tumor Location: Colon Therapeutic Status: Postoperative without Neoadjuvant Therapy (Pathologic Staging) AJCC M Category: cM0 AJCC T Category: pT3 AJCC N Category: pN1b AJCC 8 Stage Grouping: IIIB Intent of Therapy: Curative Intent, Discussed with Patient

## 2020-10-14 ENCOUNTER — Other Ambulatory Visit (HOSPITAL_COMMUNITY): Payer: Self-pay

## 2020-10-14 LAB — CEA: CEA: 1.1 ng/mL (ref 0.0–4.7)

## 2020-10-14 NOTE — Telephone Encounter (Signed)
Oral Oncology Patient Advocate Encounter   Received notification from [facility] that prior authorization for Xeloda is required.   PA submitted on CoverMyMeds Key [id number] Status is pending   Oral Oncology Clinic will continue to follow.  [place] Patient [place] Phone [phone] Fax [phone] 10/14/2020 8:50 AM

## 2020-10-14 NOTE — Telephone Encounter (Signed)
Oral Oncology Patient Advocate Encounter  Prior Authorization for Xeloda has been approved under part B benefit.    PA# [id number]  Effective dates: 10/13/20 through 01/21/21  Patients co-pay is $22.83  Oral Oncology Clinic will continue to follow.   [place] Patient [place] Phone [phone] Fax [phone] 10/14/2020 8:51 AM

## 2020-10-20 ENCOUNTER — Encounter (HOSPITAL_COMMUNITY): Payer: Self-pay

## 2020-10-20 ENCOUNTER — Telehealth (HOSPITAL_COMMUNITY): Payer: Self-pay | Admitting: Hematology

## 2020-10-20 ENCOUNTER — Other Ambulatory Visit (HOSPITAL_COMMUNITY): Payer: Self-pay

## 2020-10-20 ENCOUNTER — Encounter (HOSPITAL_COMMUNITY): Payer: Self-pay | Admitting: Hematology

## 2020-10-20 NOTE — Progress Notes (Signed)
Orders placed in sign and held for PICC line placement. I have called the IV team at [facility] and left my contact information and the patient's name, DOB, MRN and need for PICC line on 10/6 at 0815. Awaiting confirmation call back.

## 2020-10-20 NOTE — Progress Notes (Signed)
PICC line appt for 10/6 confirmed with Leda Gauze, RN with IV team

## 2020-10-21 ENCOUNTER — Telehealth (HOSPITAL_COMMUNITY): Payer: Self-pay | Admitting: Pharmacist

## 2020-10-21 ENCOUNTER — Other Ambulatory Visit (HOSPITAL_COMMUNITY): Payer: Self-pay

## 2020-10-21 DIAGNOSIS — E7849 Other hyperlipidemia: Secondary | ICD-10-CM | POA: Diagnosis not present

## 2020-10-21 DIAGNOSIS — I480 Paroxysmal atrial fibrillation: Secondary | ICD-10-CM | POA: Diagnosis not present

## 2020-10-21 DIAGNOSIS — Z7984 Long term (current) use of oral hypoglycemic drugs: Secondary | ICD-10-CM | POA: Diagnosis not present

## 2020-10-21 DIAGNOSIS — I10 Essential (primary) hypertension: Secondary | ICD-10-CM | POA: Diagnosis not present

## 2020-10-21 DIAGNOSIS — C182 Malignant neoplasm of ascending colon: Secondary | ICD-10-CM

## 2020-10-21 DIAGNOSIS — E1165 Type 2 diabetes mellitus with hyperglycemia: Secondary | ICD-10-CM | POA: Diagnosis not present

## 2020-10-21 MED ORDER — CAPECITABINE 500 MG PO TABS
2000.0000 mg | ORAL_TABLET | Freq: Two times a day (BID) | ORAL | 3 refills | Status: DC
  Filled 2020-10-21: qty 112, 21d supply, fill #0
  Filled 2020-10-21: qty 112, 14d supply, fill #0
  Filled 2020-11-10 (×2): qty 112, 21d supply, fill #1
  Filled 2020-11-29: qty 112, 21d supply, fill #2
  Filled 2020-12-20: qty 112, 21d supply, fill #3

## 2020-10-21 NOTE — Telephone Encounter (Signed)
Oral Oncology Patient Advocate Encounter  I spoke with Bradley Rodriguez this morning to set up delivery of Xeloda.  Address verified for shipment.  Xeloda will be filled through [facility] and mailed 10/24/20 for delivery 10/25/20.    [name] will call 7-10 days before next refill is due to complete adherence call and set up delivery of medication.     [place] Patient [place] Phone [phone] Fax [phone] 10/21/2020 11:16 AM

## 2020-10-21 NOTE — Telephone Encounter (Signed)
Oral Oncology Pharmacist Encounter  Received new prescription for Xeloda (capecitabine) for the adjuvant treatment of stage III colon cancer in conjunction with oxaliplatin, planned duration of 3 months. Planned start 10/27/20.  CMP from 10/13/20 assessed, no relevant lab abnormalities. Prescription dose and frequency assessed.   Current medication list in Epic reviewed, one DDIs with capecitabine identified: - Omeprazole: Proton Pump Inhibitors (PPI) may diminish the therapeutic effect of capecitabine, varying information on the clinical impact. Recommend evaluating the need for a PPI/acid suppression. If acid suppression is needed, attempt switching to a H2 antagonist (eg, famotidine) if possible.  Evaluated chart and no patient barriers to medication adherence identified.   Prescription has been e-scribed to the [facility] for benefits analysis and approval.  Oral Oncology Clinic will continue to follow for insurance authorization, copayment issues, initial counseling and start date.   Darl Pikes, PharmD, BCPS, BCOP, CPP Hematology/Oncology Clinical Pharmacist Practitioner [facility] [place] [facility] [phone]  10/21/2020 9:36 AM

## 2020-10-21 NOTE — Telephone Encounter (Signed)
Oral Chemotherapy Pharmacist Encounter  [place] will delivery capecitabine on 10/25/20. Mr. Rufener will begin taking his capecitabine on 10/27/20 along with his infusion treatment.  Patient Education I spoke with patient for overview of new oral chemotherapy medication: Xeloda (capecitabine) for the adjuvant treatment of stage III colon cancer in conjunction with oxaliplatin, planned duration of 3 months. Planned start 10/27/20.  Pt is doing well. Counseled patient on administration, dosing, side effects, monitoring, drug-food interactions, safe handling, storage, and disposal. Patient will take 4 tablets (2,000 mg total) by mouth 2 (two) times daily after a meal. Take for 14 days, then hold for 7 days. Repeat every 21 days.  Side effects include but not limited to: diarrhea, hand-foot syndrome, mouth sores, edema, decreased wbc, fatigue, N/V.   Diarrhea: patient plans on picking up loperamide to have on hand and use prn Hand-foot syndrome: patient has urea cream at home already, he will use this on his hands and feet while on capecitabine Mouth sores: patient will call for magic mouthwash if needed  Reviewed with patient importance of keeping a medication schedule and plan for any missed doses.  After discussion with patient no patient barriers to medication adherence identified.   Mr. Cranston voiced understanding and appreciation. All questions answered. Medication calendar and handout provided.  Provided patient with [facility] phone number. Patient knows to call the office with questions or concerns. Oral Chemotherapy Navigation Clinic will continue to follow.  Darl Pikes, PharmD, BCPS, BCOP, CPP Hematology/Oncology Clinical Pharmacist Practitioner [facility] [phone]  10/21/2020 11:05 AM

## 2020-10-24 ENCOUNTER — Inpatient Hospital Stay (HOSPITAL_COMMUNITY): Payer: Medicare HMO | Attending: Hematology

## 2020-10-24 ENCOUNTER — Other Ambulatory Visit (HOSPITAL_COMMUNITY): Payer: Self-pay

## 2020-10-24 DIAGNOSIS — I4891 Unspecified atrial fibrillation: Secondary | ICD-10-CM | POA: Insufficient documentation

## 2020-10-24 DIAGNOSIS — C182 Malignant neoplasm of ascending colon: Secondary | ICD-10-CM | POA: Insufficient documentation

## 2020-10-24 DIAGNOSIS — E785 Hyperlipidemia, unspecified: Secondary | ICD-10-CM | POA: Insufficient documentation

## 2020-10-24 DIAGNOSIS — Z5111 Encounter for antineoplastic chemotherapy: Secondary | ICD-10-CM | POA: Insufficient documentation

## 2020-10-24 DIAGNOSIS — R200 Anesthesia of skin: Secondary | ICD-10-CM | POA: Insufficient documentation

## 2020-10-24 DIAGNOSIS — E1142 Type 2 diabetes mellitus with diabetic polyneuropathy: Secondary | ICD-10-CM | POA: Insufficient documentation

## 2020-10-24 DIAGNOSIS — K580 Irritable bowel syndrome with diarrhea: Secondary | ICD-10-CM | POA: Insufficient documentation

## 2020-10-24 DIAGNOSIS — I10 Essential (primary) hypertension: Secondary | ICD-10-CM | POA: Insufficient documentation

## 2020-10-24 DIAGNOSIS — K589 Irritable bowel syndrome without diarrhea: Secondary | ICD-10-CM | POA: Insufficient documentation

## 2020-10-24 DIAGNOSIS — K219 Gastro-esophageal reflux disease without esophagitis: Secondary | ICD-10-CM | POA: Insufficient documentation

## 2020-10-24 DIAGNOSIS — C642 Malignant neoplasm of left kidney, except renal pelvis: Secondary | ICD-10-CM | POA: Insufficient documentation

## 2020-10-24 DIAGNOSIS — G629 Polyneuropathy, unspecified: Secondary | ICD-10-CM | POA: Insufficient documentation

## 2020-10-24 MED ORDER — PROCHLORPERAZINE MALEATE 10 MG PO TABS: 10.0000 mg | ORAL_TABLET | Freq: Four times a day (QID) | ORAL | 1 refills | Status: DC | PRN

## 2020-10-24 NOTE — Patient Instructions (Addendum)
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  You will be treated with a chemotherapy regimen called CapeOx - the drugs included in this are capcitabine (Xeloda) and oxaliplatin.  You will take the Xeloda two weeks on and one week off. You will repeat this cycle every 3 weeks.  You will receive the oxaliplatin in the clinic every 21 days at the beginning of each cycle of chemotherapy.  The intent of treatment is cure.  You will see the doctor regularly throughout treatment.  We will obtain blood work from you prior to every treatment and monitor your results to make sure it is safe to give your treatment. The doctor monitors your response to treatment by the way you are feeling, your blood work, and by obtaining scans periodically. There will be wait times while you are here for treatment.  It will take about 30 minutes to 1 hour for your lab work to result.  Then there will be wait times while pharmacy mixes your medications.    Capecitabine (Xeloda)  About This Drug Capecitabine is used to treat cancer. It is given orally (by mouth).  You will take this drug for two weeks in a row, then have a one week break before restarting it again.   Possible Side Effects  Decrease in red blood cells. This may make you feel more tired.   Nausea and throwing up (vomiting)   Pain in your abdomen   Diarrhea (loose bowel movements)   Tiredness and weakness   Increased total bilirubin in your blood. This may mean that you have changes in your liver function.   Hand-foot syndrome. The palms of your hands or soles of your feet may tingle, become numb, painful, swollen, or red.  Note: Each of the side effects above was reported in 30% or greater of patients treated with capecitabine. Not all possible side effects are included above.  Warnings and Precautions  Abnormal bleeding if you are taking blood thinners such as warfarin -  symptoms may be coughing up blood, throwing up blood (may look like coffee grounds), red or black tarry bowel movements, abnormally heavy menstrual flow, nosebleeds or any other unusual bleeding.   Severe diarrhea   Changes in the tissue of the heart and/or heart attack. Some changes may happen that can cause your heart to have less ability to pump blood.   Increased risk of severe side effects if you have a known dihydropyrimidine dehydrogenase deficiency   Dehydration (lack of water in the body from losing too much fluid), which may affect how your kidneys work which can be life-threatening   Severe allergic skin reaction. You may develop blisters on your skin that are filled with fluid or a severe red rash all over your body that may be painful.   Decrease in the number of white blood cells, red blood cells, and platelets. This may raise your risk of infection, make you tired and weak (fatigue), and raise your risk of bleeding.   Patients greater than 13 years of age are at increased risk of severe and life-threatening side effects.   Increased bilirubin and changes in your liver function, which can cause liver failure  Note: Some of the side effects above are very rare. If you have concerns and/or questions, please discuss them with your medical team.  How to Take Your Medication  Swallow the medicine whole with water within 30 minutes after a meal. Do  not break or crush it.   Missed dose: If you vomit or miss a dose, take your next dose at the regular time, and contact your doctor. Do not take 2 doses at the same time and do not double up on the next dose.   Handling: Wash your hands after handling your medicine, your caretakers should not handle your medicine with bare hands and should wear latex gloves.  This drug may be present in the saliva, tears, sweat, urine, stool, vomit, semen, and vaginal secretions. Talk to your doctor and/or your nurse about the necessary precautions to  take during this time.   Storage: Store this medicine in the original container at room temperature. Keep lid tightly closed.   Disposal of unused medicine: Do not flush any expired and/or unused medicine down the toilet or drain unless you are specifically instructed to do so on the medication label. Some facilities have take-back programs and/or other options. If you do not have a take-back program in your area, then please discuss with your nurse or your doctor how to dispose of unused medicine.  Treating Side Effects  Drink plenty of fluids (a minimum of eight glasses per day is recommended).   If you throw up or have loose bowel movements, you should drink more fluids so that you do not become dehydrated (lack of water in the body from losing too much fluid).   If you have diarrhea, eat low-fiber foods that are high in protein and calories and avoid foods that can irritate your digestive tracts or lead to cramping.   Ask your nurse or doctor about medicine that can lessen or stop your diarrhea.   To help with nausea and vomiting, eat small, frequent meals instead of three large meals a day. Choose foods and drinks that are at room temperature. Ask your nurse or doctor about other helpful tips and medicine that is available to help stop or lessen these symptoms.   Manage tiredness by pacing your activities for the day.   Be sure to include periods of rest between energy-draining activities.   To decrease the risk of infection, wash your hands regularly.   Avoid close contact with people who have a cold, the flu, or other infections.   Take your temperature as your doctor or nurse tells you, and whenever you feel like you may have a fever.   To help decrease the risk of bleeding, use a soft toothbrush. Check with your nurse before using dental floss.   Be very careful when using knives or tools.   Use an electric shaver instead of a razor.   Keeping your pain under control is  important to your well-being. Please tell your doctor or nurse if you are experiencing pain.   Avoid sun exposure and apply sunscreen routinely when outdoors.   If you get a rash do not put anything on it unless your doctor or nurse says you may. Keep the area around the rash clean and dry. Ask your doctor for medicine if your rash bothers you.  Food and Drug Interactions  There are no known interactions of capecitabine with food, however this medication should be taken within 30 minutes after a meal.   Check with your doctor or pharmacist about all other prescription medicines and over-the-counter medicines and dietary supplements (vitamins, minerals, herbs and others) you are taking before starting this medicine as there are known drug interactions with capecitabine. Also, check with your doctor or pharmacist before starting any new  prescription or over-the-counter medicines, or dietary supplements to make sure that there are no interactions.    There are known interactions of capecitabine with blood thinning medicine such as warfarin. Ask your doctor what precautions you should take.  When to Call the Doctor  Call your doctor or nurse if you have any of these symptoms and/or any new or unusual symptoms:  Fever of 100.4 F (38 C) or higher   Chills   Trouble breathing   Feeling that your heart is beating in a fast or not normal way (palpitations)   Pain in your chest   Chest pain or symptoms of a heart attack. Most heart attacks involve pain in the center of the chest that lasts more than a few minutes. The pain may go away and come back or it can be constant. It can feel like pressure, squeezing, fullness, or pain. Sometimes pain is felt in one or both arms, the back, neck, jaw, or stomach. If any of these symptoms last 2 minutes, call 911.   Tiredness that interferes with your daily activities   Feeling dizzy or lightheaded   Easy bleeding or bruising   Blood in your urine,  vomit (bright red or coffee-ground) and/or stools (bright red, or black/tarry)   Coughing up blood   Decreased or very dark urine   Nausea that stops you from eating or drinking and/or is not relieved by prescribed medicines   Throwing up   Lasting loss of appetite or rapid weight loss of five pounds in a week   Diarrhea, 4 times in one day or diarrhea with lack of strength or a feeling of being dizzy   Pain that does not go away or is not relieved by prescribed medicines   Signs of possible liver problems: dark urine, pale bowel movements, bad stomach pain, feeling very tired and weak, unusual itching, or yellowing of the eyes or skin   Painful, red, or swollen areas on your hands or feet   Numbness and/or tingling of your hands and/or feet   A new rash or a rash that is not relieved by prescribed medicines   Flu-like symptoms: fever, headache, muscle and joint aches, and fatigue (low energy, feeling weak)   If you think you may be pregnant or may have impregnated your partner  Reproduction Warnings  Pregnancy warning: This drug can have harmful effects on the unborn baby. Women of childbearing potential should use effective methods of birth control during your cancer treatment and for 6 months after treatment. Men with male partners of childbearing potential should use effective methods of birth control during your cancer treatment and for 3 months after your cancer treatment. Let your doctor know right away if you think you may be pregnant or may have impregnated your partner.   Breastfeeding warning: Women should not breastfeed during treatment and for 2 weeks after treatment because this drug could enter the breast milk and cause harm to a breastfeeding baby.    Fertility warning: In men and women both, this drug may affect your ability to have children in the future. Talk with your doctor or nurse if you plan to have children. Ask for information on sperm or egg  banking.   Oxaliplatin (Eloxatin)  About This Drug Oxaliplatin is used to treat cancer. It is given in the vein (IV) through your port a cath.  It will take 2 hours to infuse.   Possible Side Effects  Bone marrow suppression. This is a decrease  in the number of white blood cells, red blood cells, and platelets. This may raise your risk of infection, make you tired and weak (fatigue), and raise your risk of bleeding.   Tiredness   Soreness of the mouth and throat. You may have red areas, white patches, or sores that hurt.   Nausea and vomiting (throwing up)   Diarrhea (loose bowel movements)   Changes in your liver function   Effects on the nerves called peripheral neuropathy. You may feel numbness, tingling, or pain in your hands and feet, and it may be worse in cold temperatures. It may be hard for you to button your clothes, open jars, or walk as usual. You may also have eye pain, jaw spasms, difficulty with swallowing and/or an abnormal feeling of your tongue as well as a heavy feeling in your chest. The effect on the nerves may get worse with more doses of the drug. These effects get better in some people after the drug is stopped but it does not get better in all people.  Note: Each of the side effects above was reported in 40% or greater of patients treated with oxaliplatin. Not all possible side effects are included above.  Warnings and Precautions  Allergic reactions, including anaphylaxis, which may be life-threatening. Signs of allergic reaction to this drug may be swelling of the face, feeling like your tongue or throat are swelling, trouble breathing, rash, itching, fever, chills, feeling dizzy, and/or feeling that your heart is beating in a fast or not normal way. If this happens, do not take another dose of this drug. You should get urgent medical treatment.   Thickening and/or inflammation (swelling) of the lung tissues, which may be life-threatening. You may have a dry  cough or trouble breathing.   This medicine can make you more sensitive to cold, which may cause or worsen nerve problems.   Changes in your central nervous system can happen. The central nervous system is made up of your brain and spinal cord. You could feel extreme tiredness, agitation, confusion, have hallucinations (see or hear things that are not there), trouble understanding or speaking, loss of control of your bowels or bladder, eyesight changes, numbness or lack of strength to your arms, legs, face, or body, seizures or coma. If you start to have any of these symptoms let your doctor know right away.   Severe decrease in white blood cells and platelets when combined with fluorouracil and leucovorin. This may be life-threatening.   Severe changes in your liver function   Abnormal heartbeat and/or EKG, which can be life-threatening   Rhabdomyolysis- damage to your muscles which may release proteins in your blood and affect how your kidneys work, which may be life-threatening. You may have severe muscle weakness and/or pain, or dark urine.   Abnormal bleeding - symptoms may be coughing up blood, throwing up blood (may look like coffee grounds), red or black tarry bowel movements, abnormally heavy menstrual flow, nosebleeds, or any other unusual bleeding.  Note: Some of the side effects above are very rare. If you have concerns and/or questions, please discuss them with your medical team.  Important Information  This drug may impair your ability to drive or use machinery. Talk to your doctor and/or nurse about precautions you may need to take.  This drug may be present in the saliva, tears, sweat, urine, stool, vomit, semen, and vaginal secretions. Talk to your doctor and/or your nurse about the necessary precautions to take during this time.  Treating Side Effects  Manage tiredness by pacing your activities for the day.   Be sure to include periods of rest between energy-draining  activities.   To decrease the risk of infection, wash your hands regularly.   Avoid close contact with people who have a cold, the flu, or other infections.   Take your temperature as your doctor or nurse tells you, and whenever you feel like you may have a fever.   To help decrease the risk of bleeding, use a soft toothbrush. Check with your nurse before using dental floss.   Be very careful when using knives or tools.   Use an electric shaver instead of a razor.   Drink plenty of fluids (a minimum of eight glasses per day is recommended).   Mouth care is very important. Your mouth care should consist of routine, gentle cleaning of your teeth or dentures and rinsing your mouth with a mixture of 1/2 teaspoon of salt in 8 ounces of water or 1/2 teaspoon of baking soda in 8 ounces of water. This should be done at least after each meal and at bedtime.   If you have mouth sores, avoid mouthwash that has alcohol. Also avoid alcohol and smoking because they can bother your mouth and throat.   To help with nausea and vomiting, eat small, frequent meals instead of three large meals a day. Choose foods and drinks that are at room temperature. Ask your nurse or doctor about other helpful tips and medicine that is available to help stop or lessen these symptoms.   If you throw up or have loose bowel movements, you should drink more fluids so that you do not become dehydrated (lack of water in the body from losing too much fluid).   If you have diarrhea, eat low-fiber foods that are high in protein and calories and avoid foods that can irritate your digestive tracts or lead to cramping.   Ask your nurse or doctor about medicine that can lessen or stop your diarrhea.   If you have numbness and tingling in your hands and feet, be careful when cooking, walking, and handling sharp objects and hot liquids.   Do not drink cold drinks or use ice. Cover exposed skin before coming in contact with cold  temperatures or cold objects. When out in cold weather wear warm clothing and cover your mouth and nose to warm the air that goes into your lungs. Tell your doctor if you get sensitive to the cold.  Food and Drug Interactions  There are no known interactions of oxaliplatin with food.   Check with your doctor or pharmacist about all other prescription medicines and over-the-counter medicines and dietary supplements (vitamins, minerals, herbs, and others) you are taking before starting this medicine as there are known drug interactions with oxaliplatin. Also, check with your doctor or pharmacist before starting any new prescription or over-the-counter medicines, or dietary supplements to make sure that there are no interactions.   There are known interactions of oxaliplatin when given in combination with fluorouracil and leucovorin and with blood thinning medicine such as warfarin. Ask your doctor what precautions you should take.  When to Call the Doctor  Call your doctor or nurse if you have any of these symptoms and/or any new or unusual symptoms:  Fever of 100.4 F (38 C) or higher   Chills   Tiredness that interferes with your daily activities   Feeling dizzy or lightheaded   Easy bleeding or bruising  Extreme tiredness, agitation, or confusion   Symptoms of a seizure such as confusion, blacking out, passing out, loss of hearing or vision, blurred vision, unusual smells or tastes (such as burning rubber), trouble talking, tremors or shaking in parts or all of the body, repeated body movements, tense muscles that do not relax, and loss of control of urine and bowels. If you or your family member suspects you are having a seizure, call 911 right away.   Hallucinations   Trouble understanding or speaking   Loss of control of bowels or bladder   Blurry vision or changes in your eyesight such as pain in your eyes   Numbness or lack of strength to your arms, legs, face, or body    Feeling that your heart is beating in a fast or not normal way (palpitations)   Pain or heavy feeling in your chest   Dry cough or coughing up blood   Trouble breathing   Difficulty swallowing or an abnormal feeling of your tongue   Pain in your mouth or throat that makes it hard to eat or drink   Nausea that stops you from eating or drinking and/or is not relieved by prescribed medicines   Throwing up   Diarrhea, 4 times in one day or diarrhea with lack of strength or a feeling of being dizzy   Numbness, tingling, or pain in your hands and feet   Signs of possible liver problems: dark urine, pale bowel movements, bad stomach pain, feeling very tired and weak, unusual itching, or yellowing of the eyes or skin   Blood in your urine, vomit (bright red or coffee-ground) and/or stools (bright red, or black/tarry)   Signs of rhabdomyolysis: decreased urine, very dark urine, muscle pain in the shoulders, thighs, or lower back; muscle weakness or trouble moving arms and legs   Signs of allergic reaction: swelling of the face, feeling like your tongue or throat are swelling, trouble breathing, rash, itching, fever, chills, feeling dizzy, and/or feeling that your heart is beating in a fast or not normal way. If this happens, call 911 for emergency care.   If you think you may be pregnant or may have impregnated your partner  Reproduction Warnings  Pregnancy warning: This drug may have harmful effects on the unborn baby. Women of childbearing potential should use effective methods of birth control during your cancer treatment and for 9 months after your treatment. Men with male partners of childbearing potential should use effective methods of birth control during your cancer treatment and for 6 months after your cancer treatment. Let your doctor know right away if you think you may be pregnant or may have impregnated your partner.   Breastfeeding warning: It is not known if this drug  passes into breast milk. For this reason, women should not breastfeed during treatment and for 3 months after treatment because this drug could enter the breast milk and cause harm to a breastfeeding baby..   Fertility warning: In men and women both, this drug may affect your ability to have children in the future. Talk with your doctor or nurse if you plan to have children. Ask for information on sperm or egg banking.   SELF CARE ACTIVITIES WHILE RECEIVING CHEMOTHERAPY:  Hydration Increase your fluid intake 48 hours prior to treatment and drink at least 8 to 12 cups (64 ounces) of water/decaffeinated beverages per day after treatment. You can still have your cup of coffee or soda but these beverages do not count  as part of your 8 to 12 cups that you need to drink daily. No alcohol intake.  Medications Continue taking your normal prescription medication as prescribed.  If you start any new herbal or new supplements please let us know first to make sure it is safe.  Mouth Care Have teeth cleaned professionally before starting treatment. Keep dentures and partial plates clean. Use soft toothbrush and do not use mouthwashes that contain alcohol. Biotene is a good mouthwash that is available at most pharmacies or may be ordered by calling [phone]. Use warm salt water gargles (1 teaspoon salt per 1 quart warm water) before and after meals and at bedtime. If you need dental work, please let the doctor know before you go for your appointment so that we can coordinate the best possible time for you in regards to your chemo regimen. You need to also let your dentist know that you are actively taking chemo. We may need to do labs prior to your dental appointment.  Skin Care Always use sunscreen that has not expired and with SPF (Sun Protection Factor) of 50 or higher. Wear hats to protect your head from the sun. Remember to use sunscreen on your hands, ears, face, & feet.  Use good moisturizing lotions  such as udder cream, eucerin, or even Vaseline. Some chemotherapies can cause dry skin, color changes in your skin and nails.    Avoid long, hot showers or baths. Use gentle, fragrance-free soaps and laundry detergent. Use moisturizers, preferably creams or ointments rather than lotions because the thicker consistency is better at preventing skin dehydration. Apply the cream or ointment within 15 minutes of showering. Reapply moisturizer at night, and moisturize your hands every time after you wash them.  Hair Loss (if your doctor says your hair will fall out)  If your doctor says that your hair is likely to fall out, decide before you begin chemo whether you want to wear a wig. You may want to shop before treatment to match your hair color. Hats, turbans, and scarves can also camouflage hair loss, although some people prefer to leave their heads uncovered. If you go bare-headed outdoors, be sure to use sunscreen on your scalp. Cut your hair short. It eases the inconvenience of shedding lots of hair, but it also can reduce the emotional impact of watching your hair fall out. Don't perm or color your hair during chemotherapy. Those chemical treatments are already damaging to hair and can enhance hair loss. Once your chemo treatments are done and your hair has grown back, it's OK to resume dyeing or perming hair.  With chemotherapy, hair loss is almost always temporary. But when it grows back, it may be a different color or texture. In older adults who still had hair color before chemotherapy, the new growth may be completely gray.  Often, new hair is very fine and soft.  Infection Prevention Please wash your hands for at least 30 seconds using warm soapy water. Handwashing is the #1 way to prevent the spread of germs. Stay away from sick people or people who are getting over a cold. If you develop respiratory systems such as green/yellow mucus production or productive cough or persistent cough let us  know and we will see if you need an antibiotic. It is a good idea to keep a pair of gloves on when going into grocery stores/[employer] to decrease your risk of coming into contact with germs on the carts, etc. Carry alcohol hand gel with you  at all times and use it frequently if out in public. If your temperature reaches 100.5 or higher please call the clinic and let us know.  If it is after hours or on the weekend please go to the ER if your temperature is over 100.5.  Please have your own personal thermometer at home to use.    Sex and bodily fluids If you are going to have sex, a condom must be used to protect the person that isn't taking chemotherapy. Chemo can decrease your libido (sex drive). For a few days after chemotherapy, chemotherapy can be excreted through your bodily fluids.  When using the toilet please close the lid and flush the toilet twice.  Do this for a few day after you have had chemotherapy.   Effects of chemotherapy on your sex life Some changes are simple and won't last long. They won't affect your sex life permanently.  Sometimes you may feel: too tired not strong enough to be very active sick or sore  not in the mood anxious or low Your anxiety might not seem related to sex. For example, you may be worried about the cancer and how your treatment is going. Or you may be worried about money, or about how you family are coping with your illness.  These things can cause stress, which can affect your interest in sex. It's important to talk to your partner about how you feel.  Remember - the changes to your sex life don't usually last long. There's usually no medical reason to stop having sex during chemo. The drugs won't have any long term physical effects on your performance or enjoyment of sex. Cancer can't be passed on to your partner during sex  Contraception It's important to use reliable contraception during treatment. Avoid getting pregnant while you or your partner are  having chemotherapy. This is because the drugs may harm the baby. Sometimes chemotherapy drugs can leave a man or woman infertile.  This means you would not be able to have children in the future. You might want to talk to someone about permanent infertility. It can be very difficult to learn that you may no longer be able to have children. Some people find counselling helpful. There might be ways to preserve your fertility, although this is easier for men than for women. You may want to speak to a fertility expert. You can talk about sperm banking or harvesting your eggs. You can also ask about other fertility options, such as donor eggs. If you have or have had breast cancer, your doctor might advise you not to take the contraceptive pill. This is because the hormones in it might affect the cancer. It is not known for sure whether or not chemotherapy drugs can be passed on through semen or secretions from the vagina. Because of this some doctors advise people to use a barrier method if you have sex during treatment. This applies to vaginal, anal or oral sex. Generally, doctors advise a barrier method only for the time you are actually having the treatment and for about a week after your treatment. Advice like this can be worrying, but this does not mean that you have to avoid being intimate with your partner. You can still have close contact with your partner and continue to enjoy sex.  Animals If you have cats or birds we just ask that you not change the litter or change the cage.  Please have someone else do this for you while you are  on chemotherapy.   Food Safety During and After Cancer Treatment Food safety is important for people both during and after cancer treatment. Cancer and cancer treatments, such as chemotherapy, radiation therapy, and stem cell/bone marrow transplantation, often weaken the immune system. This makes it harder for your body to protect itself from foodborne illness, also called  food poisoning. Foodborne illness is caused by eating food that contains harmful bacteria, parasites, or viruses.  Foods to avoid Some foods have a higher risk of becoming tainted with bacteria. These include: Unwashed fresh fruit and vegetables, especially leafy vegetables that can hide dirt and other contaminants Raw sprouts, such as alfalfa sprouts Raw or undercooked beef, especially ground beef, or other raw or undercooked meat and poultry Fatty, fried, or spicy foods immediately before or after treatment.  These can sit heavy on your stomach and make you feel nauseous. Raw or undercooked shellfish, such as oysters. Sushi and sashimi, which often contain raw fish.  Unpasteurized beverages, such as unpasteurized fruit juices, raw milk, raw yogurt, or cider Undercooked eggs, such as soft boiled, over easy, and poached; raw, unpasteurized eggs; or foods made with raw egg, such as homemade raw cookie dough and homemade mayonnaise  Simple steps for food safety  Shop smart. Do not buy food stored or displayed in an unclean area. Do not buy bruised or damaged fruits or vegetables. Do not buy cans that have cracks, dents, or bulges. Pick up foods that can spoil at the end of your shopping trip and store them in a cooler on the way home.  Prepare and clean up foods carefully. Rinse all fresh fruits and vegetables under running water, and dry them with a clean towel or paper towel. Clean the top of cans before opening them. After preparing food, wash your hands for 20 seconds with hot water and soap. Pay special attention to areas between fingers and under nails. Clean your utensils and dishes with hot water and soap. Disinfect your kitchen and cutting boards using 1 teaspoon of liquid, unscented bleach mixed into 1 quart of water.    Dispose of old food. Eat canned and packaged food before its expiration date (the "use by" or "best before" date). Consume refrigerated leftovers within 3 to 4  days. After that time, throw out the food. Even if the food does not smell or look spoiled, it still may be unsafe. Some bacteria, such as Listeria, can grow even on foods stored in the refrigerator if they are kept for too long.  Take precautions when eating out. At restaurants, avoid buffets and salad bars where food sits out for a long time and comes in contact with many people. Food can become contaminated when someone with a virus, often a norovirus, or another "bug" handles it. Put any leftover food in a "to-go" container yourself, rather than having the server do it. And, refrigerate leftovers as soon as you get home. Choose restaurants that are clean and that are willing to prepare your food as you order it cooked.   AT HOME MEDICATIONS:  Compazine/Prochlorperazine 10mg  tablet. Take 1 tablet every 6 hours as needed for nausea/vomiting. (This can make you sleepy)   EMLA cream. Apply a quarter size amount to port site 1 hour prior to chemo. Do not rub in. Cover with plastic wrap.    Diarrhea Sheet   If you are having loose stools/diarrhea, please purchase Imodium and begin taking as outlined:  At the first sign of poorly formed or loose stools you should begin taking Imodium (loperamide) 2 mg capsules.  Take two tablets (4mg ) followed by one tablet (2mg ) every 2 hours - DO NOT EXCEED 8 tablets in 24 hours.  If it is bedtime and you are having loose stools, take 2 tablets at bedtime, then 2 tablets every 4 hours until morning.   Always call the [facility] if you are having loose stools/diarrhea that you can't get under control.  Loose stools/diarrhea leads to dehydration (loss of water) in your body.  We have other options of trying to get the loose stools/diarrhea to stop but you must let us know!   Constipation Sheet  Colace  - 100 mg capsules - take 2 capsules daily.  If this doesn't help then you can increase to 2 capsules twice daily.  Please call if the above does not work for you. Do not go more than 2 days without a bowel movement.  It is very important that you do not become constipated.  It will make you feel sick to your stomach (nausea) and can cause abdominal pain and vomiting.  Nausea Sheet   Compazine/Prochlorperazine 10mg  tablet. Take 1 tablet every 6 hours as needed for nausea/vomiting (This can make you drowsy).  If you are having persistent nausea (nausea that does not stop) please call the [facility] and let us know the amount of nausea that you are experiencing.  If you begin to vomit, you need to call the [facility] and if it is the weekend and you have vomited more than one time and can't get it to stop-go to the Emergency Room.  Persistent nausea/vomiting can lead to dehydration (loss of fluid in your body) and will make you feel very weak and unwell. Ice chips, sips of clear liquids, foods that are at room temperature, crackers, and toast tend to be better tolerated.   SYMPTOMS TO REPORT AS SOON AS POSSIBLE AFTER TREATMENT:  FEVER GREATER THAN 100.4 F  CHILLS WITH OR WITHOUT FEVER  NAUSEA AND VOMITING THAT IS NOT CONTROLLED WITH YOUR NAUSEA MEDICATION  UNUSUAL SHORTNESS OF BREATH  UNUSUAL BRUISING OR BLEEDING  TENDERNESS IN MOUTH AND THROAT WITH OR WITHOUT   PRESENCE OF ULCERS  URINARY PROBLEMS  BOWEL PROBLEMS  UNUSUAL RASH      Wear comfortable clothing and clothing appropriate for easy access to any Portacath or PICC line. Let us know if there is anything that we can do to make your therapy better!    What to do if you need assistance after hours or on the weekends: CALL [phone].  HOLD on the line, do not hang up.  You will hear multiple messages but at the end you will be connected with a nurse triage line.  They will contact the doctor if necessary.  Most of the  time they will be able to assist you.  Do not call the hospital operator.      I have been informed and understand all of the instructions given to me and have received a copy. I have been instructed to call  the clinic [phone] or my family physician as soon as possible for continued medical care, if indicated. I do not have any more questions at this time but understand that I may call the [facility] or the Patient Navigator at [phone] during office hours should I have questions or need assistance in obtaining follow-up care.

## 2020-10-25 ENCOUNTER — Encounter (HOSPITAL_COMMUNITY): Payer: Self-pay | Admitting: Hematology

## 2020-10-25 DIAGNOSIS — B351 Tinea unguium: Secondary | ICD-10-CM | POA: Diagnosis not present

## 2020-10-25 DIAGNOSIS — M79676 Pain in unspecified toe(s): Secondary | ICD-10-CM | POA: Diagnosis not present

## 2020-10-25 DIAGNOSIS — E1142 Type 2 diabetes mellitus with diabetic polyneuropathy: Secondary | ICD-10-CM | POA: Diagnosis not present

## 2020-10-25 DIAGNOSIS — L84 Corns and callosities: Secondary | ICD-10-CM | POA: Diagnosis not present

## 2020-10-25 NOTE — Progress Notes (Signed)

## 2020-10-26 NOTE — Progress Notes (Signed)
[place], [place] [address]   CLINIC:  Medical Oncology/Hematology  PCP:  Caryl Bis, MD [address] [place] [address] [phone]   REASON FOR VISIT:  Follow-up for colon cancer  PRIOR THERAPY: Left nephrectomy by Dr. Gilford Rile on 10/06/2019   NGS Results: not done  CURRENT THERAPY: Colorectal Xelox every 3 weeks  BRIEF ONCOLOGIC HISTORY:  Oncology History  Cancer of ascending colon ([facility])  09/01/2020 Initial Diagnosis   Cancer of ascending colon ([facility])   10/26/2020 -  Chemotherapy   Patient is on Treatment Plan : COLORECTAL Xelox ([place]) q21d       CANCER STAGING: Cancer Staging Cancer of ascending colon ([facility]) Staging form: Colon and Rectum, AJCC 8th Edition - Clinical stage from 09/01/2020: Stage IIIB (cT3, cN1b, cM0) - Unsigned   INTERVAL HISTORY:  Bradley Rodriguez, a 60 y.o. male, returns for routine follow-up and consideration for next cycle of chemotherapy. Bradley Rodriguez was last seen on 10/13/2020.  Due for cycle #1 of Oxaliplatin today.   Overall, he tells me he has been feeling pretty well. He reports constant mild numbness in his fingers and toes due to DM which does not limit his fine motor skills, and he denies any associated pain. This has been present and stable for 2-3 years.   Overall, he feels ready for next cycle of chemo today.   REVIEW OF SYSTEMS:  Review of Systems  Constitutional:  Negative for appetite change and fatigue.  Neurological:  Positive for numbness (finger and toes').  All other systems reviewed and are negative.  PAST MEDICAL/SURGICAL HISTORY:  Past Medical History:  Diagnosis Date   A-fib [facility])    DCCV 2009, 2012, 2014, Nov 2016   Anxiety    Arthritis    Knees, wrist, ankles   Cancer ([facility]) 07/2019   Kidney   Diabetes mellitus without complication ([facility])    Dysrhythmia 1996   A- Fib   GERD (gastroesophageal reflux disease)    History of kidney stones    HX: anticoagulation     very remotely and briefly on COumadin aound one of his CV, 2014 on Eliquis had hematuria with renal calculi and stopped   Hyperlipidemia    patient denies this   Hypertension    IBS (irritable bowel syndrome)    Normal cardiac stress test    Normal nuclear stress test , 2001   Past Surgical History:  Procedure Laterality Date   BIOPSY  08/19/2020   Procedure: BIOPSY;  Surgeon: Harvel Quale, MD;  [facility];  Service: Gastroenterology;;   CARDIOVERSION N/A 10/15/2012   Procedure: CARDIOVERSION;  Surgeon: Larey Dresser, MD;  Location: [facility] ENDOSCOPY;  Service: Cardiovascular;  Laterality: N/A;   CARDIOVERSION N/A 11/26/2014   Procedure: CARDIOVERSION;  Surgeon: Arnoldo Lenis, MD;  Location: [facility] ORS;  Service: Endoscopy;  Laterality: N/A;   CARDIOVERSION N/A 11/15/2015   Procedure: CARDIOVERSION;  Surgeon: Jerline Pain, MD;  Location: [facility];  Service: Cardiovascular;  Laterality: N/A;   CARDIOVERSION N/A 08/07/2016   Procedure: CARDIOVERSION;  Surgeon: Herminio Commons, MD;  Location: [facility];  Service: Cardiovascular;  Laterality: N/A;   CARDIOVERSION N/A 09/11/2018   Procedure: CARDIOVERSION;  Surgeon: Arnoldo Lenis, MD;  Location: [facility];  Service: Endoscopy;  Laterality: N/A;   CATARACT EXTRACTION W/PHACO Right 01/18/2014   Procedure: CATARACT EXTRACTION PHACO AND INTRAOCULAR LENS PLACEMENT (IOC);  Surgeon: Tonny Branch, MD;  Location: [facility]  ORS;  Service: Ophthalmology;  Laterality: Right;  CDE 9.95   COLONOSCOPY WITH PROPOFOL N/A 08/19/2020   Procedure: COLONOSCOPY WITH PROPOFOL;  Surgeon: Harvel Quale, MD;  [facility];  Service: Gastroenterology;  Laterality: N/A;  8:20   CYSTOSCOPY WITH LITHOLAPAXY N/A 10/06/2019   Procedure: CYSTOSCOPY WITH LASER LITHOLAPAXY;  Surgeon: Ceasar Mons, MD;  Location: [facility];  Service: Urology;  Laterality: N/A;   ESOPHAGOGASTRODUODENOSCOPY (EGD) WITH PROPOFOL N/A 08/19/2020    Procedure: ESOPHAGOGASTRODUODENOSCOPY (EGD) WITH PROPOFOL;  Surgeon: Harvel Quale, MD;  [facility];  Service: Gastroenterology;  Laterality: N/A;   EYE SURGERY Right    detached retina   FOOT SURGERY Left    KNEE ARTHROSCOPY Right    LAPAROSCOPIC RIGHT HEMI COLECTOMY N/A 09/30/2020   Procedure: LAPAROSCOPIC RIGHT HEMI COLECTOMY;  Surgeon: Ileana Roup, MD;  [facility];  Service: General;  Laterality: N/A;   LITHOTRIPSY     for kidney stones 2002   POLYPECTOMY  08/19/2020   Procedure: POLYPECTOMY INTESTINAL;  Surgeon: Harvel Quale, MD;  [facility];  Service: Gastroenterology;;   POLYPECTOMY  08/19/2020   Procedure: POLYPECTOMY;  Surgeon: Harvel Quale, MD;  [facility];  Service: Gastroenterology;;   ROBOT ASSISTED LAPAROSCOPIC NEPHRECTOMY Left 10/06/2019   Procedure: XI ROBOTIC ASSISTED LAPAROSCOPIC NEPHRECTOMY WITH ADRENALECTOMY;  Surgeon: Ceasar Mons, MD;  [facility];  Service: Urology;  Laterality: Left;   SUBMUCOSAL TATTOO INJECTION  08/19/2020   Procedure: SUBMUCOSAL TATTOO INJECTION;  Surgeon: Harvel Quale, MD;  [facility];  Service: Gastroenterology;;   TEE WITHOUT CARDIOVERSION N/A 11/26/2014   Procedure: TRANSESOPHAGEAL ECHOCARDIOGRAM (TEE) WITH PROPOFOL;  Surgeon: Arnoldo Lenis, MD;  [facility];  Service: Endoscopy;  Laterality: N/A;   TEE WITHOUT CARDIOVERSION N/A 11/15/2015   Procedure: TRANSESOPHAGEAL ECHOCARDIOGRAM (TEE);  Surgeon: Jerline Pain, MD;  Location: [facility];  Service: Cardiovascular;  Laterality: N/A;   TEE WITHOUT CARDIOVERSION N/A 09/11/2018   Procedure: TRANSESOPHAGEAL ECHOCARDIOGRAM (TEE) WITH PROPOFOL;  Surgeon: Arnoldo Lenis, MD;  [facility];  Service: Endoscopy;  Laterality: N/A;   VASECTOMY      SOCIAL HISTORY:  Social History   Socioeconomic History   Marital status: Married    Spouse name: Not on  file   Number of children: Not on file   Years of education: Not on file   Highest education level: Not on file  Occupational History   Not on file  Tobacco Use   Smoking status: Never   Smokeless tobacco: Never  Vaping Use   Vaping Use: Never used  Substance and Sexual Activity   Alcohol use: No    Alcohol/week: 0.0 standard drinks   Drug use: No   Sexual activity: Yes    Birth control/protection: Surgical  Other Topics Concern   Not on file  Social History Narrative   Not on file   Social Determinants of Health   Financial Resource Strain: Low Risk    Difficulty of Paying Living Expenses: Not hard at all  Food Insecurity: No Food Insecurity   Worried About Charity fundraiser in the Last Year: Never true   [place] in the Last Year: Never true  Transportation Needs: No Transportation Needs   Lack of Transportation (Medical): No   Lack of Transportation (Non-Medical): No  Physical Activity: Inactive   Days of Exercise per Week: 0 days   Minutes of Exercise per  Session: 0 min  Stress: No Stress Concern Present   Feeling of Stress : Not at all  Social Connections: Moderately Integrated   Frequency of Communication with Friends and Family: More than three times a week   Frequency of Social Gatherings with Friends and Family: More than three times a week   Attends Religious Services: More than 4 times per year   Active Member of [employer] or Organizations: No   Attends Archivist Meetings: Never   Marital Status: Married  Human resources officer Violence: Not At Risk   Fear of Current or Ex-Partner: No   Emotionally Abused: No   Physically Abused: No   Sexually Abused: No    FAMILY HISTORY:  Family History  Problem Relation Age of Onset   Diabetes Mother    Hypertension Mother    Colon cancer Mother        dx. 97s   Diabetes Father    Colon cancer Father        dx. 78s   Lung cancer Maternal Grandmother        smoked   Bone cancer Maternal Grandfather         dx. 80s   Hypertension Other     CURRENT MEDICATIONS:  Current Outpatient Medications  Medication Sig Dispense Refill   amLODipine (NORVASC) 5 MG tablet TAKE 1 TABLET BY MOUTH EVERY DAY (Patient taking differently: Take 5 mg by mouth every evening.) 90 tablet 3   atorvastatin (LIPITOR) 20 MG tablet Take 20 mg by mouth every evening.      Bioflavonoid Products (ESTER C PO) Take 1,000 mg by mouth daily.     Calcium Citrate-Vitamin D (CITRACAL + D PO) Take 1 tablet by mouth daily.     capecitabine (XELODA) 500 MG tablet Take 4 tablets (2,000 mg total) by mouth 2 (two) times daily after a meal. Take for 14 days, then hold for 7 days. Repeat every 21 days. 112 tablet 3   cholecalciferol (VITAMIN D3) 25 MCG (1000 UNIT) tablet Take 1,000 Units by mouth daily.     iron polysaccharides (NIFEREX) 150 MG capsule Take 150 mg by mouth 2 (two) times daily.     losartan (COZAAR) 100 MG tablet Take 100 mg by mouth every evening.      metoprolol tartrate (LOPRESSOR) 100 MG tablet TAKE 1 TABLET BY MOUTH TWICE A DAY (Patient taking differently: Take 100 mg by mouth 2 (two) times daily.) 60 tablet 2   omeprazole (PRILOSEC) 40 MG capsule TAKE 1 CAPSULE BY MOUTH TWICE A DAY 180 capsule 1   OVER THE COUNTER MEDICATION Take 1 tablet by mouth 2 (two) times daily. Glucocil otc supplement     [START ON 10/27/2020] OXALIPLATIN IV Inject into the vein every 21 ( twenty-one) days.     prochlorperazine (COMPAZINE) 10 MG tablet Take 1 tablet (10 mg total) by mouth every 6 (six) hours as needed (Nausea or vomiting). 30 tablet 1   Semaglutide (OZEMPIC, 0.25 OR 0.5 MG/DOSE, [place]) Inject 0.5 mg into the skin every Monday.      Vitamin D, Ergocalciferol, (DRISDOL) 50000 UNITS CAPS capsule Take 50,000 Units by mouth every Wednesday.     zinc gluconate 50 MG tablet Take 50 mg by mouth daily.     No current facility-administered medications for this visit.    ALLERGIES:  Allergies  Allergen Reactions   Ace Inhibitors  Cough   Lisinopril Cough   Xarelto [Rivaroxaban] Other (See Comments)    Heart out of  rhythm    Quinine Derivatives Rash    PHYSICAL EXAM:  Performance status (ECOG): 1 - Symptomatic but completely ambulatory  There were no vitals filed for this visit. Wt Readings from Last 3 Encounters:  10/13/20 (!) 300 lb 0.7 oz (136.1 kg)  09/30/20 (!) 300 lb 6.4 oz (136.3 kg)  09/21/20 (!) 300 lb 6.4 oz (136.3 kg)   Physical Exam  LABORATORY DATA:  I have reviewed the labs as listed.  CBC Latest Ref Rng & Units 10/13/2020 10/02/2020 10/01/2020  WBC 4.0 - 10.5 K/uL 6.4 7.9 9.9  Hemoglobin 13.0 - 17.0 g/dL 10.3(L) 8.6(L) 9.3(L)  Hematocrit 39.0 - 52.0 % 34.2(L) 28.1(L) 29.5(L)  Platelets 150 - 400 K/uL 384 330 342   CMP Latest Ref Rng & Units 10/13/2020 10/02/2020 10/01/2020  Glucose 70 - 99 mg/dL 105(H) 114(H) 168(H)  BUN 8 - 23 mg/dL _0 Creatinine 0.61 - 1.24 mg/dL 1.20 1.25(H) 1.33(H)  Sodium 135 - 145 mmol/L 137 137 139  Potassium 3.5 - 5.1 mmol/L 4.8 4.5 4.5  Chloride 98 - 111 mmol/L 105 107 105  CO2 22 - 32 mmol/L 23 21(L) 23  Calcium 8.9 - 10.3 mg/dL 9.3 8.7(L) 9.3  Total Protein 6.5 - 8.1 g/dL 7.6 - -  Total Bilirubin 0.3 - 1.2 mg/dL 0.7 - -  Alkaline Phos 38 - 126 U/L 108 - -  AST 15 - 41 U/L 20 - -  ALT 0 - 44 U/L 28 - -    DIAGNOSTIC IMAGING:  I have independently reviewed the scans and discussed with the patient. No results found.   ASSESSMENT:  1.  Right-sided colon cancer: - He had 3 prior colonoscopies, last one 2 years ago by Dr. Wonda Cheng in [place], with multiple polyps removed. - Colonoscopy by Dr. Jenetta Downer on 08/19/2020 showed fungating infiltrative ulcerated partially obstructing large mass in the proximal ascending colon.  Mass was partially circumferential.  7 sessile polyps found in the transverse colon, ascending colon and cecum. - Pathology consistent with adenocarcinoma of the biopsy of the mass.  Single fragment of at least intramucosal adenocarcinoma in  the transverse colon polypectomy.  Pathology report has descending colon as adenocarcinoma. - CT CAP with contrast on 08/26/2020 shows circumferential lesion at the hepatic flexure of the colon consistent with primary colorectal carcinoma with no pathologically enlarged lymph node metastasis.  No liver metastasis.  Status post left nephrectomy.  No evidence of thoracic metastasis. - Even though descending colon polypectomy was reported as adenocarcinoma, Dr. Jenetta Downer felt that it was mislabeled.  Mass was reported in the proximal ascending colon on colonoscopy and close to hepatic flexure on CT scan. - Right hemicolectomy on 09/30/2020 - Pathology [id number], 2/30 lymph nodes positive, 1 tumor deposit, margins negative, MSI stable no lymphovascular or perineural invasion, grade 2, no perforation.  2.  Left kidney renal cell carcinoma: - Incidentally found on work-up for kidney stones. - Left nephrectomy by Dr. Gilford Rile on 10/06/2019 with pathology showing clear-cell renal cell carcinoma, grade 2, 6 cm, margins negative.  1 benign lymph node.  PT1BPN0.  3.  Social/family history: - He is a disabled.  He worked at [employer] and a [employer].  He might have exposed to some chemicals at the later job.  No prior history of smoking. - Family history significant for mother with colon cancer, father with colon cancer and maternal grandfather with bone cancer.   PLAN:  1.  Stage III (PT3PN1B) ascending colon adenocarcinoma,  MSI stable: - I have recommended 3 months of [place] ox which was noninferior to 6 months of FOLFOX in this setting. - We discussed regimen in detail. - He will start taking Xeloda 500 mg 4 tablets twice daily starting tonight. - We discussed side effects of Xeloda and oxaliplatin in detail. - He will proceed with PICC line placement and oxaliplatin day 1 today. - Reviewed his labs which showed normal LFTs with mildly elevated total bilirubin 1.3.  Creatinine  is 1.27 and stable.  CBC was grossly normal with mild normocytic anemia.  Last CEA was 1.1. - Recommend follow-up in 2 weeks for toxicity assessment.  2.  Personal and family history: -I have recommended genetic testing because of personal and family history. - We will follow-up on the results.  3.  Peripheral neuropathy: - He has mild grade 1 neuropathy in the fingertips and toes.  This is from underlying diabetes.  This is not affecting his ADLs and IADLs at this time. - We discussed the possibility of worsening of neuropathy.  However we will proceed with full dose oxaliplatin as dose intensity really helped in decreasing chance of recurrence in this protocol. - We will likely reduce the dose if he develops any worsening of neuropathy.   Orders placed this encounter:  No orders of the defined types were placed in this encounter.    Derek Jack, MD [facility] [phone]   I, Thana Ates, am acting as a scribe for Dr. Derek Jack.  I, Derek Jack MD, have reviewed the above documentation for accuracy and completeness, and I agree with the above.

## 2020-10-27 ENCOUNTER — Inpatient Hospital Stay (HOSPITAL_COMMUNITY): Payer: Medicare HMO

## 2020-10-27 ENCOUNTER — Ambulatory Visit
Admission: RE | Admit: 2020-10-27 | Discharge: 2020-10-27 | Disposition: A | Discharge status: Home / Self Care | Payer: Self-pay | Source: Ambulatory Visit | Attending: Hematology | Admitting: Hematology

## 2020-10-27 ENCOUNTER — Other Ambulatory Visit: Payer: Self-pay

## 2020-10-27 ENCOUNTER — Other Ambulatory Visit (HOSPITAL_COMMUNITY): Payer: Medicare HMO

## 2020-10-27 ENCOUNTER — Inpatient Hospital Stay (HOSPITAL_COMMUNITY): Payer: Medicare HMO | Admitting: Hematology

## 2020-10-27 VITALS — BP 135/82 | HR 56 | Temp 98.1°F | Resp 19 | Wt 299.8 lb

## 2020-10-27 DIAGNOSIS — K580 Irritable bowel syndrome with diarrhea: Secondary | ICD-10-CM | POA: Diagnosis not present

## 2020-10-27 DIAGNOSIS — R200 Anesthesia of skin: Secondary | ICD-10-CM | POA: Diagnosis not present

## 2020-10-27 DIAGNOSIS — I4891 Unspecified atrial fibrillation: Secondary | ICD-10-CM | POA: Diagnosis not present

## 2020-10-27 DIAGNOSIS — K589 Irritable bowel syndrome without diarrhea: Secondary | ICD-10-CM | POA: Diagnosis not present

## 2020-10-27 DIAGNOSIS — K219 Gastro-esophageal reflux disease without esophagitis: Secondary | ICD-10-CM | POA: Diagnosis not present

## 2020-10-27 DIAGNOSIS — I10 Essential (primary) hypertension: Secondary | ICD-10-CM | POA: Diagnosis not present

## 2020-10-27 DIAGNOSIS — E785 Hyperlipidemia, unspecified: Secondary | ICD-10-CM | POA: Diagnosis not present

## 2020-10-27 DIAGNOSIS — C642 Malignant neoplasm of left kidney, except renal pelvis: Secondary | ICD-10-CM | POA: Diagnosis not present

## 2020-10-27 DIAGNOSIS — C182 Malignant neoplasm of ascending colon: Secondary | ICD-10-CM | POA: Diagnosis not present

## 2020-10-27 DIAGNOSIS — E1142 Type 2 diabetes mellitus with diabetic polyneuropathy: Secondary | ICD-10-CM | POA: Diagnosis not present

## 2020-10-27 DIAGNOSIS — G629 Polyneuropathy, unspecified: Secondary | ICD-10-CM | POA: Diagnosis not present

## 2020-10-27 DIAGNOSIS — Z5111 Encounter for antineoplastic chemotherapy: Secondary | ICD-10-CM | POA: Diagnosis not present

## 2020-10-27 LAB — CBC WITH DIFFERENTIAL/PLATELET
Abs Immature Granulocytes: 0.02 10*3/uL (ref 0.00–0.07)
Basophils Absolute: 0.1 10*3/uL (ref 0.0–0.1)
Basophils Relative: 1 %
Eosinophils Absolute: 0.2 10*3/uL (ref 0.0–0.5)
Eosinophils Relative: 3 %
HCT: 37.3 % — ABNORMAL LOW (ref 39.0–52.0)
Hemoglobin: 11.2 g/dL — ABNORMAL LOW (ref 13.0–17.0)
Immature Granulocytes: 0 %
Lymphocytes Relative: 14 %
Lymphs Abs: 0.8 10*3/uL (ref 0.7–4.0)
MCH: 25.1 pg — ABNORMAL LOW (ref 26.0–34.0)
MCHC: 30 g/dL (ref 30.0–36.0)
MCV: 83.6 fL (ref 80.0–100.0)
Monocytes Absolute: 0.5 10*3/uL (ref 0.1–1.0)
Monocytes Relative: 10 %
Neutro Abs: 3.8 10*3/uL (ref 1.7–7.7)
Neutrophils Relative %: 72 %
Platelets: 327 10*3/uL (ref 150–400)
RBC: 4.46 MIL/uL (ref 4.22–5.81)
RDW: 15.6 % — ABNORMAL HIGH (ref 11.5–15.5)
WBC: 5.4 10*3/uL (ref 4.0–10.5)
nRBC: 0 % (ref 0.0–0.2)

## 2020-10-27 LAB — COMPREHENSIVE METABOLIC PANEL
ALT: 24 U/L (ref 0–44)
AST: 20 U/L (ref 15–41)
Albumin: 4.4 g/dL (ref 3.5–5.0)
Alkaline Phosphatase: 108 U/L (ref 38–126)
Anion gap: 6 (ref 5–15)
BUN: 20 mg/dL (ref 8–23)
CO2: 26 mmol/L (ref 22–32)
Calcium: 9 mg/dL (ref 8.9–10.3)
Chloride: 106 mmol/L (ref 98–111)
Creatinine, Ser: 1.27 mg/dL — ABNORMAL HIGH (ref 0.61–1.24)
GFR, Estimated: >60 mL/min (ref >60)
Glucose, Bld: 124 mg/dL — ABNORMAL HIGH (ref 70–99)
Potassium: 4.5 mmol/L (ref 3.5–5.1)
Sodium: 138 mmol/L (ref 135–145)
Total Bilirubin: 1.3 mg/dL — ABNORMAL HIGH (ref 0.3–1.2)
Total Protein: 7.5 g/dL (ref 6.5–8.1)

## 2020-10-27 LAB — MAGNESIUM: Magnesium: 1.9 mg/dL (ref 1.7–2.4)

## 2020-10-27 MED ORDER — DEXTROSE 5 % IV SOLN: Freq: Once | INTRAVENOUS | Status: AC

## 2020-10-27 MED ORDER — PALONOSETRON HCL INJECTION 0.25 MG/5ML
0.2500 mg | Freq: Once | INTRAVENOUS | Status: AC
  Administered 2020-10-27: 0.25 mg via INTRAVENOUS
  Filled 2020-10-27: qty 5

## 2020-10-27 MED ORDER — OXALIPLATIN CHEMO INJECTION 100 MG/20ML
130.0000 mg/m2 | Freq: Once | INTRAVENOUS | Status: AC
  Administered 2020-10-27: 360 mg via INTRAVENOUS
  Filled 2020-10-27: qty 72

## 2020-10-27 MED ORDER — SODIUM CHLORIDE 0.9% FLUSH
10.0000 mL | INTRAVENOUS | Status: DC | PRN
  Administered 2020-10-27: 10 mL

## 2020-10-27 MED ORDER — HEPARIN SOD (PORK) LOCK FLUSH 100 UNIT/ML IV SOLN
250.0000 [IU] | Freq: Once | INTRAVENOUS | Status: AC | PRN
  Administered 2020-10-27: 250 [IU]

## 2020-10-27 MED ORDER — SODIUM CHLORIDE 0.9 % IV SOLN
10.0000 mg | Freq: Once | INTRAVENOUS | Status: AC
  Administered 2020-10-27: 10 mg via INTRAVENOUS
  Filled 2020-10-27: qty 10

## 2020-10-27 NOTE — Progress Notes (Addendum)
Pt presents today for Oxaliplatin per provider's order. Vital signs and labs WNL for treatment today.Okay to proceed with treatment today per Dr.K.  Education provided to pt and pt's wife about how to flush PICC line with 10 ml normal saline and 2.5 ml of heparin twice weekly. Both pt and pt's wife verbalized understanding. Advised to call the clinic if needed.  Oxaliplatin  given today per MD orders. Tolerated infusion without adverse affects. Vital signs stable. No complaints at this time. Discharged from clinic ambulatory in stable condition. Alert and oriented x 3. F/U with [facility] as scheduled.

## 2020-10-27 NOTE — Progress Notes (Signed)
Patient has been examined, vital signs and labs have been reviewed by Dr. Katragadda. ANC, Creatinine, LFTs, hemoglobin, and platelets are within treatment parameters per Dr. Katragadda. Patient may proceed with treatment per M.D.   

## 2020-10-27 NOTE — Patient Instructions (Signed)
[place]  Discharge Instructions: Thank you for choosing [place] to provide your oncology and hematology care.  If you have a lab appointment with the [facility], please come in thru the Main Entrance and check in at the main information desk.  Wear comfortable clothing and clothing appropriate for easy access to any Portacath or PICC line.   We strive to give you quality time with your provider. You may need to reschedule your appointment if you arrive late (15 or more minutes).  Arriving late affects you and other patients whose appointments are after yours.  Also, if you miss three or more appointments without notifying the office, you may be dismissed from the clinic at the provider's discretion.      For prescription refill requests, have your pharmacy contact our office and allow 72 hours for refills to be completed.    Today you received the following chemotherapy and/or immunotherapy agents Oxaliplatin   To help prevent nausea and vomiting after your treatment, we encourage you to take your nausea medication as directed.  BELOW ARE SYMPTOMS THAT SHOULD BE REPORTED IMMEDIATELY: *FEVER GREATER THAN 100.4 F (38 C) OR HIGHER *CHILLS OR SWEATING *NAUSEA AND VOMITING THAT IS NOT CONTROLLED WITH YOUR NAUSEA MEDICATION *UNUSUAL SHORTNESS OF BREATH *UNUSUAL BRUISING OR BLEEDING *URINARY PROBLEMS (pain or burning when urinating, or frequent urination) *BOWEL PROBLEMS (unusual diarrhea, constipation, pain near the anus) TENDERNESS IN MOUTH AND THROAT WITH OR WITHOUT PRESENCE OF ULCERS (sore throat, sores in mouth, or a toothache) UNUSUAL RASH, SWELLING OR PAIN  UNUSUAL VAGINAL DISCHARGE OR ITCHING   Items with * indicate a potential emergency and should be followed up as soon as possible or go to the Emergency Department if any problems should occur.  Please show the CHEMOTHERAPY ALERT CARD or IMMUNOTHERAPY ALERT CARD at check-in to the Emergency  Department and triage nurse.  Should you have questions after your visit or need to cancel or reschedule your appointment, please contact [facility] [phone]  and follow the prompts.  Office hours are 8:00 a.m. to 4:30 p.m. Monday - Friday. Please note that voicemails left after 4:00 p.m. may not be returned until the following business day.  We are closed weekends and major holidays. You have access to a nurse at all times for urgent questions. Please call the main number to the clinic [phone] and follow the prompts.  For any non-urgent questions, you may also contact your provider using MyChart. We now offer e-Visits for anyone 95 and older to request care online for non-urgent symptoms. For details visit [email or web address].   Also download the MyChart app! Go to the app store, search "MyChart", open the app, select [facility], and log in with your MyChart username and password.  Due to Covid, a mask is required upon entering the hospital/clinic. If you do not have a mask, one will be given to you upon arrival. For doctor visits, patients may have 1 support person aged 80 or older with them. For treatment visits, patients cannot have anyone with them due to current Covid guidelines and our immunocompromised population.

## 2020-10-27 NOTE — Patient Instructions (Addendum)
[place] at [facility] Discharge Instructions  You were seen today by Dr. Delton Coombes. He went over your recent results, and you started your treatment. This treatment can cause cold sensitivity, so remember to avoid cold foods and liquids for the first week following this treatment. Dr. Delton Coombes will see you back in 2 weeks for labs and follow up.   Thank you for choosing [place] at [facility] to provide your oncology and hematology care.  To afford each patient quality time with our provider, please arrive at least 15 minutes before your scheduled appointment time.   If you have a lab appointment with the [facility] please come in thru the Main Entrance and check in at the main information desk  You need to re-schedule your appointment should you arrive 10 or more minutes late.  We strive to give you quality time with our providers, and arriving late affects you and other patients whose appointments are after yours.  Also, if you no show three or more times for appointments you may be dismissed from the clinic at the providers discretion.     Again, thank you for choosing [facility].  Our hope is that these requests will decrease the amount of time that you wait before being seen by our physicians.       _____________________________________________________________  Should you have questions after your visit to [facility], please contact our office at [phone] between the hours of 8:00 a.m. and 4:30 p.m.  Voicemails left after 4:00 p.m. will not be returned until the following business day.  For prescription refill requests, have your pharmacy contact our office and allow 72 hours.    Cancer Center Support Programs:   > Cancer Support Group  2nd Tuesday of the month 1pm-2pm, Journey Room

## 2020-10-28 LAB — CEA: CEA: 1.1 ng/mL (ref 0.0–4.7)

## 2020-10-28 NOTE — Progress Notes (Signed)
24 Hour call back. Patient denies any nausea, vomiting,or diarrhea. Patient instructed to call the clinic if any changes or concerns arise. Patient states, " I felt weird yesterday, but today feel pretty good." Patient states he has a weird sensation in his throat and his water tastes strange." Patient teaching performed. Reiterated the phone numbers to call after hours if patient needs to reach staff. Understanding verbalized.

## 2020-10-31 ENCOUNTER — Telehealth: Payer: Self-pay | Admitting: Genetic Counselor

## 2020-10-31 ENCOUNTER — Ambulatory Visit: Payer: Self-pay | Admitting: Genetic Counselor

## 2020-10-31 DIAGNOSIS — C182 Malignant neoplasm of ascending colon: Secondary | ICD-10-CM

## 2020-10-31 NOTE — Telephone Encounter (Signed)
I attempted to contact Bradley Rodriguez to discuss his genetic testing results (52 genes). I left a voicemail requesting he call me back at [phone].  Lucille Passy, MS, [facility] Genetic Counselor [email or web address] (P) [phone]

## 2020-10-31 NOTE — Progress Notes (Signed)
HPI:   Bradley Rodriguez was previously seen in the [place] clinic due to a personal and family history of cancer and concerns regarding a hereditary predisposition to cancer. Please refer to our prior cancer genetics clinic note for more information regarding our discussion, assessment and recommendations, at the time. Bradley Rodriguez recent genetic test results were disclosed to him, as were recommendations warranted by these results. These results and recommendations are discussed in more detail below.  CANCER HISTORY:  Oncology History  Cancer of ascending colon ([place])  09/01/2020 Initial Diagnosis   Cancer of ascending colon ([place])   10/27/2020 -  Chemotherapy   Patient is on Treatment Plan : COLORECTAL Xelox ([place]ox) q21d       FAMILY HISTORY:  We obtained a detailed, 4-generation family history.  Significant diagnoses are listed below:      Family History  Problem Relation Age of Onset   Colon cancer Mother          dx. 27s   Colon cancer Father          dx. 73s   Lung cancer Maternal Grandmother          smoked   Bone cancer Maternal Grandfather          dx. 52s    Bradley Rodriguez is unaware of previous family history of genetic testing for hereditary cancer risks. There is no reported Ashkenazi Jewish ancestry.      Bradley Rodriguez mother was diagnosed with colon cancer in her 17s, she is deceased. Her treatment only consisted of a colon resection. She did not have radiation or chemotherapy. His maternal grandmother was diagnosed with lung cancer, she smoked, she is deceased. His maternal grandfather was diagnosed with bone cancer in his 25s, he is deceased. Bradley Rodriguez father was diagnosed with colon cancer in his 14s, he is deceased. His treatment only consisted of a colon resection. He did not have radiation or chemotherapy.    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  Genetic testing also identified a  variant of uncertain significance (VUS) in the MET (Gain (Exon 2)) and RAD51D (c.922A>G) genes. At this time, it is unknown if the variants are associated with increased risk for cancer or if they are benign, but most uncertain variants are reclassified to benign. It should not be used to make medical management decisions. If the laboratory reclassifies the variants, we will contact Bradley Rodriguez to discuss it further.   The test report has been scanned into EPIC and is located under the Molecular Pathology section of the Results Review tab.  A portion of the result report is included below for reference. Genetic testing reported out on 10/27/2020.     The Common Hereditary Cancers + RNA Panel offered by [facility] includes sequencing, deletion/duplication, and RNA testing of the following genes: APC, ATM, AXIN2, BARD1, BMPR1A, BRCA1, BRCA2, BRIP1, CDH1, CDK4*, CDKN2A (p14ARF)*, CDKN2A (p16INK4a)*, CHEK2, CTNNA1, DICER1, EPCAM (Deletion/duplication testing only), GREM1 (promoter region deletion/duplication testing only), KIT, MEN1, MLH1, MSH2, MSH3, MSH6, MUTYH, NBN, NF1, NHTL1, PALB2, PDGFRA*, PMS2, POLD1, POLE, PTEN, RAD50, RAD51C, RAD51D, SDHB, SDHC, SDHD, SMAD4, SMARCA4. STK11, TP53, TSC1, TSC2, and VHL.  The following genes were evaluated for sequence changes only: SDHA and HOXB13 c.251G>A variant only.  RNA analysis is not performed for the * genes. The Panel also included BAP1, FH, FLCN, MET, and MITF (52 genes total).   Cancer Risks for RAD51C: Male breast  cancer, 20-40% risk Ovarian cancer, 10-15% Currently, there is no known increase in cancer risk for males with a RAD51C mutation.  Management Recommendations:  Breast Cancer Screening/Risk Reduction:  Women: Breast cancer screening includes: Breast awareness beginning at age 95 Monthly self-breast examination beginning at age 75 Clinical breast examination every 6-12 months beginning at age 55 or at the age of the earliest diagnosed breast  cancer in the family, if onset was before age 66 Annual mammogram with consideration of tomosynthesis starting at age 18 or 38 years prior to the youngest age of diagnosis, whichever comes first Consider breast MRI with contrast starting at age 43 Evidence is insufficient for a prophylactic risk-reducing mastectomy, manage based on family history   Ovarian Cancer Screening/Risk Reduction: Consider having a risk-reducing salpingo oophorectomy (RRSO), removal of the ovaries and fallopian tubes, between the ages of 24-50 or earlier based on a specific family history of an earlier onset ovarian cancer. Having a RRSO is estimated to reduce the risk of ovarian cancer by up to 96%. There is still a small risk of developing an "ovarian-like" cancer in the lining of the abdomen, called the peritoneum.  Additional Considerations and Screening Recommendations: Due to Bradley Rodriguez personal history of colon cancer, his siblings and children are all recommended to begin colonoscopies at age 68 and repeat every 5 years. More frequent colonoscopies may be recommended if polyps are identified.  Individuals with a single pathogenic variant in RAD51C are also carriers of autosomal recessive Fanconi anemia. Fanconi anemia is characterized by bone marrow failure with variable additional anomalies that often include short stature, abnormal skin pigmentation, abnormal thumbs, malformations of the skeletal and central nervous systems, and developmental delay. Risks for leukemia and early-onset solid tumors are significantly elevated with this disorder. For there to be a risk of Fanconi anemia in offspring, both parents would each have to have a pathogenic variant in RAD51C; in this case, the risk of having an affected child is 25%.  This information is based on current understanding of the gene and may change in the future.  Implications for Family Members: Hereditary predisposition to cancer due to pathogenic variants in  the RAD51C gene has autosomal dominant inheritance. This means that an individual with a pathogenic variant has a 50% chance of passing the condition on to his/her offspring. Identification of a pathogenic variant allows for the recognition of at-risk relatives who can pursue testing for the familial variant.  Family members are encouraged to consider genetic testing for this familial pathogenic variant. As there are generally no childhood cancer risks associated with pathogenic variants in the RAD51C gene, individuals in the family are not recommended to have testing until they reach at least 62 years of age. They may contact our office at [phone] for more information or to schedule an appointment.  Complimentary testing for the familial variant is available for 90 days from his report date.  Family members who live outside of the area are encouraged to find a genetic counselor in their area by visiting: [email or web address].  SUPPORT AND RESOURCES:  If Bradley Rodriguez is interested in information and support, there are two groups, Facing Our Risk (www.facingourrisk.com) and Bright Pink (www.brightpink.org) which some people have found useful when identified with a hereditary risk for breast and ovarian cancer. They provide opportunities to speak with other individuals from high-risk families. To locate genetic counselors in other cities, visit the website of the [employer] (ArtistMovie.se) and Secretary/administrator for a Social worker by zip  code.  We encouraged Bradley Rodriguez to remain in contact with [name] on an annual basis so we can update his personal and family histories, and let him know of advances in cancer genetics that may benefit the family. Our contact number was provided. Bradley Rodriguez questions were answered to his satisfaction today, and he knows he is welcome to call anytime with additional questions.   Lucille Passy, MS, [facility] Genetic  Counselor [email or web address] (P) [phone]

## 2020-11-04 ENCOUNTER — Telehealth: Payer: Self-pay | Admitting: Genetic Counselor

## 2020-11-04 ENCOUNTER — Other Ambulatory Visit: Payer: Self-pay

## 2020-11-04 ENCOUNTER — Inpatient Hospital Stay (HOSPITAL_COMMUNITY): Payer: Medicare HMO

## 2020-11-04 VITALS — BP 149/83 | HR 66 | Temp 97.9°F | Resp 18

## 2020-11-04 DIAGNOSIS — C182 Malignant neoplasm of ascending colon: Secondary | ICD-10-CM

## 2020-11-04 DIAGNOSIS — K589 Irritable bowel syndrome without diarrhea: Secondary | ICD-10-CM | POA: Diagnosis not present

## 2020-11-04 DIAGNOSIS — K219 Gastro-esophageal reflux disease without esophagitis: Secondary | ICD-10-CM | POA: Diagnosis not present

## 2020-11-04 DIAGNOSIS — Z5111 Encounter for antineoplastic chemotherapy: Secondary | ICD-10-CM | POA: Diagnosis not present

## 2020-11-04 DIAGNOSIS — C642 Malignant neoplasm of left kidney, except renal pelvis: Secondary | ICD-10-CM | POA: Diagnosis not present

## 2020-11-04 DIAGNOSIS — I10 Essential (primary) hypertension: Secondary | ICD-10-CM | POA: Diagnosis not present

## 2020-11-04 DIAGNOSIS — R200 Anesthesia of skin: Secondary | ICD-10-CM | POA: Diagnosis not present

## 2020-11-04 DIAGNOSIS — I4891 Unspecified atrial fibrillation: Secondary | ICD-10-CM | POA: Diagnosis not present

## 2020-11-04 DIAGNOSIS — E785 Hyperlipidemia, unspecified: Secondary | ICD-10-CM | POA: Diagnosis not present

## 2020-11-04 MED ORDER — SODIUM CHLORIDE 0.9% FLUSH
10.0000 mL | Freq: Once | INTRAVENOUS | Status: AC
  Administered 2020-11-04: 10 mL via INTRAVENOUS

## 2020-11-04 MED ORDER — HEPARIN SOD (PORK) LOCK FLUSH 100 UNIT/ML IV SOLN
250.0000 [IU] | Freq: Once | INTRAVENOUS | Status: AC
  Administered 2020-11-04: 250 [IU] via INTRAVENOUS

## 2020-11-04 NOTE — Progress Notes (Signed)
Patient presents today for PICC line dressing change.  Patient is in satisfactory condition with no complaints voiced.  Vital signs are stable.    Patient tolerated PICC line dressing change well.  Antimicrobial disc is in place.  Single lumen PICC line flushed with 10 mL of NS and 2.5 mL of heparin.  Good blood return noted.  Dressing clean, dry, and intact.  Patient left ambulatory in stable condition.

## 2020-11-04 NOTE — Patient Instructions (Signed)
[facility]  Discharge Instructions: Thank you for choosing [facility] to provide your oncology and hematology care.  If you have a lab appointment with the Cancer Center, please come in thru the Main Entrance and check in at the main information desk.  Wear comfortable clothing and clothing appropriate for easy access to any Portacath or PICC line.   We strive to give you quality time with your provider. You may need to reschedule your appointment if you arrive late (15 or more minutes).  Arriving late affects you and other patients whose appointments are after yours.  Also, if you miss three or more appointments without notifying the office, you may be dismissed from the clinic at the provider's discretion.      For prescription refill requests, have your pharmacy contact our office and allow 72 hours for refills to be completed.        To help prevent nausea and vomiting after your treatment, we encourage you to take your nausea medication as directed.  BELOW ARE SYMPTOMS THAT SHOULD BE REPORTED IMMEDIATELY: *FEVER GREATER THAN 100.4 F (38 C) OR HIGHER *CHILLS OR SWEATING *NAUSEA AND VOMITING THAT IS NOT CONTROLLED WITH YOUR NAUSEA MEDICATION *UNUSUAL SHORTNESS OF BREATH *UNUSUAL BRUISING OR BLEEDING *URINARY PROBLEMS (pain or burning when urinating, or frequent urination) *BOWEL PROBLEMS (unusual diarrhea, constipation, pain near the anus) TENDERNESS IN MOUTH AND THROAT WITH OR WITHOUT PRESENCE OF ULCERS (sore throat, sores in mouth, or a toothache) UNUSUAL RASH, SWELLING OR PAIN  UNUSUAL VAGINAL DISCHARGE OR ITCHING   Items with * indicate a potential emergency and should be followed up as soon as possible or go to the Emergency Department if any problems should occur.  Please show the CHEMOTHERAPY ALERT CARD or IMMUNOTHERAPY ALERT CARD at check-in to the Emergency Department and triage nurse.  Should you have questions after your visit or need to cancel  or reschedule your appointment, please contact [facility] [phone]  and follow the prompts.  Office hours are 8:00 a.m. to 4:30 p.m. Monday - Friday. Please note that voicemails left after 4:00 p.m. may not be returned until the following business day.  We are closed weekends and major holidays. You have access to a nurse at all times for urgent questions. Please call the main number to the clinic [phone] and follow the prompts.  For any non-urgent questions, you may also contact your provider using MyChart. We now offer e-Visits for anyone 18 and older to request care online for non-urgent symptoms. For details visit [email or web address].   Also download the MyChart app! Go to the app store, search "MyChart", open the app, select , and log in with your MyChart username and password.  Due to Covid, a mask is required upon entering the hospital/clinic. If you do not have a mask, one will be given to you upon arrival. For doctor visits, patients may have 1 support person aged 18 or older with them. For treatment visits, patients cannot have anyone with them due to current Covid guidelines and our immunocompromised population.  

## 2020-11-04 NOTE — Telephone Encounter (Signed)
I attempted to contact Mr. Sanderfer to discuss his genetic testing results (84 genes). I left a voicemail requesting he call me back at [phone].  Lucille Passy, MS, [facility] Genetic Counselor [email or web address] (P) [phone]

## 2020-11-07 ENCOUNTER — Telehealth: Payer: Self-pay | Admitting: Genetic Counselor

## 2020-11-07 ENCOUNTER — Encounter: Payer: Self-pay | Admitting: Genetic Counselor

## 2020-11-07 NOTE — Telephone Encounter (Signed)
I attempted to contact Mr. Kornegay to discuss his genetic testing results (52 genes). I left a voicemail requesting he call me back at [phone].  Lucille Passy, MS, [facility] Genetic Counselor [email or web address] (P) [phone]

## 2020-11-08 ENCOUNTER — Encounter (HOSPITAL_COMMUNITY): Payer: Self-pay | Admitting: General Practice

## 2020-11-08 NOTE — Progress Notes (Signed)
[facility] CSW Progress Notes  Call to patient to check in with him and offer [place] resources.  Unable to reach, left a generic VM as mailbox was not personally identified.  Encouraged him to call back at his convenience.  Edwyna Shell, LCSW Clinical Social Worker Phone:  [phone]

## 2020-11-09 ENCOUNTER — Telehealth: Payer: Self-pay | Admitting: Genetic Counselor

## 2020-11-09 NOTE — Progress Notes (Signed)
Bradley Rodriguez, Bradley Rodriguez [id number]   CLINIC:  Medical Oncology/Hematology  PCP:  Caryl Bis, MD [address] [place] [address] [phone]   REASON FOR VISIT:  Follow-up for colon cancer  PRIOR THERAPY: Left nephrectomy by Dr. Gilford Rile on 10/06/2019   NGS Results: not done  CURRENT THERAPY: Colorectal Xelox every 3 weeks  BRIEF ONCOLOGIC HISTORY:  Oncology History  Cancer of ascending colon ([place])  09/01/2020 Initial Diagnosis   Cancer of ascending colon ([place])   10/27/2020 -  Chemotherapy   Patient is on Treatment Plan : COLORECTAL Xelox ([place]) q21d       CANCER STAGING: Cancer Staging Cancer of ascending colon ([place]) Staging form: Colon and Rectum, AJCC 8th Edition - Clinical stage from 09/01/2020: Stage IIIB (cT3, cN1b, cM0) - Unsigned   INTERVAL HISTORY:  Bradley Rodriguez, a 60 y.o. male, returns for routine follow-up of his colon cancer. Bradley Rodriguez was last seen on 10/27/2020.   Today he reports feeling good. Following treatment he reports one episode of diarrhea which was resolved with imodium. He reports mild aching pain in his toes, especially in his big toes bilaterally, at night as well as cold sensation in his fingers. He denies blistering on his hands and feet, fatigue, nausea, and mouth sores.   REVIEW OF SYSTEMS:  Review of Systems  Constitutional:  Negative for appetite change and fatigue.  HENT:   Negative for mouth sores.   Gastrointestinal:  Positive for diarrhea (x1 resolved). Negative for nausea and vomiting.  Neurological:  Positive for numbness (toes).  All other systems reviewed and are negative.  PAST MEDICAL/SURGICAL HISTORY:  Past Medical History:  Diagnosis Date   A-fib [facility])    DCCV 2009, 2012, 2014, Nov 2016   Anxiety    Arthritis    Knees, wrist, ankles   Cancer ([place]) 07/2019   Kidney   Diabetes mellitus without complication ([place])    Dysrhythmia 1996   A- Fib   GERD (gastroesophageal reflux  disease)    History of kidney stones    HX: anticoagulation    very remotely and briefly on COumadin aound one of his CV, 2014 on Eliquis had hematuria with renal calculi and stopped   Hyperlipidemia    patient denies this   Hypertension    IBS (irritable bowel syndrome)    Normal cardiac stress test    Normal nuclear stress test , 2001   Past Surgical History:  Procedure Laterality Date   BIOPSY  08/19/2020   Procedure: BIOPSY;  Surgeon: Harvel Quale, MD;  [facility];  Service: Gastroenterology;;   CARDIOVERSION N/A 10/15/2012   Procedure: CARDIOVERSION;  Surgeon: Larey Dresser, MD;  Location: [facility] ENDOSCOPY;  Service: Cardiovascular;  Laterality: N/A;   CARDIOVERSION N/A 11/26/2014   Procedure: CARDIOVERSION;  Surgeon: Arnoldo Lenis, MD;  Location: [facility] ORS;  Service: Endoscopy;  Laterality: N/A;   CARDIOVERSION N/A 11/15/2015   Procedure: CARDIOVERSION;  Surgeon: Jerline Pain, MD;  Location: [facility];  Service: Cardiovascular;  Laterality: N/A;   CARDIOVERSION N/A 08/07/2016   Procedure: CARDIOVERSION;  Surgeon: Herminio Commons, MD;  Location: [facility];  Service: Cardiovascular;  Laterality: N/A;   CARDIOVERSION N/A 09/11/2018   Procedure: CARDIOVERSION;  Surgeon: Arnoldo Lenis, MD;  Location: [facility];  Service: Endoscopy;  Laterality: N/A;   CATARACT EXTRACTION W/PHACO Right 01/18/2014   Procedure: CATARACT EXTRACTION PHACO AND INTRAOCULAR LENS PLACEMENT (IOC);  Surgeon:  Tonny Branch, MD;  Location: [facility];  Service: Ophthalmology;  Laterality: Right;  CDE 9.95   COLONOSCOPY WITH PROPOFOL N/A 08/19/2020   Procedure: COLONOSCOPY WITH PROPOFOL;  Surgeon: Harvel Quale, MD;  [facility];  Service: Gastroenterology;  Laterality: N/A;  8:20   CYSTOSCOPY WITH LITHOLAPAXY N/A 10/06/2019   Procedure: CYSTOSCOPY WITH LASER LITHOLAPAXY;  Surgeon: Ceasar Mons, MD;  [facility];  Service: Urology;  Laterality: N/A;    ESOPHAGOGASTRODUODENOSCOPY (EGD) WITH PROPOFOL N/A 08/19/2020   Procedure: ESOPHAGOGASTRODUODENOSCOPY (EGD) WITH PROPOFOL;  Surgeon: Harvel Quale, MD;  [facility];  Service: Gastroenterology;  Laterality: N/A;   EYE SURGERY Right    detached retina   FOOT SURGERY Left    KNEE ARTHROSCOPY Right    LAPAROSCOPIC RIGHT HEMI COLECTOMY N/A 09/30/2020   Procedure: LAPAROSCOPIC RIGHT HEMI COLECTOMY;  Surgeon: Ileana Roup, MD;  [facility];  Service: General;  Laterality: N/A;   LITHOTRIPSY     for kidney stones 2002   POLYPECTOMY  08/19/2020   Procedure: POLYPECTOMY INTESTINAL;  Surgeon: Harvel Quale, MD;  [facility];  Service: Gastroenterology;;   POLYPECTOMY  08/19/2020   Procedure: POLYPECTOMY;  Surgeon: Harvel Quale, MD;  [facility];  Service: Gastroenterology;;   ROBOT ASSISTED LAPAROSCOPIC NEPHRECTOMY Left 10/06/2019   Procedure: XI ROBOTIC ASSISTED LAPAROSCOPIC NEPHRECTOMY WITH ADRENALECTOMY;  Surgeon: Ceasar Mons, MD;  [facility];  Service: Urology;  Laterality: Left;   SUBMUCOSAL TATTOO INJECTION  08/19/2020   Procedure: SUBMUCOSAL TATTOO INJECTION;  Surgeon: Harvel Quale, MD;  [facility];  Service: Gastroenterology;;   TEE WITHOUT CARDIOVERSION N/A 11/26/2014   Procedure: TRANSESOPHAGEAL ECHOCARDIOGRAM (TEE) WITH PROPOFOL;  Surgeon: Arnoldo Lenis, MD;  [facility];  Service: Endoscopy;  Laterality: N/A;   TEE WITHOUT CARDIOVERSION N/A 11/15/2015   Procedure: TRANSESOPHAGEAL ECHOCARDIOGRAM (TEE);  Surgeon: Jerline Pain, MD;  Location: [facility];  Service: Cardiovascular;  Laterality: N/A;   TEE WITHOUT CARDIOVERSION N/A 09/11/2018   Procedure: TRANSESOPHAGEAL ECHOCARDIOGRAM (TEE) WITH PROPOFOL;  Surgeon: Arnoldo Lenis, MD;  [facility];  Service: Endoscopy;  Laterality: N/A;   VASECTOMY      SOCIAL HISTORY:  Social History    Socioeconomic History   Marital status: Married    Spouse name: Not on file   Number of children: Not on file   Years of education: Not on file   Highest education level: Not on file  Occupational History   Not on file  Tobacco Use   Smoking status: Never   Smokeless tobacco: Never  Vaping Use   Vaping Use: Never used  Substance and Sexual Activity   Alcohol use: No    Alcohol/week: 0.0 standard drinks   Drug use: No   Sexual activity: Yes    Birth control/protection: Surgical  Other Topics Concern   Not on file  Social History Narrative   Not on file   Social Determinants of Health   Financial Resource Strain: Low Risk    Difficulty of Paying Living Expenses: Not hard at all  Food Insecurity: No Food Insecurity   Worried About Charity fundraiser in the Last Year: Never true   [place] in the Last Year: Never true  Transportation Needs: No Transportation Needs   Lack of Transportation (Medical): No   Lack of Transportation (Non-Medical): No  Physical Activity: Inactive   Days of Exercise per Week: 0 days  Minutes of Exercise per Session: 0 min  Stress: No Stress Concern Present   Feeling of Stress : Not at all  Social Connections: Moderately Integrated   Frequency of Communication with Friends and Family: More than three times a week   Frequency of Social Gatherings with Friends and Family: More than three times a week   Attends Religious Services: More than 4 times per year   Active Member of [employer] or Organizations: No   Attends Archivist Meetings: Never   Marital Status: Married  Human resources officer Violence: Not At Risk   Fear of Current or Ex-Partner: No   Emotionally Abused: No   Physically Abused: No   Sexually Abused: No    FAMILY HISTORY:  Family History  Problem Relation Age of Onset   Diabetes Mother    Hypertension Mother    Colon cancer Mother        dx. 36s   Diabetes Father    Colon cancer Father        dx. 68s   Lung  cancer Maternal Grandmother        smoked   Bone cancer Maternal Grandfather        dx. 80s   Hypertension Other     CURRENT MEDICATIONS:  Current Outpatient Medications  Medication Sig Dispense Refill   amLODipine (NORVASC) 5 MG tablet TAKE 1 TABLET BY MOUTH EVERY DAY (Patient taking differently: Take 5 mg by mouth every evening.) 90 tablet 3   atorvastatin (LIPITOR) 20 MG tablet Take 20 mg by mouth every evening.      Bioflavonoid Products (ESTER C PO) Take 1,000 mg by mouth daily.     Calcium Citrate-Vitamin D (CITRACAL + D PO) Take 1 tablet by mouth daily.     capecitabine (XELODA) 500 MG tablet Take 4 tablets (2,000 mg total) by mouth 2 (two) times daily after a meal. Take for 14 days, then hold for 7 days. Repeat every 21 days. 112 tablet 3   cholecalciferol (VITAMIN D3) 25 MCG (1000 UNIT) tablet Take 1,000 Units by mouth daily.     iron polysaccharides (NIFEREX) 150 MG capsule Take 150 mg by mouth 2 (two) times daily.     losartan (COZAAR) 100 MG tablet Take 100 mg by mouth every evening.      metoprolol tartrate (LOPRESSOR) 100 MG tablet TAKE 1 TABLET BY MOUTH TWICE A DAY (Patient taking differently: Take 100 mg by mouth 2 (two) times daily.) 60 tablet 2   omeprazole (PRILOSEC) 40 MG capsule TAKE 1 CAPSULE BY MOUTH TWICE A DAY 180 capsule 1   OVER THE COUNTER MEDICATION Take 1 tablet by mouth 2 (two) times daily. Glucocil otc supplement     OXALIPLATIN IV Inject into the vein every 21 ( twenty-one) days.     prochlorperazine (COMPAZINE) 10 MG tablet Take 1 tablet (10 mg total) by mouth every 6 (six) hours as needed (Nausea or vomiting). 30 tablet 1   Semaglutide (OZEMPIC, 0.25 OR 0.5 MG/DOSE, [place]) Inject 0.5 mg into the skin every Monday.      Vitamin D, Ergocalciferol, (DRISDOL) 50000 UNITS CAPS capsule Take 50,000 Units by mouth every Wednesday.     zinc gluconate 50 MG tablet Take 50 mg by mouth daily.     No current facility-administered medications for this visit.     ALLERGIES:  Allergies  Allergen Reactions   Ace Inhibitors Cough   Lisinopril Cough   Xarelto [Rivaroxaban] Other (See Comments)    Heart out  of rhythm    Quinine Derivatives Rash    PHYSICAL EXAM:  Performance status (ECOG): 1 - Symptomatic but completely ambulatory  There were no vitals filed for this visit. Wt Readings from Last 3 Encounters:  10/27/20 299 lb 13.2 oz (136 kg)  10/13/20 (!) 300 lb 0.7 oz (136.1 kg)  09/30/20 (!) 300 lb 6.4 oz (136.3 kg)   Physical Exam Vitals reviewed.  Constitutional:      Appearance: Normal appearance.  Cardiovascular:     Rate and Rhythm: Normal rate and regular rhythm.     Pulses: Normal pulses.     Heart sounds: Normal heart sounds.  Pulmonary:     Effort: Pulmonary effort is normal.     Breath sounds: Normal breath sounds.  Neurological:     General: No focal deficit present.     Mental Status: He is alert and oriented to person, place, and time.  Psychiatric:        Mood and Affect: Mood normal.        Behavior: Behavior normal.     LABORATORY DATA:  I have reviewed the labs as listed.  CBC Latest Ref Rng & Units 10/27/2020 10/13/2020 10/02/2020  WBC 4.0 - 10.5 K/uL 5.4 6.4 7.9  Hemoglobin 13.0 - 17.0 g/dL 11.2(L) 10.3(L) 8.6(L)  Hematocrit 39.0 - 52.0 % 37.3(L) 34.2(L) 28.1(L)  Platelets 150 - 400 K/uL 327 384 330   CMP Latest Ref Rng & Units 10/27/2020 10/13/2020 10/02/2020  Glucose 70 - 99 mg/dL 124(H) 105(H) 114(H)  BUN 8 - 23 mg/dL 20 15 22   Creatinine 0.61 - 1.24 mg/dL 1.27(H) 1.20 1.25(H)  Sodium 135 - 145 mmol/L 138 137 137  Potassium 3.5 - 5.1 mmol/L 4.5 4.8 4.5  Chloride 98 - 111 mmol/L 106 105 107  CO2 22 - 32 mmol/L 26 23 21(L)  Calcium 8.9 - 10.3 mg/dL 9.0 9.3 8.7(L)  Total Protein 6.5 - 8.1 g/dL 7.5 7.6 -  Total Bilirubin 0.3 - 1.2 mg/dL 1.3(H) 0.7 -  Alkaline Phos 38 - 126 U/L 108 108 -  AST 15 - 41 U/L 20 20 -  ALT 0 - 44 U/L 24 28 -    DIAGNOSTIC IMAGING:  I have independently reviewed the scans  and discussed with the patient. [name] EKG SITE RITE  Result Date: 10/27/2020 If Site Rite image not attached, placement could not be confirmed due to current cardiac rhythm.    ASSESSMENT:  1.  Right-sided colon cancer: - He had 3 prior colonoscopies, last one 2 years ago by Dr. Wonda Cheng in [place], with multiple polyps removed. - Colonoscopy by Dr. Jenetta Downer on 08/19/2020 showed fungating infiltrative ulcerated partially obstructing large mass in the proximal ascending colon.  Mass was partially circumferential.  7 sessile polyps found in the transverse colon, ascending colon and cecum. - Pathology consistent with adenocarcinoma of the biopsy of the mass.  Single fragment of at least intramucosal adenocarcinoma in the transverse colon polypectomy.  Pathology report has descending colon as adenocarcinoma. - CT CAP with contrast on 08/26/2020 shows circumferential lesion at the hepatic flexure of the colon consistent with primary colorectal carcinoma with no pathologically enlarged lymph node metastasis.  No liver metastasis.  Status post left nephrectomy.  No evidence of thoracic metastasis. - Even though descending colon polypectomy was reported as adenocarcinoma, Dr. Jenetta Downer felt that it was mislabeled.  Mass was reported in the proximal ascending colon on colonoscopy and close to hepatic flexure on CT scan. - Right hemicolectomy on 09/30/2020 -  Pathology PT3PN1B, 2/30 lymph nodes positive, 1 tumor deposit, margins negative, MSI stable no lymphovascular or perineural invasion, grade 2, no perforation. - Cycle 1 of [place] ox on 10/27/2020.  2.  Left kidney renal cell carcinoma: - Incidentally found on work-up for kidney stones. - Left nephrectomy by Dr. Gilford Rile on 10/06/2019 with pathology showing clear-cell renal cell carcinoma, grade 2, 6 cm, margins negative.  1 benign lymph node.  PT1BPN0.  3.  Social/family history: - He is a disabled.  He worked at [employer] and a [employer].   He might have exposed to some chemicals at the later job.  No prior history of smoking. - Family history significant for mother with colon cancer, father with colon cancer and maternal grandfather with bone cancer.   PLAN:  1.  Stage III (PT3PN1B) ascending colon adenocarcinoma, MSI stable: - He received cycle 1 of Cape ox 2 weeks ago. - He had 1 episode of diarrhea which improved with half tablet of Imodium. - He did not experience any major tiredness.  No nausea or vomiting. - Reviewed labs today which showed elevated total bilirubin which could be from Xeloda.  He stopped Xeloda last night. - CBC shows anemia with hemoglobin 10.4, likely from myelosuppression.  Other LFTs are normal. - RTC 1 week for follow-up to initiate cycle 2.  2.  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 - We discussed implications including increased risk of ovarian and breast cancer in females.  He was told to have his family members tested.  3.  Peripheral neuropathy: - He has mild grade 1 neuropathy in the fingertips and toes from underlying diabetes which is not affecting his ADLs and IADLs. - His neuropathy has not gotten any worse since first cycle.  He reported some achy pain in the great toes which lasted about 2 minutes.   Orders placed this encounter:  No orders of the defined types were placed in this encounter.    Derek Jack, MD [place] [phone]   I, Thana Ates, am acting as a scribe for Dr. Derek Jack.  I, Derek Jack MD, have reviewed the above documentation for accuracy and completeness, and I agree with the above.

## 2020-11-09 NOTE — Telephone Encounter (Signed)
I attempted to contact Bradley Rodriguez to discuss his genetic testing results (52 genes). I left a voicemail requesting he call me back at [phone].  Lucille Passy, MS, [facility] Genetic Counselor [email or web address] (P) [phone]

## 2020-11-10 ENCOUNTER — Encounter: Payer: Self-pay | Admitting: Genetic Counselor

## 2020-11-10 ENCOUNTER — Other Ambulatory Visit (HOSPITAL_COMMUNITY): Payer: Self-pay

## 2020-11-10 ENCOUNTER — Inpatient Hospital Stay (HOSPITAL_BASED_OUTPATIENT_CLINIC_OR_DEPARTMENT_OTHER): Payer: Medicare HMO | Admitting: Hematology

## 2020-11-10 ENCOUNTER — Inpatient Hospital Stay (HOSPITAL_COMMUNITY): Payer: Medicare HMO

## 2020-11-10 ENCOUNTER — Other Ambulatory Visit: Payer: Self-pay

## 2020-11-10 VITALS — BP 150/83 | HR 53 | Temp 97.0°F | Resp 20 | Wt 298.0 lb

## 2020-11-10 DIAGNOSIS — R200 Anesthesia of skin: Secondary | ICD-10-CM | POA: Diagnosis not present

## 2020-11-10 DIAGNOSIS — Z5111 Encounter for antineoplastic chemotherapy: Secondary | ICD-10-CM | POA: Diagnosis not present

## 2020-11-10 DIAGNOSIS — C642 Malignant neoplasm of left kidney, except renal pelvis: Secondary | ICD-10-CM | POA: Diagnosis not present

## 2020-11-10 DIAGNOSIS — I10 Essential (primary) hypertension: Secondary | ICD-10-CM | POA: Diagnosis not present

## 2020-11-10 DIAGNOSIS — I4891 Unspecified atrial fibrillation: Secondary | ICD-10-CM | POA: Diagnosis not present

## 2020-11-10 DIAGNOSIS — C182 Malignant neoplasm of ascending colon: Secondary | ICD-10-CM

## 2020-11-10 DIAGNOSIS — K589 Irritable bowel syndrome without diarrhea: Secondary | ICD-10-CM | POA: Diagnosis not present

## 2020-11-10 DIAGNOSIS — Z1589 Genetic susceptibility to other disease: Secondary | ICD-10-CM | POA: Insufficient documentation

## 2020-11-10 DIAGNOSIS — Z1379 Encounter for other screening for genetic and chromosomal anomalies: Secondary | ICD-10-CM | POA: Insufficient documentation

## 2020-11-10 DIAGNOSIS — K219 Gastro-esophageal reflux disease without esophagitis: Secondary | ICD-10-CM | POA: Diagnosis not present

## 2020-11-10 DIAGNOSIS — E785 Hyperlipidemia, unspecified: Secondary | ICD-10-CM | POA: Diagnosis not present

## 2020-11-10 LAB — CBC WITH DIFFERENTIAL/PLATELET
Abs Immature Granulocytes: 0.03 10*3/uL (ref 0.00–0.07)
Basophils Absolute: 0 10*3/uL (ref 0.0–0.1)
Basophils Relative: 1 %
Eosinophils Absolute: 0.1 10*3/uL (ref 0.0–0.5)
Eosinophils Relative: 3 %
HCT: 33.1 % — ABNORMAL LOW (ref 39.0–52.0)
Hemoglobin: 10.4 g/dL — ABNORMAL LOW (ref 13.0–17.0)
Immature Granulocytes: 1 %
Lymphocytes Relative: 15 %
Lymphs Abs: 0.7 10*3/uL (ref 0.7–4.0)
MCH: 25.6 pg — ABNORMAL LOW (ref 26.0–34.0)
MCHC: 31.4 g/dL (ref 30.0–36.0)
MCV: 81.3 fL (ref 80.0–100.0)
Monocytes Absolute: 0.6 10*3/uL (ref 0.1–1.0)
Monocytes Relative: 12 %
Neutro Abs: 3.3 10*3/uL (ref 1.7–7.7)
Neutrophils Relative %: 68 %
Platelets: 245 10*3/uL (ref 150–400)
RBC: 4.07 MIL/uL — ABNORMAL LOW (ref 4.22–5.81)
RDW: 16.3 % — ABNORMAL HIGH (ref 11.5–15.5)
WBC: 4.8 10*3/uL (ref 4.0–10.5)
nRBC: 0 % (ref 0.0–0.2)

## 2020-11-10 LAB — COMPREHENSIVE METABOLIC PANEL
ALT: 25 U/L (ref 0–44)
AST: 22 U/L (ref 15–41)
Albumin: 4 g/dL (ref 3.5–5.0)
Alkaline Phosphatase: 90 U/L (ref 38–126)
Anion gap: 6 (ref 5–15)
BUN: 15 mg/dL (ref 8–23)
CO2: 25 mmol/L (ref 22–32)
Calcium: 8.7 mg/dL — ABNORMAL LOW (ref 8.9–10.3)
Chloride: 107 mmol/L (ref 98–111)
Creatinine, Ser: 1.02 mg/dL (ref 0.61–1.24)
GFR, Estimated: >60 mL/min (ref >60)
Glucose, Bld: 99 mg/dL (ref 70–99)
Potassium: 3.6 mmol/L (ref 3.5–5.1)
Sodium: 138 mmol/L (ref 135–145)
Total Bilirubin: 2 mg/dL — ABNORMAL HIGH (ref 0.3–1.2)
Total Protein: 6.9 g/dL (ref 6.5–8.1)

## 2020-11-10 LAB — MAGNESIUM: Magnesium: 1.8 mg/dL (ref 1.7–2.4)

## 2020-11-10 MED ORDER — HEPARIN SOD (PORK) LOCK FLUSH 100 UNIT/ML IV SOLN
500.0000 [IU] | Freq: Once | INTRAVENOUS | Status: AC
  Administered 2020-11-10: 500 [IU] via INTRAVENOUS

## 2020-11-10 MED ORDER — SODIUM CHLORIDE 0.9% FLUSH
10.0000 mL | Freq: Once | INTRAVENOUS | Status: AC
  Administered 2020-11-10: 10 mL via INTRAVENOUS

## 2020-11-10 NOTE — Progress Notes (Signed)
Bradley Rodriguez presented for PICC flush/labs and dressing change.  See IV assessment in docflowsheets for PICC details.  PICC located right arm.  Good blood return present. PICC flushed with 80ml NS and 250U Heparin, see MAR for further details.  Bradley Rodriguez tolerated procedure well and without incident.

## 2020-11-10 NOTE — Patient Instructions (Signed)
[facility]  Discharge Instructions: Thank you for choosing [facility] to provide your oncology and hematology care.  If you have a lab appointment with the [facility], please come in thru the Main Entrance and check in at the main information desk.  Wear comfortable clothing and clothing appropriate for easy access to any Portacath or PICC line.   We strive to give you quality time with your provider. You may need to reschedule your appointment if you arrive late (15 or more minutes).  Arriving late affects you and other patients whose appointments are after yours.  Also, if you miss three or more appointments without notifying the office, you may be dismissed from the clinic at the provider's discretion.      For prescription refill requests, have your pharmacy contact our office and allow 72 hours for refills to be completed.    Today you received the following chemotherapy and/or immunotherapy agents PICC flush labs and dressing change      To help prevent nausea and vomiting after your treatment, we encourage you to take your nausea medication as directed.  BELOW ARE SYMPTOMS THAT SHOULD BE REPORTED IMMEDIATELY: *FEVER GREATER THAN 100.4 F (38 C) OR HIGHER *CHILLS OR SWEATING *NAUSEA AND VOMITING THAT IS NOT CONTROLLED WITH YOUR NAUSEA MEDICATION *UNUSUAL SHORTNESS OF BREATH *UNUSUAL BRUISING OR BLEEDING *URINARY PROBLEMS (pain or burning when urinating, or frequent urination) *BOWEL PROBLEMS (unusual diarrhea, constipation, pain near the anus) TENDERNESS IN MOUTH AND THROAT WITH OR WITHOUT PRESENCE OF ULCERS (sore throat, sores in mouth, or a toothache) UNUSUAL RASH, SWELLING OR PAIN  UNUSUAL VAGINAL DISCHARGE OR ITCHING   Items with * indicate a potential emergency and should be followed up as soon as possible or go to the Emergency Department if any problems should occur.  Please show the CHEMOTHERAPY ALERT CARD or IMMUNOTHERAPY ALERT CARD at  check-in to the Emergency Department and triage nurse.  Should you have questions after your visit or need to cancel or reschedule your appointment, please contact [facility] [phone]  and follow the prompts.  Office hours are 8:00 a.m. to 4:30 p.m. Monday - Friday. Please note that voicemails left after 4:00 p.m. may not be returned until the following business day.  We are closed weekends and major holidays. You have access to a nurse at all times for urgent questions. Please call the main number to the clinic [phone] and follow the prompts.  For any non-urgent questions, you may also contact your provider using MyChart. We now offer e-Visits for anyone 32 and older to request care online for non-urgent symptoms. For details visit [email or web address].   Also download the MyChart app! Go to the app store, search "MyChart", open the app, select [facility], and log in with your MyChart username and password.  Due to Covid, a mask is required upon entering the hospital/clinic. If you do not have a mask, one will be given to you upon arrival. For doctor visits, patients may have 1 support person aged 71 or older with them. For treatment visits, patients cannot have anyone with them due to current Covid guidelines and our immunocompromised population.

## 2020-11-10 NOTE — Progress Notes (Signed)
Patient is taking Xeloda as prescribed.  He has not missed any doses and reports no side effects at this time.   

## 2020-11-10 NOTE — Patient Instructions (Signed)
[place] at [facility] Discharge Instructions  You were seen and examined by Dr. Delton Coombes. Your genetic testing shows you have a mutation called RAD51c. Return as scheduled next week for lab work, office visit, and treatment.    Thank you for choosing [place] [facility] at [facility] to provide your oncology and hematology care.  To afford each patient quality time with our provider, please arrive at least 15 minutes before your scheduled appointment time.   If you have a lab appointment with the [facility] please come in thru the Main Entrance and check in at the main information desk.  You need to re-schedule your appointment should you arrive 10 or more minutes late.  We strive to give you quality time with our providers, and arriving late affects you and other patients whose appointments are after yours.  Also, if you no show three or more times for appointments you may be dismissed from the clinic at the providers discretion.     Again, thank you for choosing [facility].  Our hope is that these requests will decrease the amount of time that you wait before being seen by our physicians.       _____________________________________________________________  Should you have questions after your visit to [facility], please contact our office at [phone] and follow the prompts.  Our office hours are 8:00 a.m. and 4:30 p.m. Monday - Friday.  Please note that voicemails left after 4:00 p.m. may not be returned until the following business day.  We are closed weekends and major holidays.  You do have access to a nurse 24-7, just call the main number to the clinic [phone] and do not press any options, hold on the line and a nurse will answer the phone.    For prescription refill requests, have your pharmacy contact our office and allow 72 hours.    Due to Covid, you will need to wear a mask upon entering the hospital.  If you do not have a mask, a mask will be given to you at the Main Entrance upon arrival. For doctor visits, patients may have 1 support person age 16 or older with them. For treatment visits, patients can not have anyone with them due to social distancing guidelines and our immunocompromised population.

## 2020-11-11 ENCOUNTER — Encounter (HOSPITAL_COMMUNITY): Payer: Self-pay | Admitting: Hematology

## 2020-11-11 ENCOUNTER — Other Ambulatory Visit (HOSPITAL_COMMUNITY): Payer: Self-pay

## 2020-11-11 LAB — CEA: CEA: 1.8 ng/mL (ref 0.0–4.7)

## 2020-11-14 ENCOUNTER — Other Ambulatory Visit (HOSPITAL_COMMUNITY): Payer: Self-pay

## 2020-11-16 ENCOUNTER — Encounter: Payer: Self-pay | Admitting: Genetic Counselor

## 2020-11-16 NOTE — Progress Notes (Signed)
Bradley Rodriguez, [place] [address]   CLINIC:  Medical Oncology/Hematology  PCP:  Caryl Bis, MD [address] [place] [address] [phone]   REASON FOR VISIT:  Follow-up for Follow-up for colon cancer  PRIOR THERAPY: Left nephrectomy by Dr. 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 A variant of uncertain significance was identified in the MET and RAD51D genes. Report date is 10/27/2020.   The Common Hereditary Cancers + RNA Panel offered by [facility] includes sequencing, deletion/duplication, and RNA testing of the following 47 genes: APC, ATM, AXIN2, BARD1, BMPR1A, BRCA1, BRCA2, BRIP1, CDH1, CDK4*, CDKN2A (p14ARF)*, CDKN2A (p16INK4a)*, CHEK2, CTNNA1, DICER1, EPCAM (Deletion/duplication testing only), GREM1 (promoter region deletion/duplication testing only), KIT, MEN1, MLH1, MSH2, MSH3, MSH6, MUTYH, NBN, NF1, NHTL1, PALB2, PDGFRA*, PMS2, POLD1, POLE, PTEN, RAD50, RAD51C, RAD51D, SDHB, SDHC, SDHD, SMAD4, SMARCA4. STK11, TP53, TSC1, TSC2, and VHL.  The following genes were evaluated for sequence changes only: SDHA and HOXB13 c.251G>A variant only.  RNA analysis is not performed for the * genes.       CANCER STAGING: Cancer Staging Cancer of ascending colon [facility]) Staging form: Colon and Rectum, AJCC 8th Edition - Clinical stage from 09/01/2020: Stage IIIB (cT3, cN1b, cM0) - Unsigned   INTERVAL HISTORY:  [name], a 59 y.o. male, returns for routine follow-up and consideration for next cycle of  chemotherapy. Bradley Rodriguez was last seen on 11/10/2020.  Due for cycle #2 of Xelox today.   Overall, he tells me he has been feeling pretty well. He reports increased numbness in the soles of his feet, but he denies numbness in hands, associated pain, or changes in gait. He reports increased cold sensation. He denies n/v/d aside from 1 episode of diarrhea the night after his last treatment.   Overall, he feels ready for next cycle of chemo today.   REVIEW OF SYSTEMS:  Review of Systems  Constitutional:  Negative for appetite change and fatigue.  Gastrointestinal:  Negative for diarrhea, nausea and vomiting.  Musculoskeletal:  Negative for gait problem.  Neurological:  Positive for numbness (feet). Negative for gait problem.  All other systems reviewed and are negative.  PAST MEDICAL/SURGICAL HISTORY:  Past Medical History:  Diagnosis Date   A-fib [facility])    DCCV 2009, 2012, 2014, Nov 2016   Anxiety    Arthritis    Knees, wrist, ankles   Cancer ([place]) 07/2019   Kidney   Diabetes mellitus without complication ([place])    Dysrhythmia 1996   A- Fib   GERD (gastroesophageal reflux disease)    History of kidney stones    HX: anticoagulation    very remotely and briefly on COumadin aound one of his CV, 2014 on Eliquis had hematuria with renal calculi and stopped   Hyperlipidemia    patient denies this   Hypertension    IBS (irritable bowel syndrome)    Normal cardiac stress test    Normal nuclear stress test , 2001   Past Surgical History:  Procedure Laterality Date   BIOPSY  08/19/2020   Procedure: BIOPSY;  Surgeon: Harvel Quale, MD;  [facility];  Service: Gastroenterology;;   CARDIOVERSION N/A 10/15/2012   Procedure: CARDIOVERSION;  Surgeon: Larey Dresser, MD;  Location: [facility] ENDOSCOPY;  Service: Cardiovascular;  Laterality: N/A;   CARDIOVERSION N/A 11/26/2014   Procedure: CARDIOVERSION;  Surgeon: Arnoldo Lenis, MD;  Location: [facility] ORS;  Service: Endoscopy;   Laterality: N/A;   CARDIOVERSION N/A 11/15/2015   Procedure: CARDIOVERSION;  Surgeon: Jerline Pain, MD;  Location: [place];  Service: Cardiovascular;  Laterality: N/A;   CARDIOVERSION N/A 08/07/2016   Procedure: CARDIOVERSION;  Surgeon: Herminio Commons, MD;  Location: [facility];  Service: Cardiovascular;  Laterality: N/A;   CARDIOVERSION N/A 09/11/2018   Procedure: CARDIOVERSION;  Surgeon: Arnoldo Lenis, MD;  Location: [facility];  Service: Endoscopy;  Laterality: N/A;   CATARACT EXTRACTION W/PHACO Right 01/18/2014   Procedure: CATARACT EXTRACTION PHACO AND INTRAOCULAR LENS PLACEMENT (IOC);  Surgeon: Tonny Branch, MD;  Location: [facility];  Service: Ophthalmology;  Laterality: Right;  CDE 9.95   COLONOSCOPY WITH PROPOFOL N/A 08/19/2020   Procedure: COLONOSCOPY WITH PROPOFOL;  Surgeon: Harvel Quale, MD;  [facility];  Service: Gastroenterology;  Laterality: N/A;  8:20   CYSTOSCOPY WITH LITHOLAPAXY N/A 10/06/2019   Procedure: CYSTOSCOPY WITH LASER LITHOLAPAXY;  Surgeon: Ceasar Mons, MD;  [facility];  Service: Urology;  Laterality: N/A;   ESOPHAGOGASTRODUODENOSCOPY (EGD) WITH PROPOFOL N/A 08/19/2020   Procedure: ESOPHAGOGASTRODUODENOSCOPY (EGD) WITH PROPOFOL;  Surgeon: Harvel Quale, MD;  [facility];  Service: Gastroenterology;  Laterality: N/A;   EYE SURGERY Right    detached retina   FOOT SURGERY Left    KNEE ARTHROSCOPY Right    LAPAROSCOPIC RIGHT HEMI COLECTOMY N/A 09/30/2020   Procedure: LAPAROSCOPIC RIGHT HEMI COLECTOMY;  Surgeon: Ileana Roup, MD;  [facility];  Service: General;  Laterality: N/A;   LITHOTRIPSY     for kidney stones 2002   POLYPECTOMY  08/19/2020   Procedure: POLYPECTOMY INTESTINAL;  Surgeon: Harvel Quale, MD;  [facility];  Service: Gastroenterology;;   POLYPECTOMY  08/19/2020   Procedure: POLYPECTOMY;  Surgeon: Harvel Quale, MD;  [facility];   Service: Gastroenterology;;   ROBOT ASSISTED LAPAROSCOPIC NEPHRECTOMY Left 10/06/2019   Procedure: XI ROBOTIC ASSISTED LAPAROSCOPIC NEPHRECTOMY WITH ADRENALECTOMY;  Surgeon: Ceasar Mons, MD;  [facility];  Service: Urology;  Laterality: Left;   SUBMUCOSAL TATTOO INJECTION  08/19/2020   Procedure: SUBMUCOSAL TATTOO INJECTION;  Surgeon: Harvel Quale, MD;  [facility];  Service: Gastroenterology;;   TEE WITHOUT CARDIOVERSION N/A 11/26/2014   Procedure: TRANSESOPHAGEAL ECHOCARDIOGRAM (TEE) WITH PROPOFOL;  Surgeon: Arnoldo Lenis, MD;  [facility];  Service: Endoscopy;  Laterality: N/A;   TEE WITHOUT CARDIOVERSION N/A 11/15/2015   Procedure: TRANSESOPHAGEAL ECHOCARDIOGRAM (TEE);  Surgeon: Jerline Pain, MD;  Location: [place];  Service: Cardiovascular;  Laterality: N/A;   TEE WITHOUT CARDIOVERSION N/A 09/11/2018   Procedure: TRANSESOPHAGEAL ECHOCARDIOGRAM (TEE) WITH PROPOFOL;  Surgeon: Arnoldo Lenis, MD;  [facility];  Service: Endoscopy;  Laterality: N/A;   VASECTOMY      SOCIAL HISTORY:  Social History   Socioeconomic History   Marital status: Married    Spouse name: Not on file   Number of children: Not on file   Years of education: Not on file   Highest education level: Not on file  Occupational History   Not on file  Tobacco Use   Smoking status: Never   Smokeless tobacco: Never  Vaping Use   Vaping Use: Never used  Substance and Sexual Activity   Alcohol use: No    Alcohol/week: 0.0 standard drinks   Drug use: No   Sexual activity: Yes    Birth control/protection: Surgical  Other Topics Concern   Not on file  Social History Narrative   Not on file   Social Determinants of Health   Financial Resource Strain: Low Risk    Difficulty of Paying Living Expenses: Not hard at all  Food Insecurity: No Food Insecurity   Worried About Charity fundraiser in the Last Year: Never true   [place] in the Last  Year: Never true  Transportation Needs: No Transportation Needs   Lack of Transportation (Medical): No   Lack of Transportation (Non-Medical): No  Physical Activity: Inactive   Days of Exercise per Week: 0 days   Minutes of Exercise per Session: 0 min  Stress: No Stress Concern Present   Feeling of Stress : Not at all  Social Connections: Moderately Integrated   Frequency of Communication with Friends and Family: More than three times a week   Frequency of Social Gatherings with Friends and Family: More than three times a week   Attends Religious Services: More than 4 times per year   Active Member of [employer] or Organizations: No   Attends Archivist Meetings: Never   Marital Status: Married  Human resources officer Violence: Not At Risk   Fear of Current or Ex-Partner: No   Emotionally Abused: No   Physically Abused: No   Sexually Abused: No    FAMILY HISTORY:  Family History  Problem Relation Age of Onset   Diabetes Mother    Hypertension Mother    Colon cancer Mother        dx. 35s   Diabetes Father    Colon cancer Father        dx. 83s   Lung cancer Maternal Grandmother        smoked   Bone cancer Maternal Grandfather        dx. 80s   Hypertension Other     CURRENT MEDICATIONS:  Current Outpatient Medications  Medication Sig Dispense Refill   amLODipine (NORVASC) 5 MG tablet TAKE 1 TABLET BY MOUTH EVERY DAY (Patient taking differently: Take 5 mg by mouth every evening.) 90 tablet 3   atorvastatin (LIPITOR) 20 MG tablet Take 20 mg by mouth every evening.      Bioflavonoid Products (ESTER C PO) Take 1,000 mg by mouth daily.     Calcium Citrate-Vitamin D (CITRACAL + D PO) Take 1 tablet by mouth daily.     capecitabine (XELODA) 500 MG tablet Take 4 tablets (2,000 mg total) by mouth 2 (two) times daily after a meal. Take for 14 days, then hold for 7 days. Repeat every 21 days. 112 tablet 3   cholecalciferol (VITAMIN D3) 25 MCG (1000 UNIT) tablet Take 1,000 Units by  mouth daily.     iron polysaccharides (NIFEREX) 150 MG capsule Take 150 mg by mouth 2 (two) times daily.     losartan (COZAAR) 100 MG tablet Take 100 mg by mouth every evening.      metoprolol tartrate (LOPRESSOR) 100 MG tablet TAKE 1 TABLET BY MOUTH TWICE A DAY (Patient taking differently: Take 100 mg by mouth 2 (two) times daily.) 60 tablet 2  omeprazole (PRILOSEC) 40 MG capsule TAKE 1 CAPSULE BY MOUTH TWICE A DAY 180 capsule 1   OVER THE COUNTER MEDICATION Take 1 tablet by mouth 2 (two) times daily. Glucocil otc supplement     OXALIPLATIN IV Inject into the vein every 21 ( twenty-one) days.     prochlorperazine (COMPAZINE) 10 MG tablet Take 1 tablet (10 mg total) by mouth every 6 (six) hours as needed (Nausea or vomiting). (Patient not taking: Reported on 11/10/2020) 30 tablet 1   Semaglutide (OZEMPIC, 0.25 OR 0.5 MG/DOSE, [place]) Inject 0.5 mg into the skin every Monday.      Vitamin D, Ergocalciferol, (DRISDOL) 50000 UNITS CAPS capsule Take 50,000 Units by mouth every Wednesday.     zinc gluconate 50 MG tablet Take 50 mg by mouth daily.     No current facility-administered medications for this visit.    ALLERGIES:  Allergies  Allergen Reactions   Ace Inhibitors Cough   Lisinopril Cough   Xarelto [Rivaroxaban] Other (See Comments)    Heart out of rhythm    Quinine Derivatives Rash    PHYSICAL EXAM:  Performance status (ECOG): 1 - Symptomatic but completely ambulatory  There were no vitals filed for this visit. Wt Readings from Last 3 Encounters:  11/10/20 298 lb (135.2 kg)  10/27/20 299 lb 13.2 oz (136 kg)  10/13/20 (!) 300 lb 0.7 oz (136.1 kg)   Physical Exam Vitals reviewed.  Constitutional:      Appearance: Normal appearance.  Cardiovascular:     Rate and Rhythm: Normal rate and regular rhythm.     Pulses: Normal pulses.     Heart sounds: Normal heart sounds.  Pulmonary:     Effort: Pulmonary effort is normal.     Breath sounds: Normal breath sounds.  Musculoskeletal:      Right lower leg: Edema (trace) present.     Left lower leg: Edema (trace) present.  Neurological:     General: No focal deficit present.     Mental Status: He is alert and oriented to person, place, and time.  Psychiatric:        Mood and Affect: Mood normal.        Behavior: Behavior normal.    LABORATORY DATA:  I have reviewed the labs as listed.  CBC Latest Ref Rng & Units 11/10/2020 10/27/2020 10/13/2020  WBC 4.0 - 10.5 K/uL 4.8 5.4 6.4  Hemoglobin 13.0 - 17.0 g/dL 10.4(L) 11.2(L) 10.3(L)  Hematocrit 39.0 - 52.0 % 33.1(L) 37.3(L) 34.2(L)  Platelets 150 - 400 K/uL 245 327 384   CMP Latest Ref Rng & Units 11/10/2020 10/27/2020 10/13/2020  Glucose 70 - 99 mg/dL 99 124(H) 105(H)  BUN 8 - 23 mg/dL 15 20 15   Creatinine 0.61 - 1.24 mg/dL 1.02 1.27(H) 1.20  Sodium 135 - 145 mmol/L 138 138 137  Potassium 3.5 - 5.1 mmol/L 3.6 4.5 4.8  Chloride 98 - 111 mmol/L 107 106 105  CO2 22 - 32 mmol/L 25 26 23   Calcium 8.9 - 10.3 mg/dL 8.7(L) 9.0 9.3  Total Protein 6.5 - 8.1 g/dL 6.9 7.5 7.6  Total Bilirubin 0.3 - 1.2 mg/dL 2.0(H) 1.3(H) 0.7  Alkaline Phos 38 - 126 U/L 90 108 108  AST 15 - 41 U/L 22 20 20   ALT 0 - 44 U/L 25 24 28     DIAGNOSTIC IMAGING:  I have independently reviewed the scans and discussed with the patient. [name] EKG SITE RITE  Result Date: 10/27/2020 If Site Rite image not attached, placement  could not be confirmed due to current cardiac rhythm.    ASSESSMENT:  1.  Right-sided colon cancer: - He had 3 prior colonoscopies, last one 2 years ago by Dr. Wonda Cheng in [place], with multiple polyps removed. - Colonoscopy by Dr. Jenetta Downer on 08/19/2020 showed fungating infiltrative ulcerated partially obstructing large mass in the proximal ascending colon.  Mass was partially circumferential.  7 sessile polyps found in the transverse colon, ascending colon and cecum. - Pathology consistent with adenocarcinoma of the biopsy of the mass.  Single fragment of at least intramucosal  adenocarcinoma in the transverse colon polypectomy.  Pathology report has descending colon as adenocarcinoma. - CT CAP with contrast on 08/26/2020 shows circumferential lesion at the hepatic flexure of the colon consistent with primary colorectal carcinoma with no pathologically enlarged lymph node metastasis.  No liver metastasis.  Status post left nephrectomy.  No evidence of thoracic metastasis. - Even though descending colon polypectomy was reported as adenocarcinoma, Dr. Jenetta Downer felt that it was mislabeled.  Mass was reported in the proximal ascending colon on colonoscopy and close to hepatic flexure on CT scan. - Right hemicolectomy on 09/30/2020 - Pathology PT3PN1B, 2/30 lymph nodes positive, 1 tumor deposit, margins negative, MSI stable no lymphovascular or perineural invasion, grade 2, no perforation. - Cycle 1 of [place] ox on 10/27/2020.  2.  Left kidney renal cell carcinoma: - Incidentally found on work-up for kidney stones. - Left nephrectomy by Dr. Gilford Rile on 10/06/2019 with pathology showing clear-cell renal cell carcinoma, grade 2, 6 cm, margins negative.  1 benign lymph node.  PT1BPN0.  3.  Social/family history: - He is a disabled.  He worked at [employer] and a [employer].  He might have exposed to some chemicals at the later job.  No prior history of smoking. - Family history significant for mother with colon cancer, father with colon cancer and maternal grandfather with bone cancer.   PLAN:  1.  Stage III (PT3BPN1B right-sided colon cancer: - He has tolerated cycle 1 of [place] ox fairly well.  Did not have any issues with nausea.  Had 1 episode of diarrhea previously which was controlled with half tablet of Imodium. - Labs reviewed today shows total bilirubin improved to 1.3.  CBC shows white count and platelet count adequate.  Last CEA was 1.8 on 11/10/2020. - Proceed with oxaliplatin 130 mg/M2 today. - He will start Xeloda 4 tablets twice daily later tonight  and continue for 2 weeks. - RTC 3 weeks for follow-up prior to cycle 3.  2.  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 - This is consistent with increased risk of ovarian breast cancer in females.  Testing family members was recommended.  3.  Peripheral neuropathy: - He reported slight worsening of numbness in the feet in the last 1 week.  No neuropathic pains.  No balance issues. - No worsening of numbness in the fingertips. - We will maintain the current dose of oxaliplatin.   Orders placed this encounter:  No orders of the defined types were placed in this encounter.    Derek Jack, MD [place] [phone]   I, Thana Ates, am acting as a scribe for Dr. Derek Jack.  I, Derek Jack MD, have reviewed the above documentation for accuracy and completeness, and I agree with the above.

## 2020-11-17 ENCOUNTER — Inpatient Hospital Stay (HOSPITAL_COMMUNITY): Payer: Medicare HMO

## 2020-11-17 ENCOUNTER — Other Ambulatory Visit: Payer: Self-pay

## 2020-11-17 ENCOUNTER — Inpatient Hospital Stay (HOSPITAL_BASED_OUTPATIENT_CLINIC_OR_DEPARTMENT_OTHER): Payer: Medicare HMO | Admitting: Hematology

## 2020-11-17 VITALS — BP 139/86 | HR 70 | Temp 97.0°F | Resp 18 | Wt 299.2 lb

## 2020-11-17 VITALS — BP 120/77 | HR 62 | Temp 97.4°F | Resp 18 | Wt 297.2 lb

## 2020-11-17 DIAGNOSIS — E785 Hyperlipidemia, unspecified: Secondary | ICD-10-CM | POA: Diagnosis not present

## 2020-11-17 DIAGNOSIS — C182 Malignant neoplasm of ascending colon: Secondary | ICD-10-CM

## 2020-11-17 DIAGNOSIS — R200 Anesthesia of skin: Secondary | ICD-10-CM | POA: Diagnosis not present

## 2020-11-17 DIAGNOSIS — D649 Anemia, unspecified: Secondary | ICD-10-CM | POA: Diagnosis not present

## 2020-11-17 DIAGNOSIS — C642 Malignant neoplasm of left kidney, except renal pelvis: Secondary | ICD-10-CM | POA: Diagnosis not present

## 2020-11-17 DIAGNOSIS — Z5111 Encounter for antineoplastic chemotherapy: Secondary | ICD-10-CM | POA: Diagnosis not present

## 2020-11-17 DIAGNOSIS — I4891 Unspecified atrial fibrillation: Secondary | ICD-10-CM | POA: Diagnosis not present

## 2020-11-17 DIAGNOSIS — K589 Irritable bowel syndrome without diarrhea: Secondary | ICD-10-CM | POA: Diagnosis not present

## 2020-11-17 DIAGNOSIS — I10 Essential (primary) hypertension: Secondary | ICD-10-CM | POA: Diagnosis not present

## 2020-11-17 DIAGNOSIS — K219 Gastro-esophageal reflux disease without esophagitis: Secondary | ICD-10-CM | POA: Diagnosis not present

## 2020-11-17 LAB — CBC WITH DIFFERENTIAL/PLATELET
Abs Immature Granulocytes: 0.03 10*3/uL (ref 0.00–0.07)
Basophils Absolute: 0 10*3/uL (ref 0.0–0.1)
Basophils Relative: 1 %
Eosinophils Absolute: 0.1 10*3/uL (ref 0.0–0.5)
Eosinophils Relative: 3 %
HCT: 37.4 % — ABNORMAL LOW (ref 39.0–52.0)
Hemoglobin: 11.8 g/dL — ABNORMAL LOW (ref 13.0–17.0)
Immature Granulocytes: 1 %
Lymphocytes Relative: 14 %
Lymphs Abs: 0.6 10*3/uL — ABNORMAL LOW (ref 0.7–4.0)
MCH: 26.9 pg (ref 26.0–34.0)
MCHC: 31.6 g/dL (ref 30.0–36.0)
MCV: 85.2 fL (ref 80.0–100.0)
Monocytes Absolute: 0.6 10*3/uL (ref 0.1–1.0)
Monocytes Relative: 13 %
Neutro Abs: 2.9 10*3/uL (ref 1.7–7.7)
Neutrophils Relative %: 68 %
Platelets: 256 10*3/uL (ref 150–400)
RBC: 4.39 MIL/uL (ref 4.22–5.81)
RDW: 19.1 % — ABNORMAL HIGH (ref 11.5–15.5)
WBC: 4.3 10*3/uL (ref 4.0–10.5)
nRBC: 0 % (ref 0.0–0.2)

## 2020-11-17 LAB — COMPREHENSIVE METABOLIC PANEL
ALT: 22 U/L (ref 0–44)
AST: 20 U/L (ref 15–41)
Albumin: 4.2 g/dL (ref 3.5–5.0)
Alkaline Phosphatase: 108 U/L (ref 38–126)
Anion gap: 8 (ref 5–15)
BUN: 23 mg/dL (ref 8–23)
CO2: 26 mmol/L (ref 22–32)
Calcium: 9.2 mg/dL (ref 8.9–10.3)
Chloride: 104 mmol/L (ref 98–111)
Creatinine, Ser: 1.11 mg/dL (ref 0.61–1.24)
GFR, Estimated: >60 mL/min (ref >60)
Glucose, Bld: 114 mg/dL — ABNORMAL HIGH (ref 70–99)
Potassium: 4.2 mmol/L (ref 3.5–5.1)
Sodium: 138 mmol/L (ref 135–145)
Total Bilirubin: 1.3 mg/dL — ABNORMAL HIGH (ref 0.3–1.2)
Total Protein: 7.5 g/dL (ref 6.5–8.1)

## 2020-11-17 LAB — MAGNESIUM: Magnesium: 2 mg/dL (ref 1.7–2.4)

## 2020-11-17 MED ORDER — DEXTROSE 5 % IV SOLN
130.0000 mg/m2 | Freq: Once | INTRAVENOUS | Status: AC
  Administered 2020-11-17: 360 mg via INTRAVENOUS
  Filled 2020-11-17: qty 72

## 2020-11-17 MED ORDER — HEPARIN SOD (PORK) LOCK FLUSH 100 UNIT/ML IV SOLN
250.0000 [IU] | Freq: Once | INTRAVENOUS | Status: AC | PRN
  Administered 2020-11-17: 250 [IU]

## 2020-11-17 MED ORDER — PALONOSETRON HCL INJECTION 0.25 MG/5ML
0.2500 mg | Freq: Once | INTRAVENOUS | Status: AC
  Administered 2020-11-17: 0.25 mg via INTRAVENOUS
  Filled 2020-11-17: qty 5

## 2020-11-17 MED ORDER — SODIUM CHLORIDE 0.9% FLUSH
10.0000 mL | INTRAVENOUS | Status: DC | PRN
  Administered 2020-11-17: 10 mL

## 2020-11-17 MED ORDER — DEXTROSE 5 % IV SOLN: Freq: Once | INTRAVENOUS | Status: AC

## 2020-11-17 MED ORDER — SODIUM CHLORIDE 0.9 % IV SOLN
10.0000 mg | Freq: Once | INTRAVENOUS | Status: AC
  Administered 2020-11-17: 10 mg via INTRAVENOUS
  Filled 2020-11-17: qty 1

## 2020-11-17 NOTE — Progress Notes (Signed)
Bradley Rodriguez presented for PICC line flush, blood work,  and dressing change.  See IV assessment in docflowsheets for PICC details.  Proper placement of PICC confirmed by CXR.  PICC located right arm.  Good blood return present. PICC flushed with 52ml NS.   Bradley Rodriguez tolerated procedure well and without incident.

## 2020-11-17 NOTE — Addendum Note (Signed)
Addended by: Joie Bimler on: 11/17/2020 03:41 PM   Modules accepted: Orders

## 2020-11-17 NOTE — Progress Notes (Signed)
Patient is taking Xeloda as prescribed.  He has not missed any doses and reports no side effects at this time.   

## 2020-11-17 NOTE — Progress Notes (Signed)
Patient presents today for Oxaliplatin per providers orders.  Labs and vitals within parameters for treatment.  Patient has no new complaints at this time.    Oxaliplatin infusion given today per MD orders.  Stable during infusion without adverse affects.  Vital signs stable.  No complaints at this time.  Discharge from clinic ambulatory in stable condition.  Alert and oriented X 3.  Follow up with [facility] as scheduled.  Discharge from clinic ambulatory in stable condition.  Alert and oriented X 3.  Follow up with [facility] as scheduled.

## 2020-11-17 NOTE — Progress Notes (Signed)
Patient has been examined, vital signs and labs have been reviewed by Dr. Katragadda. ANC, Creatinine, LFTs, hemoglobin, and platelets are within treatment parameters per Dr. Katragadda. Patient may proceed with treatment per M.D.   

## 2020-11-17 NOTE — Patient Instructions (Addendum)
[place] at [facility] Discharge Instructions   You were seen and examined by Dr. Delton Coombes. We will proceed with your treatment today. Continue Xeloda as prescribed. Return as scheduled in 3 weeks for lab work, office visit, and treatment.     Thank you for choosing [place] at [facility] to provide your oncology and hematology care.  To afford each patient quality time with our provider, please arrive at least 15 minutes before your scheduled appointment time.   If you have a lab appointment with the [facility] please come in thru the Main Entrance and check in at the main information desk.  You need to re-schedule your appointment should you arrive 10 or more minutes late.  We strive to give you quality time with our providers, and arriving late affects you and other patients whose appointments are after yours.  Also, if you no show three or more times for appointments you may be dismissed from the clinic at the providers discretion.     Again, thank you for choosing [facility].  Our hope is that these requests will decrease the amount of time that you wait before being seen by our physicians.       _____________________________________________________________  Should you have questions after your visit to [facility], please contact our office at [phone] and follow the prompts.  Our office hours are 8:00 a.m. and 4:30 p.m. Monday - Friday.  Please note that voicemails left after 4:00 p.m. may not be returned until the following business day.  We are closed weekends and major holidays.  You do have access to a nurse 24-7, just call the main number to the clinic [phone] and do not press any options, hold on the line and a nurse will answer the phone.    For prescription refill requests, have your pharmacy contact our office and allow 72 hours.    Due to Covid, you will need to wear a mask upon  entering the hospital. If you do not have a mask, a mask will be given to you at the Main Entrance upon arrival. For doctor visits, patients may have 1 support person age 61 or older with them. For treatment visits, patients can not have anyone with them due to social distancing guidelines and our immunocompromised population.

## 2020-11-17 NOTE — Patient Instructions (Signed)
[place]  Discharge Instructions: Thank you for choosing [facility] to provide your oncology and hematology care.  If you have a lab appointment with the [facility], please come in thru the Main Entrance and check in at the main information desk.  Wear comfortable clothing and clothing appropriate for easy access to any Portacath or PICC line.   We strive to give you quality time with your provider. You may need to reschedule your appointment if you arrive late (15 or more minutes).  Arriving late affects you and other patients whose appointments are after yours.  Also, if you miss three or more appointments without notifying the office, you may be dismissed from the clinic at the provider's discretion.      For prescription refill requests, have your pharmacy contact our office and allow 72 hours for refills to be completed.    Today you received the following chemotherapy and/or immunotherapy agents Oxaliplatin      To help prevent nausea and vomiting after your treatment, we encourage you to take your nausea medication as directed.  BELOW ARE SYMPTOMS THAT SHOULD BE REPORTED IMMEDIATELY: *FEVER GREATER THAN 100.4 F (38 C) OR HIGHER *CHILLS OR SWEATING *NAUSEA AND VOMITING THAT IS NOT CONTROLLED WITH YOUR NAUSEA MEDICATION *UNUSUAL SHORTNESS OF BREATH *UNUSUAL BRUISING OR BLEEDING *URINARY PROBLEMS (pain or burning when urinating, or frequent urination) *BOWEL PROBLEMS (unusual diarrhea, constipation, pain near the anus) TENDERNESS IN MOUTH AND THROAT WITH OR WITHOUT PRESENCE OF ULCERS (sore throat, sores in mouth, or a toothache) UNUSUAL RASH, SWELLING OR PAIN  UNUSUAL VAGINAL DISCHARGE OR ITCHING   Items with * indicate a potential emergency and should be followed up as soon as possible or go to the Emergency Department if any problems should occur.  Please show the CHEMOTHERAPY ALERT CARD or IMMUNOTHERAPY ALERT CARD at check-in to the Emergency  Department and triage nurse.  Should you have questions after your visit or need to cancel or reschedule your appointment, please contact [facility] [phone]  and follow the prompts.  Office hours are 8:00 a.m. to 4:30 p.m. Monday - Friday. Please note that voicemails left after 4:00 p.m. may not be returned until the following business day.  We are closed weekends and major holidays. You have access to a nurse at all times for urgent questions. Please call the main number to the clinic [phone] and follow the prompts.  For any non-urgent questions, you may also contact your provider using MyChart. We now offer e-Visits for anyone 97 and older to request care online for non-urgent symptoms. For details visit [email or web address].   Also download the MyChart app! Go to the app store, search "MyChart", open the app, select [place], and log in with your MyChart username and password.  Due to Covid, a mask is required upon entering the hospital/clinic. If you do not have a mask, one will be given to you upon arrival. For doctor visits, patients may have 1 support person aged 64 or older with them. For treatment visits, patients cannot have anyone with them due to current Covid guidelines and our immunocompromised population.

## 2020-11-21 DIAGNOSIS — E7849 Other hyperlipidemia: Secondary | ICD-10-CM | POA: Diagnosis not present

## 2020-11-21 DIAGNOSIS — E1165 Type 2 diabetes mellitus with hyperglycemia: Secondary | ICD-10-CM | POA: Diagnosis not present

## 2020-11-21 DIAGNOSIS — I480 Paroxysmal atrial fibrillation: Secondary | ICD-10-CM | POA: Diagnosis not present

## 2020-11-21 DIAGNOSIS — Z7984 Long term (current) use of oral hypoglycemic drugs: Secondary | ICD-10-CM | POA: Diagnosis not present

## 2020-11-21 DIAGNOSIS — I10 Essential (primary) hypertension: Secondary | ICD-10-CM | POA: Diagnosis not present

## 2020-11-25 ENCOUNTER — Encounter (HOSPITAL_COMMUNITY): Payer: Self-pay

## 2020-11-25 ENCOUNTER — Inpatient Hospital Stay (HOSPITAL_COMMUNITY): Payer: Medicare HMO | Attending: Hematology

## 2020-11-25 ENCOUNTER — Other Ambulatory Visit: Payer: Self-pay

## 2020-11-25 VITALS — BP 129/77 | HR 57 | Temp 98.1°F | Resp 18

## 2020-11-25 DIAGNOSIS — E785 Hyperlipidemia, unspecified: Secondary | ICD-10-CM | POA: Insufficient documentation

## 2020-11-25 DIAGNOSIS — C182 Malignant neoplasm of ascending colon: Secondary | ICD-10-CM | POA: Diagnosis not present

## 2020-11-25 DIAGNOSIS — M129 Arthropathy, unspecified: Secondary | ICD-10-CM | POA: Insufficient documentation

## 2020-11-25 DIAGNOSIS — Z452 Encounter for adjustment and management of vascular access device: Secondary | ICD-10-CM

## 2020-11-25 DIAGNOSIS — E119 Type 2 diabetes mellitus without complications: Secondary | ICD-10-CM | POA: Insufficient documentation

## 2020-11-25 DIAGNOSIS — Z79899 Other long term (current) drug therapy: Secondary | ICD-10-CM | POA: Insufficient documentation

## 2020-11-25 DIAGNOSIS — Z5111 Encounter for antineoplastic chemotherapy: Secondary | ICD-10-CM | POA: Diagnosis not present

## 2020-11-25 DIAGNOSIS — G629 Polyneuropathy, unspecified: Secondary | ICD-10-CM | POA: Diagnosis not present

## 2020-11-25 DIAGNOSIS — I4891 Unspecified atrial fibrillation: Secondary | ICD-10-CM | POA: Insufficient documentation

## 2020-11-25 DIAGNOSIS — D509 Iron deficiency anemia, unspecified: Secondary | ICD-10-CM | POA: Diagnosis not present

## 2020-11-25 DIAGNOSIS — I10 Essential (primary) hypertension: Secondary | ICD-10-CM | POA: Insufficient documentation

## 2020-11-25 DIAGNOSIS — K219 Gastro-esophageal reflux disease without esophagitis: Secondary | ICD-10-CM | POA: Insufficient documentation

## 2020-11-25 MED ORDER — HEPARIN SOD (PORK) LOCK FLUSH 100 UNIT/ML IV SOLN
250.0000 [IU] | Freq: Once | INTRAVENOUS | Status: AC
  Administered 2020-11-25: 250 [IU] via INTRAVENOUS

## 2020-11-25 MED ORDER — SODIUM CHLORIDE 0.9% FLUSH
10.0000 mL | Freq: Once | INTRAVENOUS | Status: AC
  Administered 2020-11-25: 10 mL via INTRAVENOUS

## 2020-11-25 NOTE — Patient Instructions (Signed)
[facility]  Discharge Instructions: Thank you for choosing [facility] to provide your oncology and hematology care.  If you have a lab appointment with the Cancer Center, please come in thru the Main Entrance and check in at the main information desk.  Wear comfortable clothing and clothing appropriate for easy access to any Portacath or PICC line.   We strive to give you quality time with your provider. You may need to reschedule your appointment if you arrive late (15 or more minutes).  Arriving late affects you and other patients whose appointments are after yours.  Also, if you miss three or more appointments without notifying the office, you may be dismissed from the clinic at the provider's discretion.      For prescription refill requests, have your pharmacy contact our office and allow 72 hours for refills to be completed.    Today your port was flushed. Return as scheduled.     To help prevent nausea and vomiting after your treatment, we encourage you to take your nausea medication as directed.  BELOW ARE SYMPTOMS THAT SHOULD BE REPORTED IMMEDIATELY: *FEVER GREATER THAN 100.4 F (38 C) OR HIGHER *CHILLS OR SWEATING *NAUSEA AND VOMITING THAT IS NOT CONTROLLED WITH YOUR NAUSEA MEDICATION *UNUSUAL SHORTNESS OF BREATH *UNUSUAL BRUISING OR BLEEDING *URINARY PROBLEMS (pain or burning when urinating, or frequent urination) *BOWEL PROBLEMS (unusual diarrhea, constipation, pain near the anus) TENDERNESS IN MOUTH AND THROAT WITH OR WITHOUT PRESENCE OF ULCERS (sore throat, sores in mouth, or a toothache) UNUSUAL RASH, SWELLING OR PAIN  UNUSUAL VAGINAL DISCHARGE OR ITCHING   Items with * indicate a potential emergency and should be followed up as soon as possible or go to the Emergency Department if any problems should occur.  Please show the CHEMOTHERAPY ALERT CARD or IMMUNOTHERAPY ALERT CARD at check-in to the Emergency Department and triage nurse.  Should  you have questions after your visit or need to cancel or reschedule your appointment, please contact [facility] [phone]  and follow the prompts.  Office hours are 8:00 a.m. to 4:30 p.m. Monday - Friday. Please note that voicemails left after 4:00 p.m. may not be returned until the following business day.  We are closed weekends and major holidays. You have access to a nurse at all times for urgent questions. Please call the main number to the clinic [phone] and follow the prompts.  For any non-urgent questions, you may also contact your provider using MyChart. We now offer e-Visits for anyone 18 and older to request care online for non-urgent symptoms. For details visit [email or web address].   Also download the MyChart app! Go to the app store, search "MyChart", open the app, select , and log in with your MyChart username and password.  Due to Covid, a mask is required upon entering the hospital/clinic. If you do not have a mask, one will be given to you upon arrival. For doctor visits, patients may have 1 support person aged 18 or older with them. For treatment visits, patients cannot have anyone with them due to current Covid guidelines and our immunocompromised population.  

## 2020-11-25 NOTE — Progress Notes (Signed)
PICC flushed with good blood return noted. No bruising or swelling at site. PICC line dressing changed and patient discharged in satisfactory condition. VVS stable with no signs or symptoms of distressed noted.

## 2020-11-29 ENCOUNTER — Other Ambulatory Visit (HOSPITAL_COMMUNITY): Payer: Self-pay

## 2020-12-01 ENCOUNTER — Other Ambulatory Visit (HOSPITAL_COMMUNITY): Payer: Self-pay

## 2020-12-06 NOTE — Progress Notes (Signed)
Bradley Rodriguez, Bradley Rodriguez [id number]   CLINIC:  Medical Oncology/Hematology  PCP:  Bradley Bis, MD [address] [place] [address] [phone]   REASON FOR VISIT:  Follow-up for colon cancer  PRIOR THERAPY: Left nephrectomy by Dr. 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 A variant of uncertain significance was identified in the MET and RAD51D genes. Report date is 10/27/2020.   The Common Hereditary Cancers + RNA Panel offered by [facility] includes sequencing, deletion/duplication, and RNA testing of the following 47 genes: APC, ATM, AXIN2, BARD1, BMPR1A, BRCA1, BRCA2, BRIP1, CDH1, CDK4*, CDKN2A (p14ARF)*, CDKN2A (p16INK4a)*, CHEK2, CTNNA1, DICER1, EPCAM (Deletion/duplication testing only), GREM1 (promoter region deletion/duplication testing only), KIT, MEN1, MLH1, MSH2, MSH3, MSH6, MUTYH, NBN, NF1, NHTL1, PALB2, PDGFRA*, PMS2, POLD1, POLE, PTEN, RAD50, RAD51C, RAD51D, SDHB, SDHC, SDHD, SMAD4, SMARCA4. STK11, TP53, TSC1, TSC2, and VHL.  The following genes were evaluated for sequence changes only: SDHA and HOXB13 c.251G>A variant only.  RNA analysis is not performed for the * genes.       CANCER STAGING: Cancer Staging Cancer of ascending colon [facility]) Staging form: Colon and Rectum, AJCC 8th Edition - Clinical stage from 09/01/2020: Stage IIIB (cT3, cN1b, cM0) - Unsigned   INTERVAL HISTORY:  Bradley Rodriguez, a 60 y.o. male, returns for routine follow-up and consideration for next cycle of chemotherapy. Bradley Rodriguez was  last seen on 11/17/2020.  Due for cycle #3 of Xelox today.   Overall, he tells me he has been feeling pretty well. He denies n/v/d, but reports decreased appetite and changed taste for 4-5 days after treatment. He also reports loose stools for 3-4 days following treatment, and slight difficulty swallowing on 10/28. He reports stable numbness in his feet. He reports cold sensation in his hands. He denies mouth sores.   Overall, he feels ready for next cycle of chemo today.   REVIEW OF SYSTEMS:  Review of Systems  Constitutional:  Negative for appetite change and fatigue (80%).  HENT:   Negative for mouth sores.   Gastrointestinal:  Negative for diarrhea, nausea and vomiting.  Neurological:  Positive for numbness.  Psychiatric/Behavioral:  Positive for sleep disturbance.   All other systems reviewed and are negative.  PAST MEDICAL/SURGICAL HISTORY:  Past Medical History:  Diagnosis Date   A-fib [facility])    DCCV 2009, 2012, 2014, Nov 2016   Anxiety    Arthritis    Knees, wrist, ankles   Cancer ([place]) 07/2019   Kidney   Diabetes mellitus without complication ([place])    Dysrhythmia 1996   A- Fib   GERD (gastroesophageal reflux disease)    History of kidney stones    HX: anticoagulation    very remotely and briefly on Bradley Rodriguez aound one of his CV, 2014 on Eliquis had hematuria with renal calculi and stopped   Hyperlipidemia    patient denies this   Hypertension    IBS (irritable bowel syndrome)    Normal cardiac stress test  Normal nuclear stress test , 2001   Past Surgical History:  Procedure Laterality Date   BIOPSY  08/19/2020   Procedure: BIOPSY;  Surgeon: Harvel Quale, MD;  [facility];  Service: Gastroenterology;;   CARDIOVERSION N/A 10/15/2012   Procedure: CARDIOVERSION;  Surgeon: Larey Dresser, MD;  Location: [facility] ENDOSCOPY;  Service: Cardiovascular;  Laterality: N/A;   CARDIOVERSION N/A 11/26/2014   Procedure: CARDIOVERSION;  Surgeon: Arnoldo Lenis, MD;  Location: [facility];  Service: Endoscopy;  Laterality: N/A;   CARDIOVERSION N/A 11/15/2015   Procedure: CARDIOVERSION;  Surgeon: Jerline Pain, MD;  Location: [facility];  Service: Cardiovascular;  Laterality: N/A;   CARDIOVERSION N/A 08/07/2016   Procedure: CARDIOVERSION;  Surgeon: Herminio Commons, MD;  Location: [facility];  Service: Cardiovascular;  Laterality: N/A;   CARDIOVERSION N/A 09/11/2018   Procedure: CARDIOVERSION;  Surgeon: Arnoldo Lenis, MD;  Location: [facility];  Service: Endoscopy;  Laterality: N/A;   CATARACT EXTRACTION W/PHACO Right 01/18/2014   Procedure: CATARACT EXTRACTION PHACO AND INTRAOCULAR LENS PLACEMENT (IOC);  Surgeon: Tonny Branch, MD;  Location: [facility];  Service: Ophthalmology;  Laterality: Right;  CDE 9.95   COLONOSCOPY WITH PROPOFOL N/A 08/19/2020   Procedure: COLONOSCOPY WITH PROPOFOL;  Surgeon: Harvel Quale, MD;  [facility];  Service: Gastroenterology;  Laterality: N/A;  8:20   CYSTOSCOPY WITH LITHOLAPAXY N/A 10/06/2019   Procedure: CYSTOSCOPY WITH LASER LITHOLAPAXY;  Surgeon: Ceasar Mons, MD;  Location: [facility];  Service: Urology;  Laterality: N/A;   ESOPHAGOGASTRODUODENOSCOPY (EGD) WITH PROPOFOL N/A 08/19/2020   Procedure: ESOPHAGOGASTRODUODENOSCOPY (EGD) WITH PROPOFOL;  Surgeon: Harvel Quale, MD;  [facility];  Service: Gastroenterology;  Laterality: N/A;   EYE SURGERY Right    detached retina   FOOT SURGERY Left    KNEE ARTHROSCOPY Right    LAPAROSCOPIC RIGHT HEMI COLECTOMY N/A 09/30/2020   Procedure: LAPAROSCOPIC RIGHT HEMI COLECTOMY;  Surgeon: Ileana Roup, MD;  Location: [facility];  Service: General;  Laterality: N/A;   LITHOTRIPSY     for kidney stones 2002   POLYPECTOMY  08/19/2020   Procedure: POLYPECTOMY INTESTINAL;  Surgeon: Harvel Quale, MD;  [facility];  Service: Gastroenterology;;   POLYPECTOMY  08/19/2020   Procedure: POLYPECTOMY;  Surgeon:  Harvel Quale, MD;  [facility];  Service: Gastroenterology;;   ROBOT ASSISTED LAPAROSCOPIC NEPHRECTOMY Left 10/06/2019   Procedure: XI ROBOTIC ASSISTED LAPAROSCOPIC NEPHRECTOMY WITH ADRENALECTOMY;  Surgeon: Ceasar Mons, MD;  [facility];  Service: Urology;  Laterality: Left;   SUBMUCOSAL TATTOO INJECTION  08/19/2020   Procedure: SUBMUCOSAL TATTOO INJECTION;  Surgeon: Harvel Quale, MD;  [facility];  Service: Gastroenterology;;   TEE WITHOUT CARDIOVERSION N/A 11/26/2014   Procedure: TRANSESOPHAGEAL ECHOCARDIOGRAM (TEE) WITH PROPOFOL;  Surgeon: Arnoldo Lenis, MD;  [facility];  Service: Endoscopy;  Laterality: N/A;   TEE WITHOUT CARDIOVERSION N/A 11/15/2015   Procedure: TRANSESOPHAGEAL ECHOCARDIOGRAM (TEE);  Surgeon: Jerline Pain, MD;  Location: [facility];  Service: Cardiovascular;  Laterality: N/A;   TEE WITHOUT CARDIOVERSION N/A 09/11/2018   Procedure: TRANSESOPHAGEAL ECHOCARDIOGRAM (TEE) WITH PROPOFOL;  Surgeon: Arnoldo Lenis, MD;  [facility];  Service: Endoscopy;  Laterality: N/A;   VASECTOMY      SOCIAL HISTORY:  Social History   Socioeconomic History   Marital status: Married    Spouse name: Not on file   Number of children: Not on file   Years of  education: Not on file   Highest education level: Not on file  Occupational History   Not on file  Tobacco Use   Smoking status: Never   Smokeless tobacco: Never  Vaping Use   Vaping Use: Never used  Substance and Sexual Activity   Alcohol use: No    Alcohol/week: 0.0 standard drinks   Drug use: No   Sexual activity: Yes    Birth control/protection: Surgical  Other Topics Concern   Not on file  Social History Narrative   Not on file   Social Determinants of Health   Financial Resource Strain: Low Risk    Difficulty of Paying Living Expenses: Not hard at all  Food Insecurity: No Food Insecurity   Worried About Charity fundraiser in the  Last Year: Never true   Ran Out of Food in the Last Year: Never true  Transportation Needs: No Transportation Needs   Lack of Transportation (Medical): No   Lack of Transportation (Non-Medical): No  Physical Activity: Inactive   Days of Exercise per Week: 0 days   Minutes of Exercise per Session: 0 min  Stress: No Stress Concern Present   Feeling of Stress : Not at all  Social Connections: Moderately Integrated   Frequency of Communication with Friends and Family: More than three times a week   Frequency of Social Gatherings with Friends and Family: More than three times a week   Attends Religious Services: More than 4 times per year   Active Member of [employer] or Organizations: No   Attends Archivist Meetings: Never   Marital Status: Married  Human resources officer Violence: Not At Risk   Fear of Current or Ex-Partner: No   Emotionally Abused: No   Physically Abused: No   Sexually Abused: No    FAMILY HISTORY:  Family History  Problem Relation Age of Onset   Diabetes Mother    Hypertension Mother    Colon cancer Mother        dx. 30s   Diabetes Father    Colon cancer Father        dx. 6s   Lung cancer Maternal Grandmother        smoked   Bone cancer Maternal Grandfather        dx. 80s   Hypertension Other     CURRENT MEDICATIONS:  Current Outpatient Medications  Medication Sig Dispense Refill   amLODipine (NORVASC) 5 MG tablet TAKE 1 TABLET BY MOUTH EVERY DAY (Patient taking differently: Take 5 mg by mouth every evening.) 90 tablet 3   atorvastatin (LIPITOR) 20 MG tablet Take 20 mg by mouth every evening.      Bioflavonoid Products (ESTER C PO) Take 1,000 mg by mouth daily.     Calcium Citrate-Vitamin D (CITRACAL + D PO) Take 1 tablet by mouth daily.     capecitabine (XELODA) 500 MG tablet Take 4 tablets (2,000 mg total) by mouth 2 (two) times daily after a meal. Take for 14 days, then hold for 7 days. Repeat every 21 days. 112 tablet 3   cholecalciferol (VITAMIN  D3) 25 MCG (1000 UNIT) tablet Take 1,000 Units by mouth daily.     iron polysaccharides (NIFEREX) 150 MG capsule Take 150 mg by mouth 2 (two) times daily.     losartan (COZAAR) 100 MG tablet Take 100 mg by mouth every evening.      metoprolol tartrate (LOPRESSOR) 100 MG tablet TAKE 1 TABLET BY MOUTH TWICE A DAY (Patient taking differently:  Take 100 mg by mouth 2 (two) times daily.) 60 tablet 2   omeprazole (PRILOSEC) 40 MG capsule TAKE 1 CAPSULE BY MOUTH TWICE A DAY 180 capsule 1   OVER THE COUNTER MEDICATION Take 1 tablet by mouth 2 (two) times daily. Glucocil otc supplement     OXALIPLATIN IV Inject into the vein every 21 ( twenty-one) days.     prochlorperazine (COMPAZINE) 10 MG tablet Take 1 tablet (10 mg total) by mouth every 6 (six) hours as needed (Nausea or vomiting). (Patient not taking: No sig reported) 30 tablet 1   Semaglutide (OZEMPIC, 0.25 OR 0.5 MG/DOSE, [place]) Inject 0.5 mg into the skin every Monday.      Vitamin D, Ergocalciferol, (DRISDOL) 50000 UNITS CAPS capsule Take 50,000 Units by mouth every Wednesday.     zinc gluconate 50 MG tablet Take 50 mg by mouth daily.     No current facility-administered medications for this visit.    ALLERGIES:  Allergies  Allergen Reactions   Ace Inhibitors Cough   Lisinopril Cough   Xarelto [Rivaroxaban] Other (See Comments)    Heart out of rhythm    Quinine Derivatives Rash    PHYSICAL EXAM:  Performance status (ECOG): 1 - Symptomatic but completely ambulatory  There were no vitals filed for this visit. Wt Readings from Last 3 Encounters:  11/17/20 299 lb 3.2 oz (135.7 kg)  11/17/20 297 lb 3.2 oz (134.8 kg)  11/10/20 298 lb (135.2 kg)   Physical Exam Vitals reviewed.  Constitutional:      Appearance: Normal appearance. He is obese.  Cardiovascular:     Rate and Rhythm: Normal rate and regular rhythm.     Pulses: Normal pulses.     Heart sounds: Normal heart sounds.  Pulmonary:     Effort: Pulmonary effort is normal.      Breath sounds: Normal breath sounds.  Neurological:     General: No focal deficit present.     Mental Status: He is alert and oriented to person, place, and time.  Psychiatric:        Mood and Affect: Mood normal.        Behavior: Behavior normal.    LABORATORY DATA:  I have reviewed the labs as listed.  CBC Latest Ref Rng & Units 11/17/2020 11/10/2020 10/27/2020  WBC 4.0 - 10.5 K/uL 4.3 4.8 5.4  Hemoglobin 13.0 - 17.0 g/dL 11.8(L) 10.4(L) 11.2(L)  Hematocrit 39.0 - 52.0 % 37.4(L) 33.1(L) 37.3(L)  Platelets 150 - 400 K/uL 256 245 327   CMP Latest Ref Rng & Units 11/17/2020 11/10/2020 10/27/2020  Glucose 70 - 99 mg/dL 114(H) 99 124(H)  BUN 8 - 23 mg/dL 23 15 20   Creatinine 0.61 - 1.24 mg/dL 1.11 1.02 1.27(H)  Sodium 135 - 145 mmol/L 138 138 138  Potassium 3.5 - 5.1 mmol/L 4.2 3.6 4.5  Chloride 98 - 111 mmol/L 104 107 106  CO2 22 - 32 mmol/L 26 25 26   Calcium 8.9 - 10.3 mg/dL 9.2 8.7(L) 9.0  Total Protein 6.5 - 8.1 g/dL 7.5 6.9 7.5  Total Bilirubin 0.3 - 1.2 mg/dL 1.3(H) 2.0(H) 1.3(H)  Alkaline Phos 38 - 126 U/L 108 90 108  AST 15 - 41 U/L 20 22 20   ALT 0 - 44 U/L 22 25 24     DIAGNOSTIC IMAGING:  I have independently reviewed the scans and discussed with the patient. No results found.   ASSESSMENT:  1.  Right-sided colon cancer: - He had 3 prior colonoscopies, last one 2  years ago by Dr. Wonda Cheng in [place], with multiple polyps removed. - Colonoscopy by Dr. Jenetta Downer on 08/19/2020 showed fungating infiltrative ulcerated partially obstructing large mass in the proximal ascending colon.  Mass was partially circumferential.  7 sessile polyps found in the transverse colon, ascending colon and cecum. - Pathology consistent with adenocarcinoma of the biopsy of the mass.  Single fragment of at least intramucosal adenocarcinoma in the transverse colon polypectomy.  Pathology report has descending colon as adenocarcinoma. - CT CAP with contrast on 08/26/2020 shows circumferential lesion at  the hepatic flexure of the colon consistent with primary colorectal carcinoma with no pathologically enlarged lymph node metastasis.  No liver metastasis.  Status post left nephrectomy.  No evidence of thoracic metastasis. - Even though descending colon polypectomy was reported as adenocarcinoma, Dr. Jenetta Downer felt that it was mislabeled.  Mass was reported in the proximal ascending colon on colonoscopy and close to hepatic flexure on CT scan. - Right hemicolectomy on 09/30/2020 - Pathology PT3PN1B, 2/30 lymph nodes positive, 1 tumor deposit, margins negative, MSI stable no lymphovascular or perineural invasion, grade 2, no perforation. - Cycle 1 of [place] ox on 10/27/2020.  2.  Left kidney renal cell carcinoma: - Incidentally found on work-up for kidney stones. - Left nephrectomy by Dr. Gilford Rodriguez on 10/06/2019 with pathology showing clear-cell renal cell carcinoma, grade 2, 6 cm, margins negative.  1 benign lymph node.  PT1BPN0.  3.  Social/family history: - He is a disabled.  He worked at [employer] and a [employer].  He might have exposed to some chemicals at the later job.  No prior history of smoking. - Family history significant for mother with colon cancer, father with colon cancer and maternal grandfather with bone cancer.   PLAN:  1.  Stage III (PT3BPN1B right-sided colon cancer: - He has tolerated last cycle reasonably well.  He felt flulike symptoms lasting 3 to 4 days after cycle 2. - No watery diarrhea.  However had loose stools for 3 to 4 days.  He did not require any Imodium. - Reviewed labs from today which showed normal LFTs and electrolytes.  CBC shows white count 4.1 with normal ANC.  Last CEA was 1.8. - He will start Xeloda this evening.  He will proceed with oxaliplatin without any dose modification today. - RTC 3 weeks for follow-up with repeat labs and cycle 4.  2.  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  3.   Peripheral neuropathy: -Overall there is very slight worsening of numbness in the feet.  No neuropathic pains.  We will closely monitor.  4.  Iron deficiency state: - Ferritin is 24.  Percent saturation 12. - Continue iron tablet twice daily.   Orders placed this encounter:  No orders of the defined types were placed in this encounter.    Derek Jack, MD [facility] [phone]   I, Thana Ates, am acting as a scribe for Dr. Derek Jack.  I, Derek Jack MD, have reviewed the above documentation for accuracy and completeness, and I agree with the above.

## 2020-12-07 ENCOUNTER — Inpatient Hospital Stay (HOSPITAL_COMMUNITY): Payer: Medicare HMO

## 2020-12-07 ENCOUNTER — Other Ambulatory Visit: Payer: Self-pay

## 2020-12-07 ENCOUNTER — Inpatient Hospital Stay (HOSPITAL_COMMUNITY): Payer: Medicare HMO | Admitting: Hematology

## 2020-12-07 VITALS — BP 146/76 | HR 64 | Temp 97.5°F | Resp 18

## 2020-12-07 DIAGNOSIS — Z452 Encounter for adjustment and management of vascular access device: Secondary | ICD-10-CM

## 2020-12-07 DIAGNOSIS — E785 Hyperlipidemia, unspecified: Secondary | ICD-10-CM | POA: Diagnosis not present

## 2020-12-07 DIAGNOSIS — E119 Type 2 diabetes mellitus without complications: Secondary | ICD-10-CM | POA: Diagnosis not present

## 2020-12-07 DIAGNOSIS — C182 Malignant neoplasm of ascending colon: Secondary | ICD-10-CM

## 2020-12-07 DIAGNOSIS — D649 Anemia, unspecified: Secondary | ICD-10-CM

## 2020-12-07 DIAGNOSIS — G629 Polyneuropathy, unspecified: Secondary | ICD-10-CM | POA: Diagnosis not present

## 2020-12-07 DIAGNOSIS — C642 Malignant neoplasm of left kidney, except renal pelvis: Secondary | ICD-10-CM

## 2020-12-07 DIAGNOSIS — Z5111 Encounter for antineoplastic chemotherapy: Secondary | ICD-10-CM | POA: Diagnosis not present

## 2020-12-07 DIAGNOSIS — M129 Arthropathy, unspecified: Secondary | ICD-10-CM | POA: Diagnosis not present

## 2020-12-07 DIAGNOSIS — K219 Gastro-esophageal reflux disease without esophagitis: Secondary | ICD-10-CM | POA: Diagnosis not present

## 2020-12-07 DIAGNOSIS — I4891 Unspecified atrial fibrillation: Secondary | ICD-10-CM | POA: Diagnosis not present

## 2020-12-07 DIAGNOSIS — D509 Iron deficiency anemia, unspecified: Secondary | ICD-10-CM | POA: Diagnosis not present

## 2020-12-07 LAB — COMPREHENSIVE METABOLIC PANEL
ALT: 30 U/L (ref 0–44)
AST: 28 U/L (ref 15–41)
Albumin: 4 g/dL (ref 3.5–5.0)
Alkaline Phosphatase: 106 U/L (ref 38–126)
Anion gap: 8 (ref 5–15)
BUN: 17 mg/dL (ref 8–23)
CO2: 25 mmol/L (ref 22–32)
Calcium: 8.9 mg/dL (ref 8.9–10.3)
Chloride: 106 mmol/L (ref 98–111)
Creatinine, Ser: 1.1 mg/dL (ref 0.61–1.24)
GFR, Estimated: >60 mL/min (ref >60)
Glucose, Bld: 155 mg/dL — ABNORMAL HIGH (ref 70–99)
Potassium: 4 mmol/L (ref 3.5–5.1)
Sodium: 139 mmol/L (ref 135–145)
Total Bilirubin: 1.2 mg/dL (ref 0.3–1.2)
Total Protein: 7 g/dL (ref 6.5–8.1)

## 2020-12-07 LAB — IRON AND TIBC
Iron: 54 ug/dL (ref 45–182)
Saturation Ratios: 12 % — ABNORMAL LOW (ref 17.9–39.5)
TIBC: 459 ug/dL — ABNORMAL HIGH (ref 250–450)
UIBC: 405 ug/dL

## 2020-12-07 LAB — CBC WITH DIFFERENTIAL/PLATELET
Abs Immature Granulocytes: 0.02 10*3/uL (ref 0.00–0.07)
Basophils Absolute: 0 10*3/uL (ref 0.0–0.1)
Basophils Relative: 1 %
Eosinophils Absolute: 0.1 10*3/uL (ref 0.0–0.5)
Eosinophils Relative: 3 %
HCT: 36.2 % — ABNORMAL LOW (ref 39.0–52.0)
Hemoglobin: 11.6 g/dL — ABNORMAL LOW (ref 13.0–17.0)
Immature Granulocytes: 1 %
Lymphocytes Relative: 15 %
Lymphs Abs: 0.6 10*3/uL — ABNORMAL LOW (ref 0.7–4.0)
MCH: 27.4 pg (ref 26.0–34.0)
MCHC: 32 g/dL (ref 30.0–36.0)
MCV: 85.6 fL (ref 80.0–100.0)
Monocytes Absolute: 0.5 10*3/uL (ref 0.1–1.0)
Monocytes Relative: 11 %
Neutro Abs: 2.8 10*3/uL (ref 1.7–7.7)
Neutrophils Relative %: 69 %
Platelets: 236 10*3/uL (ref 150–400)
RBC: 4.23 MIL/uL (ref 4.22–5.81)
RDW: 21.4 % — ABNORMAL HIGH (ref 11.5–15.5)
WBC: 4.1 10*3/uL (ref 4.0–10.5)
nRBC: 0 % (ref 0.0–0.2)

## 2020-12-07 LAB — MAGNESIUM: Magnesium: 1.7 mg/dL (ref 1.7–2.4)

## 2020-12-07 LAB — FERRITIN: Ferritin: 24 ng/mL (ref 24–336)

## 2020-12-07 MED ORDER — SODIUM CHLORIDE 0.9% FLUSH
10.0000 mL | INTRAVENOUS | Status: DC | PRN
  Administered 2020-12-07: 10 mL

## 2020-12-07 MED ORDER — HEPARIN SOD (PORK) LOCK FLUSH 100 UNIT/ML IV SOLN: 500.0000 [IU] | Freq: Once | INTRAVENOUS | Status: DC | PRN

## 2020-12-07 MED ORDER — SODIUM CHLORIDE 0.9 % IV SOLN
10.0000 mg | Freq: Once | INTRAVENOUS | Status: AC
  Administered 2020-12-07: 10 mg via INTRAVENOUS
  Filled 2020-12-07: qty 10

## 2020-12-07 MED ORDER — OXALIPLATIN CHEMO INJECTION 100 MG/20ML
130.0000 mg/m2 | Freq: Once | INTRAVENOUS | Status: AC
  Administered 2020-12-07: 360 mg via INTRAVENOUS
  Filled 2020-12-07: qty 12

## 2020-12-07 MED ORDER — PALONOSETRON HCL INJECTION 0.25 MG/5ML
0.2500 mg | Freq: Once | INTRAVENOUS | Status: AC
  Administered 2020-12-07: 0.25 mg via INTRAVENOUS
  Filled 2020-12-07: qty 5

## 2020-12-07 MED ORDER — HEPARIN SOD (PORK) LOCK FLUSH 100 UNIT/ML IV SOLN
250.0000 [IU] | Freq: Once | INTRAVENOUS | Status: AC
  Administered 2020-12-07: 250 [IU] via INTRAVENOUS

## 2020-12-07 MED ORDER — DEXTROSE 5 % IV SOLN: Freq: Once | INTRAVENOUS | Status: AC

## 2020-12-07 NOTE — Patient Instructions (Signed)
[facility]  Discharge Instructions: Thank you for choosing [facility] to provide your oncology and hematology care.  If you have a lab appointment with the [facility], please come in thru the Main Entrance and check in at the main information desk.  Wear comfortable clothing and clothing appropriate for easy access to any Portacath or PICC line.   We strive to give you quality time with your provider. You may need to reschedule your appointment if you arrive late (15 or more minutes).  Arriving late affects you and other patients whose appointments are after yours.  Also, if you miss three or more appointments without notifying the office, you may be dismissed from the clinic at the provider's discretion.      For prescription refill requests, have your pharmacy contact our office and allow 72 hours for refills to be completed.    Today you received the following chemotherapy and/or immunotherapy agents oxaliplatin Please follow up as scheduled      To help prevent nausea and vomiting after your treatment, we encourage you to take your nausea medication as directed.  BELOW ARE SYMPTOMS THAT SHOULD BE REPORTED IMMEDIATELY: *FEVER GREATER THAN 100.4 F (38 C) OR HIGHER *CHILLS OR SWEATING *NAUSEA AND VOMITING THAT IS NOT CONTROLLED WITH YOUR NAUSEA MEDICATION *UNUSUAL SHORTNESS OF BREATH *UNUSUAL BRUISING OR BLEEDING *URINARY PROBLEMS (pain or burning when urinating, or frequent urination) *BOWEL PROBLEMS (unusual diarrhea, constipation, pain near the anus) TENDERNESS IN MOUTH AND THROAT WITH OR WITHOUT PRESENCE OF ULCERS (sore throat, sores in mouth, or a toothache) UNUSUAL RASH, SWELLING OR PAIN  UNUSUAL VAGINAL DISCHARGE OR ITCHING   Items with * indicate a potential emergency and should be followed up as soon as possible or go to the Emergency Department if any problems should occur.  Please show the CHEMOTHERAPY ALERT CARD or IMMUNOTHERAPY ALERT CARD at  check-in to the Emergency Department and triage nurse.  Should you have questions after your visit or need to cancel or reschedule your appointment, please contact [facility] [phone]  and follow the prompts.  Office hours are 8:00 a.m. to 4:30 p.m. Monday - Friday. Please note that voicemails left after 4:00 p.m. may not be returned until the following business day.  We are closed weekends and major holidays. You have access to a nurse at all times for urgent questions. Please call the main number to the clinic [phone] and follow the prompts.  For any non-urgent questions, you may also contact your provider using MyChart. We now offer e-Visits for anyone 1 and older to request care online for non-urgent symptoms. For details visit [email or web address].   Also download the MyChart app! Go to the app store, search "MyChart", open the app, select [facility], and log in with your MyChart username and password.  Due to Covid, a mask is required upon entering the hospital/clinic. If you do not have a mask, one will be given to you upon arrival. For doctor visits, patients may have 1 support person aged 24 or older with them. For treatment visits, patients cannot have anyone with them due to current Covid guidelines and our immunocompromised population.   Oxaliplatin Injection What is this medication? OXALIPLATIN (ox AL i PLA tin) is a chemotherapy drug. It targets fast dividing cells, like cancer cells, and causes these cells to die. This medicine is used to treat cancers of the colon and rectum, and many other cancers. This medicine may be used for other  purposes; ask your health care provider or pharmacist if you have questions. COMMON BRAND NAME(S): Eloxatin What should I tell my care team before I take this medication? They need to know if you have any of these conditions: heart disease history of irregular heartbeat liver disease low blood counts, like white cells,  platelets, or red blood cells lung or breathing disease, like asthma take medicines that treat or prevent blood clots tingling of the fingers or toes, or other nerve disorder an unusual or allergic reaction to oxaliplatin, other chemotherapy, other medicines, foods, dyes, or preservatives pregnant or trying to get pregnant breast-feeding How should I use this medication? This drug is given as an infusion into a vein. It is administered in a hospital or clinic by a specially trained health care professional. Talk to your pediatrician regarding the use of this medicine in children. Special care may be needed. Overdosage: If you think you have taken too much of this medicine contact a poison control center or emergency room at once. NOTE: This medicine is only for you. Do not share this medicine with others. What if I miss a dose? It is important not to miss a dose. Call your doctor or health care professional if you are unable to keep an appointment. What may interact with this medication? Do not take this medicine with any of the following medications: cisapride dronedarone pimozide thioridazine This medicine may also interact with the following medications: aspirin and aspirin-like medicines certain medicines that treat or prevent blood clots like warfarin, apixaban, dabigatran, and rivaroxaban cisplatin cyclosporine diuretics medicines for infection like acyclovir, adefovir, amphotericin B, bacitracin, cidofovir, foscarnet, ganciclovir, gentamicin, pentamidine, vancomycin NSAIDs, medicines for pain and inflammation, like ibuprofen or naproxen other medicines that prolong the QT interval (an abnormal heart rhythm) pamidronate zoledronic acid This list may not describe all possible interactions. Give your health care provider a list of all the medicines, herbs, non-prescription drugs, or dietary supplements you use. Also tell them if you smoke, drink alcohol, or use illegal drugs. Some  items may interact with your medicine. What should I watch for while using this medication? Your condition will be monitored carefully while you are receiving this medicine. You may need blood work done while you are taking this medicine. This medicine may make you feel generally unwell. This is not uncommon as chemotherapy can affect healthy cells as well as cancer cells. Report any side effects. Continue your course of treatment even though you feel ill unless your healthcare professional tells you to stop. This medicine can make you more sensitive to cold. Do not drink cold drinks or use ice. Cover exposed skin before coming in contact with cold temperatures or cold objects. When out in cold weather wear warm clothing and cover your mouth and nose to warm the air that goes into your lungs. Tell your doctor if you get sensitive to the cold. Do not become pregnant while taking this medicine or for 9 months after stopping it. Women should inform their health care professional if they wish to become pregnant or think they might be pregnant. Men should not father a child while taking this medicine and for 6 months after stopping it. There is potential for serious side effects to an unborn child. Talk to your health care professional for more information. Do not breast-feed a child while taking this medicine or for 3 months after stopping it. This medicine has caused ovarian failure in some women. This medicine may make it more difficult to  get pregnant. Talk to your health care professional if you are concerned about your fertility. This medicine has caused decreased sperm counts in some men. This may make it more difficult to father a child. Talk to your health care professional if you are concerned about your fertility. This medicine may increase your risk of getting an infection. Call your health care professional for advice if you get a fever, chills, or sore throat, or other symptoms of a cold or flu.  Do not treat yourself. Try to avoid being around people who are sick. Avoid taking medicines that contain aspirin, acetaminophen, ibuprofen, naproxen, or ketoprofen unless instructed by your health care professional. These medicines may hide a fever. Be careful brushing or flossing your teeth or using a toothpick because you may get an infection or bleed more easily. If you have any dental work done, tell your dentist you are receiving this medicine. What side effects may I notice from receiving this medication? Side effects that you should report to your doctor or health care professional as soon as possible: allergic reactions like skin rash, itching or hives, swelling of the face, lips, or tongue breathing problems cough low blood counts - this medicine may decrease the number of white blood cells, red blood cells, and platelets. You may be at increased risk for infections and bleeding nausea, vomiting pain, redness, or irritation at site where injected pain, tingling, numbness in the hands or feet signs and symptoms of bleeding such as bloody or black, tarry stools; red or dark brown urine; spitting up blood or brown material that looks like coffee grounds; red spots on the skin; unusual bruising or bleeding from the eyes, gums, or nose signs and symptoms of a dangerous change in heartbeat or heart rhythm like chest pain; dizziness; fast, irregular heartbeat; palpitations; feeling faint or lightheaded; falls signs and symptoms of infection like fever; chills; cough; sore throat; pain or trouble passing urine signs and symptoms of liver injury like dark yellow or brown urine; general ill feeling or flu-like symptoms; light-colored stools; loss of appetite; nausea; right upper belly pain; unusually weak or tired; yellowing of the eyes or skin signs and symptoms of low red blood cells or anemia such as unusually weak or tired; feeling faint or lightheaded; falls signs and symptoms of muscle injury  like dark urine; trouble passing urine or change in the amount of urine; unusually weak or tired; muscle pain; back pain Side effects that usually do not require medical attention (report to your doctor or health care professional if they continue or are bothersome): changes in taste diarrhea gas hair loss loss of appetite mouth sores This list may not describe all possible side effects. Call your doctor for medical advice about side effects. You may report side effects to FDA at 1-800-FDA-1088. Where should I keep my medication? This drug is given in a hospital or clinic and will not be stored at home. NOTE: This sheet is a summary. It may not cover all possible information. If you have questions about this medicine, talk to your doctor, pharmacist, or health care provider.  2022 [employer]/Gold Standard (2020-09-27 00:00:00)

## 2020-12-07 NOTE — Progress Notes (Signed)
Bradley Rodriguez presented for PICC flush/blood draw/dressing change.  See IV assessment in docflowsheets for PICC details.  PICC located right arm.  Good blood return present.  PICC flushed with 74ml NS.  Bradley Rodriguez tolerated procedure well and without incident.

## 2020-12-07 NOTE — Progress Notes (Signed)
Pt here for D1C3 of oxaliplatin. Vital signs WNL for treatment today. Okay for treatment today per Dr Raliegh Ip.    Tolerated treatment well today without incidence.  Stable during and after treatment.  Vital signs stable prior to discharge.  AVS reviewed. Discharged in stable condition ambulatory.

## 2020-12-07 NOTE — Patient Instructions (Signed)
[facility] at [facility] Discharge Instructions  You were seen and examined today by Dr. Delton Coombes. We will proceed with your third cycle of treatment today. Return as scheduled in 3 weeks for cycle 4 (last one!) of treatment.    Thank you for choosing [facility] at [facility] to provide your oncology and hematology care.  To afford each patient quality time with our provider, please arrive at least 15 minutes before your scheduled appointment time.   If you have a lab appointment with the [facility] please come in thru the Main Entrance and check in at the main information desk.  You need to re-schedule your appointment should you arrive 10 or more minutes late.  We strive to give you quality time with our providers, and arriving late affects you and other patients whose appointments are after yours.  Also, if you no show three or more times for appointments you may be dismissed from the clinic at the providers discretion.     Again, thank you for choosing [facility].  Our hope is that these requests will decrease the amount of time that you wait before being seen by our physicians.       _____________________________________________________________  Should you have questions after your visit to [facility], please contact our office at [phone] and follow the prompts.  Our office hours are 8:00 a.m. and 4:30 p.m. Monday - Friday.  Please note that voicemails left after 4:00 p.m. may not be returned until the following business day.  We are closed weekends and major holidays.  You do have access to a nurse 24-7, just call the main number to the clinic [phone] and do not press any options, hold on the line and a nurse will answer the phone.    For prescription refill requests, have your pharmacy contact our office and allow 72 hours.    Due to Covid, you will need to wear a mask upon entering the hospital.  If you do not have a mask, a mask will be given to you at the Main Entrance upon arrival. For doctor visits, patients may have 1 support person age 84 or older with them. For treatment visits, patients can not have anyone with them due to social distancing guidelines and our immunocompromised population.

## 2020-12-07 NOTE — Progress Notes (Signed)
Patient has been examined, vital signs and labs have been reviewed by Dr. Katragadda. ANC, Creatinine, LFTs, hemoglobin, and platelets are within treatment parameters per Dr. Katragadda. Patient may proceed with treatment per M.D.   

## 2020-12-08 ENCOUNTER — Other Ambulatory Visit (HOSPITAL_COMMUNITY): Payer: Medicare HMO

## 2020-12-08 ENCOUNTER — Ambulatory Visit (HOSPITAL_COMMUNITY): Payer: Medicare HMO

## 2020-12-08 LAB — CEA: CEA: 1.7 ng/mL (ref 0.0–4.7)

## 2020-12-11 ENCOUNTER — Emergency Department (HOSPITAL_COMMUNITY): Payer: Medicare HMO

## 2020-12-11 ENCOUNTER — Other Ambulatory Visit: Payer: Self-pay

## 2020-12-11 ENCOUNTER — Emergency Department (HOSPITAL_COMMUNITY)
Admission: EM | Admit: 2020-12-11 | Discharge: 2020-12-11 | Disposition: A | Discharge status: Home / Self Care | Payer: Medicare HMO | Attending: Emergency Medicine | Admitting: Emergency Medicine

## 2020-12-11 ENCOUNTER — Encounter (HOSPITAL_COMMUNITY): Payer: Self-pay

## 2020-12-11 DIAGNOSIS — Z0389 Encounter for observation for other suspected diseases and conditions ruled out: Secondary | ICD-10-CM | POA: Diagnosis not present

## 2020-12-11 DIAGNOSIS — Z7901 Long term (current) use of anticoagulants: Secondary | ICD-10-CM | POA: Insufficient documentation

## 2020-12-11 DIAGNOSIS — I4891 Unspecified atrial fibrillation: Secondary | ICD-10-CM | POA: Diagnosis not present

## 2020-12-11 DIAGNOSIS — I10 Essential (primary) hypertension: Secondary | ICD-10-CM | POA: Insufficient documentation

## 2020-12-11 DIAGNOSIS — Z79899 Other long term (current) drug therapy: Secondary | ICD-10-CM | POA: Insufficient documentation

## 2020-12-11 DIAGNOSIS — Z452 Encounter for adjustment and management of vascular access device: Secondary | ICD-10-CM | POA: Diagnosis not present

## 2020-12-11 DIAGNOSIS — Z85038 Personal history of other malignant neoplasm of large intestine: Secondary | ICD-10-CM | POA: Diagnosis not present

## 2020-12-11 DIAGNOSIS — E119 Type 2 diabetes mellitus without complications: Secondary | ICD-10-CM | POA: Diagnosis not present

## 2020-12-11 DIAGNOSIS — R002 Palpitations: Secondary | ICD-10-CM | POA: Diagnosis present

## 2020-12-11 HISTORY — DX: Malignant neoplasm of colon, unspecified: C18.9

## 2020-12-11 LAB — CBC WITH DIFFERENTIAL/PLATELET
Abs Immature Granulocytes: 0.01 10*3/uL (ref 0.00–0.07)
Basophils Absolute: 0 10*3/uL (ref 0.0–0.1)
Basophils Relative: 1 %
Eosinophils Absolute: 0.1 10*3/uL (ref 0.0–0.5)
Eosinophils Relative: 3 %
HCT: 38.1 % — ABNORMAL LOW (ref 39.0–52.0)
Hemoglobin: 12.2 g/dL — ABNORMAL LOW (ref 13.0–17.0)
Immature Granulocytes: 0 %
Lymphocytes Relative: 21 %
Lymphs Abs: 0.6 10*3/uL — ABNORMAL LOW (ref 0.7–4.0)
MCH: 26.9 pg (ref 26.0–34.0)
MCHC: 32 g/dL (ref 30.0–36.0)
MCV: 84.1 fL (ref 80.0–100.0)
Monocytes Absolute: 0.6 10*3/uL (ref 0.1–1.0)
Monocytes Relative: 20 %
Neutro Abs: 1.6 10*3/uL — ABNORMAL LOW (ref 1.7–7.7)
Neutrophils Relative %: 55 %
Platelets: 169 10*3/uL (ref 150–400)
RBC: 4.53 MIL/uL (ref 4.22–5.81)
RDW: 20.9 % — ABNORMAL HIGH (ref 11.5–15.5)
WBC: 2.9 10*3/uL — ABNORMAL LOW (ref 4.0–10.5)
nRBC: 0 % (ref 0.0–0.2)

## 2020-12-11 LAB — COMPREHENSIVE METABOLIC PANEL
ALT: 31 U/L (ref 0–44)
AST: 33 U/L (ref 15–41)
Albumin: 4.1 g/dL (ref 3.5–5.0)
Alkaline Phosphatase: 104 U/L (ref 38–126)
Anion gap: 8 (ref 5–15)
BUN: 22 mg/dL (ref 8–23)
CO2: 25 mmol/L (ref 22–32)
Calcium: 9.1 mg/dL (ref 8.9–10.3)
Chloride: 104 mmol/L (ref 98–111)
Creatinine, Ser: 1.36 mg/dL — ABNORMAL HIGH (ref 0.61–1.24)
GFR, Estimated: 59 mL/min — ABNORMAL LOW (ref >60)
Glucose, Bld: 118 mg/dL — ABNORMAL HIGH (ref 70–99)
Potassium: 4.2 mmol/L (ref 3.5–5.1)
Sodium: 137 mmol/L (ref 135–145)
Total Bilirubin: 3.6 mg/dL — ABNORMAL HIGH (ref 0.3–1.2)
Total Protein: 7.2 g/dL (ref 6.5–8.1)

## 2020-12-11 LAB — TROPONIN I (HIGH SENSITIVITY)
Troponin I (High Sensitivity): 5 ng/L (ref <18)
Troponin I (High Sensitivity): 5 ng/L (ref <18)

## 2020-12-11 LAB — PROTIME-INR
INR: 1 (ref 0.8–1.2)
Prothrombin Time: 12.6 seconds (ref 11.4–15.2)

## 2020-12-11 LAB — MAGNESIUM: Magnesium: 1.9 mg/dL (ref 1.7–2.4)

## 2020-12-11 MED ORDER — WARFARIN - PHYSICIAN DOSING INPATIENT: Freq: Every day | Status: DC

## 2020-12-11 MED ORDER — SODIUM CHLORIDE 0.9 % IV BOLUS
1000.0000 mL | Freq: Once | INTRAVENOUS | Status: AC
  Administered 2020-12-11: 1000 mL via INTRAVENOUS

## 2020-12-11 MED ORDER — WARFARIN SODIUM 5 MG PO TABS: 5.0000 mg | ORAL_TABLET | Freq: Every day | ORAL | 0 refills | Status: DC

## 2020-12-11 MED ORDER — WARFARIN SODIUM 5 MG PO TABS
5.0000 mg | ORAL_TABLET | Freq: Once | ORAL | Status: AC
  Administered 2020-12-11: 5 mg via ORAL
  Filled 2020-12-11: qty 1

## 2020-12-11 NOTE — ED Triage Notes (Signed)
Pt from home with c/o fast HR.  Pt has hx of afib and is not on blood thinners.  Pt is alert and oriented.  Pt feels like he does when he was in abfib before.  Pt reports that he thinks he is dehydrated due to not being able to drink fluids due to chemo.  Hx of colon cancer.

## 2020-12-11 NOTE — ED Provider Notes (Signed)
[facility] EMERGENCY DEPARTMENT Provider Note   CSN: [id number] Arrival date & time: 12/11/20  0930     History Chief Complaint  Patient presents with   Palpitations    Bradley Rodriguez is a 60 y.o. male.  HPI  60 year old male with past medical history of paroxysmal A. fib status post multiple cardioversions, currently not on anticoagulation, colon cancer on active chemo, HTN, HLD presents the emergency department with concern for fast/irregular heart rate.  Dr. Harl Bowie is his cardiologist.  Patient states that he feels this only on a chronic basis however this morning he feels like it is been more persistent.  His pulse ox at home heart rates ranging from the 80s to 120s.  He had some mild left-sided chest discomfort earlier today but that is completely resolved.  No associated shortness of breath, cough, lower extremity swelling.  Patient states he was trialed on Xarelto however had a "bad allergic reaction" he is currently not on anticoagulation by choice.   Past Medical History:  Diagnosis Date   A-fib [facility])    DCCV 2009, 2012, 2014, Nov 2016   Anxiety    Arthritis    Knees, wrist, ankles   Cancer ([place]) 07/2019   Kidney   Colon cancer ([place])    Diabetes mellitus without complication ([place])    Dysrhythmia 1996   A- Fib   GERD (gastroesophageal reflux disease)    History of kidney stones    HX: anticoagulation    very remotely and briefly on COumadin aound one of his CV, 2014 on Eliquis had hematuria with renal calculi and stopped   Hyperlipidemia    patient denies this   Hypertension    IBS (irritable bowel syndrome)    Normal cardiac stress test    Normal nuclear stress test , 2001    Patient Active Problem List   Diagnosis Date Noted   Genetic testing 25/95/6387   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   Colon cancer ([place]) 09/30/2020   Family history of colon cancer 09/29/2020   Renal cell cancer, left ([place]) 09/29/2020   Cancer of ascending colon  ([place]) 09/01/2020   Iron deficiency anemia 08/01/2020   Left renal mass 10/06/2019   Ejection fraction    Normal cardiac stress test    HX: anticoagulation    S/P vasectomy    A-fib ([place])    Hyperlipidemia    Anxiety    IBS (irritable bowel syndrome)     Past Surgical History:  Procedure Laterality Date   BIOPSY  08/19/2020   Procedure: BIOPSY;  Surgeon: Harvel Quale, MD;  [facility];  Service: Gastroenterology;;   CARDIOVERSION N/A 10/15/2012   Procedure: CARDIOVERSION;  Surgeon: Larey Dresser, MD;  Location: [place];  Service: Cardiovascular;  Laterality: N/A;   CARDIOVERSION N/A 11/26/2014   Procedure: CARDIOVERSION;  Surgeon: Arnoldo Lenis, MD;  Location: [facility];  Service: Endoscopy;  Laterality: N/A;   CARDIOVERSION N/A 11/15/2015   Procedure: CARDIOVERSION;  Surgeon: Jerline Pain, MD;  Location: [place];  Service: Cardiovascular;  Laterality: N/A;   CARDIOVERSION N/A 08/07/2016   Procedure: CARDIOVERSION;  Surgeon: Herminio Commons, MD;  Location: [facility];  Service: Cardiovascular;  Laterality: N/A;   CARDIOVERSION N/A 09/11/2018   Procedure: CARDIOVERSION;  Surgeon: Arnoldo Lenis, MD;  Location[facility];  Service: Endoscopy;  Laterality: N/A;   CATARACT EXTRACTION W/PHACO Right 01/18/2014   Procedure: CATARACT EXTRACTION PHACO AND INTRAOCULAR LENS PLACEMENT (IOC);  Surgeon:  Tonny Branch, MD;  Location: [facility];  Service: Ophthalmology;  Laterality: Right;  CDE 9.95   COLONOSCOPY WITH PROPOFOL N/A 08/19/2020   Procedure: COLONOSCOPY WITH PROPOFOL;  Surgeon: Harvel Quale, MD;  [facility];  Service: Gastroenterology;  Laterality: N/A;  8:20   CYSTOSCOPY WITH LITHOLAPAXY N/A 10/06/2019   Procedure: CYSTOSCOPY WITH LASER LITHOLAPAXY;  Surgeon: Ceasar Mons, MD;  [facility];  Service: Urology;  Laterality: N/A;   ESOPHAGOGASTRODUODENOSCOPY (EGD) WITH PROPOFOL N/A 08/19/2020   Procedure:  ESOPHAGOGASTRODUODENOSCOPY (EGD) WITH PROPOFOL;  Surgeon: Harvel Quale, MD;  [facility];  Service: Gastroenterology;  Laterality: N/A;   EYE SURGERY Right    detached retina   FOOT SURGERY Left    KNEE ARTHROSCOPY Right    LAPAROSCOPIC RIGHT HEMI COLECTOMY N/A 09/30/2020   Procedure: LAPAROSCOPIC RIGHT HEMI COLECTOMY;  Surgeon: Ileana Roup, MD;  [facility];  Service: General;  Laterality: N/A;   LITHOTRIPSY     for kidney stones 2002   POLYPECTOMY  08/19/2020   Procedure: POLYPECTOMY INTESTINAL;  Surgeon: Harvel Quale, MD;  [facility];  Service: Gastroenterology;;   POLYPECTOMY  08/19/2020   Procedure: POLYPECTOMY;  Surgeon: Harvel Quale, MD;  [facility];  Service: Gastroenterology;;   ROBOT ASSISTED LAPAROSCOPIC NEPHRECTOMY Left 10/06/2019   Procedure: XI ROBOTIC ASSISTED LAPAROSCOPIC NEPHRECTOMY WITH ADRENALECTOMY;  Surgeon: Ceasar Mons, MD;  [facility];  Service: Urology;  Laterality: Left;   SUBMUCOSAL TATTOO INJECTION  08/19/2020   Procedure: SUBMUCOSAL TATTOO INJECTION;  Surgeon: Harvel Quale, MD;  [facility];  Service: Gastroenterology;;   TEE WITHOUT CARDIOVERSION N/A 11/26/2014   Procedure: TRANSESOPHAGEAL ECHOCARDIOGRAM (TEE) WITH PROPOFOL;  Surgeon: Arnoldo Lenis, MD;  [facility];  Service: Endoscopy;  Laterality: N/A;   TEE WITHOUT CARDIOVERSION N/A 11/15/2015   Procedure: TRANSESOPHAGEAL ECHOCARDIOGRAM (TEE);  Surgeon: Jerline Pain, MD;  Location: [place];  Service: Cardiovascular;  Laterality: N/A;   TEE WITHOUT CARDIOVERSION N/A 09/11/2018   Procedure: TRANSESOPHAGEAL ECHOCARDIOGRAM (TEE) WITH PROPOFOL;  Surgeon: Arnoldo Lenis, MD;  [facility];  Service: Endoscopy;  Laterality: N/A;   VASECTOMY         Family History  Problem Relation Age of Onset   Diabetes Mother    Hypertension Mother    Colon cancer Mother         dx. 65s   Diabetes Father    Colon cancer Father        dx. 59s   Lung cancer Maternal Grandmother        smoked   Bone cancer Maternal Grandfather        dx. 80s   Hypertension Other     Social History   Tobacco Use   Smoking status: Never   Smokeless tobacco: Never  Vaping Use   Vaping Use: Never used  Substance Use Topics   Alcohol use: No    Alcohol/week: 0.0 standard drinks   Drug use: No    Home Medications Prior to Admission medications   Medication Sig Start Date End Date Taking? Authorizing Provider  amLODipine (NORVASC) 5 MG tablet TAKE 1 TABLET BY MOUTH EVERY DAY Patient taking differently: Take 5 mg by mouth every evening. 04/12/20   Arnoldo Lenis, MD  atorvastatin (LIPITOR) 20 MG tablet Take 20 mg by mouth every evening.     [provider]  Bioflavonoid Products (ESTER C PO) Take 1,000 mg by  mouth daily.    [provider]  Calcium Citrate-Vitamin D (CITRACAL + D PO) Take 1 tablet by mouth daily.    [provider]  capecitabine (XELODA) 500 MG tablet Take 4 tablets (2,000 mg total) by mouth 2 (two) times daily after a meal. Take for 14 days, then hold for 7 days. Repeat every 21 days. 10/21/20   Derek Jack, MD  cholecalciferol (VITAMIN D3) 25 MCG (1000 UNIT) tablet Take 1,000 Units by mouth daily.    [provider]  iron polysaccharides (NIFEREX) 150 MG capsule Take 150 mg by mouth 2 (two) times daily.    [provider]  losartan (COZAAR) 100 MG tablet Take 100 mg by mouth every evening.     [provider]  metoprolol tartrate (LOPRESSOR) 100 MG tablet TAKE 1 TABLET BY MOUTH TWICE A DAY Patient taking differently: Take 100 mg by mouth 2 (two) times daily. 12/18/17   Herminio Commons, MD  omeprazole (PRILOSEC) 40 MG capsule TAKE 1 CAPSULE BY MOUTH TWICE A DAY 09/27/20   [place] Morita, Quillian Quince, MD  OVER THE COUNTER MEDICATION Take 1 tablet by mouth 2 (two) times daily. Glucocil otc  supplement    [provider]  OXALIPLATIN IV Inject into the vein every 21 ( twenty-one) days. 10/27/20   [provider]  prochlorperazine (COMPAZINE) 10 MG tablet Take 1 tablet (10 mg total) by mouth every 6 (six) hours as needed (Nausea or vomiting). 10/24/20   Derek Jack, MD  Semaglutide (OZEMPIC, 0.25 OR 0.5 MG/DOSE, [place]) Inject 0.5 mg into the skin every Monday.     [provider]  Vitamin D, Ergocalciferol, (DRISDOL) 50000 UNITS CAPS capsule Take 50,000 Units by mouth every Wednesday. 10/27/14   [provider]  zinc gluconate 50 MG tablet Take 50 mg by mouth daily.    [provider]    Allergies    Ace inhibitors, Lisinopril, Xarelto [rivaroxaban], and Quinine derivatives  Review of Systems   Review of Systems  Constitutional:  Negative for chills, fatigue and fever.  HENT:  Negative for congestion.   Eyes:  Negative for visual disturbance.  Respiratory:  Negative for chest tightness and shortness of breath.   Cardiovascular:  Positive for chest pain and palpitations. Negative for leg swelling.  Gastrointestinal:  Negative for abdominal pain, diarrhea and vomiting.  Genitourinary:  Negative for dysuria and flank pain.  Musculoskeletal:  Negative for back pain.  Skin:  Negative for rash.  Neurological:  Negative for headaches.   Physical Exam Updated Vital Signs BP 115/77   Pulse 88   Temp 98.2 F (36.8 C)   Resp 18   Ht 6' 8"  (2.032 m)   Wt 124.7 kg   SpO2 98%   BMI 30.21 kg/m   Physical Exam Vitals and nursing note reviewed.  Constitutional:      General: He is not in acute distress.    Appearance: Normal appearance. He is not diaphoretic.  HENT:     Head: Normocephalic.     Mouth/Throat:     Mouth: Mucous membranes are moist.  Cardiovascular:     Rate and Rhythm: Normal rate. Rhythm irregular.  Pulmonary:     Effort: Pulmonary effort is normal. No respiratory distress.  Abdominal:     Palpations:  Abdomen is soft.     Tenderness: There is no abdominal tenderness.  Musculoskeletal:        General: No swelling.  Skin:    General: Skin is warm.  Neurological:     Mental Status: He is alert and oriented to person, place, and time. Mental status is at baseline.  Psychiatric:        Mood and Affect: Mood normal.    ED Results / Procedures / Treatments   Labs (all labs ordered are listed, but only abnormal results are displayed) Labs Reviewed  CBC WITH DIFFERENTIAL/PLATELET - Abnormal; Notable for the following components:      Result Value   WBC 2.9 (*)    Hemoglobin 12.2 (*)    HCT 38.1 (*)    RDW 20.9 (*)    Neutro Abs 1.6 (*)    Lymphs Abs 0.6 (*)    All other components within normal limits  COMPREHENSIVE METABOLIC PANEL - Abnormal; Notable for the following components:   Glucose, Bld 118 (*)    Creatinine, Ser 1.36 (*)    Total Bilirubin 3.6 (*)    GFR, Estimated 59 (*)    All other components within normal limits  MAGNESIUM  TROPONIN I (HIGH SENSITIVITY)  TROPONIN I (HIGH SENSITIVITY)    EKG None  Radiology DG Chest Port 1 View  Result Date: 12/11/2020 CLINICAL DATA:  Possible AFib episode. EXAM: PORTABLE CHEST 1 VIEW COMPARISON:  Chest radiograph 02/01/2020 FINDINGS: Right upper extremity central venous catheter tip projects at the cavoatrial junction. Stable cardiomediastinal contours. The lungs are clear. No pneumothorax or large pleural effusion. No acute finding in the visualized skeleton. IMPRESSION: No acute cardiopulmonary finding. Electronically Signed   By: Audie Pinto M.D.   On: 12/11/2020 10:44    Procedures Procedures   Medications Ordered in ED Medications  sodium chloride 0.9 % bolus 1,000 mL (0 mLs Intravenous Stopped 12/11/20 1130)    ED Course  I have reviewed the triage vital signs and the nursing notes.  Pertinent labs & imaging results that were available during my care of the patient were reviewed by me and considered in my  medical decision making (see chart for details).    MDM Rules/Calculators/A&P                           60 year old male presents emergency department concern for fast and irregular heartbeat.  Heart rate is normal but it is irregular on arrival.  EKG shows atrial fibrillation.  He has had paroxysmal episodes in the past but he seems to be persistently in A. fib at this time.  Blood pressure is stable, no active chest pain.  Blood work is reassuring, electrolytes are normal, magnesium is normal, troponin is negative with no delta  Patient is currently not anticoagulated.  He has not symptomatic or unstable, not a good candidate for cardioversion here in the department.  Spoke with his cardiologist Dr. Harl Bowie who recommend starting him on anticoagulation having him follow-up as an outpatient this coming week.  They will set up follow-up with the A. fib clinic to explore options like ablation/cardioversion.  Patient did not do well with Xarelto/Eliquis in the past.  He is willing to try Coumadin again.  Of note one of his chemotherapy agents can cause increase in activity to Coumadin.  His INR today is 1.  Discussed with the patient risk versus benefits and I feel like this is necessary to start at this time.  He will be given his first dose here in the department and prescribed a couple days of Coumadin after which she will need to get his INR checked for further dosage  adjustment/treatment.  Patient feels well, has ambulated and continues to be stable.  Patient at this time appears safe and stable for discharge and will be treated as an outpatient.  Discharge plan and strict return to ED precautions discussed, patient verbalizes understanding and agreement.  Final Clinical Impression(s) / ED Diagnoses Final diagnoses:  None    Rx / DC Orders ED Discharge Orders     None        Lorelle Gibbs, DO 12/11/20 1439

## 2020-12-11 NOTE — Discharge Instructions (Addendum)
You have been seen and discharged from the emergency department.  You were found to be in persistent atrial fibrillation.  Your blood work was otherwise normal.  It is recommended that we start you on anticoagulation and have you follow-up with cardiology in the A. fib clinic to explore options like ablation versus cardioversion.  You have been given your first dose of Coumadin here in the department.  Continue to take your once a day Coumadin dose starting tomorrow Monday, 12/12/2020.  You will need to follow-up next week for repeat blood work/INR for dosage adjustment. INR will need to be monitored closely in the setting of your ongoing chemotherapy. Follow-up with your primary provider for reevaluation and further care. Take home medications as prescribed. If you have any worsening symptoms, palpitations, chest pain, difficulty breathing, severe headache, dizziness or further concerns for your health please return to an emergency department for further evaluation.

## 2020-12-12 ENCOUNTER — Telehealth: Payer: Self-pay | Admitting: Cardiology

## 2020-12-12 MED ORDER — WARFARIN SODIUM 5 MG PO TABS
ORAL_TABLET | ORAL | 0 refills | Status: DC
Start: 2020-12-12 — End: 2020-12-19

## 2020-12-12 NOTE — Telephone Encounter (Signed)
*  STAT* If patient is at the pharmacy, call can be transferred to refill team.   1. Which medications need to be refilled? (please list name of each medication and dose if known) warfarin (COUMADIN) 5 MG tablet  2. Which pharmacy/location (including street and city if local pharmacy) is medication to be sent to? [place], [place]  3. Do they need a 30 day or 90 day supply? 30 day

## 2020-12-12 NOTE — Telephone Encounter (Signed)
Received a refill request from pt for Warfarin 5mg . Called and spoke to pt who stated that he went to the ED yesterday and the Er physcian spoke with Dr Harl Bowie and he was going to start taking warfarin. Pt stated that he has been on warfarin in the past. Pt stated that he only had enough Warfarin for today and tomorrow. Informed pt that I would send in a refill for warfarin 5 mg. Scheduled him an appointment for 11/23 to have INR checked at the [facility] coumadin clinic.

## 2020-12-12 NOTE — Telephone Encounter (Signed)
Per Pt's Hospital Note:   Spoke with his cardiologist Dr. Harl Bowie who recommend starting him on anticoagulation having him follow-up as an outpatient this coming week.  They will set up follow-up with the A. fib clinic to explore options like ablation/cardioversion.  Patient did not do well with Xarelto/Eliquis in the past.  He is willing to try Coumadin again.  Of note one of his chemotherapy agents can cause increase in activity to Coumadin.  His INR today is 1.  Discussed with the patient risk versus benefits and I feel like this is necessary to start at this time.  He will be given his first dose here in the department and prescribed a couple days of Coumadin after which he will need to get his INR checked for further dosage adjustment/treatment.  There are no available appts this week with MD/APP schedules.

## 2020-12-12 NOTE — Telephone Encounter (Signed)
Patient states he was in the ED yesterday at [facility] in A-Fib.  He wants to know what he needs to do know.  He states something was mention to the ED doctor about an ablation or cardioversion.

## 2020-12-12 NOTE — Telephone Encounter (Addendum)
Prescription refill request received for warfarin Lov: 09/01/2020, Leonides Sake Next INR check: 11/23 Warfarin tablet strength: 5mg   Please see phone note from today for further documentation.

## 2020-12-13 ENCOUNTER — Inpatient Hospital Stay (HOSPITAL_COMMUNITY): Payer: Medicare HMO

## 2020-12-13 ENCOUNTER — Other Ambulatory Visit: Payer: Self-pay

## 2020-12-13 VITALS — BP 110/85 | HR 70 | Temp 97.5°F | Resp 18

## 2020-12-13 DIAGNOSIS — R55 Syncope and collapse: Secondary | ICD-10-CM

## 2020-12-13 DIAGNOSIS — K219 Gastro-esophageal reflux disease without esophagitis: Secondary | ICD-10-CM | POA: Diagnosis not present

## 2020-12-13 DIAGNOSIS — Z5111 Encounter for antineoplastic chemotherapy: Secondary | ICD-10-CM | POA: Diagnosis not present

## 2020-12-13 DIAGNOSIS — C182 Malignant neoplasm of ascending colon: Secondary | ICD-10-CM

## 2020-12-13 DIAGNOSIS — M129 Arthropathy, unspecified: Secondary | ICD-10-CM | POA: Diagnosis not present

## 2020-12-13 DIAGNOSIS — I4891 Unspecified atrial fibrillation: Secondary | ICD-10-CM | POA: Diagnosis not present

## 2020-12-13 DIAGNOSIS — E785 Hyperlipidemia, unspecified: Secondary | ICD-10-CM | POA: Diagnosis not present

## 2020-12-13 DIAGNOSIS — E119 Type 2 diabetes mellitus without complications: Secondary | ICD-10-CM | POA: Diagnosis not present

## 2020-12-13 DIAGNOSIS — D509 Iron deficiency anemia, unspecified: Secondary | ICD-10-CM | POA: Diagnosis not present

## 2020-12-13 DIAGNOSIS — G629 Polyneuropathy, unspecified: Secondary | ICD-10-CM | POA: Diagnosis not present

## 2020-12-13 DIAGNOSIS — Z452 Encounter for adjustment and management of vascular access device: Secondary | ICD-10-CM

## 2020-12-13 MED ORDER — SODIUM CHLORIDE 0.9 % IV SOLN: 8.0000 mg | Freq: Once | INTRAVENOUS | Status: DC

## 2020-12-13 MED ORDER — SODIUM CHLORIDE 0.9% FLUSH
10.0000 mL | Freq: Once | INTRAVENOUS | Status: AC
  Administered 2020-12-13: 10 mL via INTRAVENOUS

## 2020-12-13 MED ORDER — SODIUM CHLORIDE 0.9 % IV SOLN: INTRAVENOUS | Status: DC

## 2020-12-13 MED ORDER — ONDANSETRON HCL 4 MG/2ML IJ SOLN
8.0000 mg | Freq: Once | INTRAMUSCULAR | Status: AC
  Administered 2020-12-13: 8 mg via INTRAVENOUS

## 2020-12-13 MED ORDER — HEPARIN SOD (PORK) LOCK FLUSH 100 UNIT/ML IV SOLN
250.0000 [IU] | Freq: Once | INTRAVENOUS | Status: AC
  Administered 2020-12-13: 250 [IU] via INTRAVENOUS

## 2020-12-13 MED ORDER — ONDANSETRON HCL 4 MG/2ML IJ SOLN
8.0000 mg | Freq: Once | INTRAMUSCULAR | Status: DC
  Filled 2020-12-13: qty 4

## 2020-12-13 NOTE — Patient Instructions (Signed)
[facility]  Discharge Instructions: Thank you for choosing [facility] to provide your oncology and hematology care.  If you have a lab appointment with the Cancer Center, please come in thru the Main Entrance and check in at the main information desk.  Wear comfortable clothing and clothing appropriate for easy access to any Portacath or PICC line.   We strive to give you quality time with your provider. You may need to reschedule your appointment if you arrive late (15 or more minutes).  Arriving late affects you and other patients whose appointments are after yours.  Also, if you miss three or more appointments without notifying the office, you may be dismissed from the clinic at the provider's discretion.      For prescription refill requests, have your pharmacy contact our office and allow 72 hours for refills to be completed.        To help prevent nausea and vomiting after your treatment, we encourage you to take your nausea medication as directed.  BELOW ARE SYMPTOMS THAT SHOULD BE REPORTED IMMEDIATELY: *FEVER GREATER THAN 100.4 F (38 C) OR HIGHER *CHILLS OR SWEATING *NAUSEA AND VOMITING THAT IS NOT CONTROLLED WITH YOUR NAUSEA MEDICATION *UNUSUAL SHORTNESS OF BREATH *UNUSUAL BRUISING OR BLEEDING *URINARY PROBLEMS (pain or burning when urinating, or frequent urination) *BOWEL PROBLEMS (unusual diarrhea, constipation, pain near the anus) TENDERNESS IN MOUTH AND THROAT WITH OR WITHOUT PRESENCE OF ULCERS (sore throat, sores in mouth, or a toothache) UNUSUAL RASH, SWELLING OR PAIN  UNUSUAL VAGINAL DISCHARGE OR ITCHING   Items with * indicate a potential emergency and should be followed up as soon as possible or go to the Emergency Department if any problems should occur.  Please show the CHEMOTHERAPY ALERT CARD or IMMUNOTHERAPY ALERT CARD at check-in to the Emergency Department and triage nurse.  Should you have questions after your visit or need to cancel  or reschedule your appointment, please contact [facility] [phone]  and follow the prompts.  Office hours are 8:00 a.m. to 4:30 p.m. Monday - Friday. Please note that voicemails left after 4:00 p.m. may not be returned until the following business day.  We are closed weekends and major holidays. You have access to a nurse at all times for urgent questions. Please call the main number to the clinic [phone] and follow the prompts.  For any non-urgent questions, you may also contact your provider using MyChart. We now offer e-Visits for anyone 18 and older to request care online for non-urgent symptoms. For details visit [email or web address].   Also download the MyChart app! Go to the app store, search "MyChart", open the app, select [facility], and log in with your MyChart username and password.  Due to Covid, a mask is required upon entering the hospital/clinic. If you do not have a mask, one will be given to you upon arrival. For doctor visits, patients may have 1 support person aged 18 or older with them. For treatment visits, patients cannot have anyone with them due to current Covid guidelines and our immunocompromised population.  

## 2020-12-13 NOTE — Telephone Encounter (Signed)
Yes he was to restart coumadin, thx for setting him up in coumadin clinic. He has appt with afib clinic next week, they will help decide on next step for his afib    [name] MD

## 2020-12-13 NOTE — Progress Notes (Signed)
I was called to the patient's room due to syncopal episode.  By the time I arrived in the room, patient had regained consciousness.  Patient reports that he had some abdominal pain and rectal urgency earlier this morning, followed by a single episode of diarrhea.  Abdominal pain resolved after bowel movement, however patient felt weak and lightheaded.  When he arrived in his room at the cancer center for nurse visit, he felt increasingly lightheaded, and lost consciousness.  He was able to be aroused.  Noted to be diaphoretic, but with stable vital signs (documented elsewhere by RN).  EKG showed atrial fibrillation with rate in the 80s to 90s.  Patient did have 1 episode of vomiting after waking up from syncopal episode.  Patient admits that he has not been drinking much water due to change in taste and cold sensitivity from oxaliplatin.  Review of labs obtained in ED on Sunday 12/11/2020 (ED visit due to A. fib with RVR) show normal magnesium and potassium, but with creatinine slightly higher than baseline at 1.36, consistent with dehydration.  Patient received 1 L NS as well as IV Zofran while in clinic, and was feeling much better.  Denied any lightheadedness, headache, nausea, vomiting, or chest pain at the time of discharge.  Patient given strict precautions for return and verbalized understanding that he is to call the clinic with any new or worsening symptoms.  He is informed to call 911 and proceed to emergency department if he has any severe or life-threatening symptoms.  Patient educated that he needs to drink at least 64 ounces of water per day, but ideally should be drinking 80 to 100 ounces per day.   Tarri Abernethy, PA-C 12/13/2020 at 12:58 PM

## 2020-12-13 NOTE — Progress Notes (Signed)
0940:  Patient presents for a PICC line dressing change and flush.  Patient is in satisfactory condition with no complaints voiced.  Vital signs are stable.  0945:  Patient began to complain of nausea.  Patient's symptoms quickly elevated to feeling unusual and lightheaded.  Patient became diaphoretic and pale.    0947:  Patient had a syncopal episode.  Code alert was pulled.  Tarri Abernethy, PA-C responded.  Patient regained consciousness quickly.  He had one episode of vomiting after the syncopal episode.    Tarri Abernethy, PA-C ordered 1 liter of IVF over 1 hour and IV Zofran (see MAR for details).  EKG was performed at completion of IV fluids.  Tarri Abernethy reviewed and discussed results with patient.    PICC line was flushed and dressing was changed.  PICC line flushed without difficulty and good blood return was noted.  Antimicrobial disc in place.    Patient tolerated fluids and dressing change well with no further complaints.  Patient denied any symptoms at discharge and vital signs were stable.  Patient left [facility] via wheelchair with his wife.

## 2020-12-14 ENCOUNTER — Ambulatory Visit (INDEPENDENT_AMBULATORY_CARE_PROVIDER_SITE_OTHER): Payer: Medicare HMO | Admitting: *Deleted

## 2020-12-14 DIAGNOSIS — I480 Paroxysmal atrial fibrillation: Secondary | ICD-10-CM

## 2020-12-14 DIAGNOSIS — Z5181 Encounter for therapeutic drug level monitoring: Secondary | ICD-10-CM

## 2020-12-14 LAB — POCT INR: INR: 1 — AB (ref 2.0–3.0)

## 2020-12-14 NOTE — Patient Instructions (Addendum)
Description   Take 1.5 tablets of warfarin today and then continue to take warfarin 1 tablet daily. Recheck INR in 1 week. Coumadin Clinic [phone] Pt is on Xeloda (chemo) Pt is also getting chemo infusions last infusion on 12/8    A full discussion of the nature of anticoagulants has been carried out.  A benefit risk analysis has been presented to the patient, so that they understand the justification for choosing anticoagulation at this time. The need for frequent and regular monitoring, precise dosage adjustment and compliance is stressed.  Side effects of potential bleeding are discussed.  The patient should avoid any OTC items containing aspirin or ibuprofen, and should avoid great swings in general diet.  Avoid alcohol consumption.  Call if any signs of abnormal bleeding.

## 2020-12-16 ENCOUNTER — Other Ambulatory Visit: Payer: Self-pay | Admitting: Cardiology

## 2020-12-19 ENCOUNTER — Other Ambulatory Visit (HOSPITAL_COMMUNITY): Payer: Self-pay

## 2020-12-20 ENCOUNTER — Encounter (HOSPITAL_COMMUNITY): Payer: Self-pay | Admitting: Physician Assistant

## 2020-12-20 ENCOUNTER — Ambulatory Visit (INDEPENDENT_AMBULATORY_CARE_PROVIDER_SITE_OTHER): Payer: Medicare HMO | Admitting: *Deleted

## 2020-12-20 ENCOUNTER — Ambulatory Visit (HOSPITAL_COMMUNITY)
Admission: RE | Admit: 2020-12-20 | Discharge: 2020-12-20 | Disposition: A | Discharge status: Home / Self Care | Payer: Medicare HMO | Source: Ambulatory Visit | Attending: Physician Assistant | Admitting: Physician Assistant

## 2020-12-20 ENCOUNTER — Other Ambulatory Visit (HOSPITAL_COMMUNITY): Payer: Self-pay

## 2020-12-20 ENCOUNTER — Other Ambulatory Visit: Payer: Self-pay

## 2020-12-20 VITALS — BP 138/98 | HR 104 | Ht >= 80 in | Wt 288.4 lb

## 2020-12-20 DIAGNOSIS — Z7901 Long term (current) use of anticoagulants: Secondary | ICD-10-CM | POA: Diagnosis not present

## 2020-12-20 DIAGNOSIS — Z6831 Body mass index (BMI) 31.0-31.9, adult: Secondary | ICD-10-CM | POA: Insufficient documentation

## 2020-12-20 DIAGNOSIS — E669 Obesity, unspecified: Secondary | ICD-10-CM | POA: Diagnosis not present

## 2020-12-20 DIAGNOSIS — Z7182 Exercise counseling: Secondary | ICD-10-CM | POA: Diagnosis not present

## 2020-12-20 DIAGNOSIS — I10 Essential (primary) hypertension: Secondary | ICD-10-CM | POA: Diagnosis not present

## 2020-12-20 DIAGNOSIS — Z79899 Other long term (current) drug therapy: Secondary | ICD-10-CM | POA: Diagnosis not present

## 2020-12-20 DIAGNOSIS — R002 Palpitations: Secondary | ICD-10-CM | POA: Diagnosis not present

## 2020-12-20 DIAGNOSIS — I480 Paroxysmal atrial fibrillation: Secondary | ICD-10-CM | POA: Diagnosis not present

## 2020-12-20 DIAGNOSIS — C189 Malignant neoplasm of colon, unspecified: Secondary | ICD-10-CM | POA: Insufficient documentation

## 2020-12-20 DIAGNOSIS — E119 Type 2 diabetes mellitus without complications: Secondary | ICD-10-CM | POA: Diagnosis not present

## 2020-12-20 DIAGNOSIS — E785 Hyperlipidemia, unspecified: Secondary | ICD-10-CM | POA: Insufficient documentation

## 2020-12-20 DIAGNOSIS — Z5181 Encounter for therapeutic drug level monitoring: Secondary | ICD-10-CM

## 2020-12-20 DIAGNOSIS — I4819 Other persistent atrial fibrillation: Secondary | ICD-10-CM | POA: Insufficient documentation

## 2020-12-20 DIAGNOSIS — D6869 Other thrombophilia: Secondary | ICD-10-CM | POA: Insufficient documentation

## 2020-12-20 LAB — CBC
HCT: 37.1 % — ABNORMAL LOW (ref 39.0–52.0)
Hemoglobin: 12.2 g/dL — ABNORMAL LOW (ref 13.0–17.0)
MCH: 27.5 pg (ref 26.0–34.0)
MCHC: 32.9 g/dL (ref 30.0–36.0)
MCV: 83.6 fL (ref 80.0–100.0)
Platelets: 158 10*3/uL (ref 150–400)
RBC: 4.44 MIL/uL (ref 4.22–5.81)
RDW: 21.7 % — ABNORMAL HIGH (ref 11.5–15.5)
WBC: 4.3 10*3/uL (ref 4.0–10.5)
nRBC: 0 % (ref 0.0–0.2)

## 2020-12-20 LAB — BASIC METABOLIC PANEL
Anion gap: 8 (ref 5–15)
BUN: 13 mg/dL (ref 8–23)
CO2: 23 mmol/L (ref 22–32)
Calcium: 9.1 mg/dL (ref 8.9–10.3)
Chloride: 107 mmol/L (ref 98–111)
Creatinine, Ser: 1.23 mg/dL (ref 0.61–1.24)
GFR, Estimated: >60 mL/min (ref >60)
Glucose, Bld: 106 mg/dL — ABNORMAL HIGH (ref 70–99)
Potassium: 4.4 mmol/L (ref 3.5–5.1)
Sodium: 138 mmol/L (ref 135–145)

## 2020-12-20 LAB — POCT INR: INR: 1.7 — AB (ref 2.0–3.0)

## 2020-12-20 NOTE — Patient Instructions (Signed)
Increase warfarin to 1 tablet daily except 1 1/2 tablets on Tuesdays and Saturdays Recheck INR in 1 week. Coumadin Clinic [phone] Pt is on Xeloda (chemo) Pt is also getting chemo infusions. Last infusion is on 12/8

## 2020-12-20 NOTE — H&P (View-Only) (Signed)
Primary Care Physician: Caryl Bis, MD Primary Cardiologist: Dr Harl Bowie Primary Electrophysiologist: none Referring Physician: [facility]   Bradley Rodriguez is a 60 y.o. male with a history of HTN, HLD, DM, colon cancer, atrial fibrillation who presents for consultation in the [facility]. The patient was initially diagnosed with atrial fibrillation in 1996. He has required several cardioversions over the years. He was seen at the ED 12/11/20 with palpitations. ECG showed rate controlled afib. Patient was started on warfarin for a CHADS2VASC score of 2. Per history in chart, he did not tolerate Eliquis or Xarelto. He is symptomatic with palpitations. His INR today was 1.7.   Today, he denies symptoms of chest pain, shortness of breath, orthopnea, PND, lower extremity edema, dizziness, presyncope, syncope, snoring, daytime somnolence, bleeding, or neurologic sequela. The patient is tolerating medications without difficulties and is otherwise without complaint today.    Atrial Fibrillation Risk Factors:  he does not have symptoms or diagnosis of sleep apnea. he does not have a history of rheumatic fever. he does not have a history of alcohol use. The patient does not have a history of early familial atrial fibrillation or other arrhythmias.  he has a BMI of Body mass index is 31.68 kg/m.Marland Kitchen Filed Weights   12/20/20 0927  Weight: 130.8 kg    Family History  Problem Relation Age of Onset   Diabetes Mother    Hypertension Mother    Colon cancer Mother        dx. 71s   Diabetes Father    Colon cancer Father        dx. 66s   Lung cancer Maternal Grandmother        smoked   Bone cancer Maternal Grandfather        dx. 80s   Hypertension Other      Atrial Fibrillation Management history:  Previous antiarrhythmic drugs: flecainide  Previous cardioversions: 2014, 2016, 2017, 2018, 2020 Previous ablations: none CHADS2VASC score: 2 Anticoagulation  history: Xarelto, Eliquis, warfarin    Past Medical History:  Diagnosis Date   A-fib [facility])    DCCV 2009, 2012, 2014, Nov 2016   Anxiety    Arthritis    Knees, wrist, ankles   Cancer ([place]) 07/2019   Kidney   Colon cancer ([place])    Diabetes mellitus without complication ([place])    Dysrhythmia 1996   A- Fib   GERD (gastroesophageal reflux disease)    History of kidney stones    HX: anticoagulation    very remotely and briefly on COumadin aound one of his CV, 2014 on Eliquis had hematuria with renal calculi and stopped   Hyperlipidemia    patient denies this   Hypertension    IBS (irritable bowel syndrome)    Normal cardiac stress test    Normal nuclear stress test , 2001   Past Surgical History:  Procedure Laterality Date   BIOPSY  08/19/2020   Procedure: BIOPSY;  Surgeon: Harvel Quale, MD;  [facility];  Service: Gastroenterology;;   CARDIOVERSION N/A 10/15/2012   Procedure: CARDIOVERSION;  Surgeon: Larey Dresser, MD;  Location: [facility];  Service: Cardiovascular;  Laterality: N/A;   CARDIOVERSION N/A 11/26/2014   Procedure: CARDIOVERSION;  Surgeon: Arnoldo Lenis, MD;  [facility];  Service: Endoscopy;  Laterality: N/A;   CARDIOVERSION N/A 11/15/2015   Procedure: CARDIOVERSION;  Surgeon: Jerline Pain, MD;  Location: [facility];  Service: Cardiovascular;  Laterality: N/A;  CARDIOVERSION N/A 08/07/2016   Procedure: CARDIOVERSION;  Surgeon: Herminio Commons, MD;  [facility];  Service: Cardiovascular;  Laterality: N/A;   CARDIOVERSION N/A 09/11/2018   Procedure: CARDIOVERSION;  Surgeon: Arnoldo Lenis, MD;  [facility];  Service: Endoscopy;  Laterality: N/A;   CATARACT EXTRACTION W/PHACO Right 01/18/2014   Procedure: CATARACT EXTRACTION PHACO AND INTRAOCULAR LENS PLACEMENT (IOC);  Surgeon: Tonny Branch, MD;  [facility];  Service: Ophthalmology;  Laterality: Right;  CDE 9.95   COLONOSCOPY WITH PROPOFOL N/A 08/19/2020    Procedure: COLONOSCOPY WITH PROPOFOL;  Surgeon: Harvel Quale, MD;  [facility];  Service: Gastroenterology;  Laterality: N/A;  8:20   CYSTOSCOPY WITH LITHOLAPAXY N/A 10/06/2019   Procedure: CYSTOSCOPY WITH LASER LITHOLAPAXY;  Surgeon: Ceasar Mons, MD;  [facility];  Service: Urology;  Laterality: N/A;   ESOPHAGOGASTRODUODENOSCOPY (EGD) WITH PROPOFOL N/A 08/19/2020   Procedure: ESOPHAGOGASTRODUODENOSCOPY (EGD) WITH PROPOFOL;  Surgeon: Harvel Quale, MD;  [facility];  Service: Gastroenterology;  Laterality: N/A;   EYE SURGERY Right    detached retina   FOOT SURGERY Left    KNEE ARTHROSCOPY Right    LAPAROSCOPIC RIGHT HEMI COLECTOMY N/A 09/30/2020   Procedure: LAPAROSCOPIC RIGHT HEMI COLECTOMY;  Surgeon: Ileana Roup, MD;  [facility];  Service: General;  Laterality: N/A;   LITHOTRIPSY     for kidney stones 2002   POLYPECTOMY  08/19/2020   Procedure: POLYPECTOMY INTESTINAL;  Surgeon: Harvel Quale, MD;  [facility];  Service: Gastroenterology;;   POLYPECTOMY  08/19/2020   Procedure: POLYPECTOMY;  Surgeon: Harvel Quale, MD;  [facility];  Service: Gastroenterology;;   ROBOT ASSISTED LAPAROSCOPIC NEPHRECTOMY Left 10/06/2019   Procedure: XI ROBOTIC ASSISTED LAPAROSCOPIC NEPHRECTOMY WITH ADRENALECTOMY;  Surgeon: Ceasar Mons, MD;  [facility];  Service: Urology;  Laterality: Left;   SUBMUCOSAL TATTOO INJECTION  08/19/2020   Procedure: SUBMUCOSAL TATTOO INJECTION;  Surgeon: Harvel Quale, MD;  [facility];  Service: Gastroenterology;;   TEE WITHOUT CARDIOVERSION N/A 11/26/2014   Procedure: TRANSESOPHAGEAL ECHOCARDIOGRAM (TEE) WITH PROPOFOL;  Surgeon: Arnoldo Lenis, MD;  [facility];  Service: Endoscopy;  Laterality: N/A;   TEE WITHOUT CARDIOVERSION N/A 11/15/2015   Procedure: TRANSESOPHAGEAL ECHOCARDIOGRAM (TEE);  Surgeon: Jerline Pain, MD;  Location: [facility];  Service: Cardiovascular;  Laterality: N/A;   TEE WITHOUT CARDIOVERSION N/A 09/11/2018   Procedure: TRANSESOPHAGEAL ECHOCARDIOGRAM (TEE) WITH PROPOFOL;  Surgeon: Arnoldo Lenis, MD;  [facility];  Service: Endoscopy;  Laterality: N/A;   VASECTOMY      Current Outpatient Medications  Medication Sig Dispense Refill   amLODipine (NORVASC) 5 MG tablet TAKE 1 TABLET BY MOUTH EVERY DAY 90 tablet 3   atorvastatin (LIPITOR) 20 MG tablet Take 20 mg by mouth every evening.      Bioflavonoid Products (ESTER C PO) Take 1,000 mg by mouth daily.     Calcium Citrate-Vitamin D (CITRACAL + D PO) Take 1 tablet by mouth daily.     capecitabine (XELODA) 500 MG tablet Take 4 tablets (2,000 mg total) by mouth 2 (two) times daily after a meal. Take for 14 days, then hold for 7 days. Repeat every 21 days. 112 tablet 3   cholecalciferol (VITAMIN D3) 25 MCG (1000 UNIT) tablet Take 1,000 Units by mouth daily.     iron polysaccharides (NIFEREX) 150 MG capsule Take 150 mg by mouth 2 (two) times daily.  losartan (COZAAR) 100 MG tablet Take 100 mg by mouth every evening.      metoprolol tartrate (LOPRESSOR) 100 MG tablet TAKE 1 TABLET BY MOUTH TWICE A DAY 60 tablet 2   omeprazole (PRILOSEC) 40 MG capsule TAKE 1 CAPSULE BY MOUTH TWICE A DAY 180 capsule 1   OVER THE COUNTER MEDICATION Take 1 tablet by mouth 2 (two) times daily. Glucocil otc supplement     OXALIPLATIN IV Inject into the vein every 21 ( twenty-one) days.     prochlorperazine (COMPAZINE) 10 MG tablet Take 1 tablet (10 mg total) by mouth every 6 (six) hours as needed (Nausea or vomiting). 30 tablet 1   Semaglutide (OZEMPIC, 0.25 OR 0.5 MG/DOSE, [place]) Inject 0.5 mg into the skin every Monday.      Vitamin D, Ergocalciferol, (DRISDOL) 50000 UNITS CAPS capsule Take 50,000 Units by mouth every Wednesday.     warfarin (COUMADIN) 5 MG tablet TAKE ONE TABLET BY MOUTH DAILY OR AS DIRECTED BY COUDAMIN CLINIC 30 tablet 5    zinc gluconate 50 MG tablet Take 50 mg by mouth daily.     No current facility-administered medications for this encounter.    Allergies  Allergen Reactions   Ace Inhibitors Cough   Lisinopril Cough   Xarelto [Rivaroxaban] Other (See Comments)    Heart out of rhythm    Quinine Derivatives Rash    Social History   Socioeconomic History   Marital status: Married    Spouse name: Not on file   Number of children: Not on file   Years of education: Not on file   Highest education level: Not on file  Occupational History   Not on file  Tobacco Use   Smoking status: Never   Smokeless tobacco: Never  Vaping Use   Vaping Use: Never used  Substance and Sexual Activity   Alcohol use: No    Alcohol/week: 0.0 standard drinks   Drug use: No   Sexual activity: Yes    Birth control/protection: Surgical  Other Topics Concern   Not on file  Social History Narrative   Not on file   Social Determinants of Health   Financial Resource Strain: Low Risk    Difficulty of Paying Living Expenses: Not hard at all  Food Insecurity: No Food Insecurity   Worried About Charity fundraiser in the Last Year: Never true   Ran Out of Food in the Last Year: Never true  Transportation Needs: No Transportation Needs   Lack of Transportation (Medical): No   Lack of Transportation (Non-Medical): No  Physical Activity: Inactive   Days of Exercise per Week: 0 days   Minutes of Exercise per Session: 0 min  Stress: No Stress Concern Present   Feeling of Stress : Not at all  Social Connections: Moderately Integrated   Frequency of Communication with Friends and Family: More than three times a week   Frequency of Social Gatherings with Friends and Family: More than three times a week   Attends Religious Services: More than 4 times per year   Active Member of [employer] or Organizations: No   Attends Archivist Meetings: Never   Marital Status: Married  Human resources officer Violence: Not At Risk    Fear of Current or Ex-Partner: No   Emotionally Abused: No   Physically Abused: No   Sexually Abused: No     ROS- All systems are reviewed and negative except as per the HPI above.  Physical Exam: Vitals:  12/20/20 0927  BP: (!) 138/98  Pulse: (!) 104  Weight: 130.8 kg  Height: 6\' 8"  (2.032 m)    GEN- The patient is a well appearing obese male, alert and oriented x 3 today.   Head- normocephalic, atraumatic Eyes-  Sclera clear, conjunctiva pink Ears- hearing intact Oropharynx- clear Neck- supple  Lungs- Clear to ausculation bilaterally, normal work of breathing Heart- irregular rate and rhythm, no murmurs, rubs or gallops  GI- soft, NT, ND, + BS Extremities- no clubbing, cyanosis, or edema MS- no significant deformity or atrophy Skin- no rash or lesion Psych- euthymic mood, full affect Neuro- strength and sensation are intact  Wt Readings from Last 3 Encounters:  12/20/20 130.8 kg  12/11/20 124.7 kg  12/07/20 131.1 kg    EKG today demonstrates  Afib Vent. rate 104 BPM PR interval * ms QRS duration 100 ms QT/QTcB 352/462 ms  TEE 09/11/18  1. The left ventricle has normal systolic function, with an ejection  fraction of 55-60%. The cavity size was normal.   2. Left atrial size was Left atrium is enlarged.   3. No evidence of intracardiac thrombus. Normal LA appendage without  thrombus, normal emptying velocity 70cm/s.   4. Mitral valve regurgitation is mild to moderate by color flow Doppler.   5. The aortic valve is tricuspid No stenosis of the aortic valve.   6. The aortic root is normal in size and structure.   Epic records are reviewed at length today  CHA2DS2-VASc Score = 2  The patient's score is based upon: CHF History: 0 HTN History: 1 Diabetes History: 1 Stroke History: 0 Vascular Disease History: 0 Age Score: 0 Gender Score: 0      ASSESSMENT AND PLAN: 1. Persistent Atrial Fibrillation (ICD10:  I48.19) The patient's CHA2DS2-VASc score is  2, indicating a 2.2% annual risk of stroke.   Patient has a long history of afib. Last DCCV was 2020. We discussed rhythm control options today.  Will plan on TEE guided DCCV once his INR is therapeutic, he does not feel he can wait for 4 therapeutic INRs.  Check bmet/cbc Continue warfarin Continue Lopressor 100 mg BID  2. Secondary Hypercoagulable State (ICD10:  D68.69) The patient is at significant risk for stroke/thromboembolism based upon his CHA2DS2-VASc Score of 2.  Continue Warfarin (Coumadin).   3. Obesity Body mass index is 31.68 kg/m. Lifestyle modification was discussed at length including regular exercise and weight reduction.  4. HTN Stable, no changes today.  5. Colon cancer Plans per oncology    Follow up in the AF clinic post DCCV.    [facility] [address] [place], [place] [address] [phone] 12/20/2020 9:37 AM

## 2020-12-20 NOTE — Patient Instructions (Signed)
Cardioversion scheduled for Tuesday, December 13th  - Have INR check with Lattie Haw prior to coming to [facility] at the [facility] and go to admitting at [place] not eat or drink anything after midnight the night prior to your procedure.  - Take all your morning medication (except diabetic medications) with a sip of water prior to arrival.  - You will not be able to drive home after your procedure.  - Do NOT miss any doses of your blood thinner - if you should miss a dose please notify our office immediately.  - If you feel as if you go back into normal rhythm prior to scheduled cardioversion, please notify our office immediately. If your procedure is canceled in the cardioversion suite you will be charged a cancellation fee. Patients will be asked to: to mask in public and hand hygiene (no longer quarantine) in the 3 days prior to surgery, to report if any COVID-19-like illness or household contacts to COVID-19 to determine need for testing

## 2020-12-20 NOTE — Progress Notes (Signed)
Primary Care Physician: Caryl Bis, MD Primary Cardiologist: Dr Harl Bowie Primary Electrophysiologist: none Referring Physician: [facility]   Bradley Rodriguez is a 60 y.o. male with a history of HTN, HLD, DM, colon cancer, atrial fibrillation who presents for consultation in the [facility]. The patient was initially diagnosed with atrial fibrillation in 1996. He has required several cardioversions over the years. He was seen at the ED 12/11/20 with palpitations. ECG showed rate controlled afib. Patient was started on warfarin for a CHADS2VASC score of 2. Per history in chart, he did not tolerate Eliquis or Xarelto. He is symptomatic with palpitations. His INR today was 1.7.   Today, he denies symptoms of chest pain, shortness of breath, orthopnea, PND, lower extremity edema, dizziness, presyncope, syncope, snoring, daytime somnolence, bleeding, or neurologic sequela. The patient is tolerating medications without difficulties and is otherwise without complaint today.    Atrial Fibrillation Risk Factors:  he does not have symptoms or diagnosis of sleep apnea. he does not have a history of rheumatic fever. he does not have a history of alcohol use. The patient does not have a history of early familial atrial fibrillation or other arrhythmias.  he has a BMI of Body mass index is 31.68 kg/m.Marland Kitchen Filed Weights   12/20/20 0927  Weight: 130.8 kg    Family History  Problem Relation Age of Onset   Diabetes Mother    Hypertension Mother    Colon cancer Mother        dx. 77s   Diabetes Father    Colon cancer Father        dx. 60s   Lung cancer Maternal Grandmother        smoked   Bone cancer Maternal Grandfather        dx. 80s   Hypertension Other      Atrial Fibrillation Management history:  Previous antiarrhythmic drugs: flecainide  Previous cardioversions: 2014, 2016, 2017, 2018, 2020 Previous ablations: none CHADS2VASC score: 2 Anticoagulation  history: Xarelto, Eliquis, warfarin    Past Medical History:  Diagnosis Date   A-fib [facility])    DCCV 2009, 2012, 2014, Nov 2016   Anxiety    Arthritis    Knees, wrist, ankles   Cancer ([place]) 07/2019   Kidney   Colon cancer ([place])    Diabetes mellitus without complication ([place])    Dysrhythmia 1996   A- Fib   GERD (gastroesophageal reflux disease)    History of kidney stones    HX: anticoagulation    very remotely and briefly on COumadin aound one of his CV, 2014 on Eliquis had hematuria with renal calculi and stopped   Hyperlipidemia    patient denies this   Hypertension    IBS (irritable bowel syndrome)    Normal cardiac stress test    Normal nuclear stress test , 2001   Past Surgical History:  Procedure Laterality Date   BIOPSY  08/19/2020   Procedure: BIOPSY;  Surgeon: Harvel Quale, MD;  [facility];  Service: Gastroenterology;;   CARDIOVERSION N/A 10/15/2012   Procedure: CARDIOVERSION;  Surgeon: Larey Dresser, MD;  Location: [facility];  Service: Cardiovascular;  Laterality: N/A;   CARDIOVERSION N/A 11/26/2014   Procedure: CARDIOVERSION;  Surgeon: Arnoldo Lenis, MD;  [facility];  Service: Endoscopy;  Laterality: N/A;   CARDIOVERSION N/A 11/15/2015   Procedure: CARDIOVERSION;  Surgeon: Jerline Pain, MD;  Location: [facility];  Service: Cardiovascular;  Laterality: N/A;  CARDIOVERSION N/A 08/07/2016   Procedure: CARDIOVERSION;  Surgeon: Herminio Commons, MD;  [facility];  Service: Cardiovascular;  Laterality: N/A;   CARDIOVERSION N/A 09/11/2018   Procedure: CARDIOVERSION;  Surgeon: Arnoldo Lenis, MD;  [facility];  Service: Endoscopy;  Laterality: N/A;   CATARACT EXTRACTION W/PHACO Right 01/18/2014   Procedure: CATARACT EXTRACTION PHACO AND INTRAOCULAR LENS PLACEMENT (IOC);  Surgeon: Tonny Branch, MD;  [facility];  Service: Ophthalmology;  Laterality: Right;  CDE 9.95   COLONOSCOPY WITH PROPOFOL N/A 08/19/2020    Procedure: COLONOSCOPY WITH PROPOFOL;  Surgeon: Harvel Quale, MD;  [facility];  Service: Gastroenterology;  Laterality: N/A;  8:20   CYSTOSCOPY WITH LITHOLAPAXY N/A 10/06/2019   Procedure: CYSTOSCOPY WITH LASER LITHOLAPAXY;  Surgeon: Ceasar Mons, MD;  [facility];  Service: Urology;  Laterality: N/A;   ESOPHAGOGASTRODUODENOSCOPY (EGD) WITH PROPOFOL N/A 08/19/2020   Procedure: ESOPHAGOGASTRODUODENOSCOPY (EGD) WITH PROPOFOL;  Surgeon: Harvel Quale, MD;  [facility];  Service: Gastroenterology;  Laterality: N/A;   EYE SURGERY Right    detached retina   FOOT SURGERY Left    KNEE ARTHROSCOPY Right    LAPAROSCOPIC RIGHT HEMI COLECTOMY N/A 09/30/2020   Procedure: LAPAROSCOPIC RIGHT HEMI COLECTOMY;  Surgeon: Ileana Roup, MD;  [facility];  Service: General;  Laterality: N/A;   LITHOTRIPSY     for kidney stones 2002   POLYPECTOMY  08/19/2020   Procedure: POLYPECTOMY INTESTINAL;  Surgeon: Harvel Quale, MD;  [facility];  Service: Gastroenterology;;   POLYPECTOMY  08/19/2020   Procedure: POLYPECTOMY;  Surgeon: Harvel Quale, MD;  [facility];  Service: Gastroenterology;;   ROBOT ASSISTED LAPAROSCOPIC NEPHRECTOMY Left 10/06/2019   Procedure: XI ROBOTIC ASSISTED LAPAROSCOPIC NEPHRECTOMY WITH ADRENALECTOMY;  Surgeon: Ceasar Mons, MD;  [facility];  Service: Urology;  Laterality: Left;   SUBMUCOSAL TATTOO INJECTION  08/19/2020   Procedure: SUBMUCOSAL TATTOO INJECTION;  Surgeon: Harvel Quale, MD;  [facility];  Service: Gastroenterology;;   TEE WITHOUT CARDIOVERSION N/A 11/26/2014   Procedure: TRANSESOPHAGEAL ECHOCARDIOGRAM (TEE) WITH PROPOFOL;  Surgeon: Arnoldo Lenis, MD;  [facility];  Service: Endoscopy;  Laterality: N/A;   TEE WITHOUT CARDIOVERSION N/A 11/15/2015   Procedure: TRANSESOPHAGEAL ECHOCARDIOGRAM (TEE);  Surgeon: Jerline Pain, MD;  Location: [facility];  Service: Cardiovascular;  Laterality: N/A;   TEE WITHOUT CARDIOVERSION N/A 09/11/2018   Procedure: TRANSESOPHAGEAL ECHOCARDIOGRAM (TEE) WITH PROPOFOL;  Surgeon: Arnoldo Lenis, MD;  [facility];  Service: Endoscopy;  Laterality: N/A;   VASECTOMY      Current Outpatient Medications  Medication Sig Dispense Refill   amLODipine (NORVASC) 5 MG tablet TAKE 1 TABLET BY MOUTH EVERY DAY 90 tablet 3   atorvastatin (LIPITOR) 20 MG tablet Take 20 mg by mouth every evening.      Bioflavonoid Products (ESTER C PO) Take 1,000 mg by mouth daily.     Calcium Citrate-Vitamin D (CITRACAL + D PO) Take 1 tablet by mouth daily.     capecitabine (XELODA) 500 MG tablet Take 4 tablets (2,000 mg total) by mouth 2 (two) times daily after a meal. Take for 14 days, then hold for 7 days. Repeat every 21 days. 112 tablet 3   cholecalciferol (VITAMIN D3) 25 MCG (1000 UNIT) tablet Take 1,000 Units by mouth daily.     iron polysaccharides (NIFEREX) 150 MG capsule Take 150 mg by mouth 2 (two) times daily.  losartan (COZAAR) 100 MG tablet Take 100 mg by mouth every evening.      metoprolol tartrate (LOPRESSOR) 100 MG tablet TAKE 1 TABLET BY MOUTH TWICE A DAY 60 tablet 2   omeprazole (PRILOSEC) 40 MG capsule TAKE 1 CAPSULE BY MOUTH TWICE A DAY 180 capsule 1   OVER THE COUNTER MEDICATION Take 1 tablet by mouth 2 (two) times daily. Glucocil otc supplement     OXALIPLATIN IV Inject into the vein every 21 ( twenty-one) days.     prochlorperazine (COMPAZINE) 10 MG tablet Take 1 tablet (10 mg total) by mouth every 6 (six) hours as needed (Nausea or vomiting). 30 tablet 1   Semaglutide (OZEMPIC, 0.25 OR 0.5 MG/DOSE, [place]) Inject 0.5 mg into the skin every Monday.      Vitamin D, Ergocalciferol, (DRISDOL) 50000 UNITS CAPS capsule Take 50,000 Units by mouth every Wednesday.     warfarin (COUMADIN) 5 MG tablet TAKE ONE TABLET BY MOUTH DAILY OR AS DIRECTED BY COUDAMIN CLINIC 30 tablet 5    zinc gluconate 50 MG tablet Take 50 mg by mouth daily.     No current facility-administered medications for this encounter.    Allergies  Allergen Reactions   Ace Inhibitors Cough   Lisinopril Cough   Xarelto [Rivaroxaban] Other (See Comments)    Heart out of rhythm    Quinine Derivatives Rash    Social History   Socioeconomic History   Marital status: Married    Spouse name: Not on file   Number of children: Not on file   Years of education: Not on file   Highest education level: Not on file  Occupational History   Not on file  Tobacco Use   Smoking status: Never   Smokeless tobacco: Never  Vaping Use   Vaping Use: Never used  Substance and Sexual Activity   Alcohol use: No    Alcohol/week: 0.0 standard drinks   Drug use: No   Sexual activity: Yes    Birth control/protection: Surgical  Other Topics Concern   Not on file  Social History Narrative   Not on file   Social Determinants of Health   Financial Resource Strain: Low Risk    Difficulty of Paying Living Expenses: Not hard at all  Food Insecurity: No Food Insecurity   Worried About Charity fundraiser in the Last Year: Never true   Ran Out of Food in the Last Year: Never true  Transportation Needs: No Transportation Needs   Lack of Transportation (Medical): No   Lack of Transportation (Non-Medical): No  Physical Activity: Inactive   Days of Exercise per Week: 0 days   Minutes of Exercise per Session: 0 min  Stress: No Stress Concern Present   Feeling of Stress : Not at all  Social Connections: Moderately Integrated   Frequency of Communication with Friends and Family: More than three times a week   Frequency of Social Gatherings with Friends and Family: More than three times a week   Attends Religious Services: More than 4 times per year   Active Member of [employer] or Organizations: No   Attends Archivist Meetings: Never   Marital Status: Married  Human resources officer Violence: Not At Risk    Fear of Current or Ex-Partner: No   Emotionally Abused: No   Physically Abused: No   Sexually Abused: No     ROS- All systems are reviewed and negative except as per the HPI above.  Physical Exam: Vitals:  12/20/20 0927  BP: (!) 138/98  Pulse: (!) 104  Weight: 130.8 kg  Height: 6\' 8"  (2.032 m)    GEN- The patient is a well appearing obese male, alert and oriented x 3 today.   Head- normocephalic, atraumatic Eyes-  Sclera clear, conjunctiva pink Ears- hearing intact Oropharynx- clear Neck- supple  Lungs- Clear to ausculation bilaterally, normal work of breathing Heart- irregular rate and rhythm, no murmurs, rubs or gallops  GI- soft, NT, ND, + BS Extremities- no clubbing, cyanosis, or edema MS- no significant deformity or atrophy Skin- no rash or lesion Psych- euthymic mood, full affect Neuro- strength and sensation are intact  Wt Readings from Last 3 Encounters:  12/20/20 130.8 kg  12/11/20 124.7 kg  12/07/20 131.1 kg    EKG today demonstrates  Afib Vent. rate 104 BPM PR interval * ms QRS duration 100 ms QT/QTcB 352/462 ms  TEE 09/11/18  1. The left ventricle has normal systolic function, with an ejection  fraction of 55-60%. The cavity size was normal.   2. Left atrial size was Left atrium is enlarged.   3. No evidence of intracardiac thrombus. Normal LA appendage without  thrombus, normal emptying velocity 70cm/s.   4. Mitral valve regurgitation is mild to moderate by color flow Doppler.   5. The aortic valve is tricuspid No stenosis of the aortic valve.   6. The aortic root is normal in size and structure.   Epic records are reviewed at length today  CHA2DS2-VASc Score = 2  The patient's score is based upon: CHF History: 0 HTN History: 1 Diabetes History: 1 Stroke History: 0 Vascular Disease History: 0 Age Score: 0 Gender Score: 0      ASSESSMENT AND PLAN: 1. Persistent Atrial Fibrillation (ICD10:  I48.19) The patient's CHA2DS2-VASc score is  2, indicating a 2.2% annual risk of stroke.   Patient has a long history of afib. Last DCCV was 2020. We discussed rhythm control options today.  Will plan on TEE guided DCCV once his INR is therapeutic, he does not feel he can wait for 4 therapeutic INRs.  Check bmet/cbc Continue warfarin Continue Lopressor 100 mg BID  2. Secondary Hypercoagulable State (ICD10:  D68.69) The patient is at significant risk for stroke/thromboembolism based upon his CHA2DS2-VASc Score of 2.  Continue Warfarin (Coumadin).   3. Obesity Body mass index is 31.68 kg/m. Lifestyle modification was discussed at length including regular exercise and weight reduction.  4. HTN Stable, no changes today.  5. Colon cancer Plans per oncology    Follow up in the AF clinic post DCCV.    [facility] [address] [place], [place] [address] [phone] 12/20/2020 9:37 AM

## 2020-12-21 DIAGNOSIS — E7849 Other hyperlipidemia: Secondary | ICD-10-CM | POA: Diagnosis not present

## 2020-12-21 DIAGNOSIS — I10 Essential (primary) hypertension: Secondary | ICD-10-CM | POA: Diagnosis not present

## 2020-12-21 DIAGNOSIS — Z7984 Long term (current) use of oral hypoglycemic drugs: Secondary | ICD-10-CM | POA: Diagnosis not present

## 2020-12-21 DIAGNOSIS — I480 Paroxysmal atrial fibrillation: Secondary | ICD-10-CM | POA: Diagnosis not present

## 2020-12-21 DIAGNOSIS — E1165 Type 2 diabetes mellitus with hyperglycemia: Secondary | ICD-10-CM | POA: Diagnosis not present

## 2020-12-22 ENCOUNTER — Encounter (HOSPITAL_COMMUNITY): Payer: Self-pay | Admitting: Cardiology

## 2020-12-22 ENCOUNTER — Inpatient Hospital Stay (HOSPITAL_COMMUNITY): Payer: Medicare HMO | Attending: Hematology

## 2020-12-22 ENCOUNTER — Other Ambulatory Visit: Payer: Self-pay

## 2020-12-22 DIAGNOSIS — Z5111 Encounter for antineoplastic chemotherapy: Secondary | ICD-10-CM | POA: Diagnosis not present

## 2020-12-22 DIAGNOSIS — C182 Malignant neoplasm of ascending colon: Secondary | ICD-10-CM | POA: Insufficient documentation

## 2020-12-22 LAB — COMPREHENSIVE METABOLIC PANEL
ALT: 22 U/L (ref 0–44)
AST: 24 U/L (ref 15–41)
Albumin: 3.9 g/dL (ref 3.5–5.0)
Alkaline Phosphatase: 99 U/L (ref 38–126)
Anion gap: 7 (ref 5–15)
BUN: 18 mg/dL (ref 8–23)
CO2: 26 mmol/L (ref 22–32)
Calcium: 8.9 mg/dL (ref 8.9–10.3)
Chloride: 107 mmol/L (ref 98–111)
Creatinine, Ser: 1.14 mg/dL (ref 0.61–1.24)
GFR, Estimated: >60 mL/min (ref >60)
Glucose, Bld: 98 mg/dL (ref 70–99)
Potassium: 3.8 mmol/L (ref 3.5–5.1)
Sodium: 140 mmol/L (ref 135–145)
Total Bilirubin: 1.4 mg/dL — ABNORMAL HIGH (ref 0.3–1.2)
Total Protein: 6.8 g/dL (ref 6.5–8.1)

## 2020-12-22 LAB — MAGNESIUM: Magnesium: 1.7 mg/dL (ref 1.7–2.4)

## 2020-12-22 MED ORDER — SODIUM CHLORIDE 0.9% FLUSH
10.0000 mL | Freq: Once | INTRAVENOUS | Status: AC
  Administered 2020-12-22: 10 mL via INTRAVENOUS

## 2020-12-22 MED ORDER — HEPARIN SOD (PORK) LOCK FLUSH 100 UNIT/ML IV SOLN
500.0000 [IU] | Freq: Once | INTRAVENOUS | Status: AC
  Administered 2020-12-22: 500 [IU] via INTRAVENOUS

## 2020-12-22 MED ORDER — SODIUM CHLORIDE 0.9 % IV SOLN: Freq: Once | INTRAVENOUS | Status: AC

## 2020-12-22 NOTE — Progress Notes (Signed)
Patient presents today for PICC line flush/labs and dressing change per providers order.  Patient states that he feels dehydrated and feels like he may need fluids.  Anastasio Champion RN (charge) to speak with Dr. Delton Coombes to decide if fluids are appropriate.   PICC flushed with labs and dressing change.  PICC located right arm.  Good blood return present. PICC flushed with 63ml NS and 250U Heparin, see MAR for further details.    Patient received one liter of normal saline today.   Discharge from clinic ambulatory in stable condition.  Alert and oriented X 3.  Patient declined AVS.  Follow up with [facility] as scheduled.

## 2020-12-22 NOTE — Patient Instructions (Signed)
[facility]  Discharge Instructions: Thank you for choosing [facility] to provide your oncology and hematology care.  If you have a lab appointment with the [facility], please come in thru the Main Entrance and check in at the main information desk.  Wear comfortable clothing and clothing appropriate for easy access to any Portacath or PICC line.   We strive to give you quality time with your provider. You may need to reschedule your appointment if you arrive late (15 or more minutes).  Arriving late affects you and other patients whose appointments are after yours.  Also, if you miss three or more appointments without notifying the office, you may be dismissed from the clinic at the provider's discretion.      For prescription refill requests, have your pharmacy contact our office and allow 72 hours for refills to be completed.    Today you received the following chemotherapy and/or immunotherapy agents 1 liter of normal saline      To help prevent nausea and vomiting after your treatment, we encourage you to take your nausea medication as directed.  BELOW ARE SYMPTOMS THAT SHOULD BE REPORTED IMMEDIATELY: *FEVER GREATER THAN 100.4 F (38 C) OR HIGHER *CHILLS OR SWEATING *NAUSEA AND VOMITING THAT IS NOT CONTROLLED WITH YOUR NAUSEA MEDICATION *UNUSUAL SHORTNESS OF BREATH *UNUSUAL BRUISING OR BLEEDING *URINARY PROBLEMS (pain or burning when urinating, or frequent urination) *BOWEL PROBLEMS (unusual diarrhea, constipation, pain near the anus) TENDERNESS IN MOUTH AND THROAT WITH OR WITHOUT PRESENCE OF ULCERS (sore throat, sores in mouth, or a toothache) UNUSUAL RASH, SWELLING OR PAIN  UNUSUAL VAGINAL DISCHARGE OR ITCHING   Items with * indicate a potential emergency and should be followed up as soon as possible or go to the Emergency Department if any problems should occur.  Please show the CHEMOTHERAPY ALERT CARD or IMMUNOTHERAPY ALERT CARD at check-in to the  Emergency Department and triage nurse.  Should you have questions after your visit or need to cancel or reschedule your appointment, please contact [facility] [phone]  and follow the prompts.  Office hours are 8:00 a.m. to 4:30 p.m. Monday - Friday. Please note that voicemails left after 4:00 p.m. may not be returned until the following business day.  We are closed weekends and major holidays. You have access to a nurse at all times for urgent questions. Please call the main number to the clinic [phone] and follow the prompts.  For any non-urgent questions, you may also contact your provider using MyChart. We now offer e-Visits for anyone 42 and older to request care online for non-urgent symptoms. For details visit [email or web address].   Also download the MyChart app! Go to the app store, search "MyChart", open the app, select , and log in with your MyChart username and password.  Due to Covid, a mask is required upon entering the hospital/clinic. If you do not have a mask, one will be given to you upon arrival. For doctor visits, patients may have 1 support person aged 51 or older with them. For treatment visits, patients cannot have anyone with them due to current Covid guidelines and our immunocompromised population.

## 2020-12-23 NOTE — Progress Notes (Signed)
Attempted to obtain medical history via telephone, unable to reach at this time. I left a voicemail to return pre surgical testing department's phone call.  

## 2020-12-27 ENCOUNTER — Ambulatory Visit (INDEPENDENT_AMBULATORY_CARE_PROVIDER_SITE_OTHER): Payer: Medicare HMO | Admitting: *Deleted

## 2020-12-27 DIAGNOSIS — I480 Paroxysmal atrial fibrillation: Secondary | ICD-10-CM

## 2020-12-27 DIAGNOSIS — Z5181 Encounter for therapeutic drug level monitoring: Secondary | ICD-10-CM | POA: Diagnosis not present

## 2020-12-27 LAB — CBC WITH DIFFERENTIAL/PLATELET
Abs Immature Granulocytes: 0.02 10*3/uL (ref 0.00–0.07)
Basophils Absolute: 0 10*3/uL (ref 0.0–0.1)
Basophils Relative: 1 %
Eosinophils Absolute: 0.1 10*3/uL (ref 0.0–0.5)
Eosinophils Relative: 2 %
HCT: 35.4 % — ABNORMAL LOW (ref 39.0–52.0)
Hemoglobin: 11.5 g/dL — ABNORMAL LOW (ref 13.0–17.0)
Immature Granulocytes: 1 %
Lymphocytes Relative: 21 %
Lymphs Abs: 0.8 10*3/uL (ref 0.7–4.0)
MCH: 27.4 pg (ref 26.0–34.0)
MCHC: 32.5 g/dL (ref 30.0–36.0)
MCV: 84.3 fL (ref 80.0–100.0)
Monocytes Absolute: 0.6 10*3/uL (ref 0.1–1.0)
Monocytes Relative: 16 %
Neutro Abs: 2.2 10*3/uL (ref 1.7–7.7)
Neutrophils Relative %: 59 %
Platelets: 180 10*3/uL (ref 150–400)
RBC: 4.2 MIL/uL — ABNORMAL LOW (ref 4.22–5.81)
RDW: 22.4 % — ABNORMAL HIGH (ref 11.5–15.5)
WBC: 3.6 10*3/uL — ABNORMAL LOW (ref 4.0–10.5)
nRBC: 0 % (ref 0.0–0.2)

## 2020-12-27 LAB — POCT INR: INR: 1.9 — AB (ref 2.0–3.0)

## 2020-12-27 NOTE — Patient Instructions (Addendum)
Pending TEE/DCCV on 01/02/21 Take warfarin 2 tablets tonight then increase dose to 1 1/2 tablets daily except 1 tablet on Mondays, Wednesdays and Fridays Recheck INR in 1 week. Pending TEE on 01/03/21 [facility] [phone] Pt is on Xeloda (chemo) Pt is also getting chemo infusions. Last infusion is on 12/8

## 2020-12-28 NOTE — Progress Notes (Signed)
[facility] [address], [place] [address]   CLINIC:  Medical Oncology/Hematology  PCP:  Richardean Chimera, MD [address] [place] [address] [phone]   REASON FOR VISIT:  Follow-up for colon cancer  PRIOR THERAPY: Left nephrectomy by Dr. 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 A variant of uncertain significance was identified in the MET and RAD51D genes. Report date is 10/27/2020.   The Common Hereditary Cancers + RNA Panel offered by [facility] includes sequencing, deletion/duplication, and RNA testing of the following 47 genes: APC, ATM, AXIN2, BARD1, BMPR1A, BRCA1, BRCA2, BRIP1, CDH1, CDK4*, CDKN2A (p14ARF)*, CDKN2A (p16INK4a)*, CHEK2, CTNNA1, DICER1, EPCAM (Deletion/duplication testing only), GREM1 (promoter region deletion/duplication testing only), KIT, MEN1, MLH1, MSH2, MSH3, MSH6, MUTYH, NBN, NF1, NHTL1, PALB2, PDGFRA*, PMS2, POLD1, POLE, PTEN, RAD50, RAD51C, RAD51D, SDHB, SDHC, SDHD, SMAD4, SMARCA4. STK11, TP53, TSC1, TSC2, and VHL.  The following genes were evaluated for sequence changes only: SDHA and HOXB13 c.251G>A variant only.  RNA analysis is not performed for the * genes.       CANCER STAGING:  Cancer Staging  Cancer of ascending colon [facility]) Staging form: Colon and Rectum, AJCC 8th Edition - Clinical stage from 09/01/2020: Stage IIIB (cT3, cN1b, cM0) - Unsigned   INTERVAL HISTORY:  [name], a 60 y.o. male, returns for routine follow-up and consideration for next cycle of chemotherapy. Bradley Rodriguez  was last seen on 12/07/2020.  Due for cycle #4 of Xelox today.   Overall, he tells me he has been feeling pretty well. He reports tingling in his fingertips and stable neuropathy in his feet; his neuropathy worsens for 5-6 days following treatment before returning to baseline. He denies difficulties with balance. He reports loose stools for 1-2 days following treatment before resolving. He denies redness in his palms or soles. He reports constant palpitations which worsens when laying down. His appetite is good. He reports changes in taste for 4 days following treatment before returning to baseline.   Overall, he feels ready for next cycle of chemo today.    REVIEW OF SYSTEMS:  Review of Systems  Constitutional:  Negative for appetite change (75%) and fatigue (75%).  Cardiovascular:  Positive for palpitations.  Neurological:  Positive for numbness (toes and soles).  All other systems reviewed and are negative.  PAST MEDICAL/SURGICAL HISTORY:  Past Medical History:  Diagnosis Date   A-fib [facility])    DCCV 2009, 2012, 2014, Nov 2016   Anxiety    Arthritis    Knees, wrist, ankles   Cancer (HCC) 07/2019   Kidney   Colon cancer (HCC)    Diabetes mellitus without complication (HCC)    Dysrhythmia 1996   A- Fib   GERD (gastroesophageal reflux disease)    History of kidney stones    HX: anticoagulation    very remotely and briefly on COumadin aound one of his CV, 2014 on Eliquis had hematuria with renal calculi and stopped   Hyperlipidemia    patient denies this  Hypertension    IBS (irritable bowel syndrome)    Normal cardiac stress test    Normal nuclear stress test , 2001   Past Surgical History:  Procedure Laterality Date   BIOPSY  08/19/2020   Procedure: BIOPSY;  Surgeon: Dolores Frame, MD;  [facility];  Service: Gastroenterology;;   CARDIOVERSION N/A 10/15/2012   Procedure: CARDIOVERSION;  Surgeon: Laurey Morale, MD;  Location: [facility] ENDOSCOPY;   Service: Cardiovascular;  Laterality: N/A;   CARDIOVERSION N/A 11/26/2014   Procedure: CARDIOVERSION;  Surgeon: Antoine Poche, MD;  Location: [facility] ORS;  Service: Endoscopy;  Laterality: N/A;   CARDIOVERSION N/A 11/15/2015   Procedure: CARDIOVERSION;  Surgeon: Jake Bathe, MD;  Location: [facility] ENDOSCOPY;  Service: Cardiovascular;  Laterality: N/A;   CARDIOVERSION N/A 08/07/2016   Procedure: CARDIOVERSION;  Surgeon: Laqueta Linden, MD;  Location: [facility] ORS;  Service: Cardiovascular;  Laterality: N/A;   CARDIOVERSION N/A 09/11/2018   Procedure: CARDIOVERSION;  Surgeon: Antoine Poche, MD;  Location: [facility] ORS;  Service: Endoscopy;  Laterality: N/A;   CATARACT EXTRACTION W/PHACO Right 01/18/2014   Procedure: CATARACT EXTRACTION PHACO AND INTRAOCULAR LENS PLACEMENT (IOC);  Surgeon: Gemma Payor, MD;  Location: [facility] ORS;  Service: Ophthalmology;  Laterality: Right;  CDE 9.95   COLONOSCOPY WITH PROPOFOL N/A 08/19/2020   Procedure: COLONOSCOPY WITH PROPOFOL;  Surgeon: Dolores Frame, MD;  [facility];  Service: Gastroenterology;  Laterality: N/A;  8:20   CYSTOSCOPY WITH LITHOLAPAXY N/A 10/06/2019   Procedure: CYSTOSCOPY WITH LASER LITHOLAPAXY;  Surgeon: Rene Paci, MD;  Location: [facility] ORS;  Service: Urology;  Laterality: N/A;   ESOPHAGOGASTRODUODENOSCOPY (EGD) WITH PROPOFOL N/A 08/19/2020   Procedure: ESOPHAGOGASTRODUODENOSCOPY (EGD) WITH PROPOFOL;  Surgeon: Dolores Frame, MD;  [facility];  Service: Gastroenterology;  Laterality: N/A;   EYE SURGERY Right    detached retina   FOOT SURGERY Left    KNEE ARTHROSCOPY Right    LAPAROSCOPIC RIGHT HEMI COLECTOMY N/A 09/30/2020   Procedure: LAPAROSCOPIC RIGHT HEMI COLECTOMY;  Surgeon: Andria Meuse, MD;  Location: [facility] ORS;  Service: General;  Laterality: N/A;   LITHOTRIPSY     for kidney stones 2002   POLYPECTOMY  08/19/2020   Procedure: POLYPECTOMY INTESTINAL;  Surgeon: Dolores Frame,  MD;  [facility];  Service: Gastroenterology;;   POLYPECTOMY  08/19/2020   Procedure: POLYPECTOMY;  Surgeon: Dolores Frame, MD;  [facility];  Service: Gastroenterology;;   ROBOT ASSISTED LAPAROSCOPIC NEPHRECTOMY Left 10/06/2019   Procedure: XI ROBOTIC ASSISTED LAPAROSCOPIC NEPHRECTOMY WITH ADRENALECTOMY;  Surgeon: Rene Paci, MD;  Location: [facility] ORS;  Service: Urology;  Laterality: Left;   SUBMUCOSAL TATTOO INJECTION  08/19/2020   Procedure: SUBMUCOSAL TATTOO INJECTION;  Surgeon: Dolores Frame, MD;  [facility];  Service: Gastroenterology;;   TEE WITHOUT CARDIOVERSION N/A 11/26/2014   Procedure: TRANSESOPHAGEAL ECHOCARDIOGRAM (TEE) WITH PROPOFOL;  Surgeon: Antoine Poche, MD;  Location: [facility];  Service: Endoscopy;  Laterality: N/A;   TEE WITHOUT CARDIOVERSION N/A 11/15/2015   Procedure: TRANSESOPHAGEAL ECHOCARDIOGRAM (TEE);  Surgeon: Jake Bathe, MD;  Location: [facility] ENDOSCOPY;  Service: Cardiovascular;  Laterality: N/A;   TEE WITHOUT CARDIOVERSION N/A 09/11/2018   Procedure: TRANSESOPHAGEAL ECHOCARDIOGRAM (TEE) WITH PROPOFOL;  Surgeon: Antoine Poche, MD;  Location: [facility] OR[facility];  Service: Endoscopy;  Laterality: N/A;   VASECTOMY      SOCIAL HISTORY:  Social History   Socioeconomic History   Marital status: Married  Spouse name: Not on file   Number of children: Not on file   Years of education: Not on file   Highest education level: Not on file  Occupational History   Not on file  Tobacco Use   Smoking status: Never   Smokeless tobacco: Never  Vaping Use   Vaping Use: Never used  Substance and Sexual Activity   Alcohol use: No    Alcohol/week: 0.0 standard drinks   Drug use: No   Sexual activity: Yes    Birth control/protection: Surgical  Other Topics Concern   Not on file  Social History Narrative   Not on file   Social Determinants of Health   Financial Resource Strain: Low Risk    Difficulty of  Paying Living Expenses: Not hard at all  Food Insecurity: No Food Insecurity   Worried About Charity fundraiser in the Last Year: Never true   [place] in the Last Year: Never true  Transportation Needs: No Transportation Needs   Lack of Transportation (Medical): No   Lack of Transportation (Non-Medical): No  Physical Activity: Inactive   Days of Exercise per Week: 0 days   Minutes of Exercise per Session: 0 min  Stress: No Stress Concern Present   Feeling of Stress : Not at all  Social Connections: Moderately Integrated   Frequency of Communication with Friends and Family: More than three times a week   Frequency of Social Gatherings with Friends and Family: More than three times a week   Attends Religious Services: More than 4 times per year   Active Member of [employer] or Organizations: No   Attends Archivist Meetings: Never   Marital Status: Married  Human resources officer Violence: Not At Risk   Fear of Current or Ex-Partner: No   Emotionally Abused: No   Physically Abused: No   Sexually Abused: No    FAMILY HISTORY:  Family History  Problem Relation Age of Onset   Diabetes Mother    Hypertension Mother    Colon cancer Mother        dx. 37s   Diabetes Father    Colon cancer Father        dx. 62s   Lung cancer Maternal Grandmother        smoked   Bone cancer Maternal Grandfather        dx. 80s   Hypertension Other     CURRENT MEDICATIONS:  Current Outpatient Medications  Medication Sig Dispense Refill   amLODipine (NORVASC) 5 MG tablet TAKE 1 TABLET BY MOUTH EVERY DAY 90 tablet 3   atorvastatin (LIPITOR) 20 MG tablet Take 20 mg by mouth every evening.      Bioflavonoid Products (ESTER C PO) Take 1,000 mg by mouth daily.     Calcium Citrate-Vitamin D (CITRACAL + D PO) Take 1 tablet by mouth daily.     capecitabine (XELODA) 500 MG tablet Take 4 tablets (2,000 mg total) by mouth 2 (two) times daily after a meal. Take for 14 days, then hold for 7 days.  Repeat every 21 days. 112 tablet 3   cholecalciferol (VITAMIN D3) 25 MCG (1000 UNIT) tablet Take 1,000 Units by mouth daily.     iron polysaccharides (NIFEREX) 150 MG capsule Take 150 mg by mouth 2 (two) times daily.     losartan (COZAAR) 100 MG tablet Take 100 mg by mouth every evening.      metoprolol tartrate (LOPRESSOR) 100 MG tablet TAKE 1 TABLET BY  MOUTH TWICE A DAY 60 tablet 2   omeprazole (PRILOSEC) 40 MG capsule TAKE 1 CAPSULE BY MOUTH TWICE A DAY 180 capsule 1   OVER THE COUNTER MEDICATION Take 1 tablet by mouth 2 (two) times daily. Glucocil otc supplement     OXALIPLATIN IV Inject into the vein every 21 ( twenty-one) days.     prochlorperazine (COMPAZINE) 10 MG tablet Take 1 tablet (10 mg total) by mouth every 6 (six) hours as needed (Nausea or vomiting). 30 tablet 1   Semaglutide (OZEMPIC, 0.25 OR 0.5 MG/DOSE, [place]) Inject 0.5 mg into the skin every Monday.      Vitamin D, Ergocalciferol, (DRISDOL) 50000 UNITS CAPS capsule Take 50,000 Units by mouth every Wednesday.     warfarin (COUMADIN) 5 MG tablet TAKE ONE TABLET BY MOUTH DAILY OR AS DIRECTED BY COUDAMIN CLINIC 30 tablet 5   zinc gluconate 50 MG tablet Take 50 mg by mouth daily.     No current facility-administered medications for this visit.    ALLERGIES:  Allergies  Allergen Reactions   Ace Inhibitors Cough   Lisinopril Cough   Xarelto [Rivaroxaban] Other (See Comments)    Heart out of rhythm    Quinine Derivatives Rash    PHYSICAL EXAM:  Performance status (ECOG): 1 - Symptomatic but completely ambulatory  There were no vitals filed for this visit. Wt Readings from Last 3 Encounters:  12/20/20 288 lb 6.4 oz (130.8 kg)  12/11/20 275 lb (124.7 kg)  12/07/20 289 lb (131.1 kg)   Physical Exam Vitals reviewed.  Constitutional:      Appearance: Normal appearance.  Cardiovascular:     Rate and Rhythm: Normal rate and regular rhythm.     Pulses: Normal pulses.     Heart sounds: Normal heart sounds.  Pulmonary:      Effort: Pulmonary effort is normal.     Breath sounds: Normal breath sounds.  Musculoskeletal:     Right lower leg: No edema.     Left lower leg: No edema.  Neurological:     General: No focal deficit present.     Mental Status: He is alert and oriented to person, place, and time.  Psychiatric:        Mood and Affect: Mood normal.        Behavior: Behavior normal.    LABORATORY DATA:  I have reviewed the labs as listed.  CBC Latest Ref Rng & Units 12/22/2020 12/20/2020 12/11/2020  WBC 4.0 - 10.5 K/uL 3.6(L) 4.3 2.9(L)  Hemoglobin 13.0 - 17.0 g/dL 11.5(L) 12.2(L) 12.2(L)  Hematocrit 39.0 - 52.0 % 35.4(L) 37.1(L) 38.1(L)  Platelets 150 - 400 K/uL 180 158 169   CMP Latest Ref Rng & Units 12/22/2020 12/20/2020 12/11/2020  Glucose 70 - 99 mg/dL 98 106(H) 118(H)  BUN 8 - 23 mg/dL $Remove'18 13 22  'qsvMzlP$ Creatinine 0.61 - 1.24 mg/dL 1.14 1.23 1.36(H)  Sodium 135 - 145 mmol/L 140 138 137  Potassium 3.5 - 5.1 mmol/L 3.8 4.4 4.2  Chloride 98 - 111 mmol/L 107 107 104  CO2 22 - 32 mmol/L $RemoveB'26 23 25  'wTPmcaGY$ Calcium 8.9 - 10.3 mg/dL 8.9 9.1 9.1  Total Protein 6.5 - 8.1 g/dL 6.8 - 7.2  Total Bilirubin 0.3 - 1.2 mg/dL 1.4(H) - 3.6(H)  Alkaline Phos 38 - 126 U/L 99 - 104  AST 15 - 41 U/L 24 - 33  ALT 0 - 44 U/L 22 - 31    DIAGNOSTIC IMAGING:  I have independently reviewed the scans  and discussed with the patient. DG Chest Port 1 View  Result Date: 12/11/2020 CLINICAL DATA:  Possible AFib episode. EXAM: PORTABLE CHEST 1 VIEW COMPARISON:  Chest radiograph 02/01/2020 FINDINGS: Right upper extremity central venous catheter tip projects at the cavoatrial junction. Stable cardiomediastinal contours. The lungs are clear. No pneumothorax or large pleural effusion. No acute finding in the visualized skeleton. IMPRESSION: No acute cardiopulmonary finding. Electronically Signed   By: Audie Pinto M.D.   On: 12/11/2020 10:44     ASSESSMENT:  1.  Right-sided colon cancer: - He had 3 prior colonoscopies, last one 2  years ago by Dr. Wonda Cheng in [place], with multiple polyps removed. - Colonoscopy by Dr. Jenetta Downer on 08/19/2020 showed fungating infiltrative ulcerated partially obstructing large mass in the proximal ascending colon.  Mass was partially circumferential.  7 sessile polyps found in the transverse colon, ascending colon and cecum. - Pathology consistent with adenocarcinoma of the biopsy of the mass.  Single fragment of at least intramucosal adenocarcinoma in the transverse colon polypectomy.  Pathology report has descending colon as adenocarcinoma. - CT CAP with contrast on 08/26/2020 shows circumferential lesion at the hepatic flexure of the colon consistent with primary colorectal carcinoma with no pathologically enlarged lymph node metastasis.  No liver metastasis.  Status post left nephrectomy.  No evidence of thoracic metastasis. - Even though descending colon polypectomy was reported as adenocarcinoma, Dr. Jenetta Downer felt that it was mislabeled.  Mass was reported in the proximal ascending colon on colonoscopy and close to hepatic flexure on CT scan. - Right hemicolectomy on 09/30/2020 - Pathology PT3PN1B, 2/30 lymph nodes positive, 1 tumor deposit, margins negative, MSI stable no lymphovascular or perineural invasion, grade 2, no perforation. - Cycle 1 of Capron ox on 10/27/2020.  2.  Left kidney renal cell carcinoma: - Incidentally found on work-up for kidney stones. - Left nephrectomy by Dr. Gilford Rile on 10/06/2019 with pathology showing clear-cell renal cell carcinoma, grade 2, 6 cm, margins negative.  1 benign lymph node.  PT1BPN0.  3.  Social/family history: - He is a disabled.  He worked at [employer] and a [employer].  He might have exposed to some chemicals at the later job.  No prior history of smoking. - Family history significant for mother with colon cancer, father with colon cancer and maternal grandfather with bone cancer.   PLAN:  1.  Stage III (PT3BPN1B right-sided  colon cancer: - He has tolerated last cycle reasonably well. - He underwent DC cardioversion on 01/03/2021 for atrial fibrillation. - We reviewed labs from today which were adequate to proceed with cycle 4.  Proceed with oxaliplatin without any dose modifications. - He will start Xeloda later today.  RTC 4 weeks with repeat labs including CEA. - We will consider ordering CT AP in the first week of February 2023.  2.  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  3.  Peripheral neuropathy: - Overall slight worsening of numbness in the feet which is stable.  We will closely monitor.  4.  Iron deficiency state: - Continue iron tablet twice daily.  Hemoglobin is 11.7.   Orders placed this encounter:  No orders of the defined types were placed in this encounter.    Derek Jack, MD [place] [phone]   I, Thana Ates, am acting as a scribe for Dr. Derek Jack.  I, Derek Jack MD, have reviewed the above documentation for accuracy and completeness, and I agree  with the above.     

## 2020-12-29 ENCOUNTER — Encounter (HOSPITAL_COMMUNITY): Payer: Self-pay | Admitting: Hematology

## 2020-12-29 ENCOUNTER — Other Ambulatory Visit: Payer: Self-pay

## 2020-12-29 ENCOUNTER — Telehealth: Payer: Self-pay | Admitting: Cardiology

## 2020-12-29 ENCOUNTER — Inpatient Hospital Stay (HOSPITAL_COMMUNITY): Payer: Medicare HMO

## 2020-12-29 ENCOUNTER — Inpatient Hospital Stay (HOSPITAL_COMMUNITY): Payer: Medicare HMO | Admitting: Hematology

## 2020-12-29 VITALS — BP 137/79 | HR 82 | Temp 97.0°F | Resp 18

## 2020-12-29 DIAGNOSIS — C182 Malignant neoplasm of ascending colon: Secondary | ICD-10-CM

## 2020-12-29 DIAGNOSIS — D649 Anemia, unspecified: Secondary | ICD-10-CM

## 2020-12-29 DIAGNOSIS — Z5111 Encounter for antineoplastic chemotherapy: Secondary | ICD-10-CM | POA: Diagnosis not present

## 2020-12-29 LAB — CBC WITH DIFFERENTIAL/PLATELET
Abs Immature Granulocytes: 0.02 10*3/uL (ref 0.00–0.07)
Basophils Absolute: 0 10*3/uL (ref 0.0–0.1)
Basophils Relative: 1 %
Eosinophils Absolute: 0.1 10*3/uL (ref 0.0–0.5)
Eosinophils Relative: 1 %
HCT: 36 % — ABNORMAL LOW (ref 39.0–52.0)
Hemoglobin: 11.7 g/dL — ABNORMAL LOW (ref 13.0–17.0)
Immature Granulocytes: 0 %
Lymphocytes Relative: 18 %
Lymphs Abs: 0.8 10*3/uL (ref 0.7–4.0)
MCH: 28.4 pg (ref 26.0–34.0)
MCHC: 32.5 g/dL (ref 30.0–36.0)
MCV: 87.4 fL (ref 80.0–100.0)
Monocytes Absolute: 0.8 10*3/uL (ref 0.1–1.0)
Monocytes Relative: 16 %
Neutro Abs: 2.9 10*3/uL (ref 1.7–7.7)
Neutrophils Relative %: 64 %
Platelets: 208 10*3/uL (ref 150–400)
RBC: 4.12 MIL/uL — ABNORMAL LOW (ref 4.22–5.81)
RDW: 24.5 % — ABNORMAL HIGH (ref 11.5–15.5)
WBC: 4.6 10*3/uL (ref 4.0–10.5)
nRBC: 0 % (ref 0.0–0.2)

## 2020-12-29 LAB — COMPREHENSIVE METABOLIC PANEL
ALT: 19 U/L (ref 0–44)
AST: 21 U/L (ref 15–41)
Albumin: 3.8 g/dL (ref 3.5–5.0)
Alkaline Phosphatase: 112 U/L (ref 38–126)
Anion gap: 7 (ref 5–15)
BUN: 18 mg/dL (ref 8–23)
CO2: 27 mmol/L (ref 22–32)
Calcium: 8.8 mg/dL — ABNORMAL LOW (ref 8.9–10.3)
Chloride: 106 mmol/L (ref 98–111)
Creatinine, Ser: 1.24 mg/dL (ref 0.61–1.24)
GFR, Estimated: >60 mL/min (ref >60)
Glucose, Bld: 100 mg/dL — ABNORMAL HIGH (ref 70–99)
Potassium: 4 mmol/L (ref 3.5–5.1)
Sodium: 140 mmol/L (ref 135–145)
Total Bilirubin: 1.4 mg/dL — ABNORMAL HIGH (ref 0.3–1.2)
Total Protein: 7 g/dL (ref 6.5–8.1)

## 2020-12-29 LAB — MAGNESIUM: Magnesium: 1.8 mg/dL (ref 1.7–2.4)

## 2020-12-29 MED ORDER — DEXTROSE 5 % IV SOLN: Freq: Once | INTRAVENOUS | Status: AC

## 2020-12-29 MED ORDER — SODIUM CHLORIDE 0.9 % IV SOLN
10.0000 mg | Freq: Once | INTRAVENOUS | Status: AC
  Administered 2020-12-29: 10 mg via INTRAVENOUS
  Filled 2020-12-29: qty 10

## 2020-12-29 MED ORDER — PALONOSETRON HCL INJECTION 0.25 MG/5ML
0.2500 mg | Freq: Once | INTRAVENOUS | Status: AC
  Administered 2020-12-29: 0.25 mg via INTRAVENOUS
  Filled 2020-12-29: qty 5

## 2020-12-29 MED ORDER — OXALIPLATIN CHEMO INJECTION 100 MG/20ML
130.0000 mg/m2 | Freq: Once | INTRAVENOUS | Status: AC
  Administered 2020-12-29: 360 mg via INTRAVENOUS
  Filled 2020-12-29: qty 72

## 2020-12-29 NOTE — Patient Instructions (Signed)
[facility]  Discharge Instructions: Thank you for choosing [facility] to provide your oncology and hematology care.  If you have a lab appointment with the [facility], please come in thru the Main Entrance and check in at the main information desk.  Wear comfortable clothing and clothing appropriate for easy access to any Portacath or PICC line.   We strive to give you quality time with your provider. You may need to reschedule your appointment if you arrive late (15 or more minutes).  Arriving late affects you and other patients whose appointments are after yours.  Also, if you miss three or more appointments without notifying the office, you may be dismissed from the clinic at the provider's discretion.      For prescription refill requests, have your pharmacy contact our office and allow 72 hours for refills to be completed.    Today you received the following chemotherapy and/or immunotherapy agents Oxaliplatin.    To help prevent nausea and vomiting after your treatment, we encourage you to take your nausea medication as directed. PICC Removal, Adult, Care After The following information offers guidance on how to care for yourself after your procedure. Your health care provider may also give you more specific instructions. If you have problems or questions, contact your health care provider. What can I expect after the procedure? After the procedure, it is common to have: Tenderness or soreness. Redness, swelling, or a scab at the place where your PICC was removed (exit site). Follow these instructions at home: For the first 24 hours after the procedure: Keep the bandage (dressing) on your exit site clean and dry. Do not remove your dressing until your health care provider tells you to do so. Do not lift anything heavy or do activities that require great effort until your health care provider says it is okay. You should avoid: Lifting weights. Doing yard  work. Doing any physical activity with repetitive arm movement. Watch closely for any signs of an air bubble in the vein (air embolism). This is a rare but serious complication. Signs of an air embolism include trouble breathing, wheezing, chest pain, or a fast pulse. If you have signs of an air embolism, call 911 right away and lie down on your left side to keep the air from moving into your lungs. After 24 hours have passed:  Remove your dressing as told by your health care provider. Wash your hands with soap and water for at least 20 seconds before and after you change your dressing. If soap and water are not available, use hand sanitizer. Return to your normal activities as told by your health care provider. A small scab may develop over the exit site. Do not pick at the scab. When bathing or showering, gently wash the exit site with soap and water. Pat it dry. Watch for signs of infection, such as: A fever or chills. Swollen glands under your arm. More redness, swelling, or soreness around your arm. Blood, fluid, or pus coming from your exit site. Warmth or a bad smell coming from your exit site. A red streak spreading away from your exit site. General instructions Take over-the-counter and prescription medicines only as told by your health care provider. Do not take any new medicines without checking with your health care provider first. If you were given an antibiotic ointment, apply it as told by your health care provider. Keep all follow-up visits. This is important. Contact a health care provider if:  You have a fever or chills. You have swelling at your exit site or swollen glands under your arm. You have signs of infection at your exit site. You have soreness, redness, or swelling in your arm that gets worse. Get help right away if: You have numbness or tingling in your fingers, hand, or arm. Your arm looks blue and feels cold. You have signs of an air embolism, such as  trouble breathing, wheezing, chest pain, or a fast pulse. These symptoms may be an emergency. Get medical help right away. Call 911. Do not wait to see if the symptoms will go away. Do not drive yourself to the hospital. Summary After a PICC is removed, it is common to have tenderness or soreness, redness, swelling, or a scab at the exit site. Keep the bandage (dressing) over the exit site clean and dry. Do not remove the dressing until your health care provider tells you to do so. Do not lift anything heavy or do activities that require great effort until your health care provider says it is okay. Watch closely for any signs of an air bubble (air embolism). If you have signs of an air embolism, call 911 right away and lie down on your left side. This information is not intended to replace advice given to you by your health care provider. Make sure you discuss any questions you have with your health care provider. Document Revised: 07/27/2020 Document Reviewed: 07/27/2020 Elsevier Patient Education  [place] ARE SYMPTOMS THAT SHOULD BE REPORTED IMMEDIATELY: *FEVER GREATER THAN 100.4 F (38 C) OR HIGHER *CHILLS OR SWEATING *NAUSEA AND VOMITING THAT IS NOT CONTROLLED WITH YOUR NAUSEA MEDICATION *UNUSUAL SHORTNESS OF BREATH *UNUSUAL BRUISING OR BLEEDING *URINARY PROBLEMS (pain or burning when urinating, or frequent urination) *BOWEL PROBLEMS (unusual diarrhea, constipation, pain near the anus) TENDERNESS IN MOUTH AND THROAT WITH OR WITHOUT PRESENCE OF ULCERS (sore throat, sores in mouth, or a toothache) UNUSUAL RASH, SWELLING OR PAIN  UNUSUAL VAGINAL DISCHARGE OR ITCHING   Items with * indicate a potential emergency and should be followed up as soon as possible or go to the Emergency Department if any problems should occur.  Please show the CHEMOTHERAPY ALERT CARD or IMMUNOTHERAPY ALERT CARD at check-in to the Emergency Department and triage nurse.  Should you have  questions after your visit or need to cancel or reschedule your appointment, please contact [facility] [phone]  and follow the prompts.  Office hours are 8:00 a.m. to 4:30 p.m. Monday - Friday. Please note that voicemails left after 4:00 p.m. may not be returned until the following business day.  We are closed weekends and major holidays. You have access to a nurse at all times for urgent questions. Please call the main number to the clinic [phone] and follow the prompts.  For any non-urgent questions, you may also contact your provider using MyChart. We now offer e-Visits for anyone 70 and older to request care online for non-urgent symptoms. For details visit [email or web address].   Also download the MyChart app! Go to the app store, search "MyChart", open the app, select [facility], and log in with your MyChart username and password.  Due to Covid, a mask is required upon entering the hospital/clinic. If you do not have a mask, one will be given to you upon arrival. For doctor visits, patients may have 1 support person aged 26 or older with them. For treatment visits, patients cannot have anyone with them  due to current Covid guidelines and our immunocompromised population.

## 2020-12-29 NOTE — Telephone Encounter (Signed)
Spoke with pt.  Will keep INR appt on 12/13 day of TEE/DCCV and cancel appt on 12/12.  Pt verbalized understanding.

## 2020-12-29 NOTE — Progress Notes (Signed)
Pt presents today for Oxaliplatin per provider's order Vital sign stable and pt voiced no new complaints at this time. Okay to proceed with treatment today per Dr.K.Pt's last oxaliplatin infusion today. PICC line to be removed after treatment today per Dr.K  Oxaplatin given today per MD orders. Tolerated infusion without adverse affects. Vital signs stable. No complaints at this time. Discharged from clinic ambulatory in stable condition. Alert and oriented x 3. F/U with [facility] as scheduled.    PICC Removal Note: S: PICC line O: PICC line removed from right antecubital after sterile site prepped per protocol. PICC catheter tip visualized and intact. Pressure dressing applied with Vaseline guaze covered with 4x4 guaze and tape A: No redness, ecchymosis, edema, swelling, or drainage noted at site. P: Instructions provided on post PICC discharge care, including followup notification instructions.

## 2020-12-29 NOTE — Telephone Encounter (Signed)
Patient calling to see if both appts on 12th and 13th is needed. Please advise

## 2020-12-29 NOTE — Progress Notes (Signed)
Patient is taking Xeloda as prescribed.  He has not missed any doses and reports no side effects at this time.    Patient has been examined, vital signs and labs have been reviewed by Dr. Delton Coombes. ANC, Creatinine, LFTs, hemoglobin, and platelets are within treatment parameters per Dr. Delton Coombes. Patient may proceed with treatment per M.D.

## 2020-12-29 NOTE — Patient Instructions (Signed)
[place] at [facility] Discharge Instructions   You were seen and examined today by Dr. Delton Coombes. We will proceed with you last oxaliplatin infusion today.  Return as scheduled for lab work and office visit.    Thank you for choosing [place] at [facility] to provide your oncology and hematology care.  To afford each patient quality time with our provider, please arrive at least 15 minutes before your scheduled appointment time.   If you have a lab appointment with the [facility] please come in thru the Main Entrance and check in at the main information desk.  You need to re-schedule your appointment should you arrive 10 or more minutes late.  We strive to give you quality time with our providers, and arriving late affects you and other patients whose appointments are after yours.  Also, if you no show three or more times for appointments you may be dismissed from the clinic at the providers discretion.     Again, thank you for choosing [facility].  Our hope is that these requests will decrease the amount of time that you wait before being seen by our physicians.       _____________________________________________________________  Should you have questions after your visit to [facility], please contact our office at [phone] and follow the prompts.  Our office hours are 8:00 a.m. and 4:30 p.m. Monday - Friday.  Please note that voicemails left after 4:00 p.m. may not be returned until the following business day.  We are closed weekends and major holidays.  You do have access to a nurse 24-7, just call the main number to the clinic [phone] and do not press any options, hold on the line and a nurse will answer the phone.    For prescription refill requests, have your pharmacy contact our office and allow 72 hours.    Due to Covid, you will need to wear a mask upon entering the hospital. If you do not  have a mask, a mask will be given to you at the Main Entrance upon arrival. For doctor visits, patients may have 1 support person age 37 or older with them. For treatment visits, patients can not have anyone with them due to social distancing guidelines and our immunocompromised population.

## 2021-01-03 ENCOUNTER — Ambulatory Visit (HOSPITAL_COMMUNITY): Payer: Medicare HMO | Admitting: General Practice

## 2021-01-03 ENCOUNTER — Other Ambulatory Visit: Payer: Self-pay

## 2021-01-03 ENCOUNTER — Ambulatory Visit (HOSPITAL_COMMUNITY)
Admission: RE | Admit: 2021-01-03 | Discharge: 2021-01-03 | Disposition: A | Discharge status: Home / Self Care | Payer: Medicare HMO | Attending: Cardiology | Admitting: Cardiology

## 2021-01-03 ENCOUNTER — Encounter (HOSPITAL_COMMUNITY): Payer: Self-pay | Admitting: Cardiology

## 2021-01-03 ENCOUNTER — Ambulatory Visit (HOSPITAL_BASED_OUTPATIENT_CLINIC_OR_DEPARTMENT_OTHER)
Admission: RE | Admit: 2021-01-03 | Discharge: 2021-01-03 | Disposition: A | Discharge status: Home / Self Care | Payer: Medicare HMO | Source: Ambulatory Visit | Attending: Physician Assistant | Admitting: Physician Assistant

## 2021-01-03 ENCOUNTER — Ambulatory Visit (INDEPENDENT_AMBULATORY_CARE_PROVIDER_SITE_OTHER): Payer: Medicare HMO | Admitting: *Deleted

## 2021-01-03 ENCOUNTER — Encounter (HOSPITAL_COMMUNITY)
Admission: RE | Disposition: A | Discharge status: Home / Self Care | Payer: Self-pay | Source: Home / Self Care | Attending: Cardiology

## 2021-01-03 DIAGNOSIS — I4891 Unspecified atrial fibrillation: Secondary | ICD-10-CM

## 2021-01-03 DIAGNOSIS — C189 Malignant neoplasm of colon, unspecified: Secondary | ICD-10-CM | POA: Diagnosis not present

## 2021-01-03 DIAGNOSIS — D509 Iron deficiency anemia, unspecified: Secondary | ICD-10-CM | POA: Diagnosis not present

## 2021-01-03 DIAGNOSIS — I340 Nonrheumatic mitral (valve) insufficiency: Secondary | ICD-10-CM | POA: Diagnosis not present

## 2021-01-03 DIAGNOSIS — D6869 Other thrombophilia: Secondary | ICD-10-CM | POA: Insufficient documentation

## 2021-01-03 DIAGNOSIS — E785 Hyperlipidemia, unspecified: Secondary | ICD-10-CM | POA: Insufficient documentation

## 2021-01-03 DIAGNOSIS — E119 Type 2 diabetes mellitus without complications: Secondary | ICD-10-CM | POA: Diagnosis not present

## 2021-01-03 DIAGNOSIS — Z6831 Body mass index (BMI) 31.0-31.9, adult: Secondary | ICD-10-CM | POA: Insufficient documentation

## 2021-01-03 DIAGNOSIS — Z5181 Encounter for therapeutic drug level monitoring: Secondary | ICD-10-CM

## 2021-01-03 DIAGNOSIS — I10 Essential (primary) hypertension: Secondary | ICD-10-CM | POA: Diagnosis not present

## 2021-01-03 DIAGNOSIS — I4819 Other persistent atrial fibrillation: Secondary | ICD-10-CM | POA: Insufficient documentation

## 2021-01-03 DIAGNOSIS — E669 Obesity, unspecified: Secondary | ICD-10-CM | POA: Insufficient documentation

## 2021-01-03 DIAGNOSIS — F419 Anxiety disorder, unspecified: Secondary | ICD-10-CM | POA: Diagnosis not present

## 2021-01-03 DIAGNOSIS — I480 Paroxysmal atrial fibrillation: Secondary | ICD-10-CM

## 2021-01-03 HISTORY — PX: CARDIOVERSION: SHX1299

## 2021-01-03 HISTORY — PX: TEE WITHOUT CARDIOVERSION: SHX5443

## 2021-01-03 LAB — GLUCOSE, CAPILLARY: Glucose-Capillary: 111 mg/dL — ABNORMAL HIGH (ref 70–99)

## 2021-01-03 LAB — POCT INR: INR: 3.3 — AB (ref 2.0–3.0)

## 2021-01-03 SURGERY — ECHOCARDIOGRAM, TRANSESOPHAGEAL
Anesthesia: General

## 2021-01-03 MED ORDER — PROPOFOL 500 MG/50ML IV EMUL
INTRAVENOUS | Status: DC | PRN
  Administered 2021-01-03: 100 ug/kg/min via INTRAVENOUS

## 2021-01-03 MED ORDER — PROPOFOL 10 MG/ML IV BOLUS
INTRAVENOUS | Status: DC | PRN
  Administered 2021-01-03: 30 mg via INTRAVENOUS
  Administered 2021-01-03: 20 mg via INTRAVENOUS

## 2021-01-03 MED ORDER — PHENYLEPHRINE 40 MCG/ML (10ML) SYRINGE FOR IV PUSH (FOR BLOOD PRESSURE SUPPORT)
PREFILLED_SYRINGE | INTRAVENOUS | Status: DC | PRN
  Administered 2021-01-03: 80 ug via INTRAVENOUS

## 2021-01-03 MED ORDER — SODIUM CHLORIDE 0.9 % IV SOLN: INTRAVENOUS | Status: DC

## 2021-01-03 MED ORDER — LIDOCAINE 2% (20 MG/ML) 5 ML SYRINGE
INTRAMUSCULAR | Status: DC | PRN
  Administered 2021-01-03: 40 mg via INTRAVENOUS

## 2021-01-03 NOTE — CV Procedure (Signed)
° °  TRANSESOPHAGEAL ECHOCARDIOGRAM GUIDED DIRECT CURRENT CARDIOVERSION  NAME:  Bradley Rodriguez    MRN: [id number] DOB:  Jan 28, 1960    ADMIT DATE: 01/03/2021  INDICATIONS: Symptomatic atrial fibrillation  PROCEDURE:   Informed consent was obtained prior to the procedure. The risks, benefits and alternatives for the procedure were discussed and the patient comprehended these risks.  Risks include, but are not limited to, cough, sore throat, vomiting, nausea, somnolence, esophageal and stomach trauma or perforation, bleeding, low blood pressure, aspiration, pneumonia, infection, trauma to the teeth and death.    After a procedural time-out, the oropharynx was anesthetized and the patient was sedated by the anesthesia service. The transesophageal probe was inserted in the esophagus and stomach without difficulty and multiple views were obtained. Anesthesia was monitored by the anesthesiology team.  COMPLICATIONS:    Complications: No complications Patient tolerated procedure well.  TEE KEY FINDINGS:  EF 55-60%,No left atrial appendage clot visualized. Mild mitral regurgitation present. Pulmonary vein visualzied. Full Report to follow.   CARDIOVERSION:     Indications:  Symptomatic Atrial Fibrillation  Procedure Details:  Once the TEE was complete, the patient had the defibrillator pads placed in the anterior and posterior position. Once an appropriate level of sedation was confirmed, the patient was cardioverted x 2  with 200J of biphasic synchronized energy.  The patient converted to NSR.  There were no apparent complications.  The patient had normal neuro status and respiratory status post procedure with vitals stable as recorded elsewhere.  Adequate airway was maintained throughout and vital signs monitored per protocol.  The was advised to continue anticoagulation for minimum 4 week with interruption.   All questions has been answered at this time.  Berniece Salines, DO [place]  12:59 PM

## 2021-01-03 NOTE — Anesthesia Procedure Notes (Signed)
Procedure Name: MAC Date/Time: 01/03/2021 12:32 PM Performed by: Ardyth Harps, CRNA Pre-anesthesia Checklist: Patient identified, Patient being monitored, Timeout performed, Suction available and Emergency Drugs available Patient Re-evaluated:Patient Re-evaluated prior to induction Oxygen Delivery Method: Simple face mask Preoxygenation: Pre-oxygenation with 100% oxygen Dental Injury: Teeth and Oropharynx as per pre-operative assessment

## 2021-01-03 NOTE — Patient Instructions (Signed)
Pending TEE/DCCV on 01/03/21 Take warfarin 1 tablets tonight then decrease dose to 1 tablet daily except 1 1/2 tablets on Sundays, Tuesdays and Thursdays Recheck INR in 1 week. Coumadin Clinic [phone] Pt is on Xeloda (chemo) Pt is also getting chemo infusions. Last infusion is on 12/8

## 2021-01-03 NOTE — Transfer of Care (Signed)
Immediate Anesthesia Transfer of Care Note  Patient: Bradley Rodriguez  Procedure(s) Performed: TRANSESOPHAGEAL ECHOCARDIOGRAM (TEE) CARDIOVERSION  Patient Location: Endoscopy Unit  Anesthesia Type:MAC  Level of Consciousness: awake and drowsy  Airway & Oxygen Therapy: Patient Spontanous Breathing and Patient connected to nasal cannula oxygen  Post-op Assessment: Report given to RN and Post -op Vital signs reviewed and stable  Post vital signs: Reviewed and stable  Last Vitals:  Vitals Value Taken Time  BP    Temp    Pulse    Resp    SpO2      Last Pain:  Vitals:   01/03/21 1114  PainSc: 0-No pain         Complications: No notable events documented.

## 2021-01-03 NOTE — Anesthesia Preprocedure Evaluation (Signed)
Anesthesia Evaluation  Patient identified by MRN, date of birth, ID band Patient awake    Reviewed: Allergy & Precautions, NPO status , Patient's Chart, lab work & pertinent test results  Airway Mallampati: I  TM Distance: >3 FB Neck ROM: Full    Dental  (+) Edentulous Upper, Edentulous Lower, Dental Advisory Given   Pulmonary neg pulmonary ROS,    breath sounds clear to auscultation       Cardiovascular hypertension, Pt. on medications (-) angina(-) Past MI + dysrhythmias Atrial Fibrillation  Rhythm:Irregular   1. The left ventricle has normal systolic function, with an ejection  fraction of 55-60%. The cavity size was normal.  2. Left atrial size was Left atrium is enlarged.  3. No evidence of intracardiac thrombus. Normal LA appendage without  thrombus, normal emptying velocity 70cm/s.  4. Mitral valve regurgitation is mild to moderate by color flow Doppler.  5. The aortic valve is tricuspid No stenosis of the aortic valve.  6. The aortic root is normal in size and structure.    Neuro/Psych PSYCHIATRIC DISORDERS Anxiety negative neurological ROS     GI/Hepatic Neg liver ROS,   Endo/Other  diabetes  Renal/GU Renal diseaseS/p left nephrectomy  Lab Results      Component                Value               Date                      CREATININE               1.24                12/29/2020                Musculoskeletal   Abdominal   Peds  Hematology Lab Results      Component                Value               Date                      INR                      3.3 (A)             01/03/2021                INR                      1.9 (A)             12/27/2020                INR                      1.7 (A)             12/20/2020           Coumadin Lab Results      Component                Value               Date                      WBC  4.6                 12/29/2020                 HGB                      11.7 (L)            12/29/2020                HCT                      36.0 (L)            12/29/2020                MCV                      87.4                12/29/2020                PLT                      208                 12/29/2020              Anesthesia Other Findings Happy Birthday  Reproductive/Obstetrics                             Anesthesia Physical Anesthesia Plan  ASA: 2  Anesthesia Plan: MAC and General   Post-op Pain Management:    Induction: Intravenous  PONV Risk Score and Plan: 2 and Propofol infusion and Treatment may vary due to age or medical condition  Airway Management Planned: Nasal Cannula  Additional Equipment: None  Intra-op Plan:   Post-operative Plan:   Informed Consent: I have reviewed the patients History and Physical, chart, labs and discussed the procedure including the risks, benefits and alternatives for the proposed anesthesia with the patient or authorized representative who has indicated his/her understanding and acceptance.     Dental advisory given  Plan Discussed with: Anesthesiologist and CRNA  Anesthesia Plan Comments:         Anesthesia Quick Evaluation

## 2021-01-03 NOTE — Interval H&P Note (Signed)
History and Physical Interval Note:  01/03/2021 12:24 PM  Bradley Rodriguez  has presented today for surgery, with the diagnosis of A-FIB.  The various methods of treatment have been discussed with the patient and family. After consideration of risks, benefits and other options for treatment, the patient has consented to  Procedure(s): TRANSESOPHAGEAL ECHOCARDIOGRAM (TEE) (N/A) CARDIOVERSION (N/A) as a surgical intervention.  The patient's history has been reviewed, patient examined, no change in status, stable for surgery.  I have reviewed the patient's chart and labs.  Questions were answered to the patient's satisfaction.     Demetrice Amstutz

## 2021-01-03 NOTE — Discharge Instructions (Signed)

## 2021-01-03 NOTE — Anesthesia Postprocedure Evaluation (Signed)
Anesthesia Post Note  Patient: Bradley Rodriguez  Procedure(s) Performed: TRANSESOPHAGEAL ECHOCARDIOGRAM (TEE) CARDIOVERSION     Patient location during evaluation: PACU Anesthesia Type: General Level of consciousness: awake and alert, awake and oriented Pain management: pain level controlled Vital Signs Assessment: post-procedure vital signs reviewed and stable Respiratory status: spontaneous breathing, nonlabored ventilation and respiratory function stable Cardiovascular status: blood pressure returned to baseline and stable Postop Assessment: no apparent nausea or vomiting Anesthetic complications: no   No notable events documented.  Last Vitals:  Vitals:   01/03/21 1310 01/03/21 1320  BP: 104/72 120/77  Pulse: 60   Resp: 20   Temp:    SpO2:      Last Pain:  Vitals:   01/03/21 1320  TempSrc:   PainSc: 0-No pain                 Catalina Gravel

## 2021-01-05 ENCOUNTER — Encounter (HOSPITAL_COMMUNITY): Payer: Self-pay | Admitting: Cardiology

## 2021-01-06 ENCOUNTER — Other Ambulatory Visit (HOSPITAL_COMMUNITY): Payer: Self-pay

## 2021-01-10 ENCOUNTER — Ambulatory Visit (HOSPITAL_COMMUNITY)
Admission: RE | Admit: 2021-01-10 | Discharge: 2021-01-10 | Disposition: A | Discharge status: Home / Self Care | Payer: Medicare HMO | Source: Ambulatory Visit | Attending: Nurse Practitioner | Admitting: Nurse Practitioner

## 2021-01-10 ENCOUNTER — Other Ambulatory Visit: Payer: Self-pay

## 2021-01-10 ENCOUNTER — Encounter (HOSPITAL_COMMUNITY): Payer: Self-pay | Admitting: Nurse Practitioner

## 2021-01-10 VITALS — BP 152/74 | HR 61 | Ht >= 80 in | Wt 293.2 lb

## 2021-01-10 DIAGNOSIS — Z6832 Body mass index (BMI) 32.0-32.9, adult: Secondary | ICD-10-CM | POA: Insufficient documentation

## 2021-01-10 DIAGNOSIS — D6869 Other thrombophilia: Secondary | ICD-10-CM | POA: Diagnosis not present

## 2021-01-10 DIAGNOSIS — Z7901 Long term (current) use of anticoagulants: Secondary | ICD-10-CM | POA: Diagnosis not present

## 2021-01-10 DIAGNOSIS — E785 Hyperlipidemia, unspecified: Secondary | ICD-10-CM | POA: Insufficient documentation

## 2021-01-10 DIAGNOSIS — E119 Type 2 diabetes mellitus without complications: Secondary | ICD-10-CM | POA: Diagnosis not present

## 2021-01-10 DIAGNOSIS — Z79899 Other long term (current) drug therapy: Secondary | ICD-10-CM | POA: Diagnosis not present

## 2021-01-10 DIAGNOSIS — E669 Obesity, unspecified: Secondary | ICD-10-CM | POA: Diagnosis not present

## 2021-01-10 DIAGNOSIS — I10 Essential (primary) hypertension: Secondary | ICD-10-CM | POA: Insufficient documentation

## 2021-01-10 DIAGNOSIS — I4819 Other persistent atrial fibrillation: Secondary | ICD-10-CM | POA: Insufficient documentation

## 2021-01-10 DIAGNOSIS — Z85038 Personal history of other malignant neoplasm of large intestine: Secondary | ICD-10-CM | POA: Diagnosis not present

## 2021-01-10 MED ORDER — METOPROLOL TARTRATE 100 MG PO TABS: 100.0000 mg | ORAL_TABLET | Freq: Two times a day (BID) | ORAL | 3 refills | Status: DC

## 2021-01-10 NOTE — Progress Notes (Signed)
Primary Care Physician: Caryl Bis, MD Primary Cardiologist: Dr Harl Bowie Primary Electrophysiologist: none Referring Physician: [facility]   Bradley Rodriguez is a 60 y.o. male with a history of HTN, HLD, DM, colon cancer, atrial fibrillation who presents for consultation in the [facility]. The patient was initially diagnosed with atrial fibrillation in 1996. He has required several cardioversions over the years. He was seen at the ED 12/11/20 with palpitations. ECG showed rate controlled afib. Patient was started on warfarin for a CHADS2VASC score of 2. Per history in chart, he did not tolerate Eliquis or Xarelto. He is symptomatic with palpitations. His INR today was 1.7.   F/u in afib clinic, s/p successful  TEE guided cardioversion, 01/03/21. He feels improved. He remains in SR.   Today, he denies symptoms of chest pain, shortness of breath, orthopnea, PND, lower extremity edema, dizziness, presyncope, syncope, snoring, daytime somnolence, bleeding, or neurologic sequela. The patient is tolerating medications without difficulties and is otherwise without complaint today.    Atrial Fibrillation Risk Factors:  he does not have symptoms or diagnosis of sleep apnea. he does not have a history of rheumatic fever. he does not have a history of alcohol use. The patient does not have a history of early familial atrial fibrillation or other arrhythmias.  he has a BMI of Body mass index is 32.21 kg/m.Marland Kitchen Filed Weights   01/10/21 0911  Weight: 133 kg    Family History  Problem Relation Age of Onset   Diabetes Mother    Hypertension Mother    Colon cancer Mother        dx. 81s   Diabetes Father    Colon cancer Father        dx. 49s   Lung cancer Maternal Grandmother        smoked   Bone cancer Maternal Grandfather        dx. 80s   Hypertension Other      Atrial Fibrillation Management history:  Previous antiarrhythmic drugs: flecainide   Previous cardioversions: 2014, 2016, 2017, 2018, 2020 Previous ablations: none CHADS2VASC score: 2 Anticoagulation history: Xarelto, Eliquis, warfarin    Past Medical History:  Diagnosis Date   A-fib [facility])    DCCV 2009, 2012, 2014, Nov 2016   Anxiety    Arthritis    Knees, wrist, ankles   Cancer ([place]) 07/2019   Kidney   Colon cancer ([place])    Diabetes mellitus without complication ([place])    Dysrhythmia 1996   A- Fib   GERD (gastroesophageal reflux disease)    History of kidney stones    HX: anticoagulation    very remotely and briefly on COumadin aound one of his CV, 2014 on Eliquis had hematuria with renal calculi and stopped   Hyperlipidemia    patient denies this   Hypertension    IBS (irritable bowel syndrome)    Normal cardiac stress test    Normal nuclear stress test , 2001   Past Surgical History:  Procedure Laterality Date   BIOPSY  08/19/2020   Procedure: BIOPSY;  Surgeon: Harvel Quale, MD;  [facility];  Service: Gastroenterology;;   CARDIOVERSION N/A 10/15/2012   Procedure: CARDIOVERSION;  Surgeon: Larey Dresser, MD;  Location: [place];  Service: Cardiovascular;  Laterality: N/A;   CARDIOVERSION N/A 11/26/2014   Procedure: CARDIOVERSION;  Surgeon: Arnoldo Lenis, MD;  [facility]: [facility];  Service: Endoscopy;  Laterality: N/A;   CARDIOVERSION N/A 11/15/2015  Procedure: CARDIOVERSION;  Surgeon: Jerline Pain, MD;  Location: [facility] END[facility]COPY;  Service: Cardiovascular;  Laterality: N/A;   CARDIOVERSION N/A 08/07/2016   Procedure: CARDIOVERSION;  Surgeon: Herminio Commons, MD;  [facility]: [facility];  Service: Cardiovascular;  Laterality: N/A;   CARDIOVERSION N/A 09/11/2018   Procedure: CARDIOVERSION;  Surgeon: Arnoldo Lenis, MD;  [facility]: [facility];  Service: Endoscopy;  Laterality: N/A;   CARDIOVERSION N/A 01/03/2021   Procedure: CARDIOVERSION;  Surgeon: Berniece Salines, DO;  Location: [place];  Service: Cardiovascular;  Laterality:  N/A;   CATARACT EXTRACTION W/PHACO Right 01/18/2014   Procedure: CATARACT EXTRACTION PHACO AND INTRAOCULAR LENS PLACEMENT (IOC);  Surgeon: Tonny Branch, MD;  Location: [facility];  Service: Ophthalmology;  Laterality: Right;  CDE 9.95   COLONOSCOPY WITH PROPOFOL N/A 08/19/2020   Procedure: COLONOSCOPY WITH PROPOFOL;  Surgeon: Harvel Quale, MD;  [facility];  Service: Gastroenterology;  Laterality: N/A;  8:20   CYSTOSCOPY WITH LITHOLAPAXY N/A 10/06/2019   Procedure: CYSTOSCOPY WITH LASER LITHOLAPAXY;  Surgeon: Ceasar Mons, MD;  [facility]: [facility];  Service: Urology;  Laterality: N/A;   ESOPHAGOGASTRODUODENOSCOPY (EGD) WITH PROPOFOL N/A 08/19/2020   Procedure: ESOPHAGOGASTRODUODENOSCOPY (EGD) WITH PROPOFOL;  Surgeon: Harvel Quale, MD;  [facility];  Service: Gastroenterology;  Laterality: N/A;   EYE SURGERY Right    detached retina   FOOT SURGERY Left    KNEE ARTHROSCOPY Right    LAPAROSCOPIC RIGHT HEMI COLECTOMY N/A 09/30/2020   Procedure: LAPAROSCOPIC RIGHT HEMI COLECTOMY;  Surgeon: Ileana Roup, MD;  Location: [facility];  Service: General;  Laterality: N/A;   LITHOTRIPSY     for kidney stones 2002   POLYPECTOMY  08/19/2020   Procedure: POLYPECTOMY INTESTINAL;  Surgeon: Harvel Quale, MD;  [facility];  Service: Gastroenterology;;   POLYPECTOMY  08/19/2020   Procedure: POLYPECTOMY;  Surgeon: Harvel Quale, MD;  [facility];  Service: Gastroenterology;;   ROBOT ASSISTED LAPAROSCOPIC NEPHRECTOMY Left 10/06/2019   Procedure: XI ROBOTIC ASSISTED LAPAROSCOPIC NEPHRECTOMY WITH ADRENALECTOMY;  Surgeon: Ceasar Mons, MD;  [facility];  Service: Urology;  Laterality: Left;   SUBMUCOSAL TATTOO INJECTION  08/19/2020   Procedure: SUBMUCOSAL TATTOO INJECTION;  Surgeon: Harvel Quale, MD;  [facility];  Service: Gastroenterology;;   TEE WITHOUT CARDIOVERSION N/A  11/26/2014   Procedure: TRANSESOPHAGEAL ECHOCARDIOGRAM (TEE) WITH PROPOFOL;  Surgeon: Arnoldo Lenis, MD;  [facility];  Service: Endoscopy;  Laterality: N/A;   TEE WITHOUT CARDIOVERSION N/A 11/15/2015   Procedure: TRANSESOPHAGEAL ECHOCARDIOGRAM (TEE);  Surgeon: Jerline Pain, MD;  Location: [place];  Service: Cardiovascular;  Laterality: N/A;   TEE WITHOUT CARDIOVERSION N/A 09/11/2018   Procedure: TRANSESOPHAGEAL ECHOCARDIOGRAM (TEE) WITH PROPOFOL;  Surgeon: Arnoldo Lenis, MD;  [facility];  Service: Endoscopy;  Laterality: N/A;   TEE WITHOUT CARDIOVERSION N/A 01/03/2021   Procedure: TRANSESOPHAGEAL ECHOCARDIOGRAM (TEE);  Surgeon: Berniece Salines, DO;  Location: [facility];  Service: Cardiovascular;  Laterality: N/A;   VASECTOMY      Current Outpatient Medications  Medication Sig Dispense Refill   amLODipine (NORVASC) 5 MG tablet TAKE 1 TABLET BY MOUTH EVERY DAY 90 tablet 3   atorvastatin (LIPITOR) 20 MG tablet Take 20 mg by mouth every evening.      Bioflavonoid Products (ESTER C PO) Take 1,000 mg by mouth daily.     Calcium Citrate-Vitamin D (CITRACAL + D PO) Take 1 tablet by mouth daily.     capecitabine (  XELODA) 500 MG tablet Take 4 tablets (2,000 mg total) by mouth 2 (two) times daily after a meal. Take for 14 days, then hold for 7 days. Repeat every 21 days. 112 tablet 3   cholecalciferol (VITAMIN D3) 25 MCG (1000 UNIT) tablet Take 1,000 Units by mouth daily.     iron polysaccharides (NIFEREX) 150 MG capsule Take 150 mg by mouth 2 (two) times daily.     losartan (COZAAR) 100 MG tablet Take 100 mg by mouth every evening.      omeprazole (PRILOSEC) 40 MG capsule TAKE 1 CAPSULE BY MOUTH TWICE A DAY 180 capsule 1   OVER THE COUNTER MEDICATION Take 1 tablet by mouth 2 (two) times daily. Glucocil otc supplement     OXALIPLATIN IV Inject into the vein every 21 ( twenty-one) days.     prochlorperazine (COMPAZINE) 10 MG tablet Take 1 tablet (10 mg total) by mouth every 6  (six) hours as needed (Nausea or vomiting). 30 tablet 1   Semaglutide (OZEMPIC, 0.25 OR 0.5 MG/DOSE, [place]) Inject 0.5 mg into the skin every Monday.      Vitamin D, Ergocalciferol, (DRISDOL) 50000 UNITS CAPS capsule Take 50,000 Units by mouth every Wednesday.     warfarin (COUMADIN) 5 MG tablet TAKE ONE TABLET BY MOUTH DAILY OR AS DIRECTED BY COUDAMIN CLINIC 30 tablet 5   zinc gluconate 50 MG tablet Take 50 mg by mouth daily.     metoprolol tartrate (LOPRESSOR) 100 MG tablet Take 1 tablet (100 mg total) by mouth 2 (two) times daily. 180 tablet 3   No current facility-administered medications for this encounter.    Allergies  Allergen Reactions   Ace Inhibitors Cough   Lisinopril Cough   Xarelto [Rivaroxaban] Other (See Comments)    Heart out of rhythm    Quinine Derivatives Rash    Social History   Socioeconomic History   Marital status: Married    Spouse name: Not on file   Number of children: Not on file   Years of education: Not on file   Highest education level: Not on file  Occupational History   Not on file  Tobacco Use   Smoking status: Never   Smokeless tobacco: Never  Vaping Use   Vaping Use: Never used  Substance and Sexual Activity   Alcohol use: No    Alcohol/week: 0.0 standard drinks   Drug use: No   Sexual activity: Yes    Birth control/protection: Surgical  Other Topics Concern   Not on file  Social History Narrative   Not on file   Social Determinants of Health   Financial Resource Strain: Low Risk    Difficulty of Paying Living Expenses: Not hard at all  Food Insecurity: No Food Insecurity   Worried About Charity fundraiser in the Last Year: Never true   Ran Out of Food in the Last Year: Never true  Transportation Needs: No Transportation Needs   Lack of Transportation (Medical): No   Lack of Transportation (Non-Medical): No  Physical Activity: Inactive   Days of Exercise per Week: 0 days   Minutes of Exercise per Session: 0 min  Stress: No  Stress Concern Present   Feeling of Stress : Not at all  Social Connections: Moderately Integrated   Frequency of Communication with Friends and Family: More than three times a week   Frequency of Social Gatherings with Friends and Family: More than three times a week   Attends Religious Services: More than 4  times per year   Active Member of Clubs or Organizations: No   Attends Archivist Meetings: Never   Marital Status: Married  Human resources officer Violence: Not At Risk   Fear of Current or Ex-Partner: No   Emotionally Abused: No   Physically Abused: No   Sexually Abused: No     ROS- All systems are reviewed and negative except as per the HPI above.  Physical Exam: Vitals:   01/10/21 0911  BP: (!) 152/74  Pulse: 61  SpO2: 97%  Weight: 133 kg  Height: 6\' 8"  (2.032 m)    GEN- The patient is a well appearing obese male, alert and oriented x 3 today.   Head- normocephalic, atraumatic Eyes-  Sclera clear, conjunctiva pink Ears- hearing intact Oropharynx- clear Neck- supple  Lungs- Clear to ausculation bilaterally, normal work of breathing Heart- irregular rate and rhythm, no murmurs, rubs or gallops  GI- soft, NT, ND, + BS Extremities- no clubbing, cyanosis, or edema MS- no significant deformity or atrophy Skin- no rash or lesion Psych- euthymic mood, full affect Neuro- strength and sensation are intact  Wt Readings from Last 3 Encounters:  01/10/21 133 kg  01/03/21 133.2 kg  12/29/20 133.2 kg    EKG today demonstrates  Afib Vent. rate 104 BPM PR interval * ms QRS duration 100 ms QT/QTcB 352/462 ms  TEE 09/11/18  1. The left ventricle has normal systolic function, with an ejection  fraction of 55-60%. The cavity size was normal.   2. Left atrial size was Left atrium is enlarged.   3. No evidence of intracardiac thrombus. Normal LA appendage without  thrombus, normal emptying velocity 70cm/s.   4. Mitral valve regurgitation is mild to moderate by  color flow Doppler.   5. The aortic valve is tricuspid No stenosis of the aortic valve.   6. The aortic root is normal in size and structure.   Epic records are reviewed at length today  CHA2DS2-VASc Score = 2  The patient's score is based upon: CHF History: 0 HTN History: 1 Diabetes History: 1 Stroke History: 0 Vascular Disease History: 0 Age Score: 0 Gender Score: 0       ASSESSMENT AND PLAN: 1. Persistent Atrial Fibrillation (ICD10:  I48.19) The patient's CHA2DS2-VASc score is 2, indicating a 2.2% annual risk of stroke.   Patient has a long history of afib. Last DCCV was 2020. Successful TEE guided cardioversion 01/03/21 and remains in SR today  We discussed rhythm control options today if ERAF, for now he wants to hold the course   Check bmet/cbc Continue warfarin Continue Lopressor 100 mg BID  2. Secondary Hypercoagulable State (ICD10:  D68.69) The patient is at significant risk for stroke/thromboembolism based upon his CHA2DS2-VASc Score of 2.  Continue Warfarin (Coumadin).   3. Obesity Body mass index is 32.21 kg/m. Lifestyle modification was discussed at length including regular exercise and weight reduction.  4. HTN Mildly elevated here At home controlled  Stable, no changes today.  5. Colon cancer Plans per oncology    Follow up in the AF clinic as needed     Butch Penny C. Prestyn Mahn, [facility] [address] [place], [place] [address] [phone]

## 2021-01-11 ENCOUNTER — Ambulatory Visit (INDEPENDENT_AMBULATORY_CARE_PROVIDER_SITE_OTHER): Payer: Medicare HMO | Admitting: *Deleted

## 2021-01-11 ENCOUNTER — Other Ambulatory Visit: Payer: Self-pay | Admitting: Cardiology

## 2021-01-11 DIAGNOSIS — Z5181 Encounter for therapeutic drug level monitoring: Secondary | ICD-10-CM

## 2021-01-11 DIAGNOSIS — I480 Paroxysmal atrial fibrillation: Secondary | ICD-10-CM

## 2021-01-11 LAB — POCT INR: INR: 2 (ref 2.0–3.0)

## 2021-01-11 MED ORDER — WARFARIN SODIUM 5 MG PO TABS: ORAL_TABLET | ORAL | 5 refills | Status: DC

## 2021-01-11 NOTE — Patient Instructions (Signed)
1st Post TEE/DCCV on 01/03/21 Increase warfarin to 1 1/2 tablets daily except 1 tablet on Mondays, Wednesdays and Fridays Recheck INR in 1 week. Coumadin Clinic [phone] Pt was on Xeloda (chemo)

## 2021-01-12 ENCOUNTER — Other Ambulatory Visit (HOSPITAL_COMMUNITY): Payer: Self-pay

## 2021-01-16 ENCOUNTER — Encounter (HOSPITAL_COMMUNITY): Payer: Self-pay | Admitting: Hematology

## 2021-01-19 ENCOUNTER — Other Ambulatory Visit: Payer: Self-pay

## 2021-01-19 ENCOUNTER — Ambulatory Visit (INDEPENDENT_AMBULATORY_CARE_PROVIDER_SITE_OTHER): Payer: Medicare HMO | Admitting: *Deleted

## 2021-01-19 DIAGNOSIS — Z5181 Encounter for therapeutic drug level monitoring: Secondary | ICD-10-CM | POA: Diagnosis not present

## 2021-01-19 DIAGNOSIS — I4819 Other persistent atrial fibrillation: Secondary | ICD-10-CM | POA: Diagnosis not present

## 2021-01-19 DIAGNOSIS — I480 Paroxysmal atrial fibrillation: Secondary | ICD-10-CM | POA: Diagnosis not present

## 2021-01-19 LAB — POCT INR: INR: 1.3 — AB (ref 2.0–3.0)

## 2021-01-19 NOTE — Patient Instructions (Signed)
2nd post TEE/DCCV on 01/03/21 Take warfarin 2 tablets tonight and tomorrow night then increase dose to 1 1/2 tablets daily  Recheck INR in 1 week. Coumadin Clinic [phone] Pt was on Xeloda (chemo) Pt finished chemo infusions. Last infusion is on 12/8

## 2021-01-20 ENCOUNTER — Other Ambulatory Visit (HOSPITAL_COMMUNITY): Payer: Self-pay

## 2021-01-20 DIAGNOSIS — E1165 Type 2 diabetes mellitus with hyperglycemia: Secondary | ICD-10-CM | POA: Diagnosis not present

## 2021-01-20 DIAGNOSIS — Z7984 Long term (current) use of oral hypoglycemic drugs: Secondary | ICD-10-CM | POA: Diagnosis not present

## 2021-01-20 DIAGNOSIS — E7849 Other hyperlipidemia: Secondary | ICD-10-CM | POA: Diagnosis not present

## 2021-01-20 DIAGNOSIS — I10 Essential (primary) hypertension: Secondary | ICD-10-CM | POA: Diagnosis not present

## 2021-01-20 DIAGNOSIS — I480 Paroxysmal atrial fibrillation: Secondary | ICD-10-CM | POA: Diagnosis not present

## 2021-01-24 ENCOUNTER — Other Ambulatory Visit (HOSPITAL_COMMUNITY): Payer: Self-pay

## 2021-01-24 ENCOUNTER — Ambulatory Visit (INDEPENDENT_AMBULATORY_CARE_PROVIDER_SITE_OTHER): Payer: Medicare HMO | Admitting: *Deleted

## 2021-01-24 DIAGNOSIS — I480 Paroxysmal atrial fibrillation: Secondary | ICD-10-CM

## 2021-01-24 DIAGNOSIS — Z5181 Encounter for therapeutic drug level monitoring: Secondary | ICD-10-CM

## 2021-01-24 LAB — POCT INR: INR: 1.5 — AB (ref 2.0–3.0)

## 2021-01-24 NOTE — Patient Instructions (Signed)
3rd post TEE/DCCV on 01/03/21 Take warfarin 2 tablets tonight then increase dose to 2 tablets daily except 1 1/2 tablets on Sundays, Tuesdays and Thursdays  Recheck INR in 1 week. Coumadin Clinic [phone] Pt was on Xeloda (chemo)

## 2021-01-25 ENCOUNTER — Other Ambulatory Visit: Payer: Self-pay

## 2021-01-25 ENCOUNTER — Inpatient Hospital Stay (HOSPITAL_COMMUNITY): Payer: Medicare HMO | Attending: Hematology

## 2021-01-25 DIAGNOSIS — C184 Malignant neoplasm of transverse colon: Secondary | ICD-10-CM | POA: Insufficient documentation

## 2021-01-25 DIAGNOSIS — C182 Malignant neoplasm of ascending colon: Secondary | ICD-10-CM | POA: Insufficient documentation

## 2021-01-25 DIAGNOSIS — C642 Malignant neoplasm of left kidney, except renal pelvis: Secondary | ICD-10-CM | POA: Insufficient documentation

## 2021-01-25 DIAGNOSIS — Z800 Family history of malignant neoplasm of digestive organs: Secondary | ICD-10-CM | POA: Diagnosis not present

## 2021-01-25 DIAGNOSIS — Z808 Family history of malignant neoplasm of other organs or systems: Secondary | ICD-10-CM | POA: Insufficient documentation

## 2021-01-25 DIAGNOSIS — I10 Essential (primary) hypertension: Secondary | ICD-10-CM | POA: Diagnosis not present

## 2021-01-25 DIAGNOSIS — Z801 Family history of malignant neoplasm of trachea, bronchus and lung: Secondary | ICD-10-CM | POA: Insufficient documentation

## 2021-01-25 DIAGNOSIS — E1142 Type 2 diabetes mellitus with diabetic polyneuropathy: Secondary | ICD-10-CM | POA: Diagnosis not present

## 2021-01-25 DIAGNOSIS — D649 Anemia, unspecified: Secondary | ICD-10-CM

## 2021-01-25 LAB — COMPREHENSIVE METABOLIC PANEL
ALT: 27 U/L (ref 0–44)
AST: 28 U/L (ref 15–41)
Albumin: 4.3 g/dL (ref 3.5–5.0)
Alkaline Phosphatase: 111 U/L (ref 38–126)
Anion gap: 10 (ref 5–15)
BUN: 22 mg/dL (ref 8–23)
CO2: 27 mmol/L (ref 22–32)
Calcium: 9.3 mg/dL (ref 8.9–10.3)
Chloride: 103 mmol/L (ref 98–111)
Creatinine, Ser: 1.27 mg/dL — ABNORMAL HIGH (ref 0.61–1.24)
GFR, Estimated: >60 mL/min (ref >60)
Glucose, Bld: 119 mg/dL — ABNORMAL HIGH (ref 70–99)
Potassium: 4.4 mmol/L (ref 3.5–5.1)
Sodium: 140 mmol/L (ref 135–145)
Total Bilirubin: 2.2 mg/dL — ABNORMAL HIGH (ref 0.3–1.2)
Total Protein: 7.8 g/dL (ref 6.5–8.1)

## 2021-01-25 LAB — CBC WITH DIFFERENTIAL/PLATELET
Abs Immature Granulocytes: 0.02 10*3/uL (ref 0.00–0.07)
Basophils Absolute: 0 10*3/uL (ref 0.0–0.1)
Basophils Relative: 1 %
Eosinophils Absolute: 0.1 10*3/uL (ref 0.0–0.5)
Eosinophils Relative: 3 %
HCT: 41.7 % (ref 39.0–52.0)
Hemoglobin: 13.4 g/dL (ref 13.0–17.0)
Immature Granulocytes: 1 %
Lymphocytes Relative: 19 %
Lymphs Abs: 0.8 10*3/uL (ref 0.7–4.0)
MCH: 28.6 pg (ref 26.0–34.0)
MCHC: 32.1 g/dL (ref 30.0–36.0)
MCV: 89.1 fL (ref 80.0–100.0)
Monocytes Absolute: 0.6 10*3/uL (ref 0.1–1.0)
Monocytes Relative: 12 %
Neutro Abs: 2.9 10*3/uL (ref 1.7–7.7)
Neutrophils Relative %: 64 %
Platelets: 181 10*3/uL (ref 150–400)
RBC: 4.68 MIL/uL (ref 4.22–5.81)
RDW: 22.9 % — ABNORMAL HIGH (ref 11.5–15.5)
WBC: 4.4 10*3/uL (ref 4.0–10.5)
nRBC: 0 % (ref 0.0–0.2)

## 2021-01-25 LAB — IRON AND TIBC
Iron: 115 ug/dL (ref 45–182)
Saturation Ratios: 25 % (ref 17.9–39.5)
TIBC: 464 ug/dL — ABNORMAL HIGH (ref 250–450)
UIBC: 349 ug/dL

## 2021-01-25 LAB — FERRITIN: Ferritin: 32 ng/mL (ref 24–336)

## 2021-01-25 LAB — MAGNESIUM: Magnesium: 2 mg/dL (ref 1.7–2.4)

## 2021-01-26 LAB — CEA: CEA: 2.1 ng/mL (ref 0.0–4.7)

## 2021-01-27 DIAGNOSIS — E8881 Metabolic syndrome: Secondary | ICD-10-CM | POA: Diagnosis not present

## 2021-01-27 DIAGNOSIS — I10 Essential (primary) hypertension: Secondary | ICD-10-CM | POA: Diagnosis not present

## 2021-01-27 DIAGNOSIS — D529 Folate deficiency anemia, unspecified: Secondary | ICD-10-CM | POA: Diagnosis not present

## 2021-01-27 DIAGNOSIS — Z1329 Encounter for screening for other suspected endocrine disorder: Secondary | ICD-10-CM | POA: Diagnosis not present

## 2021-01-27 DIAGNOSIS — C189 Malignant neoplasm of colon, unspecified: Secondary | ICD-10-CM | POA: Diagnosis not present

## 2021-01-27 DIAGNOSIS — Z1159 Encounter for screening for other viral diseases: Secondary | ICD-10-CM | POA: Diagnosis not present

## 2021-01-27 DIAGNOSIS — D649 Anemia, unspecified: Secondary | ICD-10-CM | POA: Diagnosis not present

## 2021-01-27 DIAGNOSIS — E782 Mixed hyperlipidemia: Secondary | ICD-10-CM | POA: Diagnosis not present

## 2021-01-27 DIAGNOSIS — D519 Vitamin B12 deficiency anemia, unspecified: Secondary | ICD-10-CM | POA: Diagnosis not present

## 2021-01-27 DIAGNOSIS — E1165 Type 2 diabetes mellitus with hyperglycemia: Secondary | ICD-10-CM | POA: Diagnosis not present

## 2021-01-27 DIAGNOSIS — Z125 Encounter for screening for malignant neoplasm of prostate: Secondary | ICD-10-CM | POA: Diagnosis not present

## 2021-01-30 ENCOUNTER — Encounter (HOSPITAL_COMMUNITY): Payer: Self-pay | Admitting: Hematology

## 2021-01-30 ENCOUNTER — Inpatient Hospital Stay (HOSPITAL_COMMUNITY): Payer: Medicare HMO | Admitting: Hematology

## 2021-01-30 ENCOUNTER — Other Ambulatory Visit: Payer: Self-pay

## 2021-01-30 VITALS — BP 127/74 | HR 63 | Resp 18 | Ht >= 80 in | Wt 293.7 lb

## 2021-01-30 DIAGNOSIS — C182 Malignant neoplasm of ascending colon: Secondary | ICD-10-CM

## 2021-01-30 DIAGNOSIS — Z801 Family history of malignant neoplasm of trachea, bronchus and lung: Secondary | ICD-10-CM | POA: Diagnosis not present

## 2021-01-30 DIAGNOSIS — E1142 Type 2 diabetes mellitus with diabetic polyneuropathy: Secondary | ICD-10-CM | POA: Diagnosis not present

## 2021-01-30 DIAGNOSIS — Z800 Family history of malignant neoplasm of digestive organs: Secondary | ICD-10-CM | POA: Diagnosis not present

## 2021-01-30 DIAGNOSIS — I10 Essential (primary) hypertension: Secondary | ICD-10-CM | POA: Diagnosis not present

## 2021-01-30 DIAGNOSIS — Z808 Family history of malignant neoplasm of other organs or systems: Secondary | ICD-10-CM | POA: Diagnosis not present

## 2021-01-30 DIAGNOSIS — C642 Malignant neoplasm of left kidney, except renal pelvis: Secondary | ICD-10-CM | POA: Diagnosis not present

## 2021-01-30 DIAGNOSIS — C184 Malignant neoplasm of transverse colon: Secondary | ICD-10-CM | POA: Diagnosis not present

## 2021-01-30 NOTE — Progress Notes (Signed)
[address], [place] [address]   CLINIC:  Medical Oncology/Hematology  PCP:  Caryl Bis, MD [place] [id number] [phone]   REASON FOR VISIT:  Follow-up for colon cancer  PRIOR THERAPY: Left nephrectomy by Dr. 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 A variant of uncertain significance was identified in the MET and RAD51D genes. Report date is 10/27/2020.   The Common Hereditary Cancers + RNA Panel offered by [facility] includes sequencing, deletion/duplication, and RNA testing of the following 47 genes: APC, ATM, AXIN2, BARD1, BMPR1A, BRCA1, BRCA2, BRIP1, CDH1, CDK4*, CDKN2A (p14ARF)*, CDKN2A (p16INK4a)*, CHEK2, CTNNA1, DICER1, EPCAM (Deletion/duplication testing only), GREM1 (promoter region deletion/duplication testing only), KIT, MEN1, MLH1, MSH2, MSH3, MSH6, MUTYH, NBN, NF1, NHTL1, PALB2, PDGFRA*, PMS2, POLD1, POLE, PTEN, RAD50, RAD51C, RAD51D, SDHB, SDHC, SDHD, SMAD4, SMARCA4. STK11, TP53, TSC1, TSC2, and VHL.  The following genes were evaluated for sequence changes only: SDHA and HOXB13 c.251G>A variant only.  RNA analysis is not performed for the * genes.       CANCER STAGING:  Cancer Staging  Cancer of ascending colon [facility]) Staging form: Colon and Rectum, AJCC 8th Edition - Clinical stage from 09/01/2020: Stage IIIB (cT3, cN1b, cM0) - Unsigned    INTERVAL HISTORY:  [name] 61 y.o. male returns for follow-up of colon cancer.   Today he reports feeling good. He reports slightly  increased neuropathy in his fingertips and toes. He denies n/v/d/c and cold sensitivity.   REVIEW OF SYSTEMS:  Review of Systems  Constitutional:  Negative for appetite change and fatigue.  Gastrointestinal:  Negative for constipation, diarrhea, nausea and vomiting.  All other systems reviewed and are negative.   PAST MEDICAL/SURGICAL HISTORY:  Past Medical History:  Diagnosis Date   A-fib [facility])    DCCV 2009, 2012, 2014, Nov 2016   Anxiety    Arthritis    Knees, wrist, ankles   Cancer ([place]) 07/2019   Kidney   Colon cancer ([place])    Diabetes mellitus without complication ([place])    Dysrhythmia 1996   A- Fib   GERD (gastroesophageal reflux disease)    History of kidney stones    HX: anticoagulation    very remotely and briefly on COumadin aound one of his CV, 2014 on Eliquis had hematuria with renal calculi and stopped   Hyperlipidemia    patient denies this   Hypertension    IBS (irritable bowel syndrome)    Normal cardiac stress test    Normal nuclear stress test , 2001   Past Surgical History:  Procedure Laterality Date   BIOPSY  08/19/2020   Procedure: BIOPSY;  Surgeon: Harvel Quale, MD;  [facility];  Service: Gastroenterology;;   CARDIOVERSION N/A 10/15/2012   Procedure: CARDIOVERSION;  Surgeon: Larey Dresser, MD;  Location: [facility] ENDOSCOPY;  Service: Cardiovascular;  Laterality: N/A;   CARDIOVERSION N/A 11/26/2014   Procedure: CARDIOVERSION;  Surgeon: Arnoldo Lenis, MD;  Location: [facility] ORS;  Service: Endoscopy;  Laterality: N/A;   CARDIOVERSION N/A 11/15/2015   Procedure: CARDIOVERSION;  Surgeon: Jerline Pain, MD;  Location: [place];  Service: Cardiovascular;  Laterality: N/A;   CARDIOVERSION N/A 08/07/2016   Procedure: CARDIOVERSION;  Surgeon: Herminio Commons, MD;  Location: [facility] OR[facility];  Service: Cardiovascular;  Laterality: N/A;   CARDIOVERSION N/A 09/11/2018   Procedure: CARDIOVERSION;  Surgeon: Arnoldo Lenis, MD;  [facility];   Service: Endoscopy;  Laterality: N/A;   CARDIOVERSION N/A 01/03/2021   Procedure: CARDIOVERSION;  Surgeon: Berniece Salines, DO;  Location: [place];  Service: Cardiovascular;  Laterality: N/A;   CATARACT EXTRACTION W/PHACO Right 01/18/2014   Procedure: CATARACT EXTRACTION PHACO AND INTRAOCULAR LENS PLACEMENT (IOC);  Surgeon: Tonny Branch, MD;  Location: [facility];  Service: Ophthalmology;  Laterality: Right;  CDE 9.95   COLONOSCOPY WITH PROPOFOL N/A 08/19/2020   Procedure: COLONOSCOPY WITH PROPOFOL;  Surgeon: Harvel Quale, MD;  [facility];  Service: Gastroenterology;  Laterality: N/A;  8:20   CYSTOSCOPY WITH LITHOLAPAXY N/A 10/06/2019   Procedure: CYSTOSCOPY WITH LASER LITHOLAPAXY;  Surgeon: Ceasar Mons, MD;  [facility];  Service: Urology;  Laterality: N/A;   ESOPHAGOGASTRODUODENOSCOPY (EGD) WITH PROPOFOL N/A 08/19/2020   Procedure: ESOPHAGOGASTRODUODENOSCOPY (EGD) WITH PROPOFOL;  Surgeon: Harvel Quale, MD;  [facility];  Service: Gastroenterology;  Laterality: N/A;   EYE SURGERY Right    detached retina   FOOT SURGERY Left    KNEE ARTHROSCOPY Right    LAPAROSCOPIC RIGHT HEMI COLECTOMY N/A 09/30/2020   Procedure: LAPAROSCOPIC RIGHT HEMI COLECTOMY;  Surgeon: Ileana Roup, MD;  [facility];  Service: General;  Laterality: N/A;   LITHOTRIPSY     for kidney stones 2002   POLYPECTOMY  08/19/2020   Procedure: POLYPECTOMY INTESTINAL;  Surgeon: Harvel Quale, MD;  [facility];  Service: Gastroenterology;;   POLYPECTOMY  08/19/2020   Procedure: POLYPECTOMY;  Surgeon: Harvel Quale, MD;  [facility];  Service: Gastroenterology;;   ROBOT ASSISTED LAPAROSCOPIC NEPHRECTOMY Left 10/06/2019   Procedure: XI ROBOTIC ASSISTED LAPAROSCOPIC NEPHRECTOMY WITH ADRENALECTOMY;  Surgeon: Ceasar Mons, MD;  [facility];  Service: Urology;  Laterality: Left;   SUBMUCOSAL TATTOO  INJECTION  08/19/2020   Procedure: SUBMUCOSAL TATTOO INJECTION;  Surgeon: Harvel Quale, MD;  [facility];  Service: Gastroenterology;;   TEE WITHOUT CARDIOVERSION N/A 11/26/2014   Procedure: TRANSESOPHAGEAL ECHOCARDIOGRAM (TEE) WITH PROPOFOL;  Surgeon: Arnoldo Lenis, MD;  [facility];  Service: Endoscopy;  Laterality: N/A;   TEE WITHOUT CARDIOVERSION N/A 11/15/2015   Procedure: TRANSESOPHAGEAL ECHOCARDIOGRAM (TEE);  Surgeon: Jerline Pain, MD;  Location: [place];  Service: Cardiovascular;  Laterality: N/A;   TEE WITHOUT CARDIOVERSION N/A 09/11/2018   Procedure: TRANSESOPHAGEAL ECHOCARDIOGRAM (TEE) WITH PROPOFOL;  Surgeon: Arnoldo Lenis, MD;  [facility];  Service: Endoscopy;  Laterality: N/A;   TEE WITHOUT CARDIOVERSION N/A 01/03/2021   Procedure: TRANSESOPHAGEAL ECHOCARDIOGRAM (TEE);  Surgeon: Berniece Salines, DO;  [facility];  Service: Cardiovascular;  Laterality: N/A;   VASECTOMY       SOCIAL HISTORY:  Social History   Socioeconomic History   Marital status: Married    Spouse name: Not on file   Number of children: Not on file   Years of education: Not on file   Highest education level: Not on file  Occupational History   Not on file  Tobacco Use   Smoking status: Never   Smokeless tobacco: Never  Vaping Use   Vaping Use: Never used  Substance and Sexual Activity   Alcohol use: No    Alcohol/week: 0.0 standard drinks   Drug use: No   Sexual activity: Yes    Birth control/protection: Surgical  Other Topics Concern   Not on file  Social History Narrative   Not on file   Social Determinants of Health   Financial Resource Strain: Low Risk    Difficulty of Paying Living Expenses: Not hard at all  Food Insecurity: No Food Insecurity   Worried About Charity fundraiser in the Last Year: Never true   Anton Ruiz in the Last Year: Never true  Transportation Needs: No Transportation Needs   Lack of Transportation  (Medical): No   Lack of Transportation (Non-Medical): No  Physical Activity: Inactive   Days of Exercise per Week: 0 days   Minutes of Exercise per Session: 0 min  Stress: No Stress Concern Present   Feeling of Stress : Not at all  Social Connections: Moderately Integrated   Frequency of Communication with Friends and Family: More than three times a week   Frequency of Social Gatherings with Friends and Family: More than three times a week   Attends Religious Services: More than 4 times per year   Active Member of [employer] or Organizations: No   Attends Archivist Meetings: Never   Marital Status: Married  Human resources officer Violence: Not At Risk   Fear of Current or Ex-Partner: No   Emotionally Abused: No   Physically Abused: No   Sexually Abused: No    FAMILY HISTORY:  Family History  Problem Relation Age of Onset   Diabetes Mother    Hypertension Mother    Colon cancer Mother        dx. 71s   Diabetes Father    Colon cancer Father        dx. 6s   Lung cancer Maternal Grandmother        smoked   Bone cancer Maternal Grandfather        dx. 80s   Hypertension Other     CURRENT MEDICATIONS:  Outpatient Encounter Medications as of 01/30/2021  Medication Sig   amLODipine (NORVASC) 5 MG tablet TAKE ONE TABLET BY MOUTH EVERY DAY   atorvastatin (LIPITOR) 20 MG tablet Take 20 mg by mouth every evening.    Bioflavonoid Products (ESTER C PO) Take 1,000 mg by mouth daily.   Calcium Citrate-Vitamin D (CITRACAL + D PO) Take 1 tablet by mouth daily.   capecitabine (XELODA) 500 MG tablet Take 4 tablets (2,000 mg total) by mouth 2 (two) times daily after a meal. Take for 14 days, then hold for 7 days. Repeat every 21 days.   cholecalciferol (VITAMIN D3) 25 MCG (1000 UNIT) tablet Take 1,000 Units by mouth daily.   iron polysaccharides (NIFEREX) 150 MG capsule Take 150 mg by mouth 2 (two) times daily.   losartan (COZAAR) 100 MG tablet Take 100 mg by mouth every evening.     metoprolol tartrate (LOPRESSOR) 100 MG tablet Take 1 tablet (100 mg total) by mouth 2 (two) times daily.   omeprazole (PRILOSEC) 40 MG capsule TAKE 1 CAPSULE BY MOUTH TWICE A DAY   OVER THE COUNTER MEDICATION Take 1 tablet by mouth 2 (two) times daily. Glucocil otc supplement   OXALIPLATIN IV Inject into the vein every 21 ( twenty-one) days.   prochlorperazine (COMPAZINE) 10 MG tablet Take 1 tablet (10 mg total) by  mouth every 6 (six) hours as needed (Nausea or vomiting).   Semaglutide (OZEMPIC, 0.25 OR 0.5 MG/DOSE, [place]) Inject 0.5 mg into the skin every Monday.    Vitamin D, Ergocalciferol, (DRISDOL) 50000 UNITS CAPS capsule Take 50,000 Units by mouth every Wednesday.   warfarin (COUMADIN) 5 MG tablet TAKE ONE TABLET BY MOUTH DAILY OR AS DIRECTED BY COUDAMIN CLINIC   zinc gluconate 50 MG tablet Take 50 mg by mouth daily.   No facility-administered encounter medications on file as of 01/30/2021.    ALLERGIES:  Allergies  Allergen Reactions   Ace Inhibitors Cough   Lisinopril Cough   Xarelto [Rivaroxaban] Other (See Comments)    Heart out of rhythm    Quinine Derivatives Rash     PHYSICAL EXAM:  ECOG Performance status: 1  Vitals:   01/30/21 1514  BP: 127/74  Pulse: 63  Resp: 18  SpO2: 98%   Filed Weights   01/30/21 1514  Weight: 293 lb 11.2 oz (133.2 kg)   Physical Exam Vitals reviewed.  Constitutional:      Appearance: Normal appearance.  Cardiovascular:     Rate and Rhythm: Normal rate and regular rhythm.     Pulses: Normal pulses.     Heart sounds: Normal heart sounds.  Pulmonary:     Effort: Pulmonary effort is normal.     Breath sounds: Normal breath sounds.  Neurological:     Mental Status: He is alert and oriented to person, place, and time.     LABORATORY DATA:  I have reviewed the labs as listed.  CBC    Component Value Date/Time   WBC 4.4 01/25/2021 1405   RBC 4.68 01/25/2021 1405   HGB 13.4 01/25/2021 1405   HCT 41.7 01/25/2021 1405   PLT 181  01/25/2021 1405   MCV 89.1 01/25/2021 1405   MCH 28.6 01/25/2021 1405   MCHC 32.1 01/25/2021 1405   RDW 22.9 (H) 01/25/2021 1405   LYMPHSABS 0.8 01/25/2021 1405   MONOABS 0.6 01/25/2021 1405   EOSABS 0.1 01/25/2021 1405   BASOSABS 0.0 01/25/2021 1405   CMP Latest Ref Rng & Units 01/25/2021 12/29/2020 12/22/2020  Glucose 70 - 99 mg/dL 119(H) 100(H) 98  BUN 8 - 23 mg/dL 22 18 18   Creatinine 0.61 - 1.24 mg/dL 1.27(H) 1.24 1.14  Sodium 135 - 145 mmol/L 140 140 140  Potassium 3.5 - 5.1 mmol/L 4.4 4.0 3.8  Chloride 98 - 111 mmol/L 103 106 107  CO2 22 - 32 mmol/L 27 27 26   Calcium 8.9 - 10.3 mg/dL 9.3 8.8(L) 8.9  Total Protein 6.5 - 8.1 g/dL 7.8 7.0 6.8  Total Bilirubin 0.3 - 1.2 mg/dL 2.2(H) 1.4(H) 1.4(H)  Alkaline Phos 38 - 126 U/L 111 112 99  AST 15 - 41 U/L 28 21 24   ALT 0 - 44 U/L 27 19 22     DIAGNOSTIC IMAGING:  I have independently reviewed the scans and discussed with the patient.  ASSESSMENT:  1.  Right-sided colon cancer: - He had 3 prior colonoscopies, last one 2 years ago by Dr. Wonda Cheng in [place], with multiple polyps removed. - Colonoscopy by Dr. Jenetta Downer on 08/19/2020 showed fungating infiltrative ulcerated partially obstructing large mass in the proximal ascending colon.  Mass was partially circumferential.  7 sessile polyps found in the transverse colon, ascending colon and cecum. - Pathology consistent with adenocarcinoma of the biopsy of the mass.  Single fragment of at least intramucosal adenocarcinoma in the transverse colon polypectomy.  Pathology report has  descending colon as adenocarcinoma. - CT CAP with contrast on 08/26/2020 shows circumferential lesion at the hepatic flexure of the colon consistent with primary colorectal carcinoma with no pathologically enlarged lymph node metastasis.  No liver metastasis.  Status post left nephrectomy.  No evidence of thoracic metastasis. - Even though descending colon polypectomy was reported as adenocarcinoma, Dr. Jenetta Downer felt  that it was mislabeled.  Mass was reported in the proximal ascending colon on colonoscopy and close to hepatic flexure on CT scan. - Right hemicolectomy on 09/30/2020 - Pathology PT3PN1B, 2/30 lymph nodes positive, 1 tumor deposit, margins negative, MSI stable no lymphovascular or perineural invasion, grade 2, no perforation. -4 cycles of CAPEOX from 10/27/2020 through 12/29/2020.  2.  Left kidney renal cell carcinoma: - Incidentally found on work-up for kidney stones. - Left nephrectomy by Dr. Gilford Rile on 10/06/2019 with pathology showing clear-cell renal cell carcinoma, grade 2, 6 cm, margins negative.  1 benign lymph node.  PT1BPN0.  3.  Social/family history: - He is a disabled.  He worked at [employer] and a [employer].  He might have exposed to some chemicals at the later job.  No prior history of smoking. - Family history significant for mother with colon cancer, father with colon cancer and maternal grandfather with bone cancer.     PLAN:  1.  Stage III (PT3BPN1B right-sided colon cancer: -He has tolerated first cycle very well.  No GI side effects were noted. - Cold sensitivity has improved. - Reviewed labs today which showed normal LFTs.  CBC was grossly normal.  CEA was 2.1. - Discussed surveillance plan with blood counts and CEA every 3 months and CT AP every 6 months for the first 2 years. - His last CT scan was in August 2022.  Recommend CT AP with contrast in 2 months with repeat CEA level.  2.  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  3.  Peripheral neuropathy: -He reports slight worsening of numbness in the fingertips which is not interfering with any day-to-day activities.  Feet numbness is stable.  4.  Iron deficiency state: -Continue iron tablet daily.  Ferritin today is 32 and percent saturation is 25.    Orders placed this encounter:  No orders of the defined types were placed in this  encounter.     Derek Jack, MD [place] [phone]   I, Thana Ates, am acting as a scribe for Dr. Derek Jack.   I, Derek Jack MD, have reviewed the above documentation for accuracy and completeness, and I agree with the above.

## 2021-01-30 NOTE — Patient Instructions (Signed)
[place] at [facility] Discharge Instructions  You were seen and examined today by Dr. Delton Coombes. He reviewed your most recent labs and your bilirubin is slightly elevated but this can come from the Xeloda, iron is on the lower side but eating protein will help it go up. Please keep your follow up appointment as scheduled in 2 months.   Thank you for choosing [place] at [facility] to provide your oncology and hematology care.  To afford each patient quality time with our provider, please arrive at least 15 minutes before your scheduled appointment time.   If you have a lab appointment with the [facility] please come in thru the Main Entrance and check in at the main information desk.  You need to re-schedule your appointment should you arrive 10 or more minutes late.  We strive to give you quality time with our providers, and arriving late affects you and other patients whose appointments are after yours.  Also, if you no show three or more times for appointments you may be dismissed from the clinic at the providers discretion.     Again, thank you for choosing [facility].  Our hope is that these requests will decrease the amount of time that you wait before being seen by our physicians.       _____________________________________________________________  Should you have questions after your visit to [facility], please contact our office at [phone] and follow the prompts.  Our office hours are 8:00 a.m. and 4:30 p.m. Monday - Friday.  Please note that voicemails left after 4:00 p.m. may not be returned until the following business day.  We are closed weekends and major holidays.  You do have access to a nurse 24-7, just call the main number to the clinic [phone] and do not press any options, hold on the line and a nurse will answer the phone.    For prescription refill requests, have your pharmacy contact  our office and allow 72 hours.    Due to Covid, you will need to wear a mask upon entering the hospital. If you do not have a mask, a mask will be given to you at the Main Entrance upon arrival. For doctor visits, patients may have 1 support person age 13 or older with them. For treatment visits, patients can not have anyone with them due to social distancing guidelines and our immunocompromised population.

## 2021-02-01 DIAGNOSIS — E1142 Type 2 diabetes mellitus with diabetic polyneuropathy: Secondary | ICD-10-CM | POA: Diagnosis not present

## 2021-02-01 DIAGNOSIS — M1000 Idiopathic gout, unspecified site: Secondary | ICD-10-CM | POA: Diagnosis not present

## 2021-02-01 DIAGNOSIS — D500 Iron deficiency anemia secondary to blood loss (chronic): Secondary | ICD-10-CM | POA: Diagnosis not present

## 2021-02-01 DIAGNOSIS — Z0001 Encounter for general adult medical examination with abnormal findings: Secondary | ICD-10-CM | POA: Diagnosis not present

## 2021-02-01 DIAGNOSIS — Z23 Encounter for immunization: Secondary | ICD-10-CM | POA: Diagnosis not present

## 2021-02-01 DIAGNOSIS — C189 Malignant neoplasm of colon, unspecified: Secondary | ICD-10-CM | POA: Diagnosis not present

## 2021-02-01 DIAGNOSIS — I10 Essential (primary) hypertension: Secondary | ICD-10-CM | POA: Diagnosis not present

## 2021-02-01 DIAGNOSIS — I480 Paroxysmal atrial fibrillation: Secondary | ICD-10-CM | POA: Diagnosis not present

## 2021-02-02 ENCOUNTER — Ambulatory Visit (INDEPENDENT_AMBULATORY_CARE_PROVIDER_SITE_OTHER): Payer: Medicare HMO | Admitting: *Deleted

## 2021-02-02 DIAGNOSIS — Z5181 Encounter for therapeutic drug level monitoring: Secondary | ICD-10-CM

## 2021-02-02 DIAGNOSIS — I480 Paroxysmal atrial fibrillation: Secondary | ICD-10-CM | POA: Diagnosis not present

## 2021-02-02 LAB — POCT INR: INR: 2.3 (ref 2.0–3.0)

## 2021-02-02 MED ORDER — WARFARIN SODIUM 5 MG PO TABS: ORAL_TABLET | ORAL | 5 refills | Status: DC

## 2021-02-06 NOTE — Patient Instructions (Signed)
4th post TEE/DCCV on 01/03/21 Continue warfarin 2 tablets daily except 1 1/2 tablets on Sundays, Tuesdays and Thursdays  Recheck INR in 10 days. Coumadin Clinic [phone] Pt was on Xeloda  Pt finished chemo infusions. Last infusion is on 12/8

## 2021-02-13 ENCOUNTER — Ambulatory Visit (INDEPENDENT_AMBULATORY_CARE_PROVIDER_SITE_OTHER): Payer: Medicare HMO | Admitting: *Deleted

## 2021-02-13 ENCOUNTER — Other Ambulatory Visit: Payer: Self-pay

## 2021-02-13 DIAGNOSIS — I480 Paroxysmal atrial fibrillation: Secondary | ICD-10-CM | POA: Diagnosis not present

## 2021-02-13 DIAGNOSIS — Z5181 Encounter for therapeutic drug level monitoring: Secondary | ICD-10-CM

## 2021-02-13 LAB — POCT INR: INR: 2.7 (ref 2.0–3.0)

## 2021-02-13 NOTE — Patient Instructions (Signed)
Continue warfarin 2 tablets daily except 1 1/2 tablets on Sundays, Tuesdays and Thursdays  Recheck INR in 3 wks   Coumadin Clinic [phone] Pt was on Xeloda  Pt finished chemo infusions. Last infusion is on 12/8

## 2021-02-19 DIAGNOSIS — I10 Essential (primary) hypertension: Secondary | ICD-10-CM | POA: Diagnosis not present

## 2021-02-19 DIAGNOSIS — E1165 Type 2 diabetes mellitus with hyperglycemia: Secondary | ICD-10-CM | POA: Diagnosis not present

## 2021-02-19 DIAGNOSIS — I480 Paroxysmal atrial fibrillation: Secondary | ICD-10-CM | POA: Diagnosis not present

## 2021-02-19 DIAGNOSIS — E7849 Other hyperlipidemia: Secondary | ICD-10-CM | POA: Diagnosis not present

## 2021-03-01 ENCOUNTER — Other Ambulatory Visit: Payer: Medicare HMO

## 2021-03-02 ENCOUNTER — Other Ambulatory Visit (HOSPITAL_COMMUNITY): Payer: Medicare HMO

## 2021-03-08 ENCOUNTER — Ambulatory Visit (INDEPENDENT_AMBULATORY_CARE_PROVIDER_SITE_OTHER): Payer: Medicare HMO | Admitting: *Deleted

## 2021-03-08 DIAGNOSIS — Z5181 Encounter for therapeutic drug level monitoring: Secondary | ICD-10-CM

## 2021-03-08 DIAGNOSIS — I480 Paroxysmal atrial fibrillation: Secondary | ICD-10-CM

## 2021-03-08 LAB — POCT INR: INR: 2.4 (ref 2.0–3.0)

## 2021-03-08 NOTE — Patient Instructions (Signed)
Continue warfarin 2 tablets daily except 1 1/2 tablets on Sundays, Tuesdays and Thursdays  Recheck INR in 4 wks   Coumadin Clinic [phone] Pt was on Xeloda  Pt finished chemo infusions. Last infusion is on 12/8

## 2021-03-21 DIAGNOSIS — I10 Essential (primary) hypertension: Secondary | ICD-10-CM | POA: Diagnosis not present

## 2021-03-21 DIAGNOSIS — E1165 Type 2 diabetes mellitus with hyperglycemia: Secondary | ICD-10-CM | POA: Diagnosis not present

## 2021-03-30 ENCOUNTER — Other Ambulatory Visit: Payer: Self-pay

## 2021-03-30 ENCOUNTER — Ambulatory Visit (HOSPITAL_COMMUNITY)
Admission: RE | Admit: 2021-03-30 | Discharge: 2021-03-30 | Disposition: A | Discharge status: Home / Self Care | Payer: Medicare HMO | Source: Ambulatory Visit | Attending: Hematology | Admitting: Hematology

## 2021-03-30 ENCOUNTER — Inpatient Hospital Stay (HOSPITAL_COMMUNITY): Payer: Medicare HMO | Attending: Hematology

## 2021-03-30 DIAGNOSIS — I10 Essential (primary) hypertension: Secondary | ICD-10-CM | POA: Insufficient documentation

## 2021-03-30 DIAGNOSIS — Z9221 Personal history of antineoplastic chemotherapy: Secondary | ICD-10-CM | POA: Insufficient documentation

## 2021-03-30 DIAGNOSIS — N200 Calculus of kidney: Secondary | ICD-10-CM | POA: Diagnosis not present

## 2021-03-30 DIAGNOSIS — K760 Fatty (change of) liver, not elsewhere classified: Secondary | ICD-10-CM | POA: Diagnosis not present

## 2021-03-30 DIAGNOSIS — G4700 Insomnia, unspecified: Secondary | ICD-10-CM | POA: Insufficient documentation

## 2021-03-30 DIAGNOSIS — I4891 Unspecified atrial fibrillation: Secondary | ICD-10-CM | POA: Insufficient documentation

## 2021-03-30 DIAGNOSIS — C182 Malignant neoplasm of ascending colon: Secondary | ICD-10-CM | POA: Diagnosis not present

## 2021-03-30 DIAGNOSIS — Z7901 Long term (current) use of anticoagulants: Secondary | ICD-10-CM | POA: Insufficient documentation

## 2021-03-30 DIAGNOSIS — E119 Type 2 diabetes mellitus without complications: Secondary | ICD-10-CM | POA: Insufficient documentation

## 2021-03-30 DIAGNOSIS — E1142 Type 2 diabetes mellitus with diabetic polyneuropathy: Secondary | ICD-10-CM | POA: Insufficient documentation

## 2021-03-30 DIAGNOSIS — Z79899 Other long term (current) drug therapy: Secondary | ICD-10-CM | POA: Insufficient documentation

## 2021-03-30 DIAGNOSIS — R197 Diarrhea, unspecified: Secondary | ICD-10-CM | POA: Insufficient documentation

## 2021-03-30 DIAGNOSIS — K219 Gastro-esophageal reflux disease without esophagitis: Secondary | ICD-10-CM | POA: Insufficient documentation

## 2021-03-30 DIAGNOSIS — Z800 Family history of malignant neoplasm of digestive organs: Secondary | ICD-10-CM | POA: Insufficient documentation

## 2021-03-30 DIAGNOSIS — Z801 Family history of malignant neoplasm of trachea, bronchus and lung: Secondary | ICD-10-CM | POA: Insufficient documentation

## 2021-03-30 DIAGNOSIS — I700 Atherosclerosis of aorta: Secondary | ICD-10-CM | POA: Insufficient documentation

## 2021-03-30 DIAGNOSIS — Z1509 Genetic susceptibility to other malignant neoplasm: Secondary | ICD-10-CM | POA: Insufficient documentation

## 2021-03-30 DIAGNOSIS — R200 Anesthesia of skin: Secondary | ICD-10-CM | POA: Insufficient documentation

## 2021-03-30 DIAGNOSIS — R5383 Other fatigue: Secondary | ICD-10-CM | POA: Insufficient documentation

## 2021-03-30 DIAGNOSIS — C642 Malignant neoplasm of left kidney, except renal pelvis: Secondary | ICD-10-CM | POA: Insufficient documentation

## 2021-03-30 DIAGNOSIS — Z87442 Personal history of urinary calculi: Secondary | ICD-10-CM | POA: Insufficient documentation

## 2021-03-30 DIAGNOSIS — K4020 Bilateral inguinal hernia, without obstruction or gangrene, not specified as recurrent: Secondary | ICD-10-CM | POA: Diagnosis not present

## 2021-03-30 DIAGNOSIS — E785 Hyperlipidemia, unspecified: Secondary | ICD-10-CM | POA: Insufficient documentation

## 2021-03-30 LAB — COMPREHENSIVE METABOLIC PANEL
ALT: 24 U/L (ref 0–44)
AST: 21 U/L (ref 15–41)
Albumin: 4.8 g/dL (ref 3.5–5.0)
Alkaline Phosphatase: 103 U/L (ref 38–126)
Anion gap: 9 (ref 5–15)
BUN: 21 mg/dL (ref 8–23)
CO2: 30 mmol/L (ref 22–32)
Calcium: 9.8 mg/dL (ref 8.9–10.3)
Chloride: 101 mmol/L (ref 98–111)
Creatinine, Ser: 1.21 mg/dL (ref 0.61–1.24)
GFR, Estimated: >60 mL/min (ref >60)
Glucose, Bld: 113 mg/dL — ABNORMAL HIGH (ref 70–99)
Potassium: 4.9 mmol/L (ref 3.5–5.1)
Sodium: 140 mmol/L (ref 135–145)
Total Bilirubin: 2.6 mg/dL — ABNORMAL HIGH (ref 0.3–1.2)
Total Protein: 8.4 g/dL — ABNORMAL HIGH (ref 6.5–8.1)

## 2021-03-30 LAB — CBC WITH DIFFERENTIAL/PLATELET
Abs Immature Granulocytes: 0.04 10*3/uL (ref 0.00–0.07)
Basophils Absolute: 0 10*3/uL (ref 0.0–0.1)
Basophils Relative: 1 %
Eosinophils Absolute: 0.1 10*3/uL (ref 0.0–0.5)
Eosinophils Relative: 3 %
HCT: 42.7 % (ref 39.0–52.0)
Hemoglobin: 14.1 g/dL (ref 13.0–17.0)
Immature Granulocytes: 1 %
Lymphocytes Relative: 14 %
Lymphs Abs: 0.7 10*3/uL (ref 0.7–4.0)
MCH: 30.9 pg (ref 26.0–34.0)
MCHC: 33 g/dL (ref 30.0–36.0)
MCV: 93.6 fL (ref 80.0–100.0)
Monocytes Absolute: 0.4 10*3/uL (ref 0.1–1.0)
Monocytes Relative: 8 %
Neutro Abs: 3.6 10*3/uL (ref 1.7–7.7)
Neutrophils Relative %: 73 %
Platelets: 224 10*3/uL (ref 150–400)
RBC: 4.56 MIL/uL (ref 4.22–5.81)
RDW: 14.1 % (ref 11.5–15.5)
WBC: 4.9 10*3/uL (ref 4.0–10.5)
nRBC: 0 % (ref 0.0–0.2)

## 2021-03-30 MED ORDER — IOHEXOL 300 MG/ML  SOLN
100.0000 mL | Freq: Once | INTRAMUSCULAR | Status: AC | PRN
  Administered 2021-03-30: 12:00:00 100 mL via INTRAVENOUS

## 2021-03-31 LAB — CEA: CEA: 1 ng/mL (ref 0.0–4.7)

## 2021-04-06 ENCOUNTER — Other Ambulatory Visit: Payer: Self-pay

## 2021-04-06 ENCOUNTER — Inpatient Hospital Stay (HOSPITAL_COMMUNITY): Payer: Medicare HMO | Admitting: Hematology

## 2021-04-06 VITALS — BP 117/72 | HR 59 | Temp 98.4°F | Resp 18 | Ht >= 80 in | Wt 295.3 lb

## 2021-04-06 DIAGNOSIS — E1142 Type 2 diabetes mellitus with diabetic polyneuropathy: Secondary | ICD-10-CM | POA: Diagnosis not present

## 2021-04-06 DIAGNOSIS — I10 Essential (primary) hypertension: Secondary | ICD-10-CM | POA: Diagnosis not present

## 2021-04-06 DIAGNOSIS — Z7901 Long term (current) use of anticoagulants: Secondary | ICD-10-CM | POA: Diagnosis not present

## 2021-04-06 DIAGNOSIS — I4891 Unspecified atrial fibrillation: Secondary | ICD-10-CM | POA: Diagnosis not present

## 2021-04-06 DIAGNOSIS — R5383 Other fatigue: Secondary | ICD-10-CM | POA: Diagnosis not present

## 2021-04-06 DIAGNOSIS — R197 Diarrhea, unspecified: Secondary | ICD-10-CM | POA: Diagnosis not present

## 2021-04-06 DIAGNOSIS — Z79899 Other long term (current) drug therapy: Secondary | ICD-10-CM | POA: Diagnosis not present

## 2021-04-06 DIAGNOSIS — I700 Atherosclerosis of aorta: Secondary | ICD-10-CM | POA: Diagnosis not present

## 2021-04-06 DIAGNOSIS — Z87442 Personal history of urinary calculi: Secondary | ICD-10-CM | POA: Diagnosis not present

## 2021-04-06 DIAGNOSIS — E119 Type 2 diabetes mellitus without complications: Secondary | ICD-10-CM | POA: Diagnosis not present

## 2021-04-06 DIAGNOSIS — Z800 Family history of malignant neoplasm of digestive organs: Secondary | ICD-10-CM | POA: Diagnosis not present

## 2021-04-06 DIAGNOSIS — C642 Malignant neoplasm of left kidney, except renal pelvis: Secondary | ICD-10-CM | POA: Diagnosis not present

## 2021-04-06 DIAGNOSIS — N200 Calculus of kidney: Secondary | ICD-10-CM | POA: Diagnosis not present

## 2021-04-06 DIAGNOSIS — G4700 Insomnia, unspecified: Secondary | ICD-10-CM | POA: Diagnosis not present

## 2021-04-06 DIAGNOSIS — Z801 Family history of malignant neoplasm of trachea, bronchus and lung: Secondary | ICD-10-CM | POA: Diagnosis not present

## 2021-04-06 DIAGNOSIS — R200 Anesthesia of skin: Secondary | ICD-10-CM | POA: Diagnosis not present

## 2021-04-06 DIAGNOSIS — Z1509 Genetic susceptibility to other malignant neoplasm: Secondary | ICD-10-CM | POA: Diagnosis not present

## 2021-04-06 DIAGNOSIS — Z9221 Personal history of antineoplastic chemotherapy: Secondary | ICD-10-CM | POA: Diagnosis not present

## 2021-04-06 DIAGNOSIS — K219 Gastro-esophageal reflux disease without esophagitis: Secondary | ICD-10-CM | POA: Diagnosis not present

## 2021-04-06 DIAGNOSIS — C182 Malignant neoplasm of ascending colon: Secondary | ICD-10-CM | POA: Diagnosis not present

## 2021-04-06 DIAGNOSIS — E785 Hyperlipidemia, unspecified: Secondary | ICD-10-CM | POA: Diagnosis not present

## 2021-04-06 NOTE — Progress Notes (Signed)
? ?[facility] ?[address] ?[place], [place] [address] ? ? ?CLINIC:  ?Medical Oncology/Hematology ? ?PCP:  ?Caryl Bis, MD ?[address] [place] [address] ?[phone] ? ? ?REASON FOR VISIT:  ?Follow-up for colon cancer ? ?PRIOR THERAPY: Left nephrectomy by Dr. Gilford Rile on 10/06/2019  ? ?NGS Results: not done ? ?CURRENT THERAPY: Colorectal Xelox every 3 weeks ? ?BRIEF ONCOLOGIC HISTORY:  ?Oncology History  ?Cancer of ascending colon ([place])  ?09/01/2020 Initial Diagnosis  ? Cancer of ascending colon [facility]) ?  ?10/27/2020 -  Chemotherapy  ? Patient is on Treatment Plan : COLORECTAL Xelox ([place]) q21d  ?   ?10/27/2020 Genetic Testing  ? 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 A variant of uncertain significance was identified in the MET and RAD51D genes. Report date is 10/27/2020. ? ? The Common Hereditary Cancers + RNA Panel offered by [facility] includes sequencing, deletion/duplication, and RNA testing of the following 47 genes: APC, ATM, AXIN2, BARD1, BMPR1A, BRCA1, BRCA2, BRIP1, CDH1, CDK4*, CDKN2A (p14ARF)*, CDKN2A (p16INK4a)*, CHEK2, CTNNA1, DICER1, EPCAM (Deletion/duplication testing only), GREM1 (promoter region deletion/duplication testing only), KIT, MEN1, MLH1, MSH2, MSH3, MSH6, MUTYH, NBN, NF1, NHTL1, PALB2, PDGFRA*, PMS2, POLD1, POLE, PTEN, RAD50, RAD51C, RAD51D, SDHB, SDHC, SDHD, SMAD4, SMARCA4. STK11, TP53, TSC1, TSC2, and VHL.  The following genes were evaluated for sequence changes only: SDHA and HOXB13 c.251G>A variant only.  RNA analysis is not performed for the * genes.   ?  ? ? ?CANCER STAGING: ?Cancer Staging  ?Cancer of ascending colon ([place]) ?Staging form: Colon and Rectum, AJCC 8th Edition ?- Clinical stage from 09/01/2020: Stage IIIB (cT3, cN1b, cM0) - Unsigned ? ? ?INTERVAL HISTORY:  ?[name], a 61 y.o. male, returns for routine follow-up of his colon cancer. Bradley Rodriguez was last seen on 01/30/2021.   ? ?Today he reports feeling well. He reports the month after the completion of his treatment the numbness and tingling in his fingers and feet worsened and is now stable. He denies hematuria. He denies residual cold sensitivity.  ? ?REVIEW OF SYSTEMS:  ?Review of Systems  ?Constitutional:  Negative for appetite change and fatigue.  ?Gastrointestinal:  Positive for diarrhea.  ?Genitourinary:  Negative for hematuria.   ?Neurological:  Positive for numbness.  ?Psychiatric/Behavioral:  Positive for sleep disturbance.   ?All other systems reviewed and are negative. ? ?PAST MEDICAL/SURGICAL HISTORY:  ?Past Medical History:  ?Diagnosis Date  ? A-fib ([place])   ? DCCV 2009, 2012, 2014, Nov 2016  ? Anxiety   ? Arthritis   ? Knees, wrist, ankles  ? Cancer ([place]) 07/2019  ? Kidney  ? Colon cancer ([place])   ? Diabetes mellitus without complication ([place])   ? Dysrhythmia 1996  ? A- Fib  ? GERD (gastroesophageal reflux disease)   ? History of kidney stones   ? HX: anticoagulation   ? very remotely and briefly on COumadin aound one of his CV, 2014 on Eliquis had hematuria with renal calculi and stopped  ? Hyperlipidemia   ? patient denies this  ? Hypertension   ? IBS (irritable bowel syndrome)   ? Normal cardiac stress test   ? Normal nuclear stress test , 2001  ? ?Past Surgical History:  ?Procedure Laterality Date  ? BIOPSY  08/19/2020  ? Procedure: BIOPSY;  Surgeon: Harvel Quale, MD;  [facility];  Service: Gastroenterology;;  ? CARDIOVERSION N/A 10/15/2012  ?  Procedure: CARDIOVERSION;  Surgeon: Larey Dresser, MD;  Location: [facility];  Service: Cardiovascular;  Laterality: N/A;  ? CARDIOVERSION N/A 11/26/2014  ? Procedure: CARDIOVERSION;  Surgeon: Arnoldo Lenis, MD;  [facility];  Service: Endoscopy;  Laterality: N/A;  ? CARDIOVERSION N/A 11/15/2015  ? Procedure: CARDIOVERSION;  Surgeon: Jerline Pain, MD;  Location: [facility];  Service: Cardiovascular;  Laterality: N/A;  ? CARDIOVERSION N/A  08/07/2016  ? Procedure: CARDIOVERSION;  Surgeon: Herminio Commons, MD;  [facility];  Service: Cardiovascular;  Laterality: N/A;  ? CARDIOVERSION N/A 09/11/2018  ? Procedure: CARDIOVERSION;  Surgeon: Arnoldo Lenis, MD;  [facility];  Service: Endoscopy;  Laterality: N/A;  ? CARDIOVERSION N/A 01/03/2021  ? Procedure: CARDIOVERSION;  Surgeon: Berniece Salines, DO;  Location: [facility];  Service: Cardiovascular;  Laterality: N/A;  ? CATARACT EXTRACTION W/PHACO Right 01/18/2014  ? Procedure: CATARACT EXTRACTION PHACO AND INTRAOCULAR LENS PLACEMENT (IOC);  Surgeon: Tonny Branch, MD;  [facility];  Service: Ophthalmology;  Laterality: Right;  CDE 9.95  ? COLONOSCOPY WITH PROPOFOL N/A 08/19/2020  ? Procedure: COLONOSCOPY WITH PROPOFOL;  Surgeon: Harvel Quale, MD;  [facility];  Service: Gastroenterology;  Laterality: N/A;  8:20  ? CYSTOSCOPY WITH LITHOLAPAXY N/A 10/06/2019  ? Procedure: CYSTOSCOPY WITH LASER LITHOLAPAXY;  Surgeon: Ceasar Mons, MD;  [facility];  Service: Urology;  Laterality: N/A;  ? ESOPHAGOGASTRODUODENOSCOPY (EGD) WITH PROPOFOL N/A 08/19/2020  ? Procedure: ESOPHAGOGASTRODUODENOSCOPY (EGD) WITH PROPOFOL;  Surgeon: Harvel Quale, MD;  [facility];  Service: Gastroenterology;  Laterality: N/A;  ? EYE SURGERY Right   ? detached retina  ? FOOT SURGERY Left   ? KNEE ARTHROSCOPY Right   ? LAPAROSCOPIC RIGHT HEMI COLECTOMY N/A 09/30/2020  ? Procedure: LAPAROSCOPIC RIGHT HEMI COLECTOMY;  Surgeon: Ileana Roup, MD;  [facility];  Service: General;  Laterality: N/A;  ? LITHOTRIPSY    ? for kidney stones 2002  ? POLYPECTOMY  08/19/2020  ? Procedure: POLYPECTOMY INTESTINAL;  Surgeon: Harvel Quale, MD;  [facility];  Service: Gastroenterology;;  ? POLYPECTOMY  08/19/2020  ? Procedure: POLYPECTOMY;  Surgeon: Harvel Quale, MD;  [facility];  Service: Gastroenterology;;  ? ROBOT  ASSISTED LAPAROSCOPIC NEPHRECTOMY Left 10/06/2019  ? Procedure: XI ROBOTIC ASSISTED LAPAROSCOPIC NEPHRECTOMY WITH ADRENALECTOMY;  Surgeon: Ceasar Mons, MD;  [facility];  Service: Urology;  Laterality: Left;  ? SUBMUCOSAL TATTOO INJECTION  08/19/2020  ? Procedure: SUBMUCOSAL TATTOO INJECTION;  Surgeon: Harvel Quale, MD;  [facility];  Service: Gastroenterology;;  ? TEE WITHOUT CARDIOVERSION N/A 11/26/2014  ? Procedure: TRANSESOPHAGEAL ECHOCARDIOGRAM (TEE) WITH PROPOFOL;  Surgeon: Arnoldo Lenis, MD;  [facility];  Service: Endoscopy;  Laterality: N/A;  ? TEE WITHOUT CARDIOVERSION N/A 11/15/2015  ? Procedure: TRANSESOPHAGEAL ECHOCARDIOGRAM (TEE);  Surgeon: Jerline Pain, MD;  Location: [facility];  Service: Cardiovascular;  Laterality: N/A;  ? TEE WITHOUT CARDIOVERSION N/A 09/11/2018  ? Procedure: TRANSESOPHAGEAL ECHOCARDIOGRAM (TEE) WITH PROPOFOL;  Surgeon: Arnoldo Lenis, MD;  [facility];  Service: Endoscopy;  Laterality: N/A;  ? TEE WITHOUT CARDIOVERSION N/A 01/03/2021  ? Procedure: TRANSESOPHAGEAL ECHOCARDIOGRAM (TEE);  Surgeon: Berniece Salines, DO;  [facility];  Service: Cardiovascular;  Laterality: N/A;  ? VASECTOMY    ? ? ?SOCIAL HISTORY:  ?Social History  ? ?Socioeconomic History  ? Marital status: Married  ?  Spouse name: Not on file  ? Number of children:  Not on file  ? Years of education: Not on file  ? Highest education level: Not on file  ?Occupational History  ? Not on file  ?Tobacco Use  ? Smoking status: Never  ? Smokeless tobacco: Never  ?Vaping Use  ? Vaping Use: Never used  ?Substance and Sexual Activity  ? Alcohol use: No  ?  Alcohol/week: 0.0 standard drinks  ? Drug use: No  ? Sexual activity: Yes  ?  Birth control/protection: Surgical  ?Other Topics Concern  ? Not on file  ?Social History Narrative  ? Not on file  ? ?Social Determinants of Health  ? ?Financial Resource Strain: Low Risk   ? Difficulty of Paying Living Expenses:  Not hard at all  ?Food Insecurity: No Food Insecurity  ? Worried About Charity fundraiser in the Last Year: Never true  ? Ran Out of Food in the Last Year: Never true  ?Transportation Needs: No Transportat

## 2021-04-21 DIAGNOSIS — E7849 Other hyperlipidemia: Secondary | ICD-10-CM | POA: Diagnosis not present

## 2021-04-21 DIAGNOSIS — E1165 Type 2 diabetes mellitus with hyperglycemia: Secondary | ICD-10-CM | POA: Diagnosis not present

## 2021-04-21 DIAGNOSIS — I10 Essential (primary) hypertension: Secondary | ICD-10-CM | POA: Diagnosis not present

## 2021-05-17 ENCOUNTER — Ambulatory Visit: Payer: Self-pay | Admitting: *Deleted

## 2021-05-22 DEATH — deceased

## 2021-05-31 DIAGNOSIS — G4733 Obstructive sleep apnea (adult) (pediatric): Secondary | ICD-10-CM | POA: Diagnosis not present

## 2021-05-31 DIAGNOSIS — E782 Mixed hyperlipidemia: Secondary | ICD-10-CM | POA: Diagnosis not present

## 2021-05-31 DIAGNOSIS — I480 Paroxysmal atrial fibrillation: Secondary | ICD-10-CM | POA: Diagnosis not present

## 2021-05-31 DIAGNOSIS — I10 Essential (primary) hypertension: Secondary | ICD-10-CM | POA: Diagnosis not present

## 2021-05-31 DIAGNOSIS — D500 Iron deficiency anemia secondary to blood loss (chronic): Secondary | ICD-10-CM | POA: Diagnosis not present

## 2021-05-31 DIAGNOSIS — J301 Allergic rhinitis due to pollen: Secondary | ICD-10-CM | POA: Diagnosis not present

## 2021-05-31 DIAGNOSIS — M1000 Idiopathic gout, unspecified site: Secondary | ICD-10-CM | POA: Diagnosis not present

## 2021-05-31 DIAGNOSIS — E7849 Other hyperlipidemia: Secondary | ICD-10-CM | POA: Diagnosis not present

## 2021-05-31 DIAGNOSIS — K580 Irritable bowel syndrome with diarrhea: Secondary | ICD-10-CM | POA: Diagnosis not present

## 2021-05-31 DIAGNOSIS — M1711 Unilateral primary osteoarthritis, right knee: Secondary | ICD-10-CM | POA: Diagnosis not present

## 2021-05-31 DIAGNOSIS — E1142 Type 2 diabetes mellitus with diabetic polyneuropathy: Secondary | ICD-10-CM | POA: Diagnosis not present

## 2021-05-31 DIAGNOSIS — E8881 Metabolic syndrome: Secondary | ICD-10-CM | POA: Diagnosis not present

## 2021-05-31 DIAGNOSIS — C189 Malignant neoplasm of colon, unspecified: Secondary | ICD-10-CM | POA: Diagnosis not present

## 2021-07-01 ENCOUNTER — Other Ambulatory Visit: Payer: Self-pay | Admitting: Nurse Practitioner

## 2021-07-11 ENCOUNTER — Inpatient Hospital Stay (HOSPITAL_COMMUNITY): Payer: Medicare HMO | Attending: Hematology

## 2021-07-11 DIAGNOSIS — Z801 Family history of malignant neoplasm of trachea, bronchus and lung: Secondary | ICD-10-CM | POA: Diagnosis not present

## 2021-07-11 DIAGNOSIS — Z800 Family history of malignant neoplasm of digestive organs: Secondary | ICD-10-CM | POA: Diagnosis not present

## 2021-07-11 DIAGNOSIS — C182 Malignant neoplasm of ascending colon: Secondary | ICD-10-CM | POA: Insufficient documentation

## 2021-07-11 DIAGNOSIS — Z85528 Personal history of other malignant neoplasm of kidney: Secondary | ICD-10-CM | POA: Insufficient documentation

## 2021-07-11 DIAGNOSIS — I10 Essential (primary) hypertension: Secondary | ICD-10-CM | POA: Diagnosis not present

## 2021-07-11 DIAGNOSIS — G629 Polyneuropathy, unspecified: Secondary | ICD-10-CM | POA: Insufficient documentation

## 2021-07-11 DIAGNOSIS — E119 Type 2 diabetes mellitus without complications: Secondary | ICD-10-CM | POA: Insufficient documentation

## 2021-07-11 DIAGNOSIS — Z808 Family history of malignant neoplasm of other organs or systems: Secondary | ICD-10-CM | POA: Insufficient documentation

## 2021-07-11 LAB — CBC WITH DIFFERENTIAL/PLATELET
Abs Immature Granulocytes: 0.03 10*3/uL (ref 0.00–0.07)
Basophils Absolute: 0 10*3/uL (ref 0.0–0.1)
Basophils Relative: 1 %
Eosinophils Absolute: 0.2 10*3/uL (ref 0.0–0.5)
Eosinophils Relative: 4 %
HCT: 41.1 % (ref 39.0–52.0)
Hemoglobin: 13.8 g/dL (ref 13.0–17.0)
Immature Granulocytes: 1 %
Lymphocytes Relative: 15 %
Lymphs Abs: 0.8 10*3/uL (ref 0.7–4.0)
MCH: 29.8 pg (ref 26.0–34.0)
MCHC: 33.6 g/dL (ref 30.0–36.0)
MCV: 88.8 fL (ref 80.0–100.0)
Monocytes Absolute: 0.4 10*3/uL (ref 0.1–1.0)
Monocytes Relative: 9 %
Neutro Abs: 3.7 10*3/uL (ref 1.7–7.7)
Neutrophils Relative %: 70 %
Platelets: 233 10*3/uL (ref 150–400)
RBC: 4.63 MIL/uL (ref 4.22–5.81)
RDW: 13.8 % (ref 11.5–15.5)
WBC: 5.1 10*3/uL (ref 4.0–10.5)
nRBC: 0 % (ref 0.0–0.2)

## 2021-07-11 LAB — COMPREHENSIVE METABOLIC PANEL
ALT: 21 U/L (ref 0–44)
AST: 20 U/L (ref 15–41)
Albumin: 4.4 g/dL (ref 3.5–5.0)
Alkaline Phosphatase: 102 U/L (ref 38–126)
Anion gap: 7 (ref 5–15)
BUN: 24 mg/dL — ABNORMAL HIGH (ref 8–23)
CO2: 25 mmol/L (ref 22–32)
Calcium: 9.6 mg/dL (ref 8.9–10.3)
Chloride: 106 mmol/L (ref 98–111)
Creatinine, Ser: 1.21 mg/dL (ref 0.61–1.24)
GFR, Estimated: >60 mL/min (ref >60)
Glucose, Bld: 126 mg/dL — ABNORMAL HIGH (ref 70–99)
Potassium: 4.5 mmol/L (ref 3.5–5.1)
Sodium: 138 mmol/L (ref 135–145)
Total Bilirubin: 2.1 mg/dL — ABNORMAL HIGH (ref 0.3–1.2)
Total Protein: 7.6 g/dL (ref 6.5–8.1)

## 2021-07-11 LAB — MAGNESIUM: Magnesium: 1.9 mg/dL (ref 1.7–2.4)

## 2021-07-12 LAB — CEA: CEA: 1.4 ng/mL (ref 0.0–4.7)

## 2021-07-18 ENCOUNTER — Inpatient Hospital Stay (HOSPITAL_COMMUNITY): Payer: Medicare HMO | Admitting: Hematology

## 2021-07-18 VITALS — BP 147/82 | HR 63 | Temp 98.3°F | Resp 20 | Ht 78.74 in | Wt 304.0 lb

## 2021-07-18 DIAGNOSIS — C182 Malignant neoplasm of ascending colon: Secondary | ICD-10-CM

## 2021-07-18 DIAGNOSIS — I10 Essential (primary) hypertension: Secondary | ICD-10-CM | POA: Diagnosis not present

## 2021-07-18 DIAGNOSIS — Z800 Family history of malignant neoplasm of digestive organs: Secondary | ICD-10-CM | POA: Diagnosis not present

## 2021-07-18 DIAGNOSIS — E119 Type 2 diabetes mellitus without complications: Secondary | ICD-10-CM | POA: Diagnosis not present

## 2021-07-18 DIAGNOSIS — Z808 Family history of malignant neoplasm of other organs or systems: Secondary | ICD-10-CM | POA: Diagnosis not present

## 2021-07-18 DIAGNOSIS — Z801 Family history of malignant neoplasm of trachea, bronchus and lung: Secondary | ICD-10-CM | POA: Diagnosis not present

## 2021-07-18 DIAGNOSIS — Z85528 Personal history of other malignant neoplasm of kidney: Secondary | ICD-10-CM | POA: Diagnosis not present

## 2021-07-18 DIAGNOSIS — G629 Polyneuropathy, unspecified: Secondary | ICD-10-CM | POA: Diagnosis not present

## 2021-08-11 ENCOUNTER — Encounter (INDEPENDENT_AMBULATORY_CARE_PROVIDER_SITE_OTHER): Payer: Self-pay | Admitting: *Deleted

## 2021-09-01 ENCOUNTER — Other Ambulatory Visit: Payer: Self-pay

## 2021-09-05 DIAGNOSIS — M7061 Trochanteric bursitis, right hip: Secondary | ICD-10-CM | POA: Diagnosis not present

## 2021-09-05 DIAGNOSIS — M1611 Unilateral primary osteoarthritis, right hip: Secondary | ICD-10-CM | POA: Diagnosis not present

## 2021-09-05 DIAGNOSIS — Z6833 Body mass index (BMI) 33.0-33.9, adult: Secondary | ICD-10-CM | POA: Diagnosis not present

## 2021-09-05 DIAGNOSIS — M25551 Pain in right hip: Secondary | ICD-10-CM | POA: Diagnosis not present

## 2021-09-27 DIAGNOSIS — D529 Folate deficiency anemia, unspecified: Secondary | ICD-10-CM | POA: Diagnosis not present

## 2021-09-27 DIAGNOSIS — E782 Mixed hyperlipidemia: Secondary | ICD-10-CM | POA: Diagnosis not present

## 2021-09-27 DIAGNOSIS — E7849 Other hyperlipidemia: Secondary | ICD-10-CM | POA: Diagnosis not present

## 2021-09-27 DIAGNOSIS — D519 Vitamin B12 deficiency anemia, unspecified: Secondary | ICD-10-CM | POA: Diagnosis not present

## 2021-09-27 DIAGNOSIS — D649 Anemia, unspecified: Secondary | ICD-10-CM | POA: Diagnosis not present

## 2021-09-27 DIAGNOSIS — E1165 Type 2 diabetes mellitus with hyperglycemia: Secondary | ICD-10-CM | POA: Diagnosis not present

## 2021-10-04 ENCOUNTER — Other Ambulatory Visit (INDEPENDENT_AMBULATORY_CARE_PROVIDER_SITE_OTHER): Payer: Self-pay

## 2021-10-04 DIAGNOSIS — Z86010 Personal history of colonic polyps: Secondary | ICD-10-CM

## 2021-10-06 DIAGNOSIS — K580 Irritable bowel syndrome with diarrhea: Secondary | ICD-10-CM | POA: Diagnosis not present

## 2021-10-06 DIAGNOSIS — D500 Iron deficiency anemia secondary to blood loss (chronic): Secondary | ICD-10-CM | POA: Diagnosis not present

## 2021-10-06 DIAGNOSIS — I10 Essential (primary) hypertension: Secondary | ICD-10-CM | POA: Diagnosis not present

## 2021-10-06 DIAGNOSIS — F331 Major depressive disorder, recurrent, moderate: Secondary | ICD-10-CM | POA: Diagnosis not present

## 2021-10-06 DIAGNOSIS — I480 Paroxysmal atrial fibrillation: Secondary | ICD-10-CM | POA: Diagnosis not present

## 2021-10-06 DIAGNOSIS — E7849 Other hyperlipidemia: Secondary | ICD-10-CM | POA: Diagnosis not present

## 2021-10-06 DIAGNOSIS — C189 Malignant neoplasm of colon, unspecified: Secondary | ICD-10-CM | POA: Diagnosis not present

## 2021-10-06 DIAGNOSIS — E8881 Metabolic syndrome: Secondary | ICD-10-CM | POA: Diagnosis not present

## 2021-10-06 DIAGNOSIS — E1142 Type 2 diabetes mellitus with diabetic polyneuropathy: Secondary | ICD-10-CM | POA: Diagnosis not present

## 2021-10-16 ENCOUNTER — Telehealth (INDEPENDENT_AMBULATORY_CARE_PROVIDER_SITE_OTHER): Payer: Self-pay

## 2021-10-16 ENCOUNTER — Encounter (INDEPENDENT_AMBULATORY_CARE_PROVIDER_SITE_OTHER): Payer: Self-pay

## 2021-10-16 MED ORDER — PEG 3350-KCL-NA BICARB-NACL 420 G PO SOLR: 4000.0000 mL | ORAL | 0 refills | Status: DC

## 2021-10-16 NOTE — Telephone Encounter (Signed)
Referring MD/PCP: Dr Gar Ponto  Procedure: Tcs Recall  Reason/Indication:  Hx of polyps   Has patient had this procedure before?  Yes last year  If so, when, by whom and where?    Is there a family history of colon cancer?  yes  Who?  What age when diagnosed?  Myself, Mom & Dad  Is patient diabetic? If yes, Type 1 or Type 2   yes type 2       Does patient have prosthetic heart valve or mechanical valve?  No   Do you have a pacemaker/defibrillator?  No   Has patient ever had endocarditis/atrial fibrillation? Yes A Fib  Does patient use oxygen? No   Has patient had joint replacement within last 12 months?  No   Is patient constipated or do they take laxatives? No   Does patient have a history of alcohol/drug use?  No   Have you had a stroke/heart attack last 6 mths? No   Do you take medicine for weight loss?  No   For male patients,: have you had a hysterectomy n/a                      are you post menopausal n/a                      do you still have your menstrual cycle n/a  Is patient on blood thinner such as Coumadin, Plavix and/or Aspirin? No   Medications: Clucocil bid, metoprolol Tartrate 100 mg bid, Ferrex 150 mg cap bid, Atorvastatin 20 mg tab daily, Losartan 100 mg daily, amlodipine 5 mg daily, duloxetine 20 mg cap daily  Allergies: Quinadine and Lisinopril   Medication Adjustment per Dr Jenetta Downer none  Procedure date & time: 11/15/21 at 1:30

## 2021-10-16 NOTE — Telephone Encounter (Signed)
Bradley Rodriguez Bradley Rodriguez, CMA  ?

## 2021-10-17 ENCOUNTER — Ambulatory Visit (HOSPITAL_COMMUNITY)
Admission: RE | Admit: 2021-10-17 | Discharge: 2021-10-17 | Disposition: A | Discharge status: Home / Self Care | Payer: Medicare HMO | Source: Ambulatory Visit | Attending: Hematology | Admitting: Hematology

## 2021-10-17 ENCOUNTER — Inpatient Hospital Stay: Payer: Medicare HMO | Attending: Hematology

## 2021-10-17 DIAGNOSIS — C182 Malignant neoplasm of ascending colon: Secondary | ICD-10-CM | POA: Diagnosis not present

## 2021-10-17 DIAGNOSIS — K760 Fatty (change of) liver, not elsewhere classified: Secondary | ICD-10-CM | POA: Diagnosis not present

## 2021-10-17 DIAGNOSIS — C189 Malignant neoplasm of colon, unspecified: Secondary | ICD-10-CM | POA: Diagnosis not present

## 2021-10-17 DIAGNOSIS — N200 Calculus of kidney: Secondary | ICD-10-CM | POA: Diagnosis not present

## 2021-10-17 LAB — COMPREHENSIVE METABOLIC PANEL
ALT: 22 U/L (ref 0–44)
AST: 18 U/L (ref 15–41)
Albumin: 4.4 g/dL (ref 3.5–5.0)
Alkaline Phosphatase: 111 U/L (ref 38–126)
Anion gap: 10 (ref 5–15)
BUN: 20 mg/dL (ref 8–23)
CO2: 25 mmol/L (ref 22–32)
Calcium: 9.3 mg/dL (ref 8.9–10.3)
Chloride: 105 mmol/L (ref 98–111)
Creatinine, Ser: 1.19 mg/dL (ref 0.61–1.24)
GFR, Estimated: >60 mL/min (ref >60)
Glucose, Bld: 110 mg/dL — ABNORMAL HIGH (ref 70–99)
Potassium: 4.2 mmol/L (ref 3.5–5.1)
Sodium: 140 mmol/L (ref 135–145)
Total Bilirubin: 1.7 mg/dL — ABNORMAL HIGH (ref 0.3–1.2)
Total Protein: 7.7 g/dL (ref 6.5–8.1)

## 2021-10-17 LAB — CBC WITH DIFFERENTIAL/PLATELET
Abs Immature Granulocytes: 0.07 10*3/uL (ref 0.00–0.07)
Basophils Absolute: 0 10*3/uL (ref 0.0–0.1)
Basophils Relative: 1 %
Eosinophils Absolute: 0.1 10*3/uL (ref 0.0–0.5)
Eosinophils Relative: 2 %
HCT: 42.6 % (ref 39.0–52.0)
Hemoglobin: 14.3 g/dL (ref 13.0–17.0)
Immature Granulocytes: 2 %
Lymphocytes Relative: 17 %
Lymphs Abs: 0.8 10*3/uL (ref 0.7–4.0)
MCH: 29.9 pg (ref 26.0–34.0)
MCHC: 33.6 g/dL (ref 30.0–36.0)
MCV: 89.1 fL (ref 80.0–100.0)
Monocytes Absolute: 0.4 10*3/uL (ref 0.1–1.0)
Monocytes Relative: 9 %
Neutro Abs: 3.3 10*3/uL (ref 1.7–7.7)
Neutrophils Relative %: 69 %
Platelets: 252 10*3/uL (ref 150–400)
RBC: 4.78 MIL/uL (ref 4.22–5.81)
RDW: 14.1 % (ref 11.5–15.5)
WBC: 4.7 10*3/uL (ref 4.0–10.5)
nRBC: 0 % (ref 0.0–0.2)

## 2021-10-17 MED ORDER — IOHEXOL 300 MG/ML  SOLN
100.0000 mL | Freq: Once | INTRAMUSCULAR | Status: AC | PRN
  Administered 2021-10-17: 100 mL via INTRAVENOUS

## 2021-10-19 LAB — CEA: CEA: 2.3 ng/mL (ref 0.0–4.7)

## 2021-10-24 ENCOUNTER — Encounter: Payer: Self-pay | Admitting: Hematology

## 2021-10-24 ENCOUNTER — Inpatient Hospital Stay: Payer: Medicare HMO | Attending: Hematology | Admitting: Hematology

## 2021-10-24 VITALS — BP 127/76 | HR 57 | Temp 98.0°F | Resp 18 | Wt 293.2 lb

## 2021-10-24 DIAGNOSIS — K219 Gastro-esophageal reflux disease without esophagitis: Secondary | ICD-10-CM | POA: Diagnosis not present

## 2021-10-24 DIAGNOSIS — I10 Essential (primary) hypertension: Secondary | ICD-10-CM | POA: Diagnosis not present

## 2021-10-24 DIAGNOSIS — C182 Malignant neoplasm of ascending colon: Secondary | ICD-10-CM | POA: Insufficient documentation

## 2021-10-24 DIAGNOSIS — C642 Malignant neoplasm of left kidney, except renal pelvis: Secondary | ICD-10-CM | POA: Diagnosis not present

## 2021-10-24 DIAGNOSIS — G629 Polyneuropathy, unspecified: Secondary | ICD-10-CM | POA: Insufficient documentation

## 2021-10-24 DIAGNOSIS — Z9221 Personal history of antineoplastic chemotherapy: Secondary | ICD-10-CM | POA: Diagnosis not present

## 2021-10-24 DIAGNOSIS — Z801 Family history of malignant neoplasm of trachea, bronchus and lung: Secondary | ICD-10-CM | POA: Diagnosis not present

## 2021-10-24 DIAGNOSIS — Z79899 Other long term (current) drug therapy: Secondary | ICD-10-CM | POA: Insufficient documentation

## 2021-10-24 DIAGNOSIS — R197 Diarrhea, unspecified: Secondary | ICD-10-CM | POA: Insufficient documentation

## 2021-10-24 DIAGNOSIS — E119 Type 2 diabetes mellitus without complications: Secondary | ICD-10-CM | POA: Insufficient documentation

## 2021-10-24 DIAGNOSIS — I4891 Unspecified atrial fibrillation: Secondary | ICD-10-CM | POA: Insufficient documentation

## 2021-10-24 DIAGNOSIS — Z800 Family history of malignant neoplasm of digestive organs: Secondary | ICD-10-CM | POA: Insufficient documentation

## 2021-10-24 DIAGNOSIS — Z85528 Personal history of other malignant neoplasm of kidney: Secondary | ICD-10-CM | POA: Insufficient documentation

## 2021-10-24 DIAGNOSIS — E785 Hyperlipidemia, unspecified: Secondary | ICD-10-CM | POA: Insufficient documentation

## 2021-10-24 NOTE — Patient Instructions (Signed)
[facility]  Discharge Instructions  You were seen and examined today by Dr. Katragadda.  Dr. Katragadda discussed your most recent lab work and CT scan which revealed that everything looks good.  Follow-up as scheduled in 3 months with labs.    Thank you for choosing [facility] to provide your oncology and hematology care.   To afford each patient quality time with our provider, please arrive at least 15 minutes before your scheduled appointment time. You may need to reschedule your appointment if you arrive late (10 or more minutes). Arriving late affects you and other patients whose appointments are after yours.  Also, if you miss three or more appointments without notifying the office, you may be dismissed from the clinic at the provider's discretion.    Again, thank you for choosing [facility].  Our hope is that these requests will decrease the amount of time that you wait before being seen by our physicians.   If you have a lab appointment with the Cancer Center please come in thru the Main Entrance and check in at the main information desk.           _____________________________________________________________  Should you have questions after your visit to [facility], please contact our office at [phone] and follow the prompts.  Our office hours are 8:00 a.m. to 4:30 p.m. Monday - Thursday and 8:00 a.m. to 2:30 p.m. Friday.  Please note that voicemails left after 4:00 p.m. may not be returned until the following business day.  We are closed weekends and all major holidays.  You do have access to a nurse 24-7, just call the main number to the clinic [phone] and do not press any options, hold on the line and a nurse will answer the phone.    For prescription refill requests, have your pharmacy contact our office and allow 72 hours.    Masks are optional in the cancer centers. If you would  like for your care team to wear a mask while they are taking care of you, please let them know. You may have one support person who is at least 63 years old accompany you for your appointments.  

## 2021-11-03 NOTE — Progress Notes (Signed)
[facility], [place] [address]   CLINIC:  Medical Oncology/Hematology  PCP:  Bradley Bis, MD [address] [place] [address] [phone]   REASON FOR VISIT:  Follow-up for colon cancer  PRIOR THERAPY: Left nephrectomy by Dr. Gilford Rile on 10/06/2019 2.  Adjuvant chemotherapy with 4 cycles of XELOX completed for colon cancer.  NGS Results: not done  CURRENT THERAPY: Surveillance.  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 A variant of uncertain significance was identified in the MET and RAD51D genes. Report date is 10/27/2020.   The Common Hereditary Cancers + RNA Panel offered by [facility] includes sequencing, deletion/duplication, and RNA testing of the following 47 genes: APC, ATM, AXIN2, BARD1, BMPR1A, BRCA1, BRCA2, BRIP1, CDH1, CDK4*, CDKN2A (p14ARF)*, CDKN2A (p16INK4a)*, CHEK2, CTNNA1, DICER1, EPCAM (Deletion/duplication testing only), GREM1 (promoter region deletion/duplication testing only), KIT, MEN1, MLH1, MSH2, MSH3, MSH6, MUTYH, NBN, NF1, NHTL1, PALB2, PDGFRA*, PMS2, POLD1, POLE, PTEN, RAD50, RAD51C, RAD51D, SDHB, SDHC, SDHD, SMAD4, SMARCA4. STK11, TP53, TSC1, TSC2, and VHL.  The following genes were evaluated for sequence changes only: SDHA and HOXB13 c.251G>A variant only.  RNA analysis is not performed for the * genes.       CANCER STAGING:  Cancer Staging  Cancer of ascending colon [facility]) Staging form: Colon and Rectum, AJCC 8th Edition - Clinical stage from 09/01/2020: Stage IIIB (cT3, cN1b, cM0) - Unsigned   INTERVAL HISTORY:  [name], a 60 y.o. male, seen for follow-up  of colon cancer.  Cymbalta is helping with his neuropathy.  He has occasional diarrhea which is stable.  Denies any new onset pains.  Denies any bleeding per rectum or melena.  REVIEW OF SYSTEMS:  Review of Systems  Constitutional:  Negative for appetite change.  Gastrointestinal:  Positive for diarrhea. Negative for constipation.  Genitourinary:  Negative for difficulty urinating and hematuria.   Neurological:  Positive for numbness (fingertips and toes - improved).  All other systems reviewed and are negative.   PAST MEDICAL/SURGICAL HISTORY:  Past Medical History:  Diagnosis Date   A-fib [employer])    DCCV 2009, 2012, 2014, Nov 2016   Anxiety    Arthritis    Knees, wrist, ankles   Cancer ([place]) 07/2019   Kidney   Colon cancer ([place])    Diabetes mellitus without complication ([place])    Dysrhythmia 1996   A- Fib   GERD (gastroesophageal reflux disease)    History of kidney stones    HX: anticoagulation    very remotely and briefly on COumadin aound one of his CV, 2014 on Eliquis had hematuria with renal calculi and stopped   Hyperlipidemia    patient denies this   Hypertension    IBS (irritable bowel syndrome)    Normal cardiac stress test    Normal nuclear stress test , 2001   Past Surgical History:  Procedure Laterality Date   BIOPSY  08/19/2020   Procedure: BIOPSY;  Surgeon: Harvel Quale, MD;  [facility];  Service: Gastroenterology;;   CARDIOVERSION N/A 10/15/2012   Procedure: CARDIOVERSION;  Surgeon: Larey Dresser,  MD;  Location: [place];  Service: Cardiovascular;  Laterality: N/A;   CARDIOVERSION N/A 11/26/2014   Procedure: CARDIOVERSION;  Surgeon: Arnoldo Lenis, MD;  [facility];  Service: Endoscopy;  Laterality: N/A;   CARDIOVERSION N/A 11/15/2015   Procedure: CARDIOVERSION;  Surgeon: Jerline Pain, MD;  Location: [place];  Service: Cardiovascular;  Laterality: N/A;   CARDIOVERSION N/A 08/07/2016   Procedure: CARDIOVERSION;   Surgeon: Herminio Commons, MD;  [facility];  Service: Cardiovascular;  Laterality: N/A;   CARDIOVERSION N/A 09/11/2018   Procedure: CARDIOVERSION;  Surgeon: Arnoldo Lenis, MD;  [facility];  Service: Endoscopy;  Laterality: N/A;   CARDIOVERSION N/A 01/03/2021   Procedure: CARDIOVERSION;  Surgeon: Berniece Salines, DO;  Location: [place];  Service: Cardiovascular;  Laterality: N/A;   CATARACT EXTRACTION W/PHACO Right 01/18/2014   Procedure: CATARACT EXTRACTION PHACO AND INTRAOCULAR LENS PLACEMENT (IOC);  Surgeon: Tonny Branch, MD;  [facility];  Service: Ophthalmology;  Laterality: Right;  CDE 9.95   COLONOSCOPY WITH PROPOFOL N/A 08/19/2020   Procedure: COLONOSCOPY WITH PROPOFOL;  Surgeon: Harvel Quale, MD;  [facility];  Service: Gastroenterology;  Laterality: N/A;  8:20   CYSTOSCOPY WITH LITHOLAPAXY N/A 10/06/2019   Procedure: CYSTOSCOPY WITH LASER LITHOLAPAXY;  Surgeon: Ceasar Mons, MD;  [facility];  Service: Urology;  Laterality: N/A;   ESOPHAGOGASTRODUODENOSCOPY (EGD) WITH PROPOFOL N/A 08/19/2020   Procedure: ESOPHAGOGASTRODUODENOSCOPY (EGD) WITH PROPOFOL;  Surgeon: Harvel Quale, MD;  [facility];  Service: Gastroenterology;  Laterality: N/A;   EYE SURGERY Right    detached retina   FOOT SURGERY Left    KNEE ARTHROSCOPY Right    LAPAROSCOPIC RIGHT HEMI COLECTOMY N/A 09/30/2020   Procedure: LAPAROSCOPIC RIGHT HEMI COLECTOMY;  Surgeon: Ileana Roup, MD;  [facility];  Service: General;  Laterality: N/A;   LITHOTRIPSY     for kidney stones 2002   POLYPECTOMY  08/19/2020   Procedure: POLYPECTOMY INTESTINAL;  Surgeon: Harvel Quale, MD;  [facility];  Service: Gastroenterology;;   POLYPECTOMY  08/19/2020   Procedure: POLYPECTOMY;  Surgeon: Harvel Quale, MD;  [facility];  Service: Gastroenterology;;   ROBOT ASSISTED LAPAROSCOPIC NEPHRECTOMY Left  10/06/2019   Procedure: XI ROBOTIC ASSISTED LAPAROSCOPIC NEPHRECTOMY WITH ADRENALECTOMY;  Surgeon: Ceasar Mons, MD;  [facility];  Service: Urology;  Laterality: Left;   SUBMUCOSAL TATTOO INJECTION  08/19/2020   Procedure: SUBMUCOSAL TATTOO INJECTION;  Surgeon: Harvel Quale, MD;  [facility];  Service: Gastroenterology;;   TEE WITHOUT CARDIOVERSION N/A 11/26/2014   Procedure: TRANSESOPHAGEAL ECHOCARDIOGRAM (TEE) WITH PROPOFOL;  Surgeon: Arnoldo Lenis, MD;  [facility];  Service: Endoscopy;  Laterality: N/A;   TEE WITHOUT CARDIOVERSION N/A 11/15/2015   Procedure: TRANSESOPHAGEAL ECHOCARDIOGRAM (TEE);  Surgeon: Jerline Pain, MD;  Location: [place];  Service: Cardiovascular;  Laterality: N/A;   TEE WITHOUT CARDIOVERSION N/A 09/11/2018   Procedure: TRANSESOPHAGEAL ECHOCARDIOGRAM (TEE) WITH PROPOFOL;  Surgeon: Arnoldo Lenis, MD;  [facility];  Service: Endoscopy;  Laterality: N/A;   TEE WITHOUT CARDIOVERSION N/A 01/03/2021   Procedure: TRANSESOPHAGEAL ECHOCARDIOGRAM (TEE);  Surgeon: Berniece Salines, DO;  [facility];  Service: Cardiovascular;  Laterality: N/A;   VASECTOMY      SOCIAL HISTORY:  Social History   Socioeconomic History   Marital status: Married    Spouse name: Not on file   Number of children: Not on file   Years of  education: Not on file   Highest education level: Not on file  Occupational History   Not on file  Tobacco Use   Smoking status: Never   Smokeless tobacco: Never  Vaping Use   Vaping Use: Never used  Substance and Sexual Activity   Alcohol use: No    Alcohol/week: 0.0 standard drinks of alcohol   Drug use: No   Sexual activity: Yes    Birth control/protection: Surgical  Other Topics Concern   Not on file  Social History Narrative   Not on file   Social Determinants of Health   Financial Resource Strain: Low Risk  (09/01/2020)   Overall Financial Resource Strain (CARDIA)     Difficulty of Paying Living Expenses: Not hard at all  Food Insecurity: No Food Insecurity (09/01/2020)   Hunger Vital Sign    Worried About Running Out of Food in the Last Year: Never true    Ran Out of Food in the Last Year: Never true  Transportation Needs: No Transportation Needs (09/01/2020)   PRAPARE - Hydrologist (Medical): No    Lack of Transportation (Non-Medical): No  Physical Activity: Inactive (09/01/2020)   Exercise Vital Sign    Days of Exercise per Week: 0 days    Minutes of Exercise per Session: 0 min  Stress: No Stress Concern Present (09/01/2020)   [place]    Feeling of Stress : Not at all  Social Connections: Moderately Integrated (09/01/2020)   Social Connection and Isolation Panel [NHANES]    Frequency of Communication with Friends and Family: More than three times a week    Frequency of Social Gatherings with Friends and Family: More than three times a week    Attends Religious Services: More than 4 times per year    Active Member of [employer] or Organizations: No    Attends Archivist Meetings: Never    Marital Status: Married  Human resources officer Violence: Not At Risk (09/01/2020)   Humiliation, Afraid, Rape, and Kick questionnaire    Fear of Current or Ex-Partner: No    Emotionally Abused: No    Physically Abused: No    Sexually Abused: No    FAMILY HISTORY:  Family History  Problem Relation Age of Onset   Diabetes Mother    Hypertension Mother    Colon cancer Mother        dx. 80s   Diabetes Father    Colon cancer Father        dx. 15s   Lung cancer Maternal Grandmother        smoked   Bone cancer Maternal Grandfather        dx. 80s   Hypertension Other     CURRENT MEDICATIONS:  Current Outpatient Medications  Medication Sig Dispense Refill   amLODipine (NORVASC) 5 MG tablet TAKE ONE TABLET BY MOUTH EVERY DAY 90 tablet 3   atorvastatin (LIPITOR)  20 MG tablet Take 20 mg by mouth every evening.      Bioflavonoid Products (ESTER C PO) Take 1,000 mg by mouth daily.     Calcium Citrate-Vitamin D (CITRACAL + D PO) Take 1 tablet by mouth daily.     cholecalciferol (VITAMIN D3) 25 MCG (1000 UNIT) tablet Take 1,000 Units by mouth daily.     DULoxetine (CYMBALTA) 30 MG capsule Take 30 mg by mouth daily.     iron polysaccharides (NIFEREX) 150 MG capsule Take 150 mg by  mouth 2 (two) times daily.     losartan (COZAAR) 100 MG tablet Take 100 mg by mouth every evening.      metoprolol tartrate (LOPRESSOR) 100 MG tablet Take 1 tablet (100 mg total) by mouth 2 (two) times daily. 180 tablet 3   OVER THE COUNTER MEDICATION Take 1 tablet by mouth 2 (two) times daily. Glucocil otc supplement     polyethylene glycol-electrolytes (TRILYTE) 420 g solution Take 4,000 mLs by mouth as directed. 4000 mL 0   Semaglutide (OZEMPIC, 0.25 OR 0.5 MG/DOSE, [place]) Inject 0.5 mg into the skin every Monday.      Vitamin D, Ergocalciferol, (DRISDOL) 50000 UNITS CAPS capsule Take 50,000 Units by mouth every Wednesday.     zinc gluconate 50 MG tablet Take 50 mg by mouth daily.     No current facility-administered medications for this visit.    ALLERGIES:  Allergies  Allergen Reactions   Ace Inhibitors Cough   Lisinopril Cough   Xarelto [Rivaroxaban] Other (See Comments)    Heart out of rhythm    Quinine Derivatives Rash    PHYSICAL EXAM:  Performance status (ECOG): 1 - Symptomatic but completely ambulatory  Vitals:   10/24/21 1530  BP: 127/76  Pulse: (!) 57  Resp: 18  Temp: 98 F (36.7 C)  SpO2: (!) 57%   Wt Readings from Last 3 Encounters:  10/24/21 293 lb 3.4 oz (133 kg)  07/18/21 (!) 304 lb (137.9 kg)  04/06/21 295 lb 4.8 oz (133.9 kg)   Physical Exam Vitals reviewed.  Constitutional:      Appearance: Normal appearance. He is obese.  Cardiovascular:     Rate and Rhythm: Normal rate and regular rhythm.     Pulses: Normal pulses.     Heart sounds:  Normal heart sounds.  Pulmonary:     Effort: Pulmonary effort is normal.     Breath sounds: Normal breath sounds.  Abdominal:     Palpations: Abdomen is soft. There is no hepatomegaly, splenomegaly or mass.     Tenderness: There is no abdominal tenderness.  Musculoskeletal:     Right lower leg: No edema.     Left lower leg: No edema.  Lymphadenopathy:     Lower Body: No right inguinal adenopathy. No left inguinal adenopathy.  Neurological:     General: No focal deficit present.     Mental Status: He is alert and oriented to person, place, and time.  Psychiatric:        Mood and Affect: Mood normal.        Behavior: Behavior normal.      LABORATORY DATA:  I have reviewed the labs as listed.     Latest Ref Rng & Units 10/17/2021    1:31 PM 07/11/2021    8:35 AM 03/30/2021   10:10 AM  CBC  WBC 4.0 - 10.5 K/uL 4.7  5.1  4.9   Hemoglobin 13.0 - 17.0 g/dL 14.3  13.8  14.1   Hematocrit 39.0 - 52.0 % 42.6  41.1  42.7   Platelets 150 - 400 K/uL 252  233  224       Latest Ref Rng & Units 10/17/2021    1:31 PM 07/11/2021    8:35 AM 03/30/2021   10:10 AM  CMP  Glucose 70 - 99 mg/dL 110  126  113   BUN 8 - 23 mg/dL 20  24  21    Creatinine 0.61 - 1.24 mg/dL 1.19  1.21  1.21   Sodium  135 - 145 mmol/L 140  138  140   Potassium 3.5 - 5.1 mmol/L 4.2  4.5  4.9   Chloride 98 - 111 mmol/L 105  106  101   CO2 22 - 32 mmol/L 25  25  30    Calcium 8.9 - 10.3 mg/dL 9.3  9.6  9.8   Total Protein 6.5 - 8.1 g/dL 7.7  7.6  8.4   Total Bilirubin 0.3 - 1.2 mg/dL 1.7  2.1  2.6   Alkaline Phos 38 - 126 U/L 111  102  103   AST 15 - 41 U/L 18  20  21    ALT 0 - 44 U/L 22  21  24      DIAGNOSTIC IMAGING:  I have independently reviewed the scans and discussed with the patient. CT Abdomen Pelvis W Contrast  Result Date: 10/19/2021 CLINICAL DATA:  Restaging colon cancer the ascending colon. Right hemicolectomy and left nephrectomy. Prior chemotherapy. History of hypertension and irritable bowel disease. *  Tracking Code: BO * EXAM: CT ABDOMEN AND PELVIS WITH CONTRAST TECHNIQUE: Multidetector CT imaging of the abdomen and pelvis was performed using the standard protocol following bolus administration of intravenous contrast. RADIATION DOSE REDUCTION: This exam was performed according to the departmental dose-optimization program which includes automated exposure control, adjustment of the mA and/or kV according to patient size and/or use of iterative reconstruction technique. CONTRAST:  113m OMNIPAQUE IOHEXOL 300 MG/ML  SOLN COMPARISON:  03/30/2021 FINDINGS: Lower chest: Mild mitral valve calcification. Hepatobiliary: Unremarkable Pancreas: Unremarkable Spleen: Unremarkable Adrenals/Urinary Tract: Adrenal glands unremarkable. Prior left nephrectomy. 2 mm right kidney lower pole nonobstructive renal calculus, image 51 series 2. Punctate 1-2 mm right mid kidney calculus on image 72 series 6. 0.8 by 0.6 by 0.7 cm hyperdense lesion in the wall of the dome of the right urinary bladder, image 83 series 2, stable from 08/26/2020. Stomach/Bowel: Right hemicolectomy.  Otherwise unremarkable. Vascular/Lymphatic: Atherosclerosis is present, including aortoiliac atherosclerotic disease. No pathologic adenopathy. Reproductive: Unremarkable Other: Faint stranding along the lower margin of the left perirenal space, unchanged. Musculoskeletal: Thoracolumbar spondylosis and lumbar degenerative disc disease with resulting lumbar impingement at L3-4, L4-5, and L5-S1. IMPRESSION: 1. Stable appearance of small hyperdense (possibly enhancing) lesion in the dome of the right urinary bladder. This appears to be in the wall of the bladder rather than extending into the lumen, and has not changed over the last year. Possibilities include a complex cyst in the bladder wall, benign neoplasm such as leiomyoma, neurofibroma, or solitary fibrous tumor; or a small malignant lesion. The lack of change over the last year's somewhat reassuring.  Surveillance is suggested and less this lesion has already been histologically characterized as benign. 2. No evidence of adenopathy, liver lesion, or other specific indicators of metastatic disease. 3. Nonobstructive right nephrolithiasis. 4. Diffuse hepatic steatosis. 5. Other imaging findings of potential clinical significance: Mitral valve calcification. Aortic Atherosclerosis (ICD10-I70.0). Right hemicolectomy. Left nephrectomy. Lower lumbar impingement. Electronically Signed   By: WVan ClinesM.D.   On: 10/19/2021 08:29     ASSESSMENT:  1.  Right-sided colon cancer: - He had 3 prior colonoscopies, last one 2 years ago by Dr. DWonda Chengin ETimnath with multiple polyps removed. - Colonoscopy by Dr. CJenetta Downeron 08/19/2020 showed fungating infiltrative ulcerated partially obstructing large mass in the proximal ascending colon.  Mass was partially circumferential.  7 sessile polyps found in the transverse colon, ascending colon and cecum. - Pathology consistent with adenocarcinoma of the biopsy of the mass.  Single fragment of at least intramucosal adenocarcinoma in the transverse colon polypectomy.  Pathology report has descending colon as adenocarcinoma. - CT CAP with contrast on 08/26/2020 shows circumferential lesion at the hepatic flexure of the colon consistent with primary colorectal carcinoma with no pathologically enlarged lymph node metastasis.  No liver metastasis.  Status post left nephrectomy.  No evidence of thoracic metastasis. - Even though descending colon polypectomy was reported as adenocarcinoma, Dr. Jenetta Downer felt that it was mislabeled.  Mass was reported in the proximal ascending colon on colonoscopy and close to hepatic flexure on CT scan. - Right hemicolectomy on 09/30/2020 - Pathology [id number], 2/30 lymph nodes positive, 1 tumor deposit, margins negative, MSI stable no lymphovascular or perineural invasion, grade 2, no perforation. -4 cycles of CAPEOX from 10/27/2020 through  12/29/2020.  2.  Left kidney renal cell carcinoma: - Incidentally found on work-up for kidney stones. - Left nephrectomy by Dr. Gilford Rile on 10/06/2019 with pathology showing clear-cell renal cell carcinoma, grade 2, 6 cm, margins negative.  1 benign lymph node.  PT1BPN0.  3.  Social/family history: - He is a disabled.  He worked at [employer] and a [employer].  He might have exposed to some chemicals at the later job.  No prior history of smoking. - Family history significant for mother with colon cancer, father with colon cancer and maternal grandfather with bone cancer.   PLAN:  1.  Stage III (PT3BPN1B right-sided colon cancer: - No change in bowel habits or bleeding per rectum. - CT [facility] on 10/17/2021 with no evidence of recurrence. - Stable appearance of dome of the right urinary bladder lesion.  I have recommended follow-up with his urologist. - Reviewed labs oh which showed normal LFTs.  Mildly elevated total bilirubin is stable.  CBC was normal.  CEA was 2.3. - RTC 3 months for follow-up with repeat labs including CEA.  2.  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  3.  Peripheral neuropathy: - Continue Cymbalta which is helping with the numbness in the fingertips and toes.   Orders placed this encounter:  Orders Placed This Encounter  Procedures   CBC with Differential/Platelet   Comprehensive metabolic panel   CEA     Derek Jack, MD [place] [phone]

## 2021-11-15 ENCOUNTER — Ambulatory Visit (HOSPITAL_COMMUNITY): Payer: Medicare HMO | Admitting: Anesthesiology

## 2021-11-15 ENCOUNTER — Encounter (HOSPITAL_COMMUNITY): Payer: Self-pay | Admitting: Gastroenterology

## 2021-11-15 ENCOUNTER — Ambulatory Visit (HOSPITAL_BASED_OUTPATIENT_CLINIC_OR_DEPARTMENT_OTHER): Payer: Medicare HMO | Admitting: Anesthesiology

## 2021-11-15 ENCOUNTER — Ambulatory Visit (HOSPITAL_COMMUNITY)
Admission: RE | Admit: 2021-11-15 | Discharge: 2021-11-15 | Disposition: A | Discharge status: Home / Self Care | Payer: Medicare HMO | Attending: Gastroenterology | Admitting: Gastroenterology

## 2021-11-15 ENCOUNTER — Other Ambulatory Visit: Payer: Self-pay

## 2021-11-15 ENCOUNTER — Encounter (HOSPITAL_COMMUNITY)
Admission: RE | Disposition: A | Discharge status: Home / Self Care | Payer: Self-pay | Source: Home / Self Care | Attending: Gastroenterology

## 2021-11-15 DIAGNOSIS — K219 Gastro-esophageal reflux disease without esophagitis: Secondary | ICD-10-CM | POA: Insufficient documentation

## 2021-11-15 DIAGNOSIS — Z08 Encounter for follow-up examination after completed treatment for malignant neoplasm: Secondary | ICD-10-CM

## 2021-11-15 DIAGNOSIS — E119 Type 2 diabetes mellitus without complications: Secondary | ICD-10-CM | POA: Diagnosis not present

## 2021-11-15 DIAGNOSIS — Z86010 Personal history of colonic polyps: Secondary | ICD-10-CM

## 2021-11-15 DIAGNOSIS — K589 Irritable bowel syndrome without diarrhea: Secondary | ICD-10-CM | POA: Diagnosis not present

## 2021-11-15 DIAGNOSIS — E785 Hyperlipidemia, unspecified: Secondary | ICD-10-CM | POA: Insufficient documentation

## 2021-11-15 DIAGNOSIS — Z1211 Encounter for screening for malignant neoplasm of colon: Secondary | ICD-10-CM | POA: Diagnosis not present

## 2021-11-15 DIAGNOSIS — I4891 Unspecified atrial fibrillation: Secondary | ICD-10-CM | POA: Insufficient documentation

## 2021-11-15 DIAGNOSIS — K5730 Diverticulosis of large intestine without perforation or abscess without bleeding: Secondary | ICD-10-CM

## 2021-11-15 DIAGNOSIS — Z85038 Personal history of other malignant neoplasm of large intestine: Secondary | ICD-10-CM | POA: Diagnosis not present

## 2021-11-15 DIAGNOSIS — K621 Rectal polyp: Secondary | ICD-10-CM | POA: Diagnosis not present

## 2021-11-15 DIAGNOSIS — N189 Chronic kidney disease, unspecified: Secondary | ICD-10-CM | POA: Insufficient documentation

## 2021-11-15 DIAGNOSIS — D128 Benign neoplasm of rectum: Secondary | ICD-10-CM

## 2021-11-15 DIAGNOSIS — I129 Hypertensive chronic kidney disease with stage 1 through stage 4 chronic kidney disease, or unspecified chronic kidney disease: Secondary | ICD-10-CM | POA: Diagnosis not present

## 2021-11-15 DIAGNOSIS — Z980 Intestinal bypass and anastomosis status: Secondary | ICD-10-CM | POA: Insufficient documentation

## 2021-11-15 DIAGNOSIS — E1122 Type 2 diabetes mellitus with diabetic chronic kidney disease: Secondary | ICD-10-CM | POA: Insufficient documentation

## 2021-11-15 DIAGNOSIS — K648 Other hemorrhoids: Secondary | ICD-10-CM | POA: Insufficient documentation

## 2021-11-15 HISTORY — PX: POLYPECTOMY: SHX5525

## 2021-11-15 HISTORY — PX: COLONOSCOPY WITH PROPOFOL: SHX5780

## 2021-11-15 LAB — GLUCOSE, CAPILLARY: Glucose-Capillary: 101 mg/dL — ABNORMAL HIGH (ref 70–99)

## 2021-11-15 LAB — HM COLONOSCOPY

## 2021-11-15 SURGERY — COLONOSCOPY WITH PROPOFOL
Anesthesia: General

## 2021-11-15 MED ORDER — PROPOFOL 10 MG/ML IV BOLUS
INTRAVENOUS | Status: DC | PRN
  Administered 2021-11-15: 30 mg via INTRAVENOUS
  Administered 2021-11-15: 20 mg via INTRAVENOUS
  Administered 2021-11-15: 100 mg via INTRAVENOUS

## 2021-11-15 MED ORDER — LIDOCAINE HCL 1 % IJ SOLN
INTRAMUSCULAR | Status: DC | PRN
  Administered 2021-11-15: 50 mg via INTRADERMAL

## 2021-11-15 MED ORDER — LACTATED RINGERS IV SOLN: INTRAVENOUS | Status: DC

## 2021-11-15 MED ORDER — PROPOFOL 500 MG/50ML IV EMUL
INTRAVENOUS | Status: DC | PRN
  Administered 2021-11-15: 150 ug/kg/min via INTRAVENOUS

## 2021-11-15 MED ORDER — LACTATED RINGERS IV SOLN: INTRAVENOUS | Status: DC | PRN

## 2021-11-15 NOTE — Transfer of Care (Signed)
Immediate Anesthesia Transfer of Care Note  Patient: Bradley Rodriguez  Procedure(s) Performed: COLONOSCOPY WITH PROPOFOL POLYPECTOMY  Patient Location: Short Stay  Anesthesia Type:General  Level of Consciousness: awake  Airway & Oxygen Therapy: Patient Spontanous Breathing  Post-op Assessment: Report given to RN  Post vital signs: Reviewed and stable  Last Vitals:  Vitals Value Taken Time  BP 109/62 11/15/21 1430  Temp 36.7 C 11/15/21 1430  Pulse 61 11/15/21 1430  Resp 16 11/15/21 1430  SpO2 96 % 11/15/21 1430    Last Pain:  Vitals:   11/15/21 1430  TempSrc: Oral  PainSc: 0-No pain      Patients Stated Pain Goal: 9 (92/17/83 7542)  Complications: No notable events documented.

## 2021-11-15 NOTE — H&P (Signed)
Bradley Rodriguez is an 61 y.o. male.   Chief Complaint: history colon cancer HPI: 61 year old male with past medical history of colon cancer status postresection on 10/05/2020, afib,DM, RCC s/p resection, CKD, HLD,  HTN, IBS, who follow-up of colon cancer.  The patient denies having any nausea, vomiting, fever, chills, hematochezia, melena, hematemesis, abdominal distention, abdominal pain, diarrhea, jaundice, pruritus or weight loss.  Last CEA was 2.3 4 weeks ago.  CT of the abdomen and pelvis with IV contrast in September without presence of metastatic disease or colonic abnormalities.  Past Medical History:  Diagnosis Date   A-fib [facility])    DCCV 2009, 2012, 2014, Nov 2016   Anxiety    Arthritis    Knees, wrist, ankles   Cancer ([place]) 07/2019   Kidney   Colon cancer ([place])    Diabetes mellitus without complication ([place])    Dysrhythmia 1996   A- Fib   GERD (gastroesophageal reflux disease)    History of kidney stones    HX: anticoagulation    very remotely and briefly on COumadin aound one of his CV, 2014 on Eliquis had hematuria with renal calculi and stopped   Hyperlipidemia    patient denies this   Hypertension    IBS (irritable bowel syndrome)    Normal cardiac stress test    Normal nuclear stress test , 2001    Past Surgical History:  Procedure Laterality Date   BIOPSY  08/19/2020   Procedure: BIOPSY;  Surgeon: Harvel Quale, MD;  [facility];  Service: Gastroenterology;;   CARDIOVERSION N/A 10/15/2012   Procedure: CARDIOVERSION;  Surgeon: Larey Dresser, MD;  Location: [facility] ENDOSCOPY;  Service: Cardiovascular;  Laterality: N/A;   CARDIOVERSION N/A 11/26/2014   Procedure: CARDIOVERSION;  Surgeon: Arnoldo Lenis, MD;  Location: [facility] OR[facility];  Service: Endoscopy;  Laterality: N/A;   CARDIOVERSION N/A 11/15/2015   Procedure: CARDIOVERSION;  Surgeon: Jerline Pain, MD;  Location: [place];  Service: Cardiovascular;  Laterality: N/A;   CARDIOVERSION N/A  08/07/2016   Procedure: CARDIOVERSION;  Surgeon: Herminio Commons, MD;  Location: [facility];  Service: Cardiovascular;  Laterality: N/A;   CARDIOVERSION N/A 09/11/2018   Procedure: CARDIOVERSION;  Surgeon: Arnoldo Lenis, MD;  [facility]: [facility];  Service: Endoscopy;  Laterality: N/A;   CARDIOVERSION N/A 01/03/2021   Procedure: CARDIOVERSION;  Surgeon: Berniece Salines, DO;  Location: [place];  Service: Cardiovascular;  Laterality: N/A;   CATARACT EXTRACTION W/PHACO Right 01/18/2014   Procedure: CATARACT EXTRACTION PHACO AND INTRAOCULAR LENS PLACEMENT (IOC);  Surgeon: Tonny Branch, MD;  Location: [facility];  Service: Ophthalmology;  Laterality: Right;  CDE 9.95   COLONOSCOPY WITH PROPOFOL N/A 08/19/2020   Procedure: COLONOSCOPY WITH PROPOFOL;  Surgeon: Harvel Quale, MD;  [facility];  Service: Gastroenterology;  Laterality: N/A;  8:20   CYSTOSCOPY WITH LITHOLAPAXY N/A 10/06/2019   Procedure: CYSTOSCOPY WITH LASER LITHOLAPAXY;  Surgeon: Ceasar Mons, MD;  [facility];  Service: Urology;  Laterality: N/A;   ESOPHAGOGASTRODUODENOSCOPY (EGD) WITH PROPOFOL N/A 08/19/2020   Procedure: ESOPHAGOGASTRODUODENOSCOPY (EGD) WITH PROPOFOL;  Surgeon: Harvel Quale, MD;  [facility];  Service: Gastroenterology;  Laterality: N/A;   EYE SURGERY Right    detached retina   FOOT SURGERY Left    KNEE ARTHROSCOPY Right    LAPAROSCOPIC RIGHT HEMI COLECTOMY N/A 09/30/2020   Procedure: LAPAROSCOPIC RIGHT HEMI COLECTOMY;  Surgeon: Ileana Roup, MD;  [facility];  Service: General;  Laterality: N/A;  LITHOTRIPSY     for kidney stones 2002   POLYPECTOMY  08/19/2020   Procedure: POLYPECTOMY INTESTINAL;  Surgeon: Harvel Quale, MD;  [facility];  Service: Gastroenterology;;   POLYPECTOMY  08/19/2020   Procedure: POLYPECTOMY;  Surgeon: Harvel Quale, MD;  [facility];  Service: Gastroenterology;;   ROBOT  ASSISTED LAPAROSCOPIC NEPHRECTOMY Left 10/06/2019   Procedure: XI ROBOTIC ASSISTED LAPAROSCOPIC NEPHRECTOMY WITH ADRENALECTOMY;  Surgeon: Ceasar Mons, MD;  [facility];  Service: Urology;  Laterality: Left;   SUBMUCOSAL TATTOO INJECTION  08/19/2020   Procedure: SUBMUCOSAL TATTOO INJECTION;  Surgeon: Harvel Quale, MD;  [facility];  Service: Gastroenterology;;   TEE WITHOUT CARDIOVERSION N/A 11/26/2014   Procedure: TRANSESOPHAGEAL ECHOCARDIOGRAM (TEE) WITH PROPOFOL;  Surgeon: Arnoldo Lenis, MD;  [facility];  Service: Endoscopy;  Laterality: N/A;   TEE WITHOUT CARDIOVERSION N/A 11/15/2015   Procedure: TRANSESOPHAGEAL ECHOCARDIOGRAM (TEE);  Surgeon: Jerline Pain, MD;  Location: [place];  Service: Cardiovascular;  Laterality: N/A;   TEE WITHOUT CARDIOVERSION N/A 09/11/2018   Procedure: TRANSESOPHAGEAL ECHOCARDIOGRAM (TEE) WITH PROPOFOL;  Surgeon: Arnoldo Lenis, MD;  [facility];  Service: Endoscopy;  Laterality: N/A;   TEE WITHOUT CARDIOVERSION N/A 01/03/2021   Procedure: TRANSESOPHAGEAL ECHOCARDIOGRAM (TEE);  Surgeon: Berniece Salines, DO;  [facility];  Service: Cardiovascular;  Laterality: N/A;   VASECTOMY      Family History  Problem Relation Age of Onset   Diabetes Mother    Hypertension Mother    Colon cancer Mother        dx. 20s   Diabetes Father    Colon cancer Father        dx. 37s   Lung cancer Maternal Grandmother        smoked   Bone cancer Maternal Grandfather        dx. 80s   Hypertension Other    Social History:  reports that he has never smoked. He has never used smokeless tobacco. He reports that he does not drink alcohol and does not use drugs.  Allergies:  Allergies  Allergen Reactions   Ace Inhibitors Cough   Lisinopril Cough   Xarelto [Rivaroxaban] Other (See Comments)    Heart out of rhythm    Quinine Derivatives Rash    Medications Prior to Admission  Medication Sig Dispense Refill    amLODipine (NORVASC) 5 MG tablet TAKE ONE TABLET BY MOUTH EVERY DAY 90 tablet 3   atorvastatin (LIPITOR) 20 MG tablet Take 20 mg by mouth every evening.      Barberry-Oreg Grape-Goldenseal (BERBERINE COMPLEX PO) Take 1 capsule by mouth daily.     cholecalciferol (VITAMIN D3) 25 MCG (1000 UNIT) tablet Take 1,000 Units by mouth daily.     cyanocobalamin (VITAMIN B12) 1000 MCG tablet Take 1,000 mcg by mouth daily.     DULoxetine (CYMBALTA) 30 MG capsule Take 30 mg by mouth daily.     losartan (COZAAR) 100 MG tablet Take 100 mg by mouth every evening.      metoprolol tartrate (LOPRESSOR) 100 MG tablet Take 1 tablet (100 mg total) by mouth 2 (two) times daily. 180 tablet 3   OVER THE COUNTER MEDICATION Take 1 tablet by mouth 2 (two) times daily. Glucocil otc supplement     polyethylene glycol-electrolytes (TRILYTE) 420 g solution Take 4,000 mLs by mouth as directed. 4000 mL 0   Semaglutide (OZEMPIC, 0.25 OR 0.5 MG/DOSE, [place]) Inject 0.5 mg into the skin  every Monday.      Zinc 30 MG TABS Take 30 mg by mouth daily.     iron polysaccharides (NIFEREX) 150 MG capsule Take 150 mg by mouth 2 (two) times daily.      No results found for this or any previous visit (from the past 48 hour(s)). No results found.  Review of Systems  All other systems reviewed and are negative.   Blood pressure 134/87, pulse (!) 57, temperature 98 F (36.7 C), temperature source Oral, resp. rate 10, height '6\' 8"'$  (2.032 m), weight 128.8 kg, SpO2 97 %. Physical Exam  GENERAL: The patient is AO x3, in no acute distress. HEENT: Head is normocephalic and atraumatic. EOMI are intact. Mouth is well hydrated and without lesions. NECK: Supple. No masses LUNGS: Clear to auscultation. No presence of rhonchi/wheezing/rales. Adequate chest expansion HEART: RRR, normal s1 and s2. ABDOMEN: Soft, nontender, no guarding, no peritoneal signs, and nondistended. BS +. No masses. EXTREMITIES: Without any cyanosis, clubbing, rash, lesions  or edema. NEUROLOGIC: AOx3, no focal motor deficit. SKIN: no jaundice, no rashes  Assessment/Plan 61 year old male with past medical history of colon cancer status postresection on 10/05/2020, afib,DM, RCC s/p resection, CKD, HLD,  HTN, IBS, who follow-up of colon cancer.  We will proceed with colonoscopy. Harvel Quale, MD 11/15/2021, 12:35 PM

## 2021-11-15 NOTE — Discharge Instructions (Signed)
You are being discharged to home.  Resume your previous diet.  We are waiting for your pathology results.  Your physician has recommended a repeat colonoscopy in three years for surveillance.  

## 2021-11-15 NOTE — Anesthesia Postprocedure Evaluation (Signed)
Anesthesia Post Note  Patient: Bradley Rodriguez  Procedure(s) Performed: COLONOSCOPY WITH PROPOFOL POLYPECTOMY  Patient location during evaluation: Endoscopy Anesthesia Type: General Level of consciousness: awake and alert Pain management: pain level controlled Vital Signs Assessment: post-procedure vital signs reviewed and stable Respiratory status: spontaneous breathing Cardiovascular status: blood pressure returned to baseline and stable Postop Assessment: no apparent nausea or vomiting Anesthetic complications: no   No notable events documented.   Last Vitals:  Vitals:   11/15/21 1227 11/15/21 1430  BP: 134/87 109/62  Pulse: (!) 57 61  Resp: 10 16  Temp: 36.7 C 36.7 C  SpO2: 97% 96%    Last Pain:  Vitals:   11/15/21 1430  TempSrc: Oral  PainSc: 0-No pain                 Armie Moren

## 2021-11-15 NOTE — Anesthesia Preprocedure Evaluation (Signed)
Anesthesia Evaluation  Patient identified by MRN, date of birth, ID band Patient awake    Reviewed: Allergy & Precautions, H&P , NPO status , Patient's Chart, lab work & pertinent test results, reviewed documented beta blocker date and time   Airway Mallampati: II  TM Distance: >3 FB Neck ROM: full    Dental no notable dental hx.    Pulmonary neg pulmonary ROS,    Pulmonary exam normal breath sounds clear to auscultation       Cardiovascular Exercise Tolerance: Good hypertension, negative cardio ROS   Rhythm:irregular Rate:Normal     Neuro/Psych PSYCHIATRIC DISORDERS Anxiety negative neurological ROS     GI/Hepatic Neg liver ROS, GERD  Medicated,  Endo/Other  negative endocrine ROSdiabetes, Type 2  Renal/GU Renal disease  negative genitourinary   Musculoskeletal   Abdominal   Peds  Hematology  (+) Blood dyscrasia, anemia ,   Anesthesia Other Findings   Reproductive/Obstetrics negative OB ROS                             Anesthesia Physical Anesthesia Plan  ASA: 3  Anesthesia Plan: General   Post-op Pain Management:    Induction:   PONV Risk Score and Plan: Propofol infusion  Airway Management Planned:   Additional Equipment:   Intra-op Plan:   Post-operative Plan:   Informed Consent: I have reviewed the patients History and Physical, chart, labs and discussed the procedure including the risks, benefits and alternatives for the proposed anesthesia with the patient or authorized representative who has indicated his/her understanding and acceptance.     Dental Advisory Given  Plan Discussed with: CRNA  Anesthesia Plan Comments:         Anesthesia Quick Evaluation

## 2021-11-15 NOTE — Op Note (Signed)
[facility] Patient Name: Bradley Rodriguez Procedure Date: 11/15/2021 1:47 PM MRN: [id number] Date of Birth: May 24, 1960 Attending MD: Maylon Peppers , , [id number] CSN: [id number] Age: 61 Admit Type: Outpatient Procedure:                Colonoscopy Indications:              High risk colon cancer surveillance: Personal                            history of colon cancer Providers:                Maylon Peppers, Charlsie Quest. Theda Sers RN, RN, Ladoris Gene Technician, [employer] Referring MD:              Medicines:                Monitored Anesthesia Care Complications:            No immediate complications. Estimated Blood Loss:     Estimated blood loss: none. Procedure:                Pre-Anesthesia Assessment:                           - Prior to the procedure, a History and Physical                            was performed, and patient medications, allergies                            and sensitivities were reviewed. The patient's                            tolerance of previous anesthesia was reviewed.                           - The risks and benefits of the procedure and the                            sedation options and risks were discussed with the                            patient. All questions were answered and informed                            consent was obtained.                           - ASA Grade Assessment: II - A patient with mild                            systemic disease.                           After obtaining informed consent, the colonoscope  was passed under direct vision. Throughout the                            procedure, the patient's blood pressure, pulse, and                            oxygen saturations were monitored continuously. The                            [id number]-HQ190L ([id number]) scope was introduced through                            the anus and advanced to the the terminal ileum.                             The colonoscopy was performed without difficulty.                            The patient tolerated the procedure well. The                            quality of the bowel preparation was adequate. Scope In: 2:01:30 PM Scope Out: 2:23:12 PM Scope Withdrawal Time: 0 hours 18 minutes 5 seconds  Total Procedure Duration: 0 hours 21 minutes 42 seconds  Findings:      The perianal and digital rectal examinations were normal.      The terminal ileum appeared normal.      There was evidence of a prior end-to-side ileo-colonic anastomosis in       the proximal ascending colon. This was patent and was characterized by       healthy appearing mucosa. The anastomosis was traversed.      Scattered medium-mouthed and small-mouthed diverticula were found in the       descending colon and transverse colon.      A 2 mm polyp was found in the rectum. The polyp was sessile. The polyp       was removed with a cold snare. Resection and retrieval were complete.      Non-bleeding internal hemorrhoids were found during retroflexion. The       hemorrhoids were small. Impression:               - The examined portion of the ileum was normal.                           - Patent end-to-side ileo-colonic anastomosis,                            characterized by healthy appearing mucosa.                           - Diverticulosis in the descending colon and in the                            transverse colon.                           -  One 2 mm polyp in the rectum, removed with a cold                            snare. Resected and retrieved.                           - Non-bleeding internal hemorrhoids. Moderate Sedation:      Per Anesthesia Care Recommendation:           - Discharge patient to home (ambulatory).                           - Resume previous diet.                           - Await pathology results.                           - Repeat colonoscopy in 3 years for  surveillance. Procedure Code(s):        --- Professional ---                           [phone], Colonoscopy, flexible; with removal of                            tumor(s), polyp(s), or other lesion(s) by snare                            technique Diagnosis Code(s):        --- Professional ---                           C62.376, Personal history of other malignant                            neoplasm of large intestine                           K64.8, Other hemorrhoids                           Z98.0, Intestinal bypass and anastomosis status                           D12.8, Benign neoplasm of rectum                           K57.30, Diverticulosis of large intestine without                            perforation or abscess without bleeding CPT copyright 2022 American Medical Association. All rights reserved. The codes documented in this report are preliminary and upon coder review may  be revised to meet current compliance requirements. Maylon Peppers, MD Maylon Peppers,  11/15/2021 2:40:46 PM This report has been signed electronically. Number of Addenda: 0

## 2021-11-16 ENCOUNTER — Encounter (INDEPENDENT_AMBULATORY_CARE_PROVIDER_SITE_OTHER): Payer: Self-pay | Admitting: *Deleted

## 2021-11-17 LAB — SURGICAL PATHOLOGY

## 2021-11-20 ENCOUNTER — Encounter (HOSPITAL_COMMUNITY): Payer: Self-pay | Admitting: Gastroenterology

## 2021-12-09 ENCOUNTER — Other Ambulatory Visit (HOSPITAL_COMMUNITY): Payer: Self-pay | Admitting: Nurse Practitioner

## 2022-01-17 ENCOUNTER — Other Ambulatory Visit: Payer: Self-pay | Admitting: Cardiology

## 2022-01-19 ENCOUNTER — Inpatient Hospital Stay: Payer: Medicare HMO | Attending: Hematology

## 2022-01-29 ENCOUNTER — Ambulatory Visit: Payer: Medicare HMO | Admitting: Hematology

## 2022-02-14 DIAGNOSIS — E1165 Type 2 diabetes mellitus with hyperglycemia: Secondary | ICD-10-CM | POA: Diagnosis not present

## 2022-02-14 DIAGNOSIS — Z1329 Encounter for screening for other suspected endocrine disorder: Secondary | ICD-10-CM | POA: Diagnosis not present

## 2022-02-14 DIAGNOSIS — E7849 Other hyperlipidemia: Secondary | ICD-10-CM | POA: Diagnosis not present

## 2022-02-14 DIAGNOSIS — Z125 Encounter for screening for malignant neoplasm of prostate: Secondary | ICD-10-CM | POA: Diagnosis not present

## 2022-02-14 DIAGNOSIS — R5383 Other fatigue: Secondary | ICD-10-CM | POA: Diagnosis not present

## 2022-02-14 DIAGNOSIS — I10 Essential (primary) hypertension: Secondary | ICD-10-CM | POA: Diagnosis not present

## 2022-02-15 ENCOUNTER — Other Ambulatory Visit: Payer: Self-pay | Admitting: Cardiology

## 2022-02-21 DIAGNOSIS — E7849 Other hyperlipidemia: Secondary | ICD-10-CM | POA: Diagnosis not present

## 2022-02-21 DIAGNOSIS — E1142 Type 2 diabetes mellitus with diabetic polyneuropathy: Secondary | ICD-10-CM | POA: Diagnosis not present

## 2022-02-21 DIAGNOSIS — I480 Paroxysmal atrial fibrillation: Secondary | ICD-10-CM | POA: Diagnosis not present

## 2022-02-21 DIAGNOSIS — M1000 Idiopathic gout, unspecified site: Secondary | ICD-10-CM | POA: Diagnosis not present

## 2022-02-21 DIAGNOSIS — Z0001 Encounter for general adult medical examination with abnormal findings: Secondary | ICD-10-CM | POA: Diagnosis not present

## 2022-02-21 DIAGNOSIS — I10 Essential (primary) hypertension: Secondary | ICD-10-CM | POA: Diagnosis not present

## 2022-02-21 DIAGNOSIS — E8881 Metabolic syndrome: Secondary | ICD-10-CM | POA: Diagnosis not present

## 2022-02-21 DIAGNOSIS — D500 Iron deficiency anemia secondary to blood loss (chronic): Secondary | ICD-10-CM | POA: Diagnosis not present

## 2022-02-21 DIAGNOSIS — K580 Irritable bowel syndrome with diarrhea: Secondary | ICD-10-CM | POA: Diagnosis not present

## 2022-02-28 DIAGNOSIS — E875 Hyperkalemia: Secondary | ICD-10-CM | POA: Diagnosis not present

## 2022-03-01 ENCOUNTER — Encounter (HOSPITAL_COMMUNITY): Payer: Self-pay | Admitting: *Deleted

## 2022-03-07 DIAGNOSIS — I10 Essential (primary) hypertension: Secondary | ICD-10-CM | POA: Diagnosis not present

## 2022-03-07 DIAGNOSIS — E1165 Type 2 diabetes mellitus with hyperglycemia: Secondary | ICD-10-CM | POA: Diagnosis not present

## 2022-03-07 DIAGNOSIS — E875 Hyperkalemia: Secondary | ICD-10-CM | POA: Diagnosis not present

## 2022-03-07 DIAGNOSIS — R5383 Other fatigue: Secondary | ICD-10-CM | POA: Diagnosis not present

## 2022-03-15 ENCOUNTER — Encounter: Payer: Self-pay | Admitting: Cardiology

## 2022-04-20 ENCOUNTER — Other Ambulatory Visit: Payer: Self-pay | Admitting: Cardiology

## 2022-05-12 IMAGING — CT CT CHEST-ABD-PELV W/ CM
2 of 5 series · 15 of 46 positions shown, 17 images · IV contrast (APPLIED)
Comparison: CT 01/26/2020

CLINICAL DATA: Colorectal carcinoma. Additional history of LEFT
renal cell carcinoma post nephrectomy.

EXAM:
CT CHEST, ABDOMEN, AND PELVIS WITH CONTRAST
TECHNIQUE: Multidetector CT imaging of the chest, abdomen and pelvis was
performed following the standard protocol during bolus
administration of intravenous contrast.
CONTRAST:  100mL OMNIPAQUE IOHEXOL 300 MG/ML  SOLN

[Series 2: cap with · axial · 0.98mm/px · z∈[+755,+1400]mm · 12 of 153 slices shown, 14 images]
[im 12/153  soft-tissue]
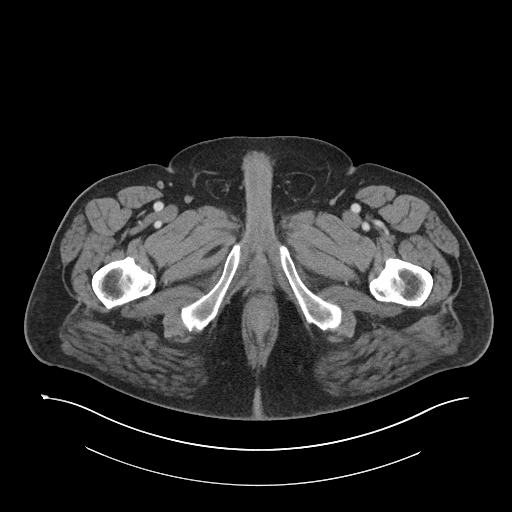
[im 12/153  bone]
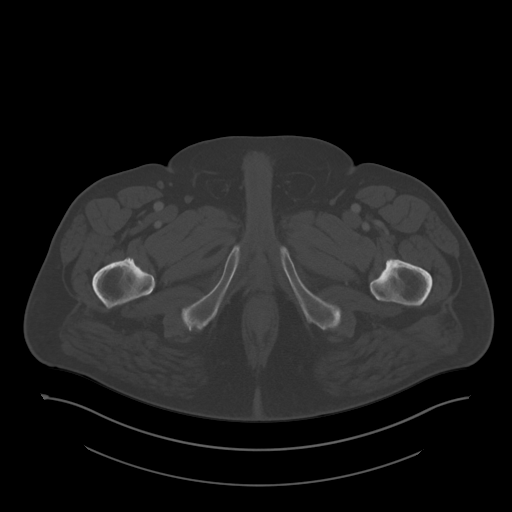
[im 24/153  soft-tissue]
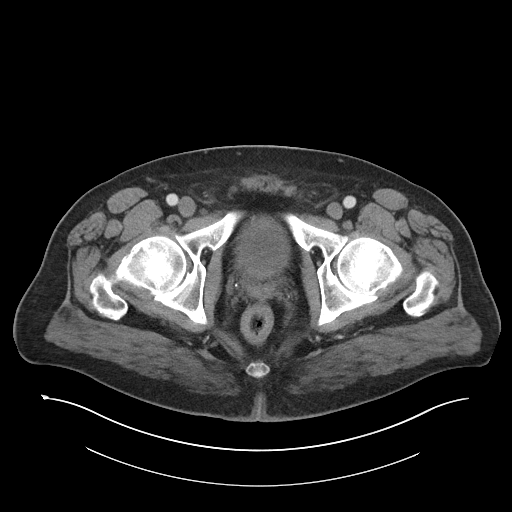
[im 36/153  soft-tissue]
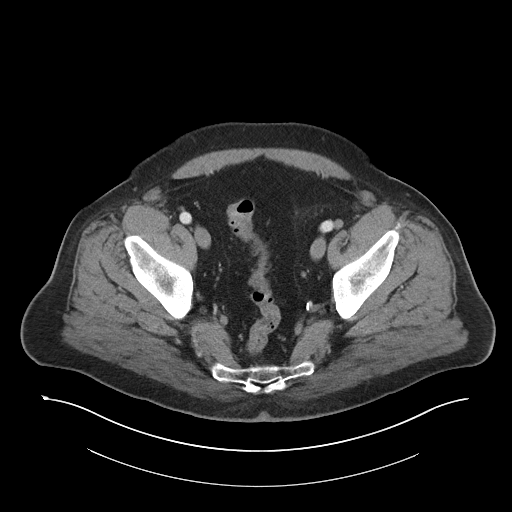
[im 47/153  soft-tissue]
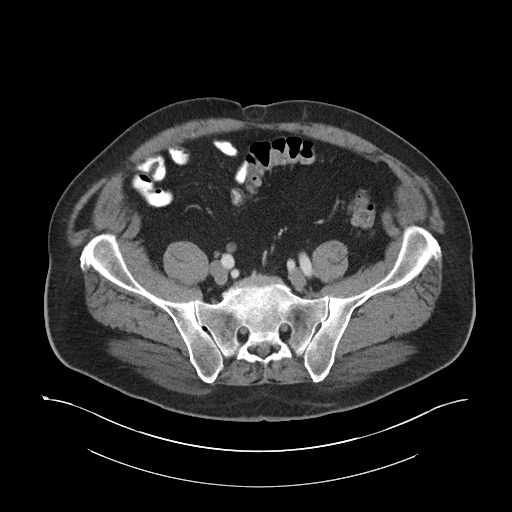
[im 59/153  soft-tissue]
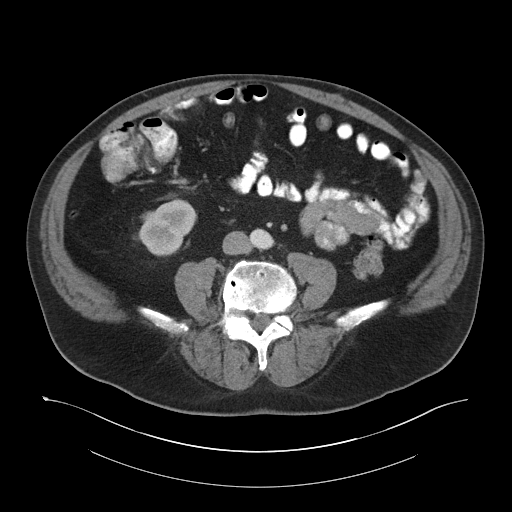
[im 71/153  soft-tissue]
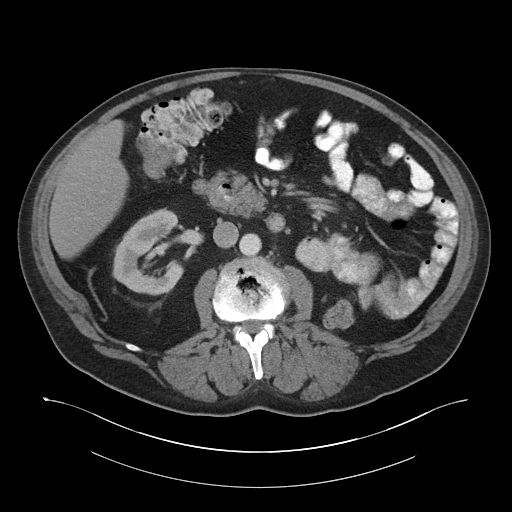
[im 82/153  soft-tissue]
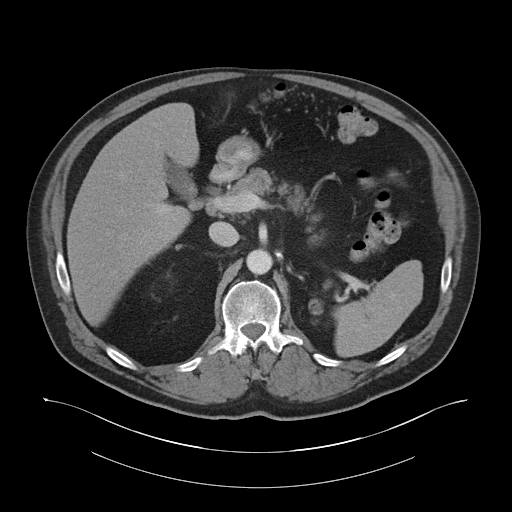
[im 94/153  soft-tissue]
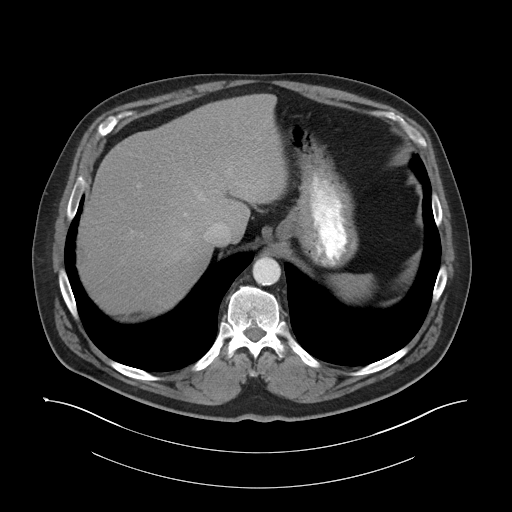
[im 106/153  soft-tissue]
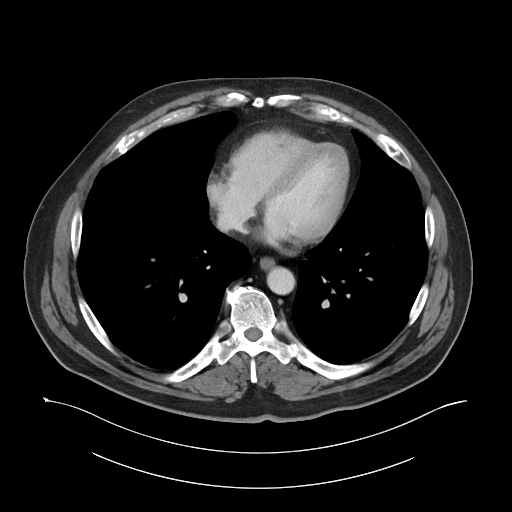
[im 106/153  bone]
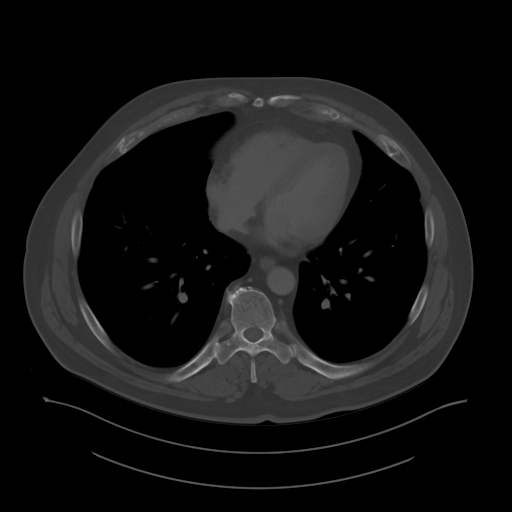
[im 117/153  soft-tissue]
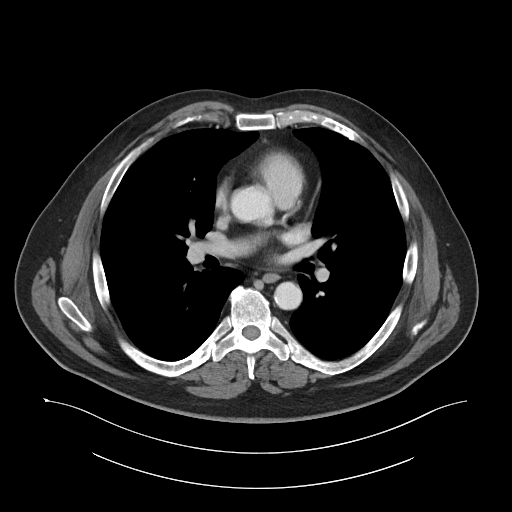
[im 129/153  soft-tissue]
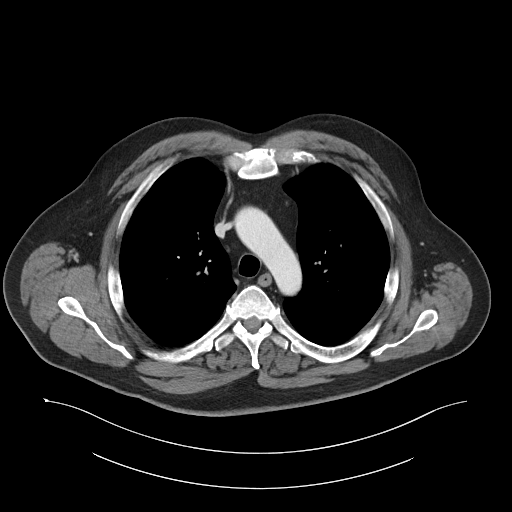
[im 141/153  soft-tissue]
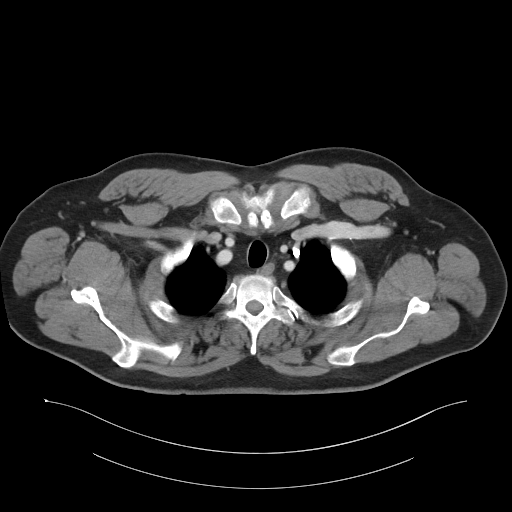

[Series 5: coronal · coronal · 0.97mm/px · 3 of 123 slices shown]
[im 41/123  soft-tissue]
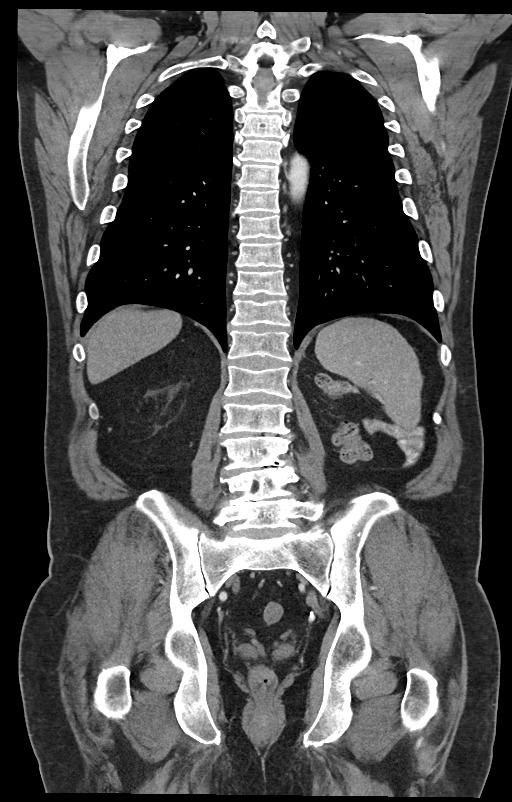
[im 55/123  soft-tissue]
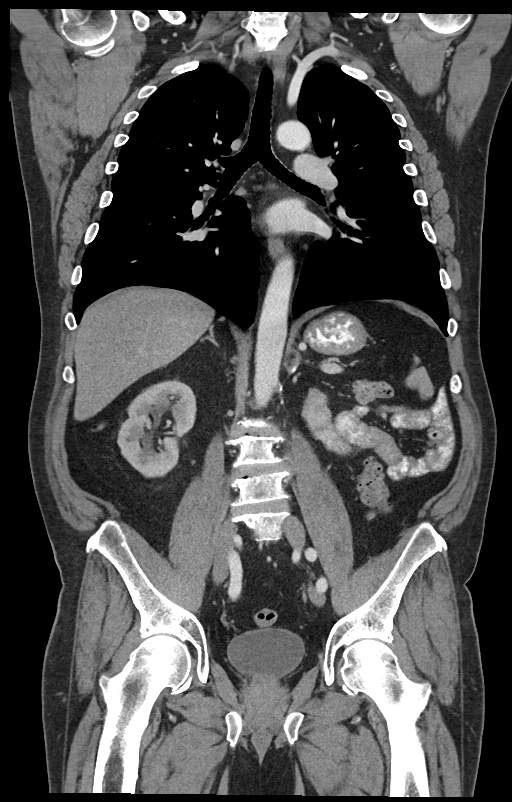
[im 68/123  soft-tissue]
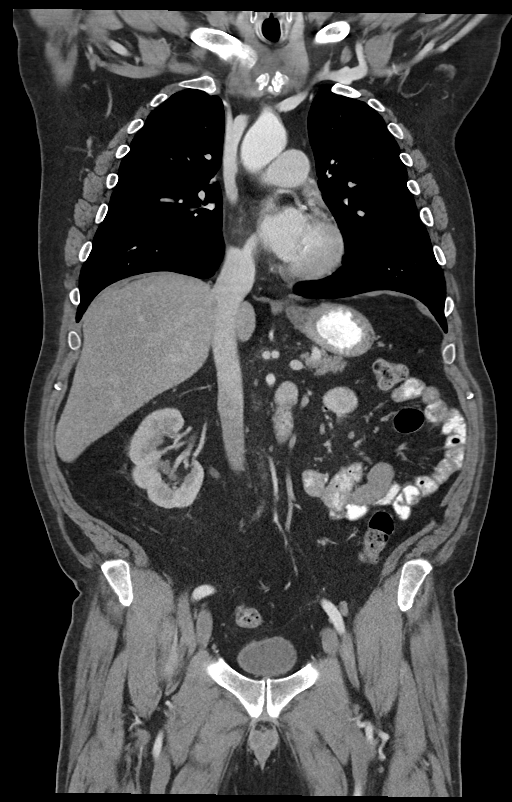

[15 of 46 positions shown; findings below may reference images not displayed]

FINDINGS: CT CHEST FINDINGS

Cardiovascular: Coronary artery calcification and aortic
atherosclerotic calcification.

Mediastinum/Nodes: No axillary or supraclavicular adenopathy. No
mediastinal or hilar adenopathy. No pericardial fluid. Esophagus
normal.

Lungs/Pleura: No suspicious pulmonary nodules.

Musculoskeletal: No aggressive osseous lesion.

CT ABDOMEN AND PELVIS FINDINGS

Hepatobiliary: No focal hepatic lesion. No biliary ductal
dilatation. Gallbladder is normal. Common bile duct is normal.

Pancreas: Pancreas is normal. No ductal dilatation. No pancreatic
inflammation.

Spleen: Normal spleen

Adrenals/urinary tract: RIGHT adrenal gland and kidney are normal.
RIGHT ureter and bladder normal. Post LEFT nephrectomy. No
nodularity in nephrectomy bed.

Stomach/Bowel: Stomach, duodenum small-bowel normal. Terminal ileum
and appendix normal.

There is a circumferential lesion in the RIGHT colon at the level of
the hepatic flexure (image 86/2). Several small lymph nodes in the
adjacent mesocolon are not pathologic by size criteria. Periportal
lymph nodes are within normal size limits. For example 12 mm node on
image 75/2).

Vascular/Lymphatic: Abdominal aorta is normal caliber with
atherosclerotic calcification. There is no retroperitoneal or
periportal lymphadenopathy. No pelvic lymphadenopathy.

Reproductive: Prostate unremarkable

Other: No peritoneal or omental lesions.

Musculoskeletal: No aggressive osseous lesion.
IMPRESSION: Chest: Chest Impression:

1. No evidence of thoracic metastasis.
2. Coronary artery calcification and Aortic Atherosclerosis
([id number]).

Abdomen / Pelvis Impression:

1. Circumferential lesion at the hepatic flexure of the colon
consistent with primary colorectal carcinoma.
2. No pathologically enlarged lymph node metastasis identified.
3. No liver metastasis.
4. Post LEFT nephrectomy without evidence local recurrence.

## 2022-05-25 ENCOUNTER — Other Ambulatory Visit: Payer: Self-pay | Admitting: Cardiology

## 2022-05-25 NOTE — Telephone Encounter (Signed)
Electronic refill request came in for Warfarin.   Will forward to anticoag.

## 2022-05-25 NOTE — Telephone Encounter (Signed)
Per Anti-Coag visit on 4/26, Warfarin has been discontinued by GI/PCP.

## 2022-06-08 ENCOUNTER — Other Ambulatory Visit: Payer: Self-pay | Admitting: Cardiology

## 2022-06-19 DIAGNOSIS — M1000 Idiopathic gout, unspecified site: Secondary | ICD-10-CM | POA: Diagnosis not present

## 2022-06-19 DIAGNOSIS — E782 Mixed hyperlipidemia: Secondary | ICD-10-CM | POA: Diagnosis not present

## 2022-06-19 DIAGNOSIS — E875 Hyperkalemia: Secondary | ICD-10-CM | POA: Diagnosis not present

## 2022-06-19 DIAGNOSIS — D649 Anemia, unspecified: Secondary | ICD-10-CM | POA: Diagnosis not present

## 2022-06-19 DIAGNOSIS — D529 Folate deficiency anemia, unspecified: Secondary | ICD-10-CM | POA: Diagnosis not present

## 2022-06-19 DIAGNOSIS — E1165 Type 2 diabetes mellitus with hyperglycemia: Secondary | ICD-10-CM | POA: Diagnosis not present

## 2022-06-19 DIAGNOSIS — E7849 Other hyperlipidemia: Secondary | ICD-10-CM | POA: Diagnosis not present

## 2022-06-19 DIAGNOSIS — I10 Essential (primary) hypertension: Secondary | ICD-10-CM | POA: Diagnosis not present

## 2022-06-21 DIAGNOSIS — I10 Essential (primary) hypertension: Secondary | ICD-10-CM | POA: Diagnosis not present

## 2022-06-21 DIAGNOSIS — Z0001 Encounter for general adult medical examination with abnormal findings: Secondary | ICD-10-CM | POA: Diagnosis not present

## 2022-06-21 DIAGNOSIS — E7849 Other hyperlipidemia: Secondary | ICD-10-CM | POA: Diagnosis not present

## 2022-06-21 DIAGNOSIS — G4733 Obstructive sleep apnea (adult) (pediatric): Secondary | ICD-10-CM | POA: Diagnosis not present

## 2022-06-21 DIAGNOSIS — F17210 Nicotine dependence, cigarettes, uncomplicated: Secondary | ICD-10-CM | POA: Diagnosis not present

## 2022-06-21 DIAGNOSIS — D649 Anemia, unspecified: Secondary | ICD-10-CM | POA: Diagnosis not present

## 2022-06-21 DIAGNOSIS — E1142 Type 2 diabetes mellitus with diabetic polyneuropathy: Secondary | ICD-10-CM | POA: Diagnosis not present

## 2022-06-22 DIAGNOSIS — I480 Paroxysmal atrial fibrillation: Secondary | ICD-10-CM | POA: Diagnosis not present

## 2022-06-22 DIAGNOSIS — D500 Iron deficiency anemia secondary to blood loss (chronic): Secondary | ICD-10-CM | POA: Diagnosis not present

## 2022-06-22 DIAGNOSIS — E8881 Metabolic syndrome: Secondary | ICD-10-CM | POA: Diagnosis not present

## 2022-06-22 DIAGNOSIS — E1342 Other specified diabetes mellitus with diabetic polyneuropathy: Secondary | ICD-10-CM | POA: Diagnosis not present

## 2022-06-22 DIAGNOSIS — E1142 Type 2 diabetes mellitus with diabetic polyneuropathy: Secondary | ICD-10-CM | POA: Diagnosis not present

## 2022-06-22 DIAGNOSIS — I10 Essential (primary) hypertension: Secondary | ICD-10-CM | POA: Diagnosis not present

## 2022-06-22 DIAGNOSIS — K580 Irritable bowel syndrome with diarrhea: Secondary | ICD-10-CM | POA: Diagnosis not present

## 2022-06-22 DIAGNOSIS — E7849 Other hyperlipidemia: Secondary | ICD-10-CM | POA: Diagnosis not present

## 2022-06-22 DIAGNOSIS — M1000 Idiopathic gout, unspecified site: Secondary | ICD-10-CM | POA: Diagnosis not present

## 2022-06-27 ENCOUNTER — Ambulatory Visit: Payer: Medicare HMO | Attending: Nurse Practitioner | Admitting: Nurse Practitioner

## 2022-06-27 ENCOUNTER — Encounter: Payer: Self-pay | Admitting: *Deleted

## 2022-06-27 ENCOUNTER — Encounter: Payer: Self-pay | Admitting: Nurse Practitioner

## 2022-06-27 VITALS — BP 116/80 | HR 80 | Ht >= 80 in | Wt 307.8 lb

## 2022-06-27 DIAGNOSIS — I10 Essential (primary) hypertension: Secondary | ICD-10-CM | POA: Diagnosis not present

## 2022-06-27 DIAGNOSIS — E669 Obesity, unspecified: Secondary | ICD-10-CM | POA: Diagnosis not present

## 2022-06-27 DIAGNOSIS — Z9189 Other specified personal risk factors, not elsewhere classified: Secondary | ICD-10-CM

## 2022-06-27 DIAGNOSIS — I480 Paroxysmal atrial fibrillation: Secondary | ICD-10-CM

## 2022-06-27 DIAGNOSIS — E785 Hyperlipidemia, unspecified: Secondary | ICD-10-CM | POA: Diagnosis not present

## 2022-06-27 MED ORDER — ATORVASTATIN CALCIUM 20 MG PO TABS: 20.0000 mg | ORAL_TABLET | Freq: Every evening | ORAL | 2 refills | Status: DC

## 2022-06-27 MED ORDER — METOPROLOL TARTRATE 100 MG PO TABS: 100.0000 mg | ORAL_TABLET | Freq: Two times a day (BID) | ORAL | 2 refills | Status: DC

## 2022-06-27 MED ORDER — AMLODIPINE BESYLATE 5 MG PO TABS: 5.0000 mg | ORAL_TABLET | Freq: Every day | ORAL | 2 refills | Status: DC

## 2022-06-27 MED ORDER — LOSARTAN POTASSIUM 100 MG PO TABS: 100.0000 mg | ORAL_TABLET | Freq: Every evening | ORAL | 2 refills | Status: DC

## 2022-06-27 NOTE — Patient Instructions (Addendum)
Medication Instructions:  Your physician recommends that you continue on your current medications as directed. Please refer to the Current Medication list given to you today.  Labwork: none  Testing/Procedures: none  Follow-Up: Your physician recommends that you schedule a follow-up appointment in: 3 months  Any Other Special Instructions Will Be Listed Below (If Applicable). You have been referred to [facility] Pulmonology You have been referred to [facility]  If you need a refill on your cardiac medications before your next appointment, please call your pharmacy.

## 2022-06-27 NOTE — Progress Notes (Signed)
Office Visit    Patient Name: Bradley Rodriguez Date of Encounter: 06/27/2022  PCP:  Richardean Chimera, MD   [facility]  Cardiologist:  Dina Rich, MD  Advanced Practice Provider:  No care team member to display Electrophysiologist:  None   Chief Complaint    Bradley Rodriguez is a 62 y.o. male with a hx of PAF, status post history of DCCV, hypertension, type 2 diabetes, history of kidney cancer and colon cancer, hx of GI bleeding, GERD, and hyperlipidemia, who presents today for scheduled follow-up.  Past Medical History    Past Medical History:  Diagnosis Date   A-fib [facility])    DCCV 2009, 2012, 2014, Nov 2016   Anxiety    Arthritis    Knees, wrist, ankles   Cancer (HCC) 07/2019   Kidney   Colon cancer (HCC)    Diabetes mellitus without complication (HCC)    Dysrhythmia 1996   A- Fib   GERD (gastroesophageal reflux disease)    History of kidney stones    HX: anticoagulation    very remotely and briefly on COumadin aound one of his CV, 2014 on Eliquis had hematuria with renal calculi and stopped   Hyperlipidemia    patient denies this   Hypertension    IBS (irritable bowel syndrome)    Normal cardiac stress test    Normal nuclear stress test , 2001   Past Surgical History:  Procedure Laterality Date   BIOPSY  08/19/2020   Procedure: BIOPSY;  Surgeon: Dolores Frame, MD;  [facility];  Service: Gastroenterology;;   CARDIOVERSION N/A 10/15/2012   Procedure: CARDIOVERSION;  Surgeon: Laurey Morale, MD;  Location: [facility] ENDOSCOPY;  Service: Cardiovascular;  Laterality: N/A;   CARDIOVERSION N/A 11/26/2014   Procedure: CARDIOVERSION;  Surgeon: Antoine Poche, MD;  Location: [facility] ORS;  Service: Endoscopy;  Laterality: N/A;   CARDIOVERSION N/A 11/15/2015   Procedure: CARDIOVERSION;  Surgeon: Jake Bathe, MD;  Location: [facility] ENDOSCOPY;  Service: Cardiovascular;  Laterality: N/A;   CARDIOVERSION N/A 08/07/2016   Procedure:  CARDIOVERSION;  Surgeon: Laqueta Linden, MD;  Location: [facility] ORS;  Service: Cardiovascular;  Laterality: N/A;   CARDIOVERSION N/A 09/11/2018   Procedure: CARDIOVERSION;  Surgeon: Antoine Poche, MD;  Location: [facility] ORS;  Service: Endoscopy;  Laterality: N/A;   CARDIOVERSION N/A 01/03/2021   Procedure: CARDIOVERSION;  Surgeon: Thomasene Ripple, DO;  Location: [facility] ENDOSCOPY;  Service: Cardiovascular;  Laterality: N/A;   CATARACT EXTRACTION W/PHACO Right 01/18/2014   Procedure: CATARACT EXTRACTION PHACO AND INTRAOCULAR LENS PLACEMENT (IOC);  Surgeon: Gemma Payor, MD;  Location: [facility] ORS;  Service: Ophthalmology;  Laterality: Right;  CDE 9.95   COLONOSCOPY WITH PROPOFOL N/A 08/19/2020   Procedure: COLONOSCOPY WITH PROPOFOL;  Surgeon: Dolores Frame, MD;  [facility];  Service: Gastroenterology;  Laterality: N/A;  8:20   COLONOSCOPY WITH PROPOFOL N/A 11/15/2021   Procedure: COLONOSCOPY WITH PROPOFOL;  Surgeon: Dolores Frame, MD;  [facility];  Service: Gastroenterology;  Laterality: N/A;  130 ASA 2   CYSTOSCOPY WITH LITHOLAPAXY N/A 10/06/2019   Procedure: CYSTOSCOPY WITH LASER LITHOLAPAXY;  Surgeon: Rene Paci, MD;  [facility]: [facility] ORS;  Service: Urology;  Laterality: N/A;   ESOPHAGOGASTRODUODENOSCOPY (EGD) WITH PROPOFOL N/A 08/19/2020   Procedure: ESOPHAGOGASTRODUODENOSCOPY (EGD) WITH PROPOFOL;  Surgeon: Dolores Frame, MD;  [facility];  Service: Gastroenterology;  Laterality: N/A;   EYE SURGERY Right    detached retina  FOOT SURGERY Left    KNEE ARTHROSCOPY Right    LAPAROSCOPIC RIGHT HEMI COLECTOMY N/A 09/30/2020   Procedure: LAPAROSCOPIC RIGHT HEMI COLECTOMY;  Surgeon: Andria Meuse, MD;  Location: [facility] ORS;  Service: General;  Laterality: N/A;   LITHOTRIPSY     for kidney stones 2002   POLYPECTOMY  08/19/2020   Procedure: POLYPECTOMY INTESTINAL;  Surgeon: Dolores Frame, MD;  [facility];   Service: Gastroenterology;;   POLYPECTOMY  08/19/2020   Procedure: POLYPECTOMY;  Surgeon: Dolores Frame, MD;  [facility];  Service: Gastroenterology;;   POLYPECTOMY  11/15/2021   Procedure: POLYPECTOMY;  Surgeon: Dolores Frame, MD;  [facility];  Service: Gastroenterology;;   ROBOT ASSISTED LAPAROSCOPIC NEPHRECTOMY Left 10/06/2019   Procedure: XI ROBOTIC ASSISTED LAPAROSCOPIC NEPHRECTOMY WITH ADRENALECTOMY;  Surgeon: Rene Paci, MD;  [facility]: [facility];  Service: Urology;  Laterality: Left;   SUBMUCOSAL TATTOO INJECTION  08/19/2020   Procedure: SUBMUCOSAL TATTOO INJECTION;  Surgeon: Dolores Frame, MD;  [facility];  Service: Gastroenterology;;   TEE WITHOUT CARDIOVERSION N/A 11/26/2014   Procedure: TRANSESOPHAGEAL ECHOCARDIOGRAM (TEE) WITH PROPOFOL;  Surgeon: Antoine Poche, MD;  [facility];  Service: Endoscopy;  Laterality: N/A;   TEE WITHOUT CARDIOVERSION N/A 11/15/2015   Procedure: TRANSESOPHAGEAL ECHOCARDIOGRAM (TEE);  Surgeon: Jake Bathe, MD;  Location: [facility] ENDOSCOPY;  Service: Cardiovascular;  Laterality: N/A;   TEE WITHOUT CARDIOVERSION N/A 09/11/2018   Procedure: TRANSESOPHAGEAL ECHOCARDIOGRAM (TEE) WITH PROPOFOL;  Surgeon: Antoine Poche, MD;  [facility]: [facility];  Service: Endoscopy;  Laterality: N/A;   TEE WITHOUT CARDIOVERSION N/A 01/03/2021   Procedure: TRANSESOPHAGEAL ECHOCARDIOGRAM (TEE);  Surgeon: Thomasene Ripple, DO;  Location: [facility] ENDOSCOPY;  Service: Cardiovascular;  Laterality: N/A;   VASECTOMY      Allergies  Allergies  Allergen Reactions   Ace Inhibitors Cough   Lisinopril Cough   Xarelto [Rivaroxaban] Other (See Comments)    Heart out of rhythm    Quinine Derivatives Rash    History of Present Illness    Bradley Rodriguez is a 62 y.o. male with a PMH as mentioned above.  Previous cardiovascular history includes past history of cardioversions.  Has also been followed in A-fib  clinic.  Last seen in our office by Rennis Harding, NP in 2022 for preoperative cardiovascular risk assessment.  He was pending urology surgery with Dr. Liliane Shi.  He was doing well from a cardiac perspective at the time.  Today he presents for overdue follow-up.  He states he is doing well. Denies any chest pain, shortness of breath, palpitations, syncope, presyncope, dizziness, orthopnea, PND, swelling or significant weight changes, acute bleeding, or claudication. EKG reveals rate controlled A-fib today, which patient states, "I'm surprised because normally I can tell when I'm in A-fib." Not on AC d/t hx of GI bleed. Has hx of about 21 DCCV per his report.   EKGs/Labs/Other Studies Reviewed:   The following studies were reviewed today:   EKG:  EKG is ordered today.  The ekg ordered today demonstrates A-fib, 80 bpm, no acute ischemic changes.   TEE/DCCV 12/2020:  1. Left ventricular ejection fraction, by estimation, is 60 to 65%. The  left ventricle has normal function. The left ventricle has no regional  wall motion abnormalities.   2. Right ventricular systolic function is normal. The right ventricular  size is normal.   3. No left atrial/left atrial appendage thrombus was detected. The LAA  emptying velocity was 75  cm/s.   4. The mitral valve is normal in structure. Mild mitral valve  regurgitation. No evidence of mitral stenosis.   5. The aortic valve is tricuspid. Aortic valve regurgitation is not  visualized. No aortic stenosis is present.   6. The inferior vena cava is normal in size with greater than 50%  respiratory variability, suggesting right atrial pressure of 3 mmHg.   7. Rhythm strip during this exam demostrated atrial fibrillation.   Conclusion(s)/Recommendation(s): Normal biventricular function without  evidence of hemodynamically significant valvular heart disease. No LA/LAA  thrombus identified. Successful cardioversion performed with restoration  of normal sinus  rhythm.   Recent Labs: 07/11/2021: Magnesium 1.9 10/17/2021: ALT 22; BUN 20; Creatinine, Ser 1.19; Hemoglobin 14.3; Platelets 252; Potassium 4.2; Sodium 140  Recent Lipid Panel No results found for: "CHOL", "TRIG", "HDL", "CHOLHDL", "VLDL", "LDLCALC", "LDLDIRECT"  Risk Assessment/Calculations:   CHA2DS2-VASc Score = 2  This indicates a 2.2% annual risk of stroke. The patient's score is based upon: CHF History: 0 HTN History: 1 Diabetes History: 1 Stroke History: 0 Vascular Disease History: 0 Age Score: 0 Gender Score: 0  Home Medications   Current Meds  Medication Sig   amLODipine (NORVASC) 5 MG tablet TAKE ONE TABLET BY MOUTH EVERY DAY   atorvastatin (LIPITOR) 20 MG tablet Take 20 mg by mouth every evening.    Barberry-Oreg Grape-Goldenseal (BERBERINE COMPLEX PO) Take 1 capsule by mouth daily.   cholecalciferol (VITAMIN D3) 25 MCG (1000 UNIT) tablet Take 1,000 Units by mouth daily.   cyanocobalamin (VITAMIN B12) 1000 MCG tablet Take 1,000 mcg by mouth daily.   DULoxetine (CYMBALTA) 30 MG capsule Take 30 mg by mouth daily.   iron polysaccharides (NIFEREX) 150 MG capsule Take 150 mg by mouth 2 (two) times daily.   losartan (COZAAR) 100 MG tablet Take 100 mg by mouth every evening.    MAGNESIUM CITRATE PO Take by mouth daily.   metoprolol tartrate (LOPRESSOR) 100 MG tablet TAKE ONE TABLET BY MOUTH TWICE DAILY   OVER THE COUNTER MEDICATION Take 1 tablet by mouth 2 (two) times daily. Glucocil otc supplement   Zinc 30 MG TABS Take 30 mg by mouth daily.     Review of Systems    All other systems reviewed and are otherwise negative except as noted above.  Physical Exam    VS:  BP 116/80   Pulse 80   Ht 6\' 8"  (2.032 m)   Wt (!) 307 lb 12.8 oz (139.6 kg)   SpO2 98%   BMI 33.81 kg/m  , BMI Body mass index is 33.81 kg/m.  Wt Readings from Last 3 Encounters:  06/27/22 (!) 307 lb 12.8 oz (139.6 kg)  11/15/21 284 lb (128.8 kg)  10/24/21 293 lb 3.4 oz (133 kg)     GEN:  Obese, 62 y.o. male in no acute distress. HEENT: normal. Neck: Supple, no JVD, carotid bruits, or masses. Cardiac: S1/S2, irregular rhythm and regular rate, no murmurs, rubs, or gallops. No clubbing, cyanosis, edema.  Radials/PT 2+ and equal bilaterally.  Respiratory:  Respirations regular and unlabored, clear to auscultation bilaterally. GI: Soft, nontender, nondistended. MS: No deformity or atrophy. Skin: Warm and dry, no rash. Neuro:  Strength and sensation are intact. Psych: Normal affect.  Assessment & Plan    PAF, at risk for OSA Denies any palpitations or tachycardia.  He is in rate controlled A-fib today, asymptomatic.  He has significant history of cardioversions. Not on AC d/t past hx of GI bleed and hx  of colon cancer, declines AC. Will refer to Pulmonary for OSA evaluation, said last sleep study was a long time ago. Will also refer him to A-fib clinic. Will request most recent labs from PCP's office. Will provide refills per his request. Continue Lopressor. Heart healthy diet and regular cardiovascular exercise encouraged.   HTN Blood pressure stable. Continue amlodipine, losartan, and lopressor. Discussed to monitor BP at home at least 2 hours after medications and sitting for 5-10 minutes. Heart healthy diet and regular cardiovascular exercise encouraged.   3. HLD Will request most recent labs from PCP's office. Will refill atorvastatin. Continue current medication regimen. Heart healthy diet and regular cardiovascular exercise encouraged.   4. Obesity BMI 33.81. Weight loss via diet and exercise encouraged. Discussed the impact being overweight would have on cardiovascular risk. Continue Ozempic.    Disposition: Will provide refills per his request. Follow up in 3 month(s) with Dina Rich, MD or APP.  Signed, Sharlene Dory, NP 06/27/2022, 10:00 AM [facility]

## 2022-07-23 ENCOUNTER — Encounter (HOSPITAL_COMMUNITY): Payer: Self-pay | Admitting: Internal Medicine

## 2022-07-23 ENCOUNTER — Ambulatory Visit (HOSPITAL_COMMUNITY)
Admission: RE | Admit: 2022-07-23 | Discharge: 2022-07-23 | Disposition: A | Discharge status: Home / Self Care | Payer: Medicare HMO | Source: Ambulatory Visit | Attending: Internal Medicine | Admitting: Internal Medicine

## 2022-07-23 VITALS — BP 130/82 | HR 88 | Ht >= 80 in | Wt 307.6 lb

## 2022-07-23 DIAGNOSIS — Z8249 Family history of ischemic heart disease and other diseases of the circulatory system: Secondary | ICD-10-CM | POA: Insufficient documentation

## 2022-07-23 DIAGNOSIS — E669 Obesity, unspecified: Secondary | ICD-10-CM | POA: Diagnosis not present

## 2022-07-23 DIAGNOSIS — I4891 Unspecified atrial fibrillation: Secondary | ICD-10-CM | POA: Insufficient documentation

## 2022-07-23 DIAGNOSIS — I480 Paroxysmal atrial fibrillation: Secondary | ICD-10-CM | POA: Diagnosis not present

## 2022-07-23 DIAGNOSIS — Z7901 Long term (current) use of anticoagulants: Secondary | ICD-10-CM | POA: Insufficient documentation

## 2022-07-23 DIAGNOSIS — I4819 Other persistent atrial fibrillation: Secondary | ICD-10-CM | POA: Diagnosis not present

## 2022-07-23 DIAGNOSIS — E785 Hyperlipidemia, unspecified: Secondary | ICD-10-CM | POA: Insufficient documentation

## 2022-07-23 DIAGNOSIS — D6869 Other thrombophilia: Secondary | ICD-10-CM | POA: Insufficient documentation

## 2022-07-23 DIAGNOSIS — E119 Type 2 diabetes mellitus without complications: Secondary | ICD-10-CM | POA: Diagnosis not present

## 2022-07-23 DIAGNOSIS — Z6833 Body mass index (BMI) 33.0-33.9, adult: Secondary | ICD-10-CM | POA: Diagnosis not present

## 2022-07-23 DIAGNOSIS — I10 Essential (primary) hypertension: Secondary | ICD-10-CM | POA: Diagnosis not present

## 2022-07-23 LAB — BASIC METABOLIC PANEL
Anion gap: 11 (ref 5–15)
BUN: 16 mg/dL (ref 8–23)
CO2: 25 mmol/L (ref 22–32)
Calcium: 9.3 mg/dL (ref 8.9–10.3)
Chloride: 100 mmol/L (ref 98–111)
Creatinine, Ser: 1.22 mg/dL (ref 0.61–1.24)
GFR, Estimated: >60 mL/min (ref >60)
Glucose, Bld: 145 mg/dL — ABNORMAL HIGH (ref 70–99)
Potassium: 4.9 mmol/L (ref 3.5–5.1)
Sodium: 136 mmol/L (ref 135–145)

## 2022-07-23 LAB — CBC
HCT: 46.1 % (ref 39.0–52.0)
Hemoglobin: 15.1 g/dL (ref 13.0–17.0)
MCH: 29.1 pg (ref 26.0–34.0)
MCHC: 32.8 g/dL (ref 30.0–36.0)
MCV: 88.8 fL (ref 80.0–100.0)
Platelets: 273 10*3/uL (ref 150–400)
RBC: 5.19 MIL/uL (ref 4.22–5.81)
RDW: 13.2 % (ref 11.5–15.5)
WBC: 5.7 10*3/uL (ref 4.0–10.5)
nRBC: 0 % (ref 0.0–0.2)

## 2022-07-23 MED ORDER — APIXABAN 5 MG PO TABS: 5.0000 mg | ORAL_TABLET | Freq: Two times a day (BID) | ORAL | 3 refills | Status: DC

## 2022-07-23 NOTE — Progress Notes (Signed)
Primary Care Physician: Richardean Chimera, MD Primary Cardiologist: Dr Wyline Mood Primary Electrophysiologist: none Referring Physician: [facility]   Bradley Rodriguez is a 62 y.o. male with a history of HTN, HLD, DM, colon cancer, atrial fibrillation who presents for consultation in the [facility]. The patient was initially diagnosed with atrial fibrillation in 1996. He has required several cardioversions over the years. He was seen at the ED 12/11/20 with palpitations. ECG showed rate controlled afib. Patient was started on warfarin for a CHADS2VASC score of 2. Per history in chart, he did not tolerate Eliquis or Xarelto. He is symptomatic with palpitations. His INR today was 1.7.   F/u in afib clinic, s/p successful  TEE guided cardioversion, 01/03/21. He feels improved. He remains in SR.   On follow up 07/23/22, he is currently in rate controlled Afib. Seen by Cardiology on 06/27/22 and noted to be in rate controlled Afib. Per history, not on anticoagulation due to history of GI bleeds. He does not have cardiac awareness at this time.   Today, he denies symptoms of chest pain, shortness of breath, orthopnea, PND, lower extremity edema, dizziness, presyncope, syncope, snoring, daytime somnolence, bleeding, or neurologic sequela. The patient is tolerating medications without difficulties and is otherwise without complaint today.    Atrial Fibrillation Risk Factors:  he does not have symptoms or diagnosis of sleep apnea. he does not have a history of rheumatic fever. he does not have a history of alcohol use. The patient does not have a history of early familial atrial fibrillation or other arrhythmias.  he has a BMI of Body mass index is 33.79 kg/m.Marland Kitchen Filed Weights   07/23/22 0846  Weight: (!) 139.5 kg     Family History  Problem Relation Age of Onset   Diabetes Mother    Hypertension Mother    Colon cancer Mother        dx. 60s   Diabetes Father    Colon  cancer Father        dx. 60s   Lung cancer Maternal Grandmother        smoked   Bone cancer Maternal Grandfather        dx. 80s   Hypertension Other     Atrial Fibrillation Management history:  Previous antiarrhythmic drugs: flecainide  Previous cardioversions: 2014, 2016, 2017, 2018, 2020 Previous ablations: none CHADS2VASC score: 2 Anticoagulation history: Xarelto, Eliquis, warfarin    Past Medical History:  Diagnosis Date   A-fib [facility])    DCCV 2009, 2012, 2014, Nov 2016   Anxiety    Arthritis    Knees, wrist, ankles   Cancer (HCC) 07/2019   Kidney   Colon cancer (HCC)    Diabetes mellitus without complication (HCC)    Dysrhythmia 1996   A- Fib   GERD (gastroesophageal reflux disease)    History of kidney stones    HX: anticoagulation    very remotely and briefly on COumadin aound one of his CV, 2014 on Eliquis had hematuria with renal calculi and stopped   Hyperlipidemia    patient denies this   Hypertension    IBS (irritable bowel syndrome)    Normal cardiac stress test    Normal nuclear stress test , 2001   Past Surgical History:  Procedure Laterality Date   BIOPSY  08/19/2020   Procedure: BIOPSY;  Surgeon: Dolores Frame, MD;  [facility];  Service: Gastroenterology;;   CARDIOVERSION N/A 10/15/2012   Procedure:  CARDIOVERSION;  Surgeon: Laurey Morale, MD;  Location: [facility] ENDOSCOPY;  Service: Cardiovascular;  Laterality: N/A;   CARDIOVERSION N/A 11/26/2014   Procedure: CARDIOVERSION;  Surgeon: Antoine Poche, MD;  Location: [facility] ORS;  Service: Endoscopy;  Laterality: N/A;   CARDIOVERSION N/A 11/15/2015   Procedure: CARDIOVERSION;  Surgeon: Jake Bathe, MD;  Location: [facility] ENDOSCOPY;  Service: Cardiovascular;  Laterality: N/A;   CARDIOVERSION N/A 08/07/2016   Procedure: CARDIOVERSION;  Surgeon: Laqueta Linden, MD;  Location: [facility];  Service: Cardiovascular;  Laterality: N/A;   CARDIOVERSION N/A 09/11/2018   Procedure: CARDIOVERSION;   Surgeon: Antoine Poche, MD;  [facility]: [facility];  Service: Endoscopy;  Laterality: N/A;   CARDIOVERSION N/A 01/03/2021   Procedure: CARDIOVERSION;  Surgeon: Thomasene Ripple, DO;  Location: [facility] ENDOSCOPY;  Service: Cardiovascular;  Laterality: N/A;   CATARACT EXTRACTION W/PHACO Right 01/18/2014   Procedure: CATARACT EXTRACTION PHACO AND INTRAOCULAR LENS PLACEMENT (IOC);  Surgeon: Gemma Payor, MD;  Location: [facility];  Service: Ophthalmology;  Laterality: Right;  CDE 9.95   COLONOSCOPY WITH PROPOFOL N/A 08/19/2020   Procedure: COLONOSCOPY WITH PROPOFOL;  Surgeon: Dolores Frame, MD;  [facility];  Service: Gastroenterology;  Laterality: N/A;  8:20   COLONOSCOPY WITH PROPOFOL N/A 11/15/2021   Procedure: COLONOSCOPY WITH PROPOFOL;  Surgeon: Dolores Frame, MD;  [facility];  Service: Gastroenterology;  Laterality: N/A;  130 ASA 2   CYSTOSCOPY WITH LITHOLAPAXY N/A 10/06/2019   Procedure: CYSTOSCOPY WITH LASER LITHOLAPAXY;  Surgeon: Rene Paci, MD;  [facility]: [facility];  Service: Urology;  Laterality: N/A;   ESOPHAGOGASTRODUODENOSCOPY (EGD) WITH PROPOFOL N/A 08/19/2020   Procedure: ESOPHAGOGASTRODUODENOSCOPY (EGD) WITH PROPOFOL;  Surgeon: Dolores Frame, MD;  [facility];  Service: Gastroenterology;  Laterality: N/A;   EYE SURGERY Right    detached retina   FOOT SURGERY Left    KNEE ARTHROSCOPY Right    LAPAROSCOPIC RIGHT HEMI COLECTOMY N/A 09/30/2020   Procedure: LAPAROSCOPIC RIGHT HEMI COLECTOMY;  Surgeon: Andria Meuse, MD;  Location: [facility];  Service: General;  Laterality: N/A;   LITHOTRIPSY     for kidney stones 2002   POLYPECTOMY  08/19/2020   Procedure: POLYPECTOMY INTESTINAL;  Surgeon: Dolores Frame, MD;  [facility];  Service: Gastroenterology;;   POLYPECTOMY  08/19/2020   Procedure: POLYPECTOMY;  Surgeon: Dolores Frame, MD;  [facility];  Service: Gastroenterology;;    POLYPECTOMY  11/15/2021   Procedure: POLYPECTOMY;  Surgeon: Dolores Frame, MD;  [facility];  Service: Gastroenterology;;   ROBOT ASSISTED LAPAROSCOPIC NEPHRECTOMY Left 10/06/2019   Procedure: XI ROBOTIC ASSISTED LAPAROSCOPIC NEPHRECTOMY WITH ADRENALECTOMY;  Surgeon: Rene Paci, MD;  [facility]: [facility];  Service: Urology;  Laterality: Left;   SUBMUCOSAL TATTOO INJECTION  08/19/2020   Procedure: SUBMUCOSAL TATTOO INJECTION;  Surgeon: Dolores Frame, MD;  [facility];  Service: Gastroenterology;;   TEE WITHOUT CARDIOVERSION N/A 11/26/2014   Procedure: TRANSESOPHAGEAL ECHOCARDIOGRAM (TEE) WITH PROPOFOL;  Surgeon: Antoine Poche, MD;  [facility];  Service: Endoscopy;  Laterality: N/A;   TEE WITHOUT CARDIOVERSION N/A 11/15/2015   Procedure: TRANSESOPHAGEAL ECHOCARDIOGRAM (TEE);  Surgeon: Jake Bathe, MD;  Location: [facility] ENDOSCOPY;  Service: Cardiovascular;  Laterality: N/A;   TEE WITHOUT CARDIOVERSION N/A 09/11/2018   Procedure: TRANSESOPHAGEAL ECHOCARDIOGRAM (TEE) WITH PROPOFOL;  Surgeon: Antoine Poche, MD;  Location: [facility];  Service: Endoscopy;  Laterality: N/A;   TEE WITHOUT CARDIOVERSION N/A 01/03/2021  Procedure: TRANSESOPHAGEAL ECHOCARDIOGRAM (TEE);  Surgeon: Thomasene Ripple, DO;  Location: [facility] ENDOSCOPY;  Service: Cardiovascular;  Laterality: N/A;   VASECTOMY      Current Outpatient Medications  Medication Sig Dispense Refill   amLODipine (NORVASC) 5 MG tablet Take 1 tablet (5 mg total) by mouth daily. 90 tablet 2   apixaban (ELIQUIS) 5 MG TABS tablet Take 1 tablet (5 mg total) by mouth 2 (two) times daily. 60 tablet 3   atorvastatin (LIPITOR) 20 MG tablet Take 1 tablet (20 mg total) by mouth every evening. 90 tablet 2   Barberry-Oreg Grape-Goldenseal (BERBERINE COMPLEX PO) Take 1 capsule by mouth daily.     cholecalciferol (VITAMIN D3) 25 MCG (1000 UNIT) tablet Take 1,000 Units by mouth daily.     DULoxetine (CYMBALTA)  30 MG capsule Take 30 mg by mouth daily.     iron polysaccharides (NIFEREX) 150 MG capsule Take 150 mg by mouth 2 (two) times daily.     losartan (COZAAR) 100 MG tablet Take 1 tablet (100 mg total) by mouth every evening. 90 tablet 2   MAGNESIUM CITRATE PO Take by mouth daily.     metoprolol tartrate (LOPRESSOR) 100 MG tablet Take 1 tablet (100 mg total) by mouth 2 (two) times daily. 180 tablet 2   OVER THE COUNTER MEDICATION Take 1 tablet by mouth 2 (two) times daily. Glucocil otc supplement     Zinc 30 MG TABS Take 30 mg by mouth daily.     cyanocobalamin (VITAMIN B12) 1000 MCG tablet Take 1,000 mcg by mouth daily. (Patient not taking: Reported on 07/23/2022)     No current facility-administered medications for this encounter.    Allergies  Allergen Reactions   Ace Inhibitors Cough   Lisinopril Cough   Xarelto [Rivaroxaban] Other (See Comments)    Heart out of rhythm    Quinine Derivatives Rash   ROS- All systems are reviewed and negative except as per the HPI above.  Physical Exam: Vitals:   07/23/22 0846  BP: 130/82  Pulse: 88  Weight: (!) 139.5 kg  Height: 6\' 8"  (2.032 m)    GEN- The patient is well appearing, alert and oriented x 3 today.   Head- normocephalic, atraumatic Eyes-  Sclera clear, conjunctiva pink Ears- hearing intact Lungs- Clear to ausculation bilaterally, normal work of breathing Heart- Irregular rate and rhythm, no murmurs, rubs or gallops, PMI not laterally displaced Extremities- no clubbing, cyanosis, or edema MS- no significant deformity or atrophy Skin- no rash or lesion Psych- euthymic mood, full affect Neuro- strength and sensation are intact   Wt Readings from Last 3 Encounters:  07/23/22 (!) 139.5 kg  06/27/22 (!) 139.6 kg  11/15/21 128.8 kg    EKG today demonstrates  Afib HR 88 PR * ms QRS 90 ms QT/Qtc 380/459 ms  TEE 09/11/18  1. The left ventricle has normal systolic function, with an ejection  fraction of 55-60%. The cavity  size was normal.   2. Left atrial size was Left atrium is enlarged.   3. No evidence of intracardiac thrombus. Normal LA appendage without  thrombus, normal emptying velocity 70cm/s.   4. Mitral valve regurgitation is mild to moderate by color flow Doppler.   5. The aortic valve is tricuspid No stenosis of the aortic valve.   6. The aortic root is normal in size and structure.   Epic records are reviewed at length today  CHA2DS2-VASc Score = 2  The patient's score is based upon: CHF History: 0 HTN  History: 1 Diabetes History: 1 Stroke History: 0 Vascular Disease History: 0 Age Score: 0 Gender Score: 0      ASSESSMENT AND PLAN: 1. Persistent Atrial Fibrillation (ICD10:  I48.19) The patient's CHA2DS2-VASc score is 2, indicating a 2.2% annual risk of stroke.   Patient has a long history of afib. Last DCCV was 2020. Successful TEE guided cardioversion 01/03/21.  He is currently in rate controlled Afib. We discussed treatment options and the necessity for anticoagulation. After discussion, we will proceed with scheduling TEE/DCCV and to begin Eliquis 5 mg BID for at least 5 doses prior to procedure. He understands will require anticoagulation for 4 weeks after cardioversion. Pamphlet on Watchman procedure given. He may possibly choose to restart coumadin after this but currently unsure. Review of records show possible hematuria as reason for discontinuation from Eliquis in the past.  Discussion about AAD options vs ablation going forward as indicated.   Informed Consent   Shared Decision Making/Informed Consent The risks [stroke, cardiac arrhythmias rarely resulting in the need for a temporary or permanent pacemaker, skin irritation or burns, esophageal damage, perforation (1:10,000 risk), bleeding, pharyngeal hematoma as well as other potential complications associated with conscious sedation including aspiration, arrhythmia, respiratory failure and death], benefits (treatment guidance,  restoration of normal sinus rhythm, diagnostic support) and alternatives of a transesophageal echocardiogram guided cardioversion were discussed in detail with Mr. Pont and he is willing to proceed.     2. Secondary Hypercoagulable State (ICD10:  D68.69) The patient is at significant risk for stroke/thromboembolism based upon his CHA2DS2-VASc Score of 2.   Begin Eliquis 5 mg BID for at least 5 doses prior to procedure.   3. Obesity Body mass index is 33.79 kg/m. Lifestyle modification was discussed at length including regular exercise and weight reduction.  4. HTN Stable, no changes today.   Follow up 1-2 weeks after DCCV.  [name], PA-C Afib Clinic [facility] [address] [place], [place] [address] [phone]

## 2022-07-23 NOTE — H&P (View-Only) (Signed)
  Primary Care Physician: Daniel, Terry G, MD Primary Cardiologist: Dr Branch Primary Electrophysiologist: none Referring Physician: Clarissa ED   Bradley Rodriguez is a 61 y.o. male with a history of HTN, HLD, DM, colon cancer, atrial fibrillation who presents for consultation in the [facility]. The patient was initially diagnosed with atrial fibrillation in 1996. He has required several cardioversions over the years. He was seen at the ED 12/11/20 with palpitations. ECG showed rate controlled afib. Patient was started on warfarin for a CHADS2VASC score of 2. Per history in chart, he did not tolerate Eliquis or Xarelto. He is symptomatic with palpitations. His INR today was 1.7.   F/u in afib clinic, s/p successful  TEE guided cardioversion, 01/03/21. He feels improved. He remains in SR.   On follow up 07/23/22, he is currently in rate controlled Afib. Seen by Cardiology on 06/27/22 and noted to be in rate controlled Afib. Per history, not on anticoagulation due to history of GI bleeds. He does not have cardiac awareness at this time.   Today, he denies symptoms of chest pain, shortness of breath, orthopnea, PND, lower extremity edema, dizziness, presyncope, syncope, snoring, daytime somnolence, bleeding, or neurologic sequela. The patient is tolerating medications without difficulties and is otherwise without complaint today.    Atrial Fibrillation Risk Factors:  he does not have symptoms or diagnosis of sleep apnea. he does not have a history of rheumatic fever. he does not have a history of alcohol use. The patient does not have a history of early familial atrial fibrillation or other arrhythmias.  he has a BMI of Body mass index is 33.79 kg/m.. Filed Weights   07/23/22 0846  Weight: (!) 139.5 kg     Family History  Problem Relation Age of Onset   Diabetes Mother    Hypertension Mother    Colon cancer Mother        dx. 60s   Diabetes Father    Colon  cancer Father        dx. 60s   Lung cancer Maternal Grandmother        smoked   Bone cancer Maternal Grandfather        dx. 80s   Hypertension Other     Atrial Fibrillation Management history:  Previous antiarrhythmic drugs: flecainide  Previous cardioversions: 2014, 2016, 2017, 2018, 2020 Previous ablations: none CHADS2VASC score: 2 Anticoagulation history: Xarelto, Eliquis, warfarin    Past Medical History:  Diagnosis Date   A-fib (HCC)    DCCV 2009, 2012, 2014, Nov 2016   Anxiety    Arthritis    Knees, wrist, ankles   Cancer (HCC) 07/2019   Kidney   Colon cancer (HCC)    Diabetes mellitus without complication (HCC)    Dysrhythmia 1996   A- Fib   GERD (gastroesophageal reflux disease)    History of kidney stones    HX: anticoagulation    very remotely and briefly on COumadin aound one of his CV, 2014 on Eliquis had hematuria with renal calculi and stopped   Hyperlipidemia    patient denies this   Hypertension    IBS (irritable bowel syndrome)    Normal cardiac stress test    Normal nuclear stress test , 2001   Past Surgical History:  Procedure Laterality Date   BIOPSY  08/19/2020   Procedure: BIOPSY;  Surgeon: Castaneda Mayorga, Daniel, MD;  [facility];  Service: Gastroenterology;;   CARDIOVERSION N/A 10/15/2012   Procedure:   CARDIOVERSION;  Surgeon: Dalton S McLean, MD;  [facility];  Service: Cardiovascular;  Laterality: N/A;   CARDIOVERSION N/A 11/26/2014   Procedure: CARDIOVERSION;  Surgeon: Jonathan F Branch, MD;  Location: [facility];  Service: Endoscopy;  Laterality: N/A;   CARDIOVERSION N/A 11/15/2015   Procedure: CARDIOVERSION;  Surgeon: Mark C Skains, MD;  [facility]: [facility];  Service: Cardiovascular;  Laterality: N/A;   CARDIOVERSION N/A 08/07/2016   Procedure: CARDIOVERSION;  Surgeon: Koneswaran, Suresh A, MD;  Location: [facility];  Service: Cardiovascular;  Laterality: N/A;   CARDIOVERSION N/A 09/11/2018   Procedure: CARDIOVERSION;   Surgeon: Branch, Jonathan F, MD;  Location: [facility];  Service: Endoscopy;  Laterality: N/A;   CARDIOVERSION N/A 01/03/2021   Procedure: CARDIOVERSION;  Surgeon: Tobb, Kardie, DO;  [facility];  Service: Cardiovascular;  Laterality: N/A;   CATARACT EXTRACTION W/PHACO Right 01/18/2014   Procedure: CATARACT EXTRACTION PHACO AND INTRAOCULAR LENS PLACEMENT (IOC);  Surgeon: Kerry Hunt, MD;  Location: [facility];  Service: Ophthalmology;  Laterality: Right;  CDE 9.95   COLONOSCOPY WITH PROPOFOL N/A 08/19/2020   Procedure: COLONOSCOPY WITH PROPOFOL;  Surgeon: Castaneda Mayorga, Daniel, MD;  [facility];  Service: Gastroenterology;  Laterality: N/A;  8:20   COLONOSCOPY WITH PROPOFOL N/A 11/15/2021   Procedure: COLONOSCOPY WITH PROPOFOL;  Surgeon: Castaneda Mayorga, Daniel, MD;  [facility];  Service: Gastroenterology;  Laterality: N/A;  130 ASA 2   CYSTOSCOPY WITH LITHOLAPAXY N/A 10/06/2019   Procedure: CYSTOSCOPY WITH LASER LITHOLAPAXY;  Surgeon: Winter, Christopher Aaron, MD;  Location: [facility];  Service: Urology;  Laterality: N/A;   ESOPHAGOGASTRODUODENOSCOPY (EGD) WITH PROPOFOL N/A 08/19/2020   Procedure: ESOPHAGOGASTRODUODENOSCOPY (EGD) WITH PROPOFOL;  Surgeon: Castaneda Mayorga, Daniel, MD;  [facility];  Service: Gastroenterology;  Laterality: N/A;   EYE SURGERY Right    detached retina   FOOT SURGERY Left    KNEE ARTHROSCOPY Right    LAPAROSCOPIC RIGHT HEMI COLECTOMY N/A 09/30/2020   Procedure: LAPAROSCOPIC RIGHT HEMI COLECTOMY;  Surgeon: White, Christopher M, MD;  Location: [facility];  Service: General;  Laterality: N/A;   LITHOTRIPSY     for kidney stones 2002   POLYPECTOMY  08/19/2020   Procedure: POLYPECTOMY INTESTINAL;  Surgeon: Castaneda Mayorga, Daniel, MD;  [facility];  Service: Gastroenterology;;   POLYPECTOMY  08/19/2020   Procedure: POLYPECTOMY;  Surgeon: Castaneda Mayorga, Daniel, MD;  [facility];  Service: Gastroenterology;;    POLYPECTOMY  11/15/2021   Procedure: POLYPECTOMY;  Surgeon: Castaneda Mayorga, Daniel, MD;  [facility];  Service: Gastroenterology;;   ROBOT ASSISTED LAPAROSCOPIC NEPHRECTOMY Left 10/06/2019   Procedure: XI ROBOTIC ASSISTED LAPAROSCOPIC NEPHRECTOMY WITH ADRENALECTOMY;  Surgeon: Winter, Christopher Aaron, MD;  Location: [facility];  Service: Urology;  Laterality: Left;   SUBMUCOSAL TATTOO INJECTION  08/19/2020   Procedure: SUBMUCOSAL TATTOO INJECTION;  Surgeon: Castaneda Mayorga, Daniel, MD;  [facility];  Service: Gastroenterology;;   TEE WITHOUT CARDIOVERSION N/A 11/26/2014   Procedure: TRANSESOPHAGEAL ECHOCARDIOGRAM (TEE) WITH PROPOFOL;  Surgeon: Jonathan F Branch, MD;  Location: [facility];  Service: Endoscopy;  Laterality: N/A;   TEE WITHOUT CARDIOVERSION N/A 11/15/2015   Procedure: TRANSESOPHAGEAL ECHOCARDIOGRAM (TEE);  Surgeon: Mark C Skains, MD;  [facility];  Service: Cardiovascular;  Laterality: N/A;   TEE WITHOUT CARDIOVERSION N/A 09/11/2018   Procedure: TRANSESOPHAGEAL ECHOCARDIOGRAM (TEE) WITH PROPOFOL;  Surgeon: Branch, Jonathan F, MD;  Location: [facility];  Service: Endoscopy;  Laterality: N/A;   TEE WITHOUT CARDIOVERSION N/A 01/03/2021     Procedure: TRANSESOPHAGEAL ECHOCARDIOGRAM (TEE);  Surgeon: Tobb, Kardie, DO;  [facility];  Service: Cardiovascular;  Laterality: N/A;   VASECTOMY      Current Outpatient Medications  Medication Sig Dispense Refill   amLODipine (NORVASC) 5 MG tablet Take 1 tablet (5 mg total) by mouth daily. 90 tablet 2   apixaban (ELIQUIS) 5 MG TABS tablet Take 1 tablet (5 mg total) by mouth 2 (two) times daily. 60 tablet 3   atorvastatin (LIPITOR) 20 MG tablet Take 1 tablet (20 mg total) by mouth every evening. 90 tablet 2   Barberry-Oreg Grape-Goldenseal (BERBERINE COMPLEX PO) Take 1 capsule by mouth daily.     cholecalciferol (VITAMIN D3) 25 MCG (1000 UNIT) tablet Take 1,000 Units by mouth daily.     DULoxetine (CYMBALTA)  30 MG capsule Take 30 mg by mouth daily.     iron polysaccharides (NIFEREX) 150 MG capsule Take 150 mg by mouth 2 (two) times daily.     losartan (COZAAR) 100 MG tablet Take 1 tablet (100 mg total) by mouth every evening. 90 tablet 2   MAGNESIUM CITRATE PO Take by mouth daily.     metoprolol tartrate (LOPRESSOR) 100 MG tablet Take 1 tablet (100 mg total) by mouth 2 (two) times daily. 180 tablet 2   OVER THE COUNTER MEDICATION Take 1 tablet by mouth 2 (two) times daily. Glucocil otc supplement     Zinc 30 MG TABS Take 30 mg by mouth daily.     cyanocobalamin (VITAMIN B12) 1000 MCG tablet Take 1,000 mcg by mouth daily. (Patient not taking: Reported on 07/23/2022)     No current facility-administered medications for this encounter.    Allergies  Allergen Reactions   Ace Inhibitors Cough   Lisinopril Cough   Xarelto [Rivaroxaban] Other (See Comments)    Heart out of rhythm    Quinine Derivatives Rash   ROS- All systems are reviewed and negative except as per the HPI above.  Physical Exam: Vitals:   07/23/22 0846  BP: 130/82  Pulse: 88  Weight: (!) 139.5 kg  Height: 6' 8" (2.032 m)    GEN- The patient is well appearing, alert and oriented x 3 today.   Head- normocephalic, atraumatic Eyes-  Sclera clear, conjunctiva pink Ears- hearing intact Lungs- Clear to ausculation bilaterally, normal work of breathing Heart- Irregular rate and rhythm, no murmurs, rubs or gallops, PMI not laterally displaced Extremities- no clubbing, cyanosis, or edema MS- no significant deformity or atrophy Skin- no rash or lesion Psych- euthymic mood, full affect Neuro- strength and sensation are intact   Wt Readings from Last 3 Encounters:  07/23/22 (!) 139.5 kg  06/27/22 (!) 139.6 kg  11/15/21 128.8 kg    EKG today demonstrates  Afib HR 88 PR * ms QRS 90 ms QT/Qtc 380/459 ms  TEE 09/11/18  1. The left ventricle has normal systolic function, with an ejection  fraction of 55-60%. The cavity  size was normal.   2. Left atrial size was Left atrium is enlarged.   3. No evidence of intracardiac thrombus. Normal LA appendage without  thrombus, normal emptying velocity 70cm/s.   4. Mitral valve regurgitation is mild to moderate by color flow Doppler.   5. The aortic valve is tricuspid No stenosis of the aortic valve.   6. The aortic root is normal in size and structure.   Epic records are reviewed at length today  CHA2DS2-VASc Score = 2  The patient's score is based upon: CHF History: 0 HTN   History: 1 Diabetes History: 1 Stroke History: 0 Vascular Disease History: 0 Age Score: 0 Gender Score: 0      ASSESSMENT AND PLAN: 1. Persistent Atrial Fibrillation (ICD10:  I48.19) The patient's CHA2DS2-VASc score is 2, indicating a 2.2% annual risk of stroke.   Patient has a long history of afib. Last DCCV was 2020. Successful TEE guided cardioversion 01/03/21.  He is currently in rate controlled Afib. We discussed treatment options and the necessity for anticoagulation. After discussion, we will proceed with scheduling TEE/DCCV and to begin Eliquis 5 mg BID for at least 5 doses prior to procedure. He understands will require anticoagulation for 4 weeks after cardioversion. Pamphlet on Watchman procedure given. He may possibly choose to restart coumadin after this but currently unsure. Review of records show possible hematuria as reason for discontinuation from Eliquis in the past.  Discussion about AAD options vs ablation going forward as indicated.   Informed Consent   Shared Decision Making/Informed Consent The risks [stroke, cardiac arrhythmias rarely resulting in the need for a temporary or permanent pacemaker, skin irritation or burns, esophageal damage, perforation (1:10,000 risk), bleeding, pharyngeal hematoma as well as other potential complications associated with conscious sedation including aspiration, arrhythmia, respiratory failure and death], benefits (treatment guidance,  restoration of normal sinus rhythm, diagnostic support) and alternatives of a transesophageal echocardiogram guided cardioversion were discussed in detail with Mr. Roscoe and he is willing to proceed.     2. Secondary Hypercoagulable State (ICD10:  D68.69) The patient is at significant risk for stroke/thromboembolism based upon his CHA2DS2-VASc Score of 2.   Begin Eliquis 5 mg BID for at least 5 doses prior to procedure.   3. Obesity Body mass index is 33.79 kg/m. Lifestyle modification was discussed at length including regular exercise and weight reduction.  4. HTN Stable, no changes today.   Follow up 1-2 weeks after DCCV.  Alayna Mabe, PA-C [facility] [address] [place], [place] [address] [phone]  

## 2022-07-23 NOTE — Patient Instructions (Addendum)
Start Eliquis 5mg  twice a day   Cardioversion scheduled for: Monday, July 8th   - Arrive at the [facility] and go to admitting at [facility] not eat or drink anything after midnight the night prior to your procedure.   - Take all your morning medication (except diabetic medications) with a sip of water prior to arrival.  - You will not be able to drive home after your procedure.    - Do NOT miss any doses of your blood thinner - if you should miss a dose please notify our office immediately.   - If you feel as if you go back into normal rhythm prior to scheduled cardioversion, please notify our office immediately.   If your procedure is canceled in the cardioversion suite you will be charged a cancellation fee.

## 2022-07-27 NOTE — Pre-Procedure Instructions (Signed)
Left message on patient's voicemail on phone regarding TEE/cardioversion on Monday, July 8th.  Left message for patient to arrive at 8:30 am, NPO after midnight  Instructed patient to have a ride home and responsible person to stay with patient for 24 hours after the procedure.  Instructed patient to not missed any doses of Eliquis and to take morning of surgery with sip of water.  Take BP meds in the AM with a sip of water

## 2022-07-30 ENCOUNTER — Ambulatory Visit (HOSPITAL_BASED_OUTPATIENT_CLINIC_OR_DEPARTMENT_OTHER)
Admission: RE | Admit: 2022-07-30 | Discharge: 2022-07-30 | Disposition: A | Discharge status: Home / Self Care | Payer: Medicare HMO | Source: Ambulatory Visit | Attending: Internal Medicine | Admitting: Internal Medicine

## 2022-07-30 ENCOUNTER — Ambulatory Visit (HOSPITAL_BASED_OUTPATIENT_CLINIC_OR_DEPARTMENT_OTHER): Payer: Medicare HMO | Admitting: Certified Registered"

## 2022-07-30 ENCOUNTER — Ambulatory Visit (HOSPITAL_COMMUNITY)
Admission: RE | Admit: 2022-07-30 | Discharge: 2022-07-30 | Disposition: A | Discharge status: Home / Self Care | Payer: Medicare HMO | Attending: Cardiovascular Disease | Admitting: Cardiovascular Disease

## 2022-07-30 ENCOUNTER — Other Ambulatory Visit: Payer: Self-pay

## 2022-07-30 ENCOUNTER — Encounter (HOSPITAL_COMMUNITY)
Admission: RE | Disposition: A | Discharge status: Home / Self Care | Payer: Self-pay | Source: Home / Self Care | Attending: Cardiovascular Disease

## 2022-07-30 ENCOUNTER — Ambulatory Visit (HOSPITAL_COMMUNITY): Payer: Medicare HMO | Admitting: Certified Registered"

## 2022-07-30 DIAGNOSIS — E119 Type 2 diabetes mellitus without complications: Secondary | ICD-10-CM | POA: Diagnosis not present

## 2022-07-30 DIAGNOSIS — I4891 Unspecified atrial fibrillation: Secondary | ICD-10-CM

## 2022-07-30 DIAGNOSIS — I340 Nonrheumatic mitral (valve) insufficiency: Secondary | ICD-10-CM

## 2022-07-30 DIAGNOSIS — D6869 Other thrombophilia: Secondary | ICD-10-CM | POA: Diagnosis not present

## 2022-07-30 DIAGNOSIS — Z6833 Body mass index (BMI) 33.0-33.9, adult: Secondary | ICD-10-CM | POA: Insufficient documentation

## 2022-07-30 DIAGNOSIS — I10 Essential (primary) hypertension: Secondary | ICD-10-CM | POA: Diagnosis not present

## 2022-07-30 DIAGNOSIS — E785 Hyperlipidemia, unspecified: Secondary | ICD-10-CM | POA: Diagnosis not present

## 2022-07-30 DIAGNOSIS — E669 Obesity, unspecified: Secondary | ICD-10-CM | POA: Diagnosis not present

## 2022-07-30 DIAGNOSIS — I4819 Other persistent atrial fibrillation: Secondary | ICD-10-CM | POA: Diagnosis not present

## 2022-07-30 DIAGNOSIS — Z85038 Personal history of other malignant neoplasm of large intestine: Secondary | ICD-10-CM | POA: Insufficient documentation

## 2022-07-30 HISTORY — PX: TEE WITHOUT CARDIOVERSION: SHX5443

## 2022-07-30 HISTORY — PX: CARDIOVERSION: SHX1299

## 2022-07-30 LAB — GLUCOSE, CAPILLARY: Glucose-Capillary: 142 mg/dL — ABNORMAL HIGH (ref 70–99)

## 2022-07-30 LAB — ECHO TEE

## 2022-07-30 SURGERY — ECHOCARDIOGRAM, TRANSESOPHAGEAL
Anesthesia: Monitor Anesthesia Care

## 2022-07-30 MED ORDER — PROPOFOL 500 MG/50ML IV EMUL
INTRAVENOUS | Status: DC | PRN
  Administered 2022-07-30: 30 mg via INTRAVENOUS
  Administered 2022-07-30: 200 ug/kg/min via INTRAVENOUS
  Administered 2022-07-30: 20 mg via INTRAVENOUS

## 2022-07-30 MED ORDER — SODIUM CHLORIDE 0.9 % IV SOLN: INTRAVENOUS | Status: DC

## 2022-07-30 MED ORDER — SODIUM CHLORIDE 0.9 % IV SOLN
INTRAVENOUS | Status: DC
  Administered 2022-07-30: 20 mL/h via INTRAVENOUS

## 2022-07-30 MED ORDER — EPHEDRINE SULFATE-NACL 50-0.9 MG/10ML-% IV SOSY
PREFILLED_SYRINGE | INTRAVENOUS | Status: DC | PRN
  Administered 2022-07-30: 10 mg via INTRAVENOUS

## 2022-07-30 SURGICAL SUPPLY — 1 items: ELECT DEFIB PAD ADLT CADENCE (PAD) ×2 IMPLANT

## 2022-07-30 NOTE — Transfer of Care (Signed)
Immediate Anesthesia Transfer of Care Note  Patient: Bradley Rodriguez  Procedure(s) Performed: TRANSESOPHAGEAL ECHOCARDIOGRAM CARDIOVERSION  Patient Location: Cath Lab  Anesthesia Type:MAC  Level of Consciousness: awake, drowsy, and patient cooperative  Airway & Oxygen Therapy: Patient Spontanous Breathing and Patient connected to nasal cannula oxygen  Post-op Assessment: Report given to RN and Post -op Vital signs reviewed and stable  Post vital signs: Reviewed and stable  Last Vitals:  Vitals Value Taken Time  BP    Temp    Pulse 53 07/30/22 1039  Resp 19 07/30/22 1039  SpO2 97 % 07/30/22 1039  Vitals shown include unvalidated device data.  Last Pain: There were no vitals filed for this visit.       Complications: No notable events documented.

## 2022-07-30 NOTE — Op Note (Signed)
INDICATIONS: atrial fibrillation   PROCEDURE:   Informed consent was obtained prior to the procedure. The risks, benefits and alternatives for the procedure were discussed and the patient comprehended these risks.  Risks include, but are not limited to, cough, sore throat, vomiting, nausea, somnolence, esophageal and stomach trauma or perforation, bleeding, low blood pressure, aspiration, pneumonia, infection, trauma to the teeth and death.    After a procedural time-out, the oropharynx was anesthetized with 20% benzocaine spray.   During this procedure the patient was administered IV propofol by Anesthesiology, Dr. Mal Amabile.  The transesophageal probe was inserted in the esophagus and stomach without difficulty and multiple views were obtained.  The patient was kept under observation until the patient left the procedure room.  The patient left the procedure room in stable condition.   Agitated microbubble saline contrast was not administered.  COMPLICATIONS:    There were no immediate complications.  FINDINGS:  No LA thrombus. Dilated left atrium. Normal LV function. Moderate MR due to a very small area of flail motion of the P3 (medial) scallop of the posterior mitral valve.  RECOMMENDATIONS:     Proceed with DCCV.  Time Spent Directly with the Patient:  30 minutes   Bradley Rodriguez 07/30/2022, 10:30 AM

## 2022-07-30 NOTE — Interval H&P Note (Signed)
History and Physical Interval Note:  07/30/2022 8:54 AM  Bradley Rodriguez  has presented today for surgery, with the diagnosis of AFIB.  The various methods of treatment have been discussed with the patient and family. After consideration of risks, benefits and other options for treatment, the patient has consented to  Procedure(s): TRANSESOPHAGEAL ECHOCARDIOGRAM (N/A) CARDIOVERSION (N/A) as a surgical intervention.  The patient's history has been reviewed, patient examined, no change in status, stable for surgery.  I have reviewed the patient's chart and labs.  Questions were answered to the patient's satisfaction.     Kynnedi Zweig

## 2022-07-30 NOTE — Anesthesia Postprocedure Evaluation (Signed)
Anesthesia Post Note  Patient: Bradley Rodriguez  Procedure(s) Performed: TRANSESOPHAGEAL ECHOCARDIOGRAM CARDIOVERSION     Patient location during evaluation: PACU Anesthesia Type: General Level of consciousness: awake and alert Pain management: pain level controlled Vital Signs Assessment: post-procedure vital signs reviewed and stable Respiratory status: spontaneous breathing, nonlabored ventilation and respiratory function stable Cardiovascular status: stable and blood pressure returned to baseline Anesthetic complications: no   No notable events documented.  Last Vitals:  Vitals:   07/30/22 1100 07/30/22 1105  BP: 97/61 95/71  Pulse: (!) 51 (!) 52  Resp: 15 19  Temp:    SpO2: 96% 93%    Last Pain:  Vitals:   07/30/22 1038  TempSrc: Temporal  PainSc: 0-No pain                 Beryle Lathe

## 2022-07-30 NOTE — Op Note (Signed)
Procedure: Electrical Cardioversion Indications:  Atrial Fibrillation  Procedure Details:  Consent: Risks of procedure as well as the alternatives and risks of each were explained to the (patient/caregiver).  Consent for procedure obtained.  Time Out: Verified patient identification, verified procedure, site/side was marked, verified correct patient position, special equipment/implants available, medications/allergies/relevent history reviewed, required imaging and test results available.  Performed  Patient placed on cardiac monitor, pulse oximetry, supplemental oxygen as necessary.  Sedation given:  propofol IV Pacer pads placed anterior and posterior chest.  Cardioverted 2 time(s).  Cardioversion with synchronized biphasic 200J shock (unsuccessful), successful when repeated with pressure on the anterior pad.  Evaluation: Findings: Post procedure EKG shows: NSR Complications: None Patient did tolerate procedure well.  Time Spent Directly with the Patient:  30 minutes   Bradley Rodriguez 07/30/2022, 10:33 AM

## 2022-07-30 NOTE — Progress Notes (Signed)
  Echocardiogram Echocardiogram Transesophageal has been performed.  Janalyn Harder 07/30/2022, 10:37 AM

## 2022-07-30 NOTE — Anesthesia Preprocedure Evaluation (Addendum)
Anesthesia Evaluation  Patient identified by MRN, date of birth, ID band Patient awake    Reviewed: Allergy & Precautions, NPO status , Patient's Chart, lab work & pertinent test results  History of Anesthesia Complications Negative for: history of anesthetic complications  Airway Mallampati: I  TM Distance: >3 FB Neck ROM: Full    Dental  (+) Dental Advisory Given, Chipped   Pulmonary neg pulmonary ROS   Pulmonary exam normal        Cardiovascular hypertension, Pt. on medications and Pt. on home beta blockers + dysrhythmias Atrial Fibrillation  Rhythm:Irregular Rate:Normal   '22 TTE - EF 60 to 65%. No left atrial/left atrial appendage thrombus was detected. The LAA emptying velocity was 75 cm/s. Mild mitral valve regurgitation.      Neuro/Psych  PSYCHIATRIC DISORDERS Anxiety     negative neurological ROS     GI/Hepatic Neg liver ROS,GERD  Controlled,, IBS    Endo/Other  diabetes, Type 2   Obesity   Renal/GU  Renal cell cancer      Musculoskeletal  (+) Arthritis ,    Abdominal   Peds  Hematology  On eliquis    Anesthesia Other Findings   Reproductive/Obstetrics                             Anesthesia Physical Anesthesia Plan  ASA: 3  Anesthesia Plan: MAC   Post-op Pain Management:    Induction: Intravenous  PONV Risk Score and Plan: 1 and Propofol infusion and Treatment may vary due to age or medical condition  Airway Management Planned: Nasal Cannula and Natural Airway  Additional Equipment: None  Intra-op Plan:   Post-operative Plan:   Informed Consent: I have reviewed the patients History and Physical, chart, labs and discussed the procedure including the risks, benefits and alternatives for the proposed anesthesia with the patient or authorized representative who has indicated his/her understanding and acceptance.       Plan Discussed with: CRNA and  Anesthesiologist  Anesthesia Plan Comments: (May begin procedure as MAC with conversion to GA as indicated by procedure )       Anesthesia Quick Evaluation

## 2022-07-31 ENCOUNTER — Encounter (HOSPITAL_COMMUNITY): Payer: Self-pay | Admitting: Cardiovascular Disease

## 2022-08-02 ENCOUNTER — Encounter: Payer: Self-pay | Admitting: *Deleted

## 2022-08-02 ENCOUNTER — Telehealth: Payer: Self-pay | Admitting: *Deleted

## 2022-08-02 NOTE — Telephone Encounter (Signed)
Lesle Chris, LPN 10/06/7827  5:62 PM EDT Back to Top    Notified, copy to pcp.   Lesle Chris, LPN 02/21/8655  8:46 PM EDT     Left message to return call.   Sharlene Dory, NP 07/11/2022  4:22 PM EDT     Labs reviewed. Overall stable except for his cholesterol panel and mildly elevated K+ level and kidney function (PCP to manage). He needs to continue taking Atorvastatin and adhere to Mediterranean diet. Recommend updating FLP prior to next OV, and if LDL is not at goal, would recommend increasing his atorvastatin.   Thanks!

## 2022-08-02 NOTE — Telephone Encounter (Signed)
Mediterranean diet info sent via mychart.

## 2022-08-07 ENCOUNTER — Ambulatory Visit (HOSPITAL_COMMUNITY): Payer: Medicare HMO | Admitting: Internal Medicine

## 2022-08-27 ENCOUNTER — Encounter (HOSPITAL_COMMUNITY): Payer: Self-pay | Admitting: Internal Medicine

## 2022-08-27 ENCOUNTER — Ambulatory Visit (HOSPITAL_COMMUNITY)
Admission: RE | Admit: 2022-08-27 | Discharge: 2022-08-27 | Disposition: A | Discharge status: Home / Self Care | Payer: Medicare HMO | Source: Ambulatory Visit | Attending: Internal Medicine | Admitting: Internal Medicine

## 2022-08-27 VITALS — BP 128/84 | HR 56 | Ht >= 80 in | Wt 308.4 lb

## 2022-08-27 DIAGNOSIS — I4819 Other persistent atrial fibrillation: Secondary | ICD-10-CM | POA: Diagnosis not present

## 2022-08-27 DIAGNOSIS — E669 Obesity, unspecified: Secondary | ICD-10-CM | POA: Diagnosis not present

## 2022-08-27 DIAGNOSIS — Z6833 Body mass index (BMI) 33.0-33.9, adult: Secondary | ICD-10-CM | POA: Insufficient documentation

## 2022-08-27 DIAGNOSIS — I10 Essential (primary) hypertension: Secondary | ICD-10-CM | POA: Diagnosis not present

## 2022-08-27 DIAGNOSIS — D6869 Other thrombophilia: Secondary | ICD-10-CM | POA: Insufficient documentation

## 2022-08-27 NOTE — Progress Notes (Signed)
Primary Care Physician: Richardean Chimera, MD Primary Cardiologist: Dr Wyline Mood Primary Electrophysiologist: none Referring Physician: [facility]   Bradley Rodriguez is a 62 y.o. male with a history of HTN, HLD, DM, colon cancer, atrial fibrillation who presents for consultation in the [facility]. The patient was initially diagnosed with atrial fibrillation in 1996. He has required several cardioversions over the years. He was seen at the ED 12/11/20 with palpitations. ECG showed rate controlled afib. Patient is currently on Eliquis for a Chadsvasc score of 2.   F/u in afib clinic, s/p successful  TEE guided cardioversion, 01/03/21. He feels improved. He remains in SR.   On follow up 07/23/22, he is currently in rate controlled Afib. Seen by Cardiology on 06/27/22 and noted to be in rate controlled Afib. Per history, not on anticoagulation due to history of GI bleeds. He does not have cardiac awareness at this time.   On follow up 08/07/22, he is currently in NSR. He is s/p successful DCCV on 07/30/22 with conversion to NSR after 2 shocks. Since then, he has not had any episodes of Afib. Remains on Eliquis 5 mg BID without missed doses.   Today, he denies symptoms of chest pain, shortness of breath, orthopnea, PND, lower extremity edema, dizziness, presyncope, syncope, snoring, daytime somnolence, bleeding, or neurologic sequela. The patient is tolerating medications without difficulties and is otherwise without complaint today.    Atrial Fibrillation Risk Factors:  he does not have symptoms or diagnosis of sleep apnea. he does not have a history of rheumatic fever. he does not have a history of alcohol use. The patient does not have a history of early familial atrial fibrillation or other arrhythmias.  he has a BMI of Body mass index is 33.88 kg/m.Marland Kitchen Filed Weights   08/27/22 0938  Weight: (!) 139.9 kg     Family History  Problem Relation Age of Onset   Diabetes  Mother    Hypertension Mother    Colon cancer Mother        dx. 60s   Diabetes Father    Colon cancer Father        dx. 60s   Lung cancer Maternal Grandmother        smoked   Bone cancer Maternal Grandfather        dx. 80s   Hypertension Other     Atrial Fibrillation Management history:  Previous antiarrhythmic drugs: flecainide  Previous cardioversions: 2014, 2016, 2017, 2018, 2020, 07/30/22 Previous ablations: none CHADS2VASC score: 2 Anticoagulation history: Xarelto, Eliquis, warfarin    Past Medical History:  Diagnosis Date   A-fib [facility])    DCCV 2009, 2012, 2014, Nov 2016   Anxiety    Arthritis    Knees, wrist, ankles   Cancer (HCC) 07/2019   Kidney   Colon cancer (HCC)    Diabetes mellitus without complication (HCC)    Dysrhythmia 1996   A- Fib   GERD (gastroesophageal reflux disease)    History of kidney stones    HX: anticoagulation    very remotely and briefly on COumadin aound one of his CV, 2014 on Eliquis had hematuria with renal calculi and stopped   Hyperlipidemia    patient denies this   Hypertension    IBS (irritable bowel syndrome)    Normal cardiac stress test    Normal nuclear stress test , 2001    Current Outpatient Medications  Medication Sig Dispense Refill   amLODipine (NORVASC) 5  MG tablet Take 1 tablet (5 mg total) by mouth daily. 90 tablet 2   atorvastatin (LIPITOR) 20 MG tablet Take 1 tablet (20 mg total) by mouth every evening. 90 tablet 2   Barberry-Oreg Grape-Goldenseal (BERBERINE COMPLEX PO) Take 1 capsule by mouth daily.     cholecalciferol (VITAMIN D3) 25 MCG (1000 UNIT) tablet Take 1,000 Units by mouth daily.     DULoxetine (CYMBALTA) 60 MG capsule Take 60 mg by mouth daily.     iron polysaccharides (NIFEREX) 150 MG capsule Take 150 mg by mouth 2 (two) times daily.     losartan (COZAAR) 100 MG tablet Take 1 tablet (100 mg total) by mouth every evening. 90 tablet 2   magnesium gluconate (MAGONATE) 500 MG tablet Take 500 mg by  mouth daily.     metoprolol tartrate (LOPRESSOR) 100 MG tablet Take 1 tablet (100 mg total) by mouth 2 (two) times daily. 180 tablet 2   OVER THE COUNTER MEDICATION Take 1 tablet by mouth 2 (two) times daily. Glucocil OTC supplement for blood sugar     OVER THE COUNTER MEDICATION Take 1 tablet by mouth at bedtime. Neuroturna 600 mg for nerve pain     zinc gluconate 50 MG tablet Take 50 mg by mouth daily.     apixaban (ELIQUIS) 5 MG TABS tablet Take 1 tablet (5 mg total) by mouth 2 (two) times daily. (Patient not taking: Reported on 08/27/2022) 60 tablet 3   No current facility-administered medications for this encounter.    Allergies  Allergen Reactions   Ace Inhibitors Cough   Lisinopril Cough   Xarelto [Rivaroxaban] Other (See Comments)    Heart out of rhythm    Quinine Derivatives Rash   ROS- All systems are reviewed and negative except as per the HPI above.  Physical Exam: Vitals:   08/27/22 0938  BP: 128/84  Pulse: (!) 56  Weight: (!) 139.9 kg  Height: 6\' 8"  (2.032 m)    GEN- The patient is well appearing, alert and oriented x 3 today.   Neck - no JVD or carotid bruit noted Lungs- Clear to ausculation bilaterally, normal work of breathing Heart- Regular rate and rhythm, no murmurs, rubs or gallops, PMI not laterally displaced Extremities- no clubbing, cyanosis, or edema Skin - no rash or ecchymosis noted   Wt Readings from Last 3 Encounters:  08/27/22 (!) 139.9 kg  07/23/22 (!) 139.5 kg  06/27/22 (!) 139.6 kg    EKG today demonstrates  Vent. rate 56 BPM PR interval 164 ms QRS duration 98 ms QT/QTcB 442/426 ms P-R-T axes 61 62 61 Sinus bradycardia with occasional Premature ventricular complexes and Premature atrial complexes Otherwise normal ECG When compared with ECG of 30-Jul-2022 10:51, PREVIOUS ECG IS PRESENT  TEE 09/11/18  1. The left ventricle has normal systolic function, with an ejection  fraction of 55-60%. The cavity size was normal.   2. Left  atrial size was Left atrium is enlarged.   3. No evidence of intracardiac thrombus. Normal LA appendage without  thrombus, normal emptying velocity 70cm/s.   4. Mitral valve regurgitation is mild to moderate by color flow Doppler.   5. The aortic valve is tricuspid No stenosis of the aortic valve.   6. The aortic root is normal in size and structure.   Epic records are reviewed at length today  CHA2DS2-VASc Score = 2  The patient's score is based upon: CHF History: 0 HTN History: 1 Diabetes History: 1 Stroke History: 0 Vascular Disease  History: 0 Age Score: 0 Gender Score: 0      ASSESSMENT AND PLAN: 1. Persistent Atrial Fibrillation (ICD10:  I48.19) The patient's CHA2DS2-VASc score is 2, indicating a 2.2% annual risk of stroke.   Patient has a long history of afib. Last DCCV was 2020. Successful TEE guided cardioversion 01/03/21. S/p successful TEE/DCCV on 07/30/22.  He is currently in NSR.   Continue with conservative observation for now. Continue metoprolol 100 mg BID.   2. Secondary Hypercoagulable State (ICD10:  D68.69) The patient is at significant risk for stroke/thromboembolism based upon his CHA2DS2-VASc Score of 2.  No missed doses.   3. Obesity Body mass index is 33.88 kg/m. Lifestyle modification was discussed at length including regular exercise and weight reduction.  4. HTN Stable, no changes today.   Follow up 3 months.   [name], PA-C Afib Clinic [facility] [address] [place], [place] [address] [phone]

## 2022-09-26 NOTE — Progress Notes (Signed)
Office Visit    Patient Name: Bradley Rodriguez Date of Encounter: 09/27/2022 PCP:  Richardean Chimera, MD [facility]  Cardiologist:  Dina Rich, MD  Advanced Practice Provider:  No care team member to display Electrophysiologist:  None   Chief Complaint and HPI    Bradley Rodriguez is a 62 y.o. male with a hx of PAF, status post history of DCCV, hypertension, type 2 diabetes, history of kidney cancer and colon cancer, hx of GI bleeding, IDA, GERD, and hyperlipidemia, who presents today for scheduled follow-up.  Previous cardiovascular history includes past history of cardioversions.  Has also been followed in A-fib clinic.  Seen by Rennis Harding, NP in 2022 for preoperative cardiovascular risk assessment.  He was pending urology surgery with Dr. Liliane Shi.  He was doing well from a cardiac perspective at the time.  I last saw him for follow-up on June 27, 2022.  Denied any chest pain, shortness of breath, palpitations, syncope, presyncope, dizziness, orthopnea, PND, swelling or significant weight changes, acute bleeding, or claudication. EKG revealed rate controlled A-fib today, which patient stated, "I'm surprised because normally I can tell when I'm in A-fib." Not on AC d/t hx of GI bleed. Patient reported hx of about 21 DCCV per his report. Was referred to [facility] and Pulmonology.   Seen in A-fib clinic on July 23, 2022.  Started on [facility]. Underwent successful TEE/DCCV on 07/30/2022 with DCCV after 2 shocks with conversion to NSR. Seen in A-fib Clinic for follow-up on 08/27/2022 and was noted had no episodes of A-fib since.   Today he presents for scheduled follow-up.  He states he is doing well. Denies any chest pain, shortness of breath, palpitations, syncope, presyncope, dizziness, orthopnea, PND, swelling or significant weight changes, or claudication. When reviewing medication list with him, says he is no longer on Eliquis as he completed 30 days of Eliquis after the  DCCV, says he discussed this with provider at A-fib clinic. Says he has had a hx of bloody stools with his history of kidney cancer. When asking him if he has noticed any dark/tarry stools or evidence of GI bleeding, says he is unsure but denies any recent bleeding. Currently on iron supplement for diagnosis of IDA.   EKGs/Labs/Other Studies Reviewed:   The following studies were reviewed today:   EKG:  EKG is not ordered today.   TEE 07/30/2022: PROCEDURE: After discussion of the risks and benefits of a TEE, an  informed consent was obtained from the patient. The transesophogeal probe  was passed without difficulty through the esophogus of the patient. Imaged  were obtained with the patient in a  left lateral decubitus position. Sedation performed by different  physician. The patient was monitored while under deep sedation.  Anesthestetic sedation was provided intravenously by Anesthesiology: 470mg   of Propofol. Image quality was excellent. The  patient's vital signs; including heart rate, blood pressure, and oxygen  saturation; remained stable throughout the procedure. The patient  developed no complications during the procedure. A successful direct  current cardioversion was performed at 200  joules with 2 attempts.    IMPRESSIONS     1. Left ventricular ejection fraction, by estimation, is 50 to 55%. The  left ventricle has low normal function. The left ventricle has no regional wall motion abnormalities.   2. Right ventricular systolic function is normal. The right ventricular  size is normal.   3. Left atrial size was severely dilated. No left atrial/left atrial  appendage thrombus was detected. The LAA emptying velocity was 24 cm/s.   4. Right atrial size was severely dilated.   5. There is very small segment of flail motion of the P3 (medial) scallop of the posterior mitral valve. The mitral valve is abnormal. Mild to moderate mitral valve regurgitation. No evidence of mitral  stenosis.   6. The aortic valve is tricuspid. Aortic valve regurgitation is trivial.  No aortic stenosis is present.   TEE/DCCV 12/2020:  1. Left ventricular ejection fraction, by estimation, is 60 to 65%. The  left ventricle has normal function. The left ventricle has no regional  wall motion abnormalities.   2. Right ventricular systolic function is normal. The right ventricular  size is normal.   3. No left atrial/left atrial appendage thrombus was detected. The LAA  emptying velocity was 75 cm/s.   4. The mitral valve is normal in structure. Mild mitral valve  regurgitation. No evidence of mitral stenosis.   5. The aortic valve is tricuspid. Aortic valve regurgitation is not  visualized. No aortic stenosis is present.   6. The inferior vena cava is normal in size with greater than 50%  respiratory variability, suggesting right atrial pressure of 3 mmHg.   7. Rhythm strip during this exam demostrated atrial fibrillation.   Conclusion(s)/Recommendation(s): Normal biventricular function without  evidence of hemodynamically significant valvular heart disease. No LA/LAA  thrombus identified. Successful cardioversion performed with restoration  of normal sinus rhythm.   Risk Assessment/Calculations:   CHA2DS2-VASc Score = 2  This indicates a 2.2% annual risk of stroke. The patient's score is based upon: CHF History: 0 HTN History: 1 Diabetes History: 1 Stroke History: 0 Vascular Disease History: 0 Age Score: 0 Gender Score: 0  Review of Systems    All other systems reviewed and are otherwise negative except as noted above.  Physical Exam    VS:  BP 124/76   Pulse (!) 58   Ht 6\' 8"  (2.032 m)   Wt (!) 312 lb 12.8 oz (141.9 kg)   SpO2 95%   BMI 34.36 kg/m  , BMI Body mass index is 34.36 kg/m.  Wt Readings from Last 3 Encounters:  09/27/22 (!) 312 lb 12.8 oz (141.9 kg)  08/27/22 (!) 308 lb 6.4 oz (139.9 kg)  07/23/22 (!) 307 lb 9.6 oz (139.5 kg)     GEN: Obese, 62  y.o. male in no acute distress. HEENT: normal. Neck: Supple, no JVD, carotid bruits, or masses. Cardiac: S1/S2, RRR, no murmurs, rubs, or gallops. No clubbing, cyanosis, edema.  Radials/PT 2+ and equal bilaterally.  Respiratory:  Respirations regular and unlabored, clear to auscultation bilaterally. GI: Soft, nontender, nondistended. MS: No deformity or atrophy. Skin: Warm and dry, no rash. Neuro:  Strength and sensation are intact. Psych: Normal affect.  Assessment & Plan    PAF, at risk for OSA Denies any palpitations or tachycardia. HR well controlled today.  He has significant history of cardioversions and tolerated recent DCCV well. No longer on AC d/t past hx of GI bleed and hx of cancer, politely declines to resume AC - completed 30 days of Eliquis.  Continue Lopressor. Heart healthy diet and regular cardiovascular exercise encouraged. Continue to follow-up with A-fib clinic and previously referred to Pulmonology for OSA evaluation. Hgb 07/23/2022 15.1, says PCP will be getting upcoming labs.   HTN Blood pressure stable. Continue amlodipine, losartan, and lopressor. Discussed to monitor BP at home at least 2 hours after medications and sitting for 5-10 minutes.  Heart healthy diet and regular cardiovascular exercise encouraged.   3. HLD Labs faxed from PCP's office 05/2022 show elevated TG and TC at 234, no LDL noted. Recommended the importance of Mediterranean diet and continue atorvastatin. Heart healthy diet and regular cardiovascular exercise encouraged. Says he is due for upcoming labs with PCP - will have PCP fax over results to our office.   4. Obesity Weight loss via diet and exercise encouraged. Discussed the impact being overweight would have on cardiovascular risk. Continue Ozempic.   5. IDA Hx of GI bleeding and IDA. Denies any recent bleeding but unsure of if he has seen any blood in his stool. Most recent Hgb stable - due for labs soon with PCP. Continue iron supplement and  continue to follow with PCP. Care precautions discussed.  Disposition: Follow up in 6 months with Dina Rich, MD or APP.  Signed, Sharlene Dory, NP

## 2022-09-27 ENCOUNTER — Ambulatory Visit: Payer: Medicare HMO | Attending: Nurse Practitioner | Admitting: Nurse Practitioner

## 2022-09-27 ENCOUNTER — Encounter: Payer: Self-pay | Admitting: Nurse Practitioner

## 2022-09-27 VITALS — BP 124/76 | HR 58 | Ht >= 80 in | Wt 312.8 lb

## 2022-09-27 DIAGNOSIS — Z9189 Other specified personal risk factors, not elsewhere classified: Secondary | ICD-10-CM

## 2022-09-27 DIAGNOSIS — E785 Hyperlipidemia, unspecified: Secondary | ICD-10-CM | POA: Diagnosis not present

## 2022-09-27 DIAGNOSIS — D509 Iron deficiency anemia, unspecified: Secondary | ICD-10-CM

## 2022-09-27 DIAGNOSIS — E669 Obesity, unspecified: Secondary | ICD-10-CM

## 2022-09-27 DIAGNOSIS — I10 Essential (primary) hypertension: Secondary | ICD-10-CM | POA: Diagnosis not present

## 2022-09-27 DIAGNOSIS — I480 Paroxysmal atrial fibrillation: Secondary | ICD-10-CM | POA: Diagnosis not present

## 2022-09-27 NOTE — Patient Instructions (Addendum)
Medication Instructions:  Your physician recommends that you continue on your current medications as directed. Please refer to the Current Medication list given to you today.  Labwork: Have PCP fax labs to [name]   Testing/Procedures: None  Follow-Up: Your physician recommends that you schedule a follow-up appointment in: 6 Months with Dr.Branch   Any Other Special Instructions Will Be Listed Below (If Applicable).  If you need a refill on your cardiac medications before your next appointment, please call your pharmacy.

## 2022-10-31 ENCOUNTER — Ambulatory Visit (HOSPITAL_COMMUNITY)
Admission: RE | Admit: 2022-10-31 | Discharge: 2022-10-31 | Disposition: A | Discharge status: Home / Self Care | Payer: Medicare HMO | Source: Ambulatory Visit | Attending: Internal Medicine | Admitting: Internal Medicine

## 2022-10-31 VITALS — BP 134/100 | HR 103 | Ht >= 80 in | Wt 315.2 lb

## 2022-10-31 DIAGNOSIS — I119 Hypertensive heart disease without heart failure: Secondary | ICD-10-CM | POA: Insufficient documentation

## 2022-10-31 DIAGNOSIS — Z7901 Long term (current) use of anticoagulants: Secondary | ICD-10-CM | POA: Insufficient documentation

## 2022-10-31 DIAGNOSIS — D6869 Other thrombophilia: Secondary | ICD-10-CM | POA: Insufficient documentation

## 2022-10-31 DIAGNOSIS — Z6834 Body mass index (BMI) 34.0-34.9, adult: Secondary | ICD-10-CM | POA: Insufficient documentation

## 2022-10-31 DIAGNOSIS — E669 Obesity, unspecified: Secondary | ICD-10-CM | POA: Diagnosis not present

## 2022-10-31 DIAGNOSIS — I4819 Other persistent atrial fibrillation: Secondary | ICD-10-CM | POA: Diagnosis not present

## 2022-10-31 DIAGNOSIS — Z79899 Other long term (current) drug therapy: Secondary | ICD-10-CM | POA: Diagnosis not present

## 2022-10-31 LAB — CBC
HCT: 43 % (ref 39.0–52.0)
Hemoglobin: 14.2 g/dL (ref 13.0–17.0)
MCH: 29.6 pg (ref 26.0–34.0)
MCHC: 33 g/dL (ref 30.0–36.0)
MCV: 89.6 fL (ref 80.0–100.0)
Platelets: 266 10*3/uL (ref 150–400)
RBC: 4.8 MIL/uL (ref 4.22–5.81)
RDW: 13.7 % (ref 11.5–15.5)
WBC: 5.4 10*3/uL (ref 4.0–10.5)
nRBC: 0 % (ref 0.0–0.2)

## 2022-10-31 LAB — BASIC METABOLIC PANEL
Anion gap: 13 (ref 5–15)
BUN: 18 mg/dL (ref 8–23)
CO2: 22 mmol/L (ref 22–32)
Calcium: 9.5 mg/dL (ref 8.9–10.3)
Chloride: 103 mmol/L (ref 98–111)
Creatinine, Ser: 1.33 mg/dL — ABNORMAL HIGH (ref 0.61–1.24)
GFR, Estimated: >60 mL/min (ref >60)
Glucose, Bld: 139 mg/dL — ABNORMAL HIGH (ref 70–99)
Potassium: 4.4 mmol/L (ref 3.5–5.1)
Sodium: 138 mmol/L (ref 135–145)

## 2022-10-31 MED ORDER — MULTAQ 400 MG PO TABS: 400.0000 mg | ORAL_TABLET | Freq: Two times a day (BID) | ORAL | 3 refills | Status: DC

## 2022-10-31 NOTE — Progress Notes (Addendum)
Primary Care Physician: Richardean Chimera, MD Primary Cardiologist: Dr Wyline Mood Primary Electrophysiologist: none Referring Physician: [facility]   Bradley Rodriguez is a 62 y.o. male with a history of HTN, HLD, DM, colon cancer, atrial fibrillation who presents for consultation in the [facility]. The patient was initially diagnosed with atrial fibrillation in 1996. He has required several cardioversions over the years. He was seen at the ED 12/11/20 with palpitations. ECG showed rate controlled afib. Patient is currently on Eliquis for a Chadsvasc score of 2.   F/u in afib clinic, s/p successful  TEE guided cardioversion, 01/03/21. He feels improved. He remains in SR.   On follow up 07/23/22, he is currently in rate controlled Afib. Seen by Cardiology on 06/27/22 and noted to be in rate controlled Afib. Per history, not on anticoagulation due to history of GI bleeds. He does not have cardiac awareness at this time.   On follow up 08/07/22, he is currently in NSR. He is s/p successful DCCV on 07/30/22 with conversion to NSR after 2 shocks. Since then, he has not had any episodes of Afib. Remains on Eliquis 5 mg BID without missed doses.   On follow up 10/31/22, he is currently in Afib. He was seen by Cardiology on 9/5 and note to be in rate controlled Afib; patient was asymptomatic at that time per report. He tells me today his primary symptom is feeling tired when in Afib. He is not on anticoagulation due to history of GI bleeds and restarted Eliquis on Thursday evening; took 2 doses of Eliquis on Friday 10/4.   Today, he denies symptoms of chest pain, shortness of breath, orthopnea, PND, lower extremity edema, dizziness, presyncope, syncope, snoring, daytime somnolence, bleeding, or neurologic sequela. The patient is tolerating medications without difficulties and is otherwise without complaint today.    Atrial Fibrillation Risk Factors:  he does not have symptoms or  diagnosis of sleep apnea. he does not have a history of rheumatic fever. he does not have a history of alcohol use. The patient does not have a history of early familial atrial fibrillation or other arrhythmias.  he has a BMI of Body mass index is 34.63 kg/m.Marland Kitchen Filed Weights   10/31/22 0907  Weight: (!) 143 kg      Family History  Problem Relation Age of Onset   Diabetes Mother    Hypertension Mother    Colon cancer Mother        dx. 60s   Diabetes Father    Colon cancer Father        dx. 60s   Lung cancer Maternal Grandmother        smoked   Bone cancer Maternal Grandfather        dx. 80s   Hypertension Other     Atrial Fibrillation Management history:  Previous antiarrhythmic drugs: flecainide  Previous cardioversions: 2014, 2016, 2017, 2018, 2020, 07/30/22 Previous ablations: none CHADS2VASC score: 2 Anticoagulation history: Xarelto, Eliquis, warfarin    Past Medical History:  Diagnosis Date   A-fib [facility])    DCCV 2009, 2012, 2014, Nov 2016   Anxiety    Arthritis    Knees, wrist, ankles   Cancer (HCC) 07/2019   Kidney   Colon cancer (HCC)    Diabetes mellitus without complication (HCC)    Dysrhythmia 1996   A- Fib   GERD (gastroesophageal reflux disease)    History of kidney stones    HX: anticoagulation  very remotely and briefly on COumadin aound one of his CV, 2014 on Eliquis had hematuria with renal calculi and stopped   Hyperlipidemia    patient denies this   Hypertension    IBS (irritable bowel syndrome)    Normal cardiac stress test    Normal nuclear stress test , 2001    Current Outpatient Medications  Medication Sig Dispense Refill   amLODipine (NORVASC) 5 MG tablet Take 1 tablet (5 mg total) by mouth daily. 90 tablet 2   apixaban (ELIQUIS) 5 MG TABS tablet Take 1 tablet (5 mg total) by mouth 2 (two) times daily. 60 tablet 3   atorvastatin (LIPITOR) 20 MG tablet Take 1 tablet (20 mg total) by mouth every evening. 90 tablet 2    Barberry-Oreg Grape-Goldenseal (BERBERINE COMPLEX PO) Take 1 capsule by mouth daily.     cholecalciferol (VITAMIN D3) 25 MCG (1000 UNIT) tablet Take 1,000 Units by mouth daily.     dronedarone (MULTAQ) 400 MG tablet Take 1 tablet (400 mg total) by mouth 2 (two) times daily with a meal. 60 tablet 3   DULoxetine (CYMBALTA) 60 MG capsule Take 60 mg by mouth daily.     iron polysaccharides (NIFEREX) 150 MG capsule Take 150 mg by mouth 2 (two) times daily.     losartan (COZAAR) 100 MG tablet Take 1 tablet (100 mg total) by mouth every evening. 90 tablet 2   magnesium gluconate (MAGONATE) 500 MG tablet Take 500 mg by mouth daily.     metoprolol tartrate (LOPRESSOR) 100 MG tablet Take 1 tablet (100 mg total) by mouth 2 (two) times daily. 180 tablet 2   OVER THE COUNTER MEDICATION Take 1 tablet by mouth 2 (two) times daily. Glucocil OTC supplement for blood sugar     zinc gluconate 50 MG tablet Take 50 mg by mouth daily.     No current facility-administered medications for this encounter.    Allergies  Allergen Reactions   Ace Inhibitors Cough   Lisinopril Cough   Xarelto [Rivaroxaban] Other (See Comments)    Heart out of rhythm    Quinine Derivatives Rash   ROS- All systems are reviewed and negative except as per the HPI above.  Physical Exam: Vitals:   10/31/22 0907  BP: (!) 134/100  Pulse: (!) 103  Weight: (!) 143 kg  Height: 6\' 8"  (2.032 m)    GEN- The patient is well appearing, alert and oriented x 3 today.   Neck - no JVD or carotid bruit noted Lungs- Clear to ausculation bilaterally, normal work of breathing Heart- Irregular rate and rhythm, no murmurs, rubs or gallops, PMI not laterally displaced Extremities- no clubbing, cyanosis, or edema Skin - no rash or ecchymosis noted   Wt Readings from Last 3 Encounters:  10/31/22 (!) 143 kg  09/27/22 (!) 141.9 kg  08/27/22 (!) 139.9 kg    EKG today demonstrates  Vent. rate 103 BPM PR interval * ms QRS duration 100  ms QT/QTcB 344/450 ms P-R-T axes * 70 63 Atrial fibrillation with rapid ventricular response with premature ventricular or aberrantly conducted complexes Abnormal ECG When compared with ECG of 27-Aug-2022 09:48, PREVIOUS ECG IS PRESENT  TEE 09/11/18  1. The left ventricle has normal systolic function, with an ejection  fraction of 55-60%. The cavity size was normal.   2. Left atrial size was Left atrium is enlarged.   3. No evidence of intracardiac thrombus. Normal LA appendage without  thrombus, normal emptying velocity 70cm/s.   4.  Mitral valve regurgitation is mild to moderate by color flow Doppler.   5. The aortic valve is tricuspid No stenosis of the aortic valve.   6. The aortic root is normal in size and structure.   Epic records are reviewed at length today  CHA2DS2-VASc Score = 2  The patient's score is based upon: CHF History: 0 HTN History: 1 Diabetes History: 1 Stroke History: 0 Vascular Disease History: 0 Age Score: 0 Gender Score: 0      ASSESSMENT AND PLAN: 1. Persistent Atrial Fibrillation (ICD10:  I48.19) The patient's CHA2DS2-VASc score is 2, indicating a 2.2% annual risk of stroke.   Patient has a long history of afib. Last DCCV was 2020. Successful TEE guided cardioversion 01/03/21. S/p successful TEE/DCCV on 07/30/22.  He is currently in Afib.   We discussed rhythm control options today including Multaq, flecainide, Tikosyn, and amiodarone. The latter would be only as bridge to ablation given age. Review of echo shows severe biatrial enlargement. I advised patient this may make ablation difficult / prohibitive. He states he remembered talking to Dr. Graciela Husbands about ablation years ago. He would like to speak with EP regarding possibility of ablation. After discussion of medication options, he would like to try Multaq and repeat cardioversion. Advised will be scheduled 3 weeks from restart of Eliquis. We will obtain labs today and have him start medication 4 days  prior to cardioversion.  Continue metoprolol 100 mg BID.   PR 164 ms Qtc 426 ms  Informed Consent   Shared Decision Making/Informed Consent The risks (stroke, cardiac arrhythmias rarely resulting in the need for a temporary or permanent pacemaker, skin irritation or burns and complications associated with conscious sedation including aspiration, arrhythmia, respiratory failure and death), benefits (restoration of normal sinus rhythm) and alternatives of a direct current cardioversion were explained in detail to Mr. Wilford and he agrees to proceed.      2. Secondary Hypercoagulable State (ICD10:  D68.69) The patient is at significant risk for stroke/thromboembolism based upon his CHA2DS2-VASc Score of 2.  No missed doses since 10/4. I gave pamphlet on Watchman procedure.   3. Obesity Body mass index is 34.63 kg/m. Lifestyle modification was discussed at length including regular exercise and weight reduction.  4. HTN Stable, no changes today.   Will arrange to speak with EP for the possibility of ablation.  [name], PA-C Afib Clinic [facility] [address] [place], [place] [address] [phone]

## 2022-10-31 NOTE — H&P (View-Only) (Signed)
Primary Care Physician: Richardean Chimera, MD Primary Cardiologist: Dr Wyline Mood Primary Electrophysiologist: none Referring Physician: [facility]   Bradley Rodriguez is a 62 y.o. male with a history of HTN, HLD, DM, colon cancer, atrial fibrillation who presents for consultation in the [facility]. The patient was initially diagnosed with atrial fibrillation in 1996. He has required several cardioversions over the years. He was seen at the ED 12/11/20 with palpitations. ECG showed rate controlled afib. Patient is currently on Eliquis for a Chadsvasc score of 2.   F/u in afib clinic, s/p successful  TEE guided cardioversion, 01/03/21. He feels improved. He remains in SR.   On follow up 07/23/22, he is currently in rate controlled Afib. Seen by Cardiology on 06/27/22 and noted to be in rate controlled Afib. Per history, not on anticoagulation due to history of GI bleeds. He does not have cardiac awareness at this time.   On follow up 08/07/22, he is currently in NSR. He is s/p successful DCCV on 07/30/22 with conversion to NSR after 2 shocks. Since then, he has not had any episodes of Afib. Remains on Eliquis 5 mg BID without missed doses.   On follow up 10/31/22, he is currently in Afib. He was seen by Cardiology on 9/5 and note to be in rate controlled Afib; patient was asymptomatic at that time per report. He tells me today his primary symptom is feeling tired when in Afib. He is not on anticoagulation due to history of GI bleeds and restarted Eliquis on Thursday evening; took 2 doses of Eliquis on Friday 10/4.   Today, he denies symptoms of chest pain, shortness of breath, orthopnea, PND, lower extremity edema, dizziness, presyncope, syncope, snoring, daytime somnolence, bleeding, or neurologic sequela. The patient is tolerating medications without difficulties and is otherwise without complaint today.    Atrial Fibrillation Risk Factors:  he does not have symptoms or  diagnosis of sleep apnea. he does not have a history of rheumatic fever. he does not have a history of alcohol use. The patient does not have a history of early familial atrial fibrillation or other arrhythmias.  he has a BMI of Body mass index is 34.63 kg/m.Marland Kitchen Filed Weights   10/31/22 0907  Weight: (!) 143 kg      Family History  Problem Relation Age of Onset   Diabetes Mother    Hypertension Mother    Colon cancer Mother        dx. 60s   Diabetes Father    Colon cancer Father        dx. 60s   Lung cancer Maternal Grandmother        smoked   Bone cancer Maternal Grandfather        dx. 80s   Hypertension Other     Atrial Fibrillation Management history:  Previous antiarrhythmic drugs: flecainide  Previous cardioversions: 2014, 2016, 2017, 2018, 2020, 07/30/22 Previous ablations: none CHADS2VASC score: 2 Anticoagulation history: Xarelto, Eliquis, warfarin    Past Medical History:  Diagnosis Date   A-fib [facility])    DCCV 2009, 2012, 2014, Nov 2016   Anxiety    Arthritis    Knees, wrist, ankles   Cancer (HCC) 07/2019   Kidney   Colon cancer (HCC)    Diabetes mellitus without complication (HCC)    Dysrhythmia 1996   A- Fib   GERD (gastroesophageal reflux disease)    History of kidney stones    HX: anticoagulation  very remotely and briefly on COumadin aound one of his CV, 2014 on Eliquis had hematuria with renal calculi and stopped   Hyperlipidemia    patient denies this   Hypertension    IBS (irritable bowel syndrome)    Normal cardiac stress test    Normal nuclear stress test , 2001    Current Outpatient Medications  Medication Sig Dispense Refill   amLODipine (NORVASC) 5 MG tablet Take 1 tablet (5 mg total) by mouth daily. 90 tablet 2   apixaban (ELIQUIS) 5 MG TABS tablet Take 1 tablet (5 mg total) by mouth 2 (two) times daily. 60 tablet 3   atorvastatin (LIPITOR) 20 MG tablet Take 1 tablet (20 mg total) by mouth every evening. 90 tablet 2    Barberry-Oreg Grape-Goldenseal (BERBERINE COMPLEX PO) Take 1 capsule by mouth daily.     cholecalciferol (VITAMIN D3) 25 MCG (1000 UNIT) tablet Take 1,000 Units by mouth daily.     dronedarone (MULTAQ) 400 MG tablet Take 1 tablet (400 mg total) by mouth 2 (two) times daily with a meal. 60 tablet 3   DULoxetine (CYMBALTA) 60 MG capsule Take 60 mg by mouth daily.     iron polysaccharides (NIFEREX) 150 MG capsule Take 150 mg by mouth 2 (two) times daily.     losartan (COZAAR) 100 MG tablet Take 1 tablet (100 mg total) by mouth every evening. 90 tablet 2   magnesium gluconate (MAGONATE) 500 MG tablet Take 500 mg by mouth daily.     metoprolol tartrate (LOPRESSOR) 100 MG tablet Take 1 tablet (100 mg total) by mouth 2 (two) times daily. 180 tablet 2   OVER THE COUNTER MEDICATION Take 1 tablet by mouth 2 (two) times daily. Glucocil OTC supplement for blood sugar     zinc gluconate 50 MG tablet Take 50 mg by mouth daily.     No current facility-administered medications for this encounter.    Allergies  Allergen Reactions   Ace Inhibitors Cough   Lisinopril Cough   Xarelto [Rivaroxaban] Other (See Comments)    Heart out of rhythm    Quinine Derivatives Rash   ROS- All systems are reviewed and negative except as per the HPI above.  Physical Exam: Vitals:   10/31/22 0907  BP: (!) 134/100  Pulse: (!) 103  Weight: (!) 143 kg  Height: 6\' 8"  (2.032 m)    GEN- The patient is well appearing, alert and oriented x 3 today.   Neck - no JVD or carotid bruit noted Lungs- Clear to ausculation bilaterally, normal work of breathing Heart- Irregular rate and rhythm, no murmurs, rubs or gallops, PMI not laterally displaced Extremities- no clubbing, cyanosis, or edema Skin - no rash or ecchymosis noted   Wt Readings from Last 3 Encounters:  10/31/22 (!) 143 kg  09/27/22 (!) 141.9 kg  08/27/22 (!) 139.9 kg    EKG today demonstrates  Vent. rate 103 BPM PR interval * ms QRS duration 100  ms QT/QTcB 344/450 ms P-R-T axes * 70 63 Atrial fibrillation with rapid ventricular response with premature ventricular or aberrantly conducted complexes Abnormal ECG When compared with ECG of 27-Aug-2022 09:48, PREVIOUS ECG IS PRESENT  TEE 09/11/18  1. The left ventricle has normal systolic function, with an ejection  fraction of 55-60%. The cavity size was normal.   2. Left atrial size was Left atrium is enlarged.   3. No evidence of intracardiac thrombus. Normal LA appendage without  thrombus, normal emptying velocity 70cm/s.   4.  Mitral valve regurgitation is mild to moderate by color flow Doppler.   5. The aortic valve is tricuspid No stenosis of the aortic valve.   6. The aortic root is normal in size and structure.   Epic records are reviewed at length today  CHA2DS2-VASc Score = 2  The patient's score is based upon: CHF History: 0 HTN History: 1 Diabetes History: 1 Stroke History: 0 Vascular Disease History: 0 Age Score: 0 Gender Score: 0      ASSESSMENT AND PLAN: 1. Persistent Atrial Fibrillation (ICD10:  I48.19) The patient's CHA2DS2-VASc score is 2, indicating a 2.2% annual risk of stroke.   Patient has a long history of afib. Last DCCV was 2020. Successful TEE guided cardioversion 01/03/21. S/p successful TEE/DCCV on 07/30/22.  He is currently in Afib.   We discussed rhythm control options today including Multaq, flecainide, Tikosyn, and amiodarone. The latter would be only as bridge to ablation given age. Review of echo shows severe biatrial enlargement. I advised patient this may make ablation difficult / prohibitive. He states he remembered talking to Dr. Graciela Husbands about ablation years ago. He would like to speak with EP regarding possibility of ablation. After discussion of medication options, he would like to try Multaq and repeat cardioversion. Advised will be scheduled 3 weeks from restart of Eliquis. We will obtain labs today and have him start medication 4 days  prior to cardioversion.  Continue metoprolol 100 mg BID.   PR 164 ms Qtc 426 ms  Informed Consent   Shared Decision Making/Informed Consent The risks (stroke, cardiac arrhythmias rarely resulting in the need for a temporary or permanent pacemaker, skin irritation or burns and complications associated with conscious sedation including aspiration, arrhythmia, respiratory failure and death), benefits (restoration of normal sinus rhythm) and alternatives of a direct current cardioversion were explained in detail to Bradley Rodriguez and he agrees to proceed.      2. Secondary Hypercoagulable State (ICD10:  D68.69) The patient is at significant risk for stroke/thromboembolism based upon his CHA2DS2-VASc Score of 2.  No missed doses since 10/4. I gave pamphlet on Watchman procedure.   3. Obesity Body mass index is 34.63 kg/m. Lifestyle modification was discussed at length including regular exercise and weight reduction.  4. HTN Stable, no changes today.   Will arrange to speak with EP for the possibility of ablation.  [name], PA-C Afib Clinic [facility] [address] [place], [place] [address] [phone]

## 2022-10-31 NOTE — Patient Instructions (Signed)
Start Multaq 400mg  twice a day on 10/25  Cardioversion scheduled for: Tuesday, October 29th   - Arrive at the [facility] and go to admitting at 8am   - Do not eat or drink anything after midnight the night prior to your procedure.   - Take all your morning medication (except diabetic medications) with a sip of water prior to arrival.  - You will not be able to drive home after your procedure.    - Do NOT miss any doses of your blood thinner - if you should miss a dose please notify our office immediately.   - If you feel as if you go back into normal rhythm prior to scheduled cardioversion, please notify our office immediately.   If your procedure is canceled in the cardioversion suite you will be charged a cancellation fee.

## 2022-11-02 DIAGNOSIS — E875 Hyperkalemia: Secondary | ICD-10-CM | POA: Diagnosis not present

## 2022-11-02 DIAGNOSIS — E1142 Type 2 diabetes mellitus with diabetic polyneuropathy: Secondary | ICD-10-CM | POA: Diagnosis not present

## 2022-11-02 DIAGNOSIS — Z1321 Encounter for screening for nutritional disorder: Secondary | ICD-10-CM | POA: Diagnosis not present

## 2022-11-02 DIAGNOSIS — R5383 Other fatigue: Secondary | ICD-10-CM | POA: Diagnosis not present

## 2022-11-02 DIAGNOSIS — E1165 Type 2 diabetes mellitus with hyperglycemia: Secondary | ICD-10-CM | POA: Diagnosis not present

## 2022-11-02 DIAGNOSIS — Z1329 Encounter for screening for other suspected endocrine disorder: Secondary | ICD-10-CM | POA: Diagnosis not present

## 2022-11-02 DIAGNOSIS — E7849 Other hyperlipidemia: Secondary | ICD-10-CM | POA: Diagnosis not present

## 2022-11-06 DIAGNOSIS — E8881 Metabolic syndrome: Secondary | ICD-10-CM | POA: Diagnosis not present

## 2022-11-06 DIAGNOSIS — F331 Major depressive disorder, recurrent, moderate: Secondary | ICD-10-CM | POA: Diagnosis not present

## 2022-11-06 DIAGNOSIS — I10 Essential (primary) hypertension: Secondary | ICD-10-CM | POA: Diagnosis not present

## 2022-11-06 DIAGNOSIS — D500 Iron deficiency anemia secondary to blood loss (chronic): Secondary | ICD-10-CM | POA: Diagnosis not present

## 2022-11-06 DIAGNOSIS — I480 Paroxysmal atrial fibrillation: Secondary | ICD-10-CM | POA: Diagnosis not present

## 2022-11-06 DIAGNOSIS — G4733 Obstructive sleep apnea (adult) (pediatric): Secondary | ICD-10-CM | POA: Diagnosis not present

## 2022-11-06 DIAGNOSIS — E7849 Other hyperlipidemia: Secondary | ICD-10-CM | POA: Diagnosis not present

## 2022-11-06 DIAGNOSIS — E1142 Type 2 diabetes mellitus with diabetic polyneuropathy: Secondary | ICD-10-CM | POA: Diagnosis not present

## 2022-11-06 DIAGNOSIS — K580 Irritable bowel syndrome with diarrhea: Secondary | ICD-10-CM | POA: Diagnosis not present

## 2022-11-09 ENCOUNTER — Encounter: Payer: Self-pay | Admitting: Cardiology

## 2022-11-19 NOTE — Progress Notes (Signed)
Left message, Procedure scheduled for 0900, Please arrive at the hospital at 0800, NPO after midnight on Monday, May take meds with sips of water in the AM, please have transportation for home post procedure, and someone to stay with pt for approximately 24 hours after  Pt informed to call [phone] with any questions or if he has missed any doses of his Eliquis (or may call his doctor's office with missed doses of Eliquis)

## 2022-11-20 ENCOUNTER — Ambulatory Visit (HOSPITAL_COMMUNITY)
Admission: RE | Admit: 2022-11-20 | Discharge: 2022-11-20 | Disposition: A | Discharge status: Home / Self Care | Payer: Medicare HMO | Attending: Cardiology | Admitting: Cardiology

## 2022-11-20 ENCOUNTER — Encounter (HOSPITAL_COMMUNITY): Payer: Self-pay | Admitting: Cardiology

## 2022-11-20 ENCOUNTER — Ambulatory Visit (HOSPITAL_COMMUNITY): Payer: Medicare HMO | Admitting: Anesthesiology

## 2022-11-20 ENCOUNTER — Other Ambulatory Visit: Payer: Self-pay

## 2022-11-20 ENCOUNTER — Encounter (HOSPITAL_COMMUNITY)
Admission: RE | Disposition: A | Discharge status: Home / Self Care | Payer: Self-pay | Source: Home / Self Care | Attending: Cardiology

## 2022-11-20 DIAGNOSIS — D6869 Other thrombophilia: Secondary | ICD-10-CM | POA: Insufficient documentation

## 2022-11-20 DIAGNOSIS — I4819 Other persistent atrial fibrillation: Secondary | ICD-10-CM | POA: Diagnosis not present

## 2022-11-20 DIAGNOSIS — I4891 Unspecified atrial fibrillation: Secondary | ICD-10-CM

## 2022-11-20 DIAGNOSIS — Z6834 Body mass index (BMI) 34.0-34.9, adult: Secondary | ICD-10-CM | POA: Diagnosis not present

## 2022-11-20 DIAGNOSIS — Z85038 Personal history of other malignant neoplasm of large intestine: Secondary | ICD-10-CM | POA: Insufficient documentation

## 2022-11-20 DIAGNOSIS — E669 Obesity, unspecified: Secondary | ICD-10-CM | POA: Diagnosis not present

## 2022-11-20 DIAGNOSIS — I10 Essential (primary) hypertension: Secondary | ICD-10-CM | POA: Diagnosis not present

## 2022-11-20 DIAGNOSIS — Z79899 Other long term (current) drug therapy: Secondary | ICD-10-CM | POA: Insufficient documentation

## 2022-11-20 DIAGNOSIS — E785 Hyperlipidemia, unspecified: Secondary | ICD-10-CM | POA: Diagnosis not present

## 2022-11-20 DIAGNOSIS — E119 Type 2 diabetes mellitus without complications: Secondary | ICD-10-CM | POA: Diagnosis not present

## 2022-11-20 DIAGNOSIS — Z7901 Long term (current) use of anticoagulants: Secondary | ICD-10-CM | POA: Insufficient documentation

## 2022-11-20 HISTORY — PX: CARDIOVERSION: SHX1299

## 2022-11-20 LAB — GLUCOSE, CAPILLARY: Glucose-Capillary: 167 mg/dL — ABNORMAL HIGH (ref 70–99)

## 2022-11-20 SURGERY — CARDIOVERSION
Anesthesia: General

## 2022-11-20 MED ORDER — SODIUM CHLORIDE 0.9% FLUSH
INTRAVENOUS | Status: DC | PRN
Start: 2022-11-20 — End: 2022-11-20
  Administered 2022-11-20: 10 mL via INTRAVENOUS

## 2022-11-20 MED ORDER — SODIUM CHLORIDE 0.9 % IV SOLN: INTRAVENOUS | Status: DC

## 2022-11-20 MED ORDER — PROPOFOL 10 MG/ML IV BOLUS
INTRAVENOUS | Status: DC | PRN
  Administered 2022-11-20: 80 mg via INTRAVENOUS

## 2022-11-20 MED ORDER — LIDOCAINE 2% (20 MG/ML) 5 ML SYRINGE
INTRAMUSCULAR | Status: DC | PRN
  Administered 2022-11-20: 20 mg via INTRAVENOUS

## 2022-11-20 SURGICAL SUPPLY — 1 items: PAD DEFIB RADIO PHYSIO CONN (PAD) ×2 IMPLANT

## 2022-11-20 NOTE — Transfer of Care (Signed)
Immediate Anesthesia Transfer of Care Note  Patient: Bradley Rodriguez  Procedure(s) Performed: CARDIOVERSION  Patient Location: Cath Lab  Anesthesia Type:General  Level of Consciousness: awake, alert , oriented, patient cooperative, and responds to stimulation  Airway & Oxygen Therapy: Patient Spontanous Breathing and Patient connected to nasal cannula oxygen  Post-op Assessment: Report given to RN and Post -op Vital signs reviewed and stable  Post vital signs: Reviewed and stable  Last Vitals:  Vitals Value Taken Time  BP 113/81 11/20/22 0855  Temp 98.64F   Pulse 50 11/20/22 0855  Resp 17 11/20/22 0855  SpO2 97 % 11/20/22 0855       Complications: No notable events documented.

## 2022-11-20 NOTE — CV Procedure (Signed)
Electrical Cardioversion Procedure Note Bradley Rodriguez [id number] [birth year]  Procedure: Electrical Cardioversion Indications:  Atrial Fibrillation  Time Out: Verified patient identification, verified procedure,medications/allergies/relevent history reviewed, required imaging and test results available.  Performed  Procedure Details  The patient signed informed consent.   The patient was NPO past midnight. Has had therapeutic anticoagulation with Eliquis greater than 3 weeks. The patient denies any interruption of anticoagulation.  Anesthesia was administered by Dr. Sampson Goon.  Adequate airway was maintained throughout and vital followed per protocol.  He was cardioverted x 1 with 200J of biphasic synchronized energy.  He converted to NSR.  There were no apparent complications.  The patient tolerated the procedure well and had normal neuro status and respiratory status post procedure with vitals stable as recorded elsewhere.     IMPRESSION:  Successful cardioversion of atrial fibrillation   Follow up:  We will arrange follow up with primary cardiologist.  He will continue on current medical therapy.  The patient advised to continue anticoagulation.  Tru Leopard 11/20/2022, 8:57 AM

## 2022-11-20 NOTE — Interval H&P Note (Signed)
History and Physical Interval Note:  11/20/2022 8:21 AM  Bradley Rodriguez  has presented today for surgery, with the diagnosis of AFIB.  The various methods of treatment have been discussed with the patient and family. After consideration of risks, benefits and other options for treatment, the patient has consented to  Procedure(s): CARDIOVERSION (N/A) as a surgical intervention.  The patient's history has been reviewed, patient examined, no change in status, stable for surgery.  I have reviewed the patient's chart and labs.  Questions were answered to the patient's satisfaction.     [name]

## 2022-11-20 NOTE — Anesthesia Postprocedure Evaluation (Signed)
Anesthesia Post Note  Patient: Bradley Rodriguez  Procedure(s) Performed: CARDIOVERSION     Patient location during evaluation: PACU Anesthesia Type: General Level of consciousness: awake and alert Pain management: pain level controlled Vital Signs Assessment: post-procedure vital signs reviewed and stable Respiratory status: spontaneous breathing, nonlabored ventilation, respiratory function stable and patient connected to nasal cannula oxygen Cardiovascular status: blood pressure returned to baseline and stable Postop Assessment: no apparent nausea or vomiting Anesthetic complications: no  No notable events documented.  Last Vitals:  Vitals:   11/20/22 0915 11/20/22 0920  BP: 115/79 112/78  Pulse: (!) 49 (!) 50  Resp: 15 10  Temp:    SpO2: 96% 98%    Last Pain:  Vitals:   11/20/22 0859  TempSrc: Temporal  PainSc: 0-No pain                 Kennieth Rad

## 2022-11-20 NOTE — Anesthesia Preprocedure Evaluation (Signed)
Anesthesia Evaluation  Patient identified by MRN, date of birth, ID band Patient awake    Reviewed: Allergy & Precautions, NPO status , Patient's Chart, lab work & pertinent test results  Airway Mallampati: III  TM Distance: >3 FB Neck ROM: Full    Dental  (+) Dental Advisory Given   Pulmonary neg pulmonary ROS   breath sounds clear to auscultation       Cardiovascular hypertension, + dysrhythmias Atrial Fibrillation  Rhythm:Regular Rate:Normal     Neuro/Psych negative neurological ROS     GI/Hepatic Neg liver ROS,GERD  ,,  Endo/Other  diabetes    Renal/GU Renal disease     Musculoskeletal  (+) Arthritis ,    Abdominal   Peds  Hematology  (+) Blood dyscrasia, anemia   Anesthesia Other Findings   Reproductive/Obstetrics                             Anesthesia Physical Anesthesia Plan  ASA: 2  Anesthesia Plan: General   Post-op Pain Management:    Induction: Intravenous  PONV Risk Score and Plan: 2 and Treatment may vary due to age or medical condition  Airway Management Planned: Mask, Natural Airway and Nasal Cannula  Additional Equipment:   Intra-op Plan:   Post-operative Plan:   Informed Consent: I have reviewed the patients History and Physical, chart, labs and discussed the procedure including the risks, benefits and alternatives for the proposed anesthesia with the patient or authorized representative who has indicated his/her understanding and acceptance.       Plan Discussed with:   Anesthesia Plan Comments:        Anesthesia Quick Evaluation

## 2022-12-04 ENCOUNTER — Ambulatory Visit (HOSPITAL_COMMUNITY): Payer: Medicare HMO | Admitting: Internal Medicine

## 2022-12-04 ENCOUNTER — Ambulatory Visit (HOSPITAL_COMMUNITY)
Admission: RE | Admit: 2022-12-04 | Discharge: 2022-12-04 | Disposition: A | Discharge status: Home / Self Care | Payer: Medicare HMO | Source: Ambulatory Visit | Attending: Internal Medicine | Admitting: Internal Medicine

## 2022-12-04 VITALS — BP 130/70 | HR 71 | Ht >= 80 in | Wt 314.4 lb

## 2022-12-04 DIAGNOSIS — Z7901 Long term (current) use of anticoagulants: Secondary | ICD-10-CM | POA: Insufficient documentation

## 2022-12-04 DIAGNOSIS — Z85038 Personal history of other malignant neoplasm of large intestine: Secondary | ICD-10-CM | POA: Diagnosis not present

## 2022-12-04 DIAGNOSIS — R9431 Abnormal electrocardiogram [ECG] [EKG]: Secondary | ICD-10-CM | POA: Diagnosis not present

## 2022-12-04 DIAGNOSIS — I10 Essential (primary) hypertension: Secondary | ICD-10-CM | POA: Insufficient documentation

## 2022-12-04 DIAGNOSIS — E669 Obesity, unspecified: Secondary | ICD-10-CM | POA: Insufficient documentation

## 2022-12-04 DIAGNOSIS — E119 Type 2 diabetes mellitus without complications: Secondary | ICD-10-CM | POA: Diagnosis not present

## 2022-12-04 DIAGNOSIS — Z6834 Body mass index (BMI) 34.0-34.9, adult: Secondary | ICD-10-CM | POA: Insufficient documentation

## 2022-12-04 DIAGNOSIS — I4819 Other persistent atrial fibrillation: Secondary | ICD-10-CM | POA: Insufficient documentation

## 2022-12-04 DIAGNOSIS — D6869 Other thrombophilia: Secondary | ICD-10-CM | POA: Diagnosis not present

## 2022-12-04 DIAGNOSIS — E785 Hyperlipidemia, unspecified: Secondary | ICD-10-CM | POA: Insufficient documentation

## 2022-12-04 MED ORDER — AMIODARONE HCL 200 MG PO TABS: ORAL_TABLET | ORAL | 0 refills | Status: DC

## 2022-12-04 NOTE — Patient Instructions (Signed)
Stop multaq  Start amiodarone 200mg  twice a day for 1 month then reduce to once a day take with food   If you start having heart rates staying less than 60 let us know as metoprolol may need to be reduced.

## 2022-12-04 NOTE — Progress Notes (Addendum)
Primary Care Physician: Richardean Chimera, MD Primary Cardiologist: Dr Wyline Mood Primary Electrophysiologist: none Referring Physician: [facility]   Bradley Rodriguez is a 62 y.o. male with a history of HTN, HLD, DM, colon cancer, atrial fibrillation who presents for consultation in the [facility]. The patient was initially diagnosed with atrial fibrillation in 1996. He has required several cardioversions over the years. He was seen at the ED 12/11/20 with palpitations. ECG showed rate controlled afib. Patient is currently on Eliquis for a Chadsvasc score of 2.   F/u in afib clinic, s/p successful  TEE guided cardioversion, 01/03/21. He feels improved. He remains in SR.   On follow up 07/23/22, he is currently in rate controlled Afib. Seen by Cardiology on 06/27/22 and noted to be in rate controlled Afib. Per history, not on anticoagulation due to history of GI bleeds. He does not have cardiac awareness at this time.   On follow up 08/07/22, he is currently in NSR. He is s/p successful DCCV on 07/30/22 with conversion to NSR after 2 shocks. Since then, he has not had any episodes of Afib. Remains on Eliquis 5 mg BID without missed doses.   On follow up 10/31/22, he is currently in Afib. He was seen by Cardiology on 9/5 and note to be in rate controlled Afib; patient was asymptomatic at that time per report. He tells me today his primary symptom is feeling tired when in Afib. He is not on anticoagulation due to history of GI bleeds and restarted Eliquis on Thursday evening; took 2 doses of Eliquis on Friday 10/4.   On f/u 12/04/22, he is currently in Afib. He does not have cardiac awareness this time but notes to be tired. No missed doses of Eliquis or Multaq. He did not get phone calls from EP office for appointment to discuss ablation (notes his VCM was full).   Today, he denies symptoms of chest pain, shortness of breath, orthopnea, PND, lower extremity edema, dizziness,  presyncope, syncope, snoring, daytime somnolence, bleeding, or neurologic sequela. The patient is tolerating medications without difficulties and is otherwise without complaint today.    Atrial Fibrillation Risk Factors:  he does not have symptoms or diagnosis of sleep apnea. he does not have a history of rheumatic fever. he does not have a history of alcohol use. The patient does not have a history of early familial atrial fibrillation or other arrhythmias.  he has a BMI of Body mass index is 34.54 kg/m.Marland Kitchen Filed Weights   12/04/22 0857  Weight: (!) 142.6 kg    Family History  Problem Relation Age of Onset   Diabetes Mother    Hypertension Mother    Colon cancer Mother        dx. 60s   Diabetes Father    Colon cancer Father        dx. 60s   Lung cancer Maternal Grandmother        smoked   Bone cancer Maternal Grandfather        dx. 80s   Hypertension Other     Atrial Fibrillation Management history:  Previous antiarrhythmic drugs: flecainide, multaq Previous cardioversions: 2014, 2016, 2017, 2018, 2020, 07/30/22, 11/20/22 Previous ablations: none CHADS2VASC score: 2 Anticoagulation history: Xarelto, Eliquis, warfarin    Past Medical History:  Diagnosis Date   A-fib [facility])    DCCV 2009, 2012, 2014, Nov 2016   Anxiety    Arthritis    Knees, wrist, ankles  Cancer (HCC) 07/2019   Kidney   Colon cancer (HCC)    Diabetes mellitus without complication (HCC)    Dysrhythmia 1996   A- Fib   GERD (gastroesophageal reflux disease)    History of kidney stones    HX: anticoagulation    very remotely and briefly on COumadin aound one of his CV, 2014 on Eliquis had hematuria with renal calculi and stopped   Hyperlipidemia    patient denies this   Hypertension    IBS (irritable bowel syndrome)    Normal cardiac stress test    Normal nuclear stress test , 2001    Current Outpatient Medications  Medication Sig Dispense Refill   amiodarone (PACERONE) 200 MG tablet Take 1  tablet by mouth twice a day for 1 month then reduce to once a day 60 tablet 0   amLODipine (NORVASC) 5 MG tablet Take 1 tablet (5 mg total) by mouth daily. (Patient taking differently: Take 5 mg by mouth every evening.) 90 tablet 2   apixaban (ELIQUIS) 5 MG TABS tablet Take 1 tablet (5 mg total) by mouth 2 (two) times daily. 60 tablet 3   atorvastatin (LIPITOR) 20 MG tablet Take 1 tablet (20 mg total) by mouth every evening. 90 tablet 2   cholecalciferol (VITAMIN D3) 25 MCG (1000 UNIT) tablet Take 1,000 Units by mouth daily.     DULoxetine (CYMBALTA) 60 MG capsule Take 60 mg by mouth in the morning.     iron polysaccharides (NIFEREX) 150 MG capsule Take 150 mg by mouth 2 (two) times daily.     losartan (COZAAR) 100 MG tablet Take 1 tablet (100 mg total) by mouth every evening. 90 tablet 2   MAGNESIUM GLUCONATE PO Take 550 mg by mouth every evening.     metoprolol tartrate (LOPRESSOR) 100 MG tablet Take 1 tablet (100 mg total) by mouth 2 (two) times daily. 180 tablet 2   OVER THE COUNTER MEDICATION Take 1 tablet by mouth 2 (two) times daily. Glucocil OTC supplement for blood sugar     ZINC GLUCONATE PO Take 22 mg by mouth in the morning.     No current facility-administered medications for this encounter.    Allergies  Allergen Reactions   Ace Inhibitors Cough   Lisinopril Cough   Xarelto [Rivaroxaban] Other (See Comments)    Heart out of rhythm    Quinine Derivatives Rash   ROS- All systems are reviewed and negative except as per the HPI above.  Physical Exam: Vitals:   12/04/22 0857  BP: 130/70  Pulse: 71  Weight: (!) 142.6 kg  Height: 6\' 8"  (2.032 m)    GEN- The patient is well appearing, alert and oriented x 3 today.   Neck - no JVD or carotid bruit noted Lungs- Clear to ausculation bilaterally, normal work of breathing Heart- Irregular rate and rhythm, no murmurs, rubs or gallops, PMI not laterally displaced Extremities- no clubbing, cyanosis, or edema Skin - no rash or  ecchymosis noted   Wt Readings from Last 3 Encounters:  12/04/22 (!) 142.6 kg  11/20/22 (!) 138.3 kg  10/31/22 (!) 143 kg    EKG today demonstrates  Vent. rate 71 BPM PR interval * ms QRS duration 94 ms QT/QTcB 414/449 ms P-R-T axes * 71 72 Atrial fibrillation Abnormal ECG When compared with ECG of 20-Nov-2022 08:55, PREVIOUS ECG IS PRESENT  TEE 09/11/18  1. The left ventricle has normal systolic function, with an ejection  fraction of 55-60%. The cavity size was  normal.   2. Left atrial size was Left atrium is enlarged.   3. No evidence of intracardiac thrombus. Normal LA appendage without  thrombus, normal emptying velocity 70cm/s.   4. Mitral valve regurgitation is mild to moderate by color flow Doppler.   5. The aortic valve is tricuspid No stenosis of the aortic valve.   6. The aortic root is normal in size and structure.   Epic records are reviewed at length today  CHA2DS2-VASc Score = 2  The patient's score is based upon: CHF History: 0 HTN History: 1 Diabetes History: 1 Stroke History: 0 Vascular Disease History: 0 Age Score: 0 Gender Score: 0      ASSESSMENT AND PLAN: 1. Persistent Atrial Fibrillation (ICD10:  I48.19) The patient's CHA2DS2-VASc score is 2, indicating a 2.2% annual risk of stroke.   Patient has a long history of afib. Last DCCV was 2020. Successful TEE guided cardioversion 01/03/21. S/p successful TEE/DCCV on 07/30/22. S/p successful DCCV on 11/20/22.  He is currently in Afib.   We discussed rhythm control options since the Multaq is not helping him. He does want to speak with EP and previously by mistake had his VCM full. After discussion of options, he would like to begin amiodarone as a bridge to ablation. Stop Multaq. Begin amiodarone load of 200 BID x 4 weeks then transition to 200 mg once daily.  Continue metoprolol 100 mg BID but observe HR and if becomes symptomatic bradycardia decrease Lopressor to 50 mg BID.    2. Secondary  Hypercoagulable State (ICD10:  D68.69) The patient is at significant risk for stroke/thromboembolism based upon his CHA2DS2-VASc Score of 2.  No missed doses since 10/4. I gave pamphlet on Watchman procedure.   3. Obesity Body mass index is 34.54 kg/m. Lifestyle modification was discussed at length including regular exercise and weight reduction.  4. HTN Stable, no changes today.   Will arrange to speak with EP for the possibility of ablation. F/u 3 weeks with Afib clinic.  [name], PA-C Afib Clinic [facility] [address] [place], [place] [address] [phone]

## 2022-12-19 ENCOUNTER — Other Ambulatory Visit: Payer: Self-pay | Admitting: Nurse Practitioner

## 2022-12-25 ENCOUNTER — Encounter: Payer: Self-pay | Admitting: Cardiology

## 2022-12-25 ENCOUNTER — Ambulatory Visit (HOSPITAL_COMMUNITY): Payer: Medicare HMO | Admitting: Internal Medicine

## 2022-12-25 ENCOUNTER — Ambulatory Visit: Payer: Medicare HMO | Attending: Cardiology | Admitting: Cardiology

## 2022-12-25 VITALS — BP 124/88 | HR 94 | Ht 79.5 in | Wt 313.0 lb

## 2022-12-25 DIAGNOSIS — D509 Iron deficiency anemia, unspecified: Secondary | ICD-10-CM

## 2022-12-25 DIAGNOSIS — D6869 Other thrombophilia: Secondary | ICD-10-CM

## 2022-12-25 DIAGNOSIS — I4819 Other persistent atrial fibrillation: Secondary | ICD-10-CM | POA: Diagnosis not present

## 2022-12-25 DIAGNOSIS — I10 Essential (primary) hypertension: Secondary | ICD-10-CM | POA: Diagnosis not present

## 2022-12-25 DIAGNOSIS — Z8719 Personal history of other diseases of the digestive system: Secondary | ICD-10-CM

## 2022-12-25 NOTE — Progress Notes (Addendum)
Electrophysiology Office Note:   Date:  12/25/2022  ID:  Bradley Rodriguez, DOB Nov 20, 1960, MRN [id number]  Primary Cardiologist: Dina Rich, MD Electrophysiologist: Nobie Putnam, MD      History of Present Illness:   Bradley Rodriguez is a 62 y.o. male with h/o HTN, HLD, DM, colon cancer, atrial fibrillation who is being seen today for evaluation of his atrial fibrillation at the request of Bradley Bells, Rodriguez.   The patient was initially diagnosed with atrial fibrillation in 1996. He has required several cardioversions over the years. He was seen at the ED 12/11/20 with palpitations. ECG showed rate controlled afib. He underwent successful cardioversion 01/03/21. He was then seen by Cardiology in June of 2024 and was found to be in rate controlled AF at that time. He had a successful cardioversion on 08/07/22, but when he returned to Cardiology clinic on 09/27/22 he was back in atrial fibrillation. He was started on Multaq on 10/31/22 and repeat cardioversion was done on 11/20/22 with successful conversion to sinus rhythm. He returned to AF Clinic on 12/04/22 and again was back in atrial fibrillation so Multaq was stopped and amiodarone was started.   Review of systems complete and found to be negative unless listed in HPI.   EP Information / Studies Reviewed:    EKG is not ordered today. EKG from 12/04/22 reviewed which showed atrial fibrillation.      TEE 07/30/22: IMPRESSIONS   1. Left ventricular ejection fraction, by estimation, is 50 to 55%. The  left ventricle has low normal function. The left ventricle has no regional  wall motion abnormalities.   2. Right ventricular systolic function is normal. The right ventricular  size is normal.   3. Left atrial size was severely dilated. No left atrial/left atrial  appendage thrombus was detected. The LAA emptying velocity was 24 cm/s.   4. Right atrial size was severely dilated.   5. There is very small segment of flail motion of the P3  (medial) scallop  of the posterior mitral valve. The mitral valve is abnormal. Mild to  moderate mitral valve regurgitation. No evidence of mitral stenosis.   6. The aortic valve is tricuspid. Aortic valve regurgitation is trivial.  No aortic stenosis is present.   Risk Assessment/Calculations:    CHA2DS2-VASc Score = 2   This indicates a 2.2% annual risk of stroke. The patient's score is based upon: CHF History: 0 HTN History: 1 Diabetes History: 1 Stroke History: 0 Vascular Disease History: 0 Age Score: 0 Gender Score: 0             Physical Exam:   VS:  BP 124/88   Pulse 94   Ht 6' 7.5" (2.019 m)   Wt (!) 313 lb (142 kg)   SpO2 96%   BMI 34.82 kg/m    Wt Readings from Last 3 Encounters:  12/25/22 (!) 313 lb (142 kg)  12/04/22 (!) 314 lb 6.4 oz (142.6 kg)  11/20/22 (!) 305 lb (138.3 kg)     GEN: Well nourished, well developed in no acute distress NECK: No JVD; No carotid bruits CARDIAC: Normal rate, irregular rhythm.  RESPIRATORY:  Clear to auscultation without rales, wheezing or rhonchi  ABDOMEN: Soft, non-tender, non-distended EXTREMITIES:  No edema; No deformity   ASSESSMENT AND PLAN:   Bradley Rodriguez is a 62 y.o. male with h/o HTN, HLD, DM, colon cancer, atrial fibrillation who is being seen today for evaluation of his atrial fibrillation at the request of Bradley Longs  Nelva Rodriguez, Rodriguez.   #. Persistent atrial fibrillation: Symptomatic. Failed Multaq. Started on amiodarone, still in AF with controlled rates. - Continue amiodarone. Will repeat cardioversion in 1 week. Has not had DCCV since starting amiodarone a month ago. - Continue Eliquis.  - Discussed treatment options today for AF including antiarrhythmic drug therapy and ablation. Discussed risks, recovery and likelihood of success with each treatment strategy. Risk, benefits, and alternatives to EP study and ablation for afib were discussed. These risks include but are not limited to stroke, bleeding, vascular damage,  tamponade, perforation, damage to the esophagus, lungs, phrenic nerve and other structures, pulmonary vein stenosis, worsening renal function, coronary vasospasm and death.  Discussed potential need for repeat ablation procedures and antiarrhythmic drugs after an initial ablation. The patient understands these risk and wishes to proceed.  We will therefore proceed with catheter ablation at the next available time.  Carto, ICE, anesthesia are requested for the procedure.  Will also obtain CT PV protocol prior to the procedure to exclude LAA thrombus and further evaluate atrial anatomy.  #. 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 History of colon cancer with GI bleeding #. Chronic iron deficiency anemia - Patient has previously been off oral-anticoagulation for many years despite his AF diagnosis. He would only take oral anti-coagulation around the time of cardioversion. I believe the patient will benefit from Watchman device given their history of atrial fibrillation, CHA2DS2-VASc score of 2 and unadjusted ischemic stroke rate of 2.2% per year. Unfortunately, the patient is not felt to be a long term anticoagulation candidate secondary to history of GI bleeding. The patient's chart has been reviewed and I feel that they would be a candidate for short term oral anticoagulation after Watchman implant.   It is my belief that after undergoing a LAA closure procedure, Bradley Rodriguez will not need long term anticoagulation which eliminates anticoagulation side effects and major bleeding risk.   Procedural risks for the Watchman implant have been reviewed with the patient including a 0.5% risk of stroke, <1% risk of perforation and <1% risk of device embolization. Other risks include bleeding, vascular damage, tamponade, worsening renal function, and death. The patient understands these risk and wishes to proceed.     The published clinical data on the safety and effectiveness of  WATCHMAN include but are not limited to the following: - Holmes DR, Everlene Farrier, Sick P et al. for the PROTECT AF Investigators. Percutaneous closure of the left atrial appendage versus warfarin therapy for prevention of stroke in patients with atrial fibrillation: a randomised non-inferiority trial. Lancet 2009; 374: 534-42. Everlene Farrier, Doshi SK, Isa Rankin D et al. on behalf of the PROTECT AF Investigators. Percutaneous Left Atrial Appendage Closure for Stroke Prophylaxis in Patients With Atrial Fibrillation 2.3-Year Follow-up of the PROTECT AF (Watchman Left Atrial Appendage System for Embolic Protection in Patients With Atrial Fibrillation) Trial. Circulation 2013; 127:720-729. - Alli O, Doshi S,  Kar S, Reddy VY, Sievert H et al. Quality of Life Assessment in the Randomized PROTECT AF (Percutaneous Closure of the Left Atrial Appendage Versus Warfarin Therapy for Prevention of Stroke in Patients With Atrial Fibrillation) Trial of Patients at Risk for Stroke With Nonvalvular Atrial Fibrillation. J Am Coll Cardiol 2013; 61:1790-8. Aline August DR, Mia [place], Price M, Whisenant B, Sievert H, Doshi S, Huber K, Reddy V. Prospective randomized evaluation of the Watchman left atrial appendage Device in patients with atrial fibrillation versus long-term warfarin therapy; the PREVAIL trial. Journal of  the [employer] of Cardiology, Vol. 4, No. 1, 2014, 1-11. - Kar S, Doshi SK, Sadhu A, Horton R, Osorio J et al. Primary outcome evaluation of a next-generation left atrial appendage closure device: results from the PINNACLE FLX trial. Circulation 2021;143(18)1754-1762.   HAS-BLED score: 2 Hypertension Yes  Abnormal renal and liver function (Dialysis, transplant, Cr >2.26 mg/dL /Cirrhosis or Bilirubin >2x Normal or AST/ALT/AP >3x Normal) No  Stroke No  Bleeding Yes  Labile INR (Unstable/high INR) No  Elderly (>65) No  Drugs or alcohol (>= 8 drinks/week, anti-plt or NSAID) No   After today's visit with the  patient which was dedicated solely for shared decision making visit regarding LAA closure device, the patient decided to proceed with the LAA appendage closure procedure scheduled to be done concomitantly with AF ablation at [facility]. Prior to the procedure, I would like to obtain a gated CT scan of the chest with contrast timed for PV/LA visualization.   #Hypertension At goal today.  Recommend checking blood pressures 1-2 times per week at home and recording the values.  Recommend bringing these recordings to the primary care physician.  Signed, Nobie Putnam, MD

## 2022-12-25 NOTE — H&P (View-Only) (Signed)
 Electrophysiology Office Note:   Date:  12/25/2022  ID:  Bradley Rodriguez, DOB 1960-06-02, MRN [id number]  Primary Cardiologist: Dina Rich, MD Electrophysiologist: Nobie Putnam, MD      History of Present Illness:   Bradley Rodriguez is a 62 y.o. male with h/o HTN, HLD, DM, colon cancer, atrial fibrillation who is being seen today for evaluation of his atrial fibrillation at the request of [name], [place].   The patient was initially diagnosed with atrial fibrillation in 1996. He has required several cardioversions over the years. He was seen at the ED 12/11/20 with palpitations. ECG showed rate controlled afib. He underwent successful cardioversion 01/03/21. He was then seen by Cardiology in June of 2024 and was found to be in rate controlled AF at that time. He had a successful cardioversion on 08/07/22, but when he returned to Cardiology clinic on 09/27/22 he was back in atrial fibrillation. He was started on Multaq on 10/31/22 and repeat cardioversion was done on 11/20/22 with successful conversion to sinus rhythm. He returned to AF Clinic on 12/04/22 and again was back in atrial fibrillation so Multaq was stopped and amiodarone was started.   Review of systems complete and found to be negative unless listed in HPI.   EP Information / Studies Reviewed:    EKG is not ordered today. EKG from 12/04/22 reviewed which showed atrial fibrillation.      TEE 07/30/22: IMPRESSIONS   1. Left ventricular ejection fraction, by estimation, is 50 to 55%. The  left ventricle has low normal function. The left ventricle has no regional  wall motion abnormalities.   2. Right ventricular systolic function is normal. The right ventricular  size is normal.   3. Left atrial size was severely dilated. No left atrial/left atrial  appendage thrombus was detected. The LAA emptying velocity was 24 cm/s.   4. Right atrial size was severely dilated.   5. There is very small segment of flail motion of the P3  (medial) scallop  of the posterior mitral valve. The mitral valve is abnormal. Mild to  moderate mitral valve regurgitation. No evidence of mitral stenosis.   6. The aortic valve is tricuspid. Aortic valve regurgitation is trivial.  No aortic stenosis is present.   Risk Assessment/Calculations:    CHA2DS2-VASc Score = 2   This indicates a 2.2% annual risk of stroke. The patient's score is based upon: CHF History: 0 HTN History: 1 Diabetes History: 1 Stroke History: 0 Vascular Disease History: 0 Age Score: 0 Gender Score: 0             Physical Exam:   VS:  BP 124/88   Pulse 94   Ht 6' 7.5" (2.019 m)   Wt (!) 313 lb (142 kg)   SpO2 96%   BMI 34.82 kg/m    Wt Readings from Last 3 Encounters:  12/25/22 (!) 313 lb (142 kg)  12/04/22 (!) 314 lb 6.4 oz (142.6 kg)  11/20/22 (!) 305 lb (138.3 kg)     GEN: Well nourished, well developed in no acute distress NECK: No JVD; No carotid bruits CARDIAC: Normal rate, irregular rhythm.  RESPIRATORY:  Clear to auscultation without rales, wheezing or rhonchi  ABDOMEN: Soft, non-tender, non-distended EXTREMITIES:  No edema; No deformity   ASSESSMENT AND PLAN:   Bradley Rodriguez is a 62 y.o. male with h/o HTN, HLD, DM, colon cancer, atrial fibrillation who is being seen today for evaluation of his atrial fibrillation at the request of Jomarie Longs  [place], [place].   #. Persistent atrial fibrillation: Symptomatic. Failed Multaq. Started on amiodarone, still in AF with controlled rates. - Continue amiodarone. Will repeat cardioversion in 1 week. Has not had DCCV since starting amiodarone a month ago. - Continue Eliquis.  - Discussed treatment options today for AF including antiarrhythmic drug therapy and ablation. Discussed risks, recovery and likelihood of success with each treatment strategy. Risk, benefits, and alternatives to EP study and ablation for afib were discussed. These risks include but are not limited to stroke, bleeding, vascular damage,  tamponade, perforation, damage to the esophagus, lungs, phrenic nerve and other structures, pulmonary vein stenosis, worsening renal function, coronary vasospasm and death.  Discussed potential need for repeat ablation procedures and antiarrhythmic drugs after an initial ablation. The patient understands these risk and wishes to proceed.  We will therefore proceed with catheter ablation at the next available time.  Carto, ICE, anesthesia are requested for the procedure.  Will also obtain CT PV protocol prior to the procedure to exclude LAA thrombus and further evaluate atrial anatomy.  #. 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 History of colon cancer with GI bleeding #. Chronic iron deficiency anemia - Patient has previously been off oral-anticoagulation for many years despite his AF diagnosis. He would only take oral anti-coagulation around the time of cardioversion. I believe the patient will benefit from Watchman device given their history of atrial fibrillation, CHA2DS2-VASc score of 2 and unadjusted ischemic stroke rate of 2.2% per year. Unfortunately, the patient is not felt to be a long term anticoagulation candidate secondary to history of GI bleeding. The patient's chart has been reviewed and I feel that they would be a candidate for short term oral anticoagulation after Watchman implant.   It is my belief that after undergoing a LAA closure procedure, Bradley Rodriguez will not need long term anticoagulation which eliminates anticoagulation side effects and major bleeding risk.   Procedural risks for the Watchman implant have been reviewed with the patient including a 0.5% risk of stroke, <1% risk of perforation and <1% risk of device embolization. Other risks include bleeding, vascular damage, tamponade, worsening renal function, and death. The patient understands these risk and wishes to proceed.     The published clinical data on the safety and effectiveness of  WATCHMAN include but are not limited to the following: - Holmes DR, Everlene Farrier, Sick P et al. for the PROTECT AF Investigators. Percutaneous closure of the left atrial appendage versus warfarin therapy for prevention of stroke in patients with atrial fibrillation: a randomised non-inferiority trial. Lancet 2009; 374: 534-42. Everlene Farrier, Doshi SK, Isa Rankin D et al. on behalf of the PROTECT AF Investigators. Percutaneous Left Atrial Appendage Closure for Stroke Prophylaxis in Patients With Atrial Fibrillation 2.3-Year Follow-up of the PROTECT AF (Watchman Left Atrial Appendage System for Embolic Protection in Patients With Atrial Fibrillation) Trial. Circulation 2013; 127:720-729. - Alli O, Doshi S,  Kar S, Reddy VY, Sievert H et al. Quality of Life Assessment in the Randomized PROTECT AF (Percutaneous Closure of the Left Atrial Appendage Versus Warfarin Therapy for Prevention of Stroke in Patients With Atrial Fibrillation) Trial of Patients at Risk for Stroke With Nonvalvular Atrial Fibrillation. J Am Coll Cardiol 2013; 61:1790-8. Aline August DR, Mia [place], Price M, Whisenant B, Sievert H, Doshi S, Huber K, Reddy V. Prospective randomized evaluation of the Watchman left atrial appendage Device in patients with atrial fibrillation versus long-term warfarin therapy; the PREVAIL trial. Journal of  the [employer] of Cardiology, Vol. 4, No. 1, 2014, 1-11. - Kar S, Doshi SK, Sadhu A, Horton R, Osorio J et al. Primary outcome evaluation of a next-generation left atrial appendage closure device: results from the PINNACLE FLX trial. Circulation 2021;143(18)1754-1762.    After today's visit with the patient which was dedicated solely for shared decision making visit regarding LAA closure device, the patient decided to proceed with the LAA appendage closure procedure scheduled to be done concomitantly with AF ablation at [facility]. Prior to the procedure, I would like to obtain a gated CT scan of the  chest with contrast timed for PV/LA visualization.   #Hypertension At goal today.  Recommend checking blood pressures 1-2 times per week at home and recording the values.  Recommend bringing these recordings to the primary care physician.   Signed, Nobie Putnam, MD

## 2022-12-25 NOTE — Patient Instructions (Signed)
Medication Instructions:  Your physician recommends that you continue on your current medications as directed. Please refer to the Current Medication list given to you today.  *If you need a refill on your cardiac medications before your next appointment, please call your pharmacy*  Testing/Procedures: Your physician has requested that you have cardiac CT. Cardiac computed tomography (CT) is a painless test that uses an x-ray machine to take clear, detailed pictures of your heart. For further information please visit [email or web address] Please follow instruction sheet as given. We will call you to schedule your CT scan. It will be done about three weeks prior to your ablation/watchman.   Watchman:  Your physician has requested that you have Left atrial appendage (LAA) closure device implantation is a procedure to put a small device in the LAA of the heart. The LAA is a small sac in the wall of the heart's left upper chamber. Blood clots can form in this area. The device, Watchman closes the LAA to help prevent a blood clot and stroke.   Ablation: Your physician has recommended that you have an ablation. Catheter ablation is a medical procedure used to treat some cardiac arrhythmias (irregular heartbeats). During catheter ablation, a long, thin, flexible tube is put into a blood vessel in your groin (upper thigh), or neck. This tube is called an ablation catheter. It is then guided to your heart through the blood vessel. Radio frequency waves destroy small areas of heart tissue where abnormal heartbeats may cause an arrhythmia to start. Please see the instruction sheet given to you today.  You are scheduled for Atrial Fibrillation Ablation and Watchman Implant on Monday, January 20 with Dr. Michele Rockers. We will be in touch with you about instructions and time.   Follow-Up: At [facility], you and your health needs are our priority.  As part of our continuing mission to provide you with  exceptional heart care, we have created designated Provider Care Teams.  These Care Teams include your primary Cardiologist (physician) and Advanced Practice Providers (APPs -  Physician Assistants and Nurse Practitioners) who all work together to provide you with the care you need, when you need it.    Your next appointment:   We will call you to arrange your follow up appointments.

## 2023-01-02 ENCOUNTER — Other Ambulatory Visit (HOSPITAL_COMMUNITY): Payer: Self-pay | Admitting: *Deleted

## 2023-01-02 MED ORDER — AMIODARONE HCL 200 MG PO TABS: 200.0000 mg | ORAL_TABLET | Freq: Every day | ORAL | 1 refills | Status: DC

## 2023-01-03 NOTE — Progress Notes (Signed)
Left message for return call for pre op instructions.

## 2023-01-04 ENCOUNTER — Ambulatory Visit (HOSPITAL_COMMUNITY): Payer: Medicare HMO | Admitting: Anesthesiology

## 2023-01-04 ENCOUNTER — Encounter (HOSPITAL_COMMUNITY)
Admission: RE | Disposition: A | Discharge status: Home / Self Care | Payer: Self-pay | Source: Home / Self Care | Attending: Cardiovascular Disease

## 2023-01-04 ENCOUNTER — Ambulatory Visit (HOSPITAL_COMMUNITY)
Admission: RE | Admit: 2023-01-04 | Discharge: 2023-01-04 | Disposition: A | Discharge status: Home / Self Care | Payer: Medicare HMO | Attending: Cardiovascular Disease | Admitting: Cardiovascular Disease

## 2023-01-04 ENCOUNTER — Other Ambulatory Visit: Payer: Self-pay

## 2023-01-04 DIAGNOSIS — D509 Iron deficiency anemia, unspecified: Secondary | ICD-10-CM

## 2023-01-04 DIAGNOSIS — I10 Essential (primary) hypertension: Secondary | ICD-10-CM | POA: Insufficient documentation

## 2023-01-04 DIAGNOSIS — I4891 Unspecified atrial fibrillation: Secondary | ICD-10-CM | POA: Diagnosis not present

## 2023-01-04 DIAGNOSIS — E785 Hyperlipidemia, unspecified: Secondary | ICD-10-CM | POA: Diagnosis not present

## 2023-01-04 DIAGNOSIS — I4819 Other persistent atrial fibrillation: Secondary | ICD-10-CM

## 2023-01-04 DIAGNOSIS — Z79899 Other long term (current) drug therapy: Secondary | ICD-10-CM | POA: Diagnosis not present

## 2023-01-04 DIAGNOSIS — Z7901 Long term (current) use of anticoagulants: Secondary | ICD-10-CM | POA: Insufficient documentation

## 2023-01-04 DIAGNOSIS — Z01818 Encounter for other preprocedural examination: Secondary | ICD-10-CM

## 2023-01-04 DIAGNOSIS — E119 Type 2 diabetes mellitus without complications: Secondary | ICD-10-CM | POA: Diagnosis not present

## 2023-01-04 DIAGNOSIS — Z85038 Personal history of other malignant neoplasm of large intestine: Secondary | ICD-10-CM | POA: Insufficient documentation

## 2023-01-04 DIAGNOSIS — D6869 Other thrombophilia: Secondary | ICD-10-CM

## 2023-01-04 DIAGNOSIS — Z8719 Personal history of other diseases of the digestive system: Secondary | ICD-10-CM

## 2023-01-04 HISTORY — PX: CARDIOVERSION: EP1203

## 2023-01-04 LAB — POCT I-STAT, CHEM 8
BUN: 22 mg/dL (ref 8–23)
Calcium, Ion: 1.2 mmol/L (ref 1.15–1.40)
Chloride: 104 mmol/L (ref 98–111)
Creatinine, Ser: 1.4 mg/dL — ABNORMAL HIGH (ref 0.61–1.24)
Glucose, Bld: 154 mg/dL — ABNORMAL HIGH (ref 70–99)
HCT: 43 % (ref 39.0–52.0)
Hemoglobin: 14.6 g/dL (ref 13.0–17.0)
Potassium: 4.5 mmol/L (ref 3.5–5.1)
Sodium: 139 mmol/L (ref 135–145)
TCO2: 23 mmol/L (ref 22–32)

## 2023-01-04 SURGERY — CARDIOVERSION (CATH LAB)
Anesthesia: Monitor Anesthesia Care

## 2023-01-04 MED ORDER — PROPOFOL 10 MG/ML IV BOLUS
INTRAVENOUS | Status: DC | PRN
  Administered 2023-01-04 (×2): 20 mg via INTRAVENOUS
  Administered 2023-01-04: 60 mg via INTRAVENOUS
  Administered 2023-01-04: 20 mg via INTRAVENOUS

## 2023-01-04 MED ORDER — LIDOCAINE 2% (20 MG/ML) 5 ML SYRINGE
INTRAMUSCULAR | Status: DC | PRN
  Administered 2023-01-04: 20 mg via INTRAVENOUS

## 2023-01-04 MED ORDER — SODIUM CHLORIDE 0.9 % IV SOLN
INTRAVENOUS | Status: DC
Start: 2023-01-04 — End: 2023-01-04

## 2023-01-04 SURGICAL SUPPLY — 1 items: PAD DEFIB RADIO PHYSIO CONN (PAD) ×2 IMPLANT

## 2023-01-04 NOTE — Interval H&P Note (Signed)
History and Physical Interval Note:  01/04/2023 8:35 AM  Bradley Rodriguez  has presented today for surgery, with the diagnosis of AFIB.  The various methods of treatment have been discussed with the patient and family. After consideration of risks, benefits and other options for treatment, the patient has consented to  Procedure(s): CARDIOVERSION (N/A) as a surgical intervention.  The patient's history has been reviewed, patient examined, no change in status, stable for surgery.  I have reviewed the patient's chart and labs.  Questions were answered to the patient's satisfaction.     Charlton Haws

## 2023-01-04 NOTE — Anesthesia Preprocedure Evaluation (Addendum)
Anesthesia Evaluation  Patient identified by MRN, date of birth, ID band Patient awake    Reviewed: Allergy & Precautions, NPO status , Patient's Chart, lab work & pertinent test results  Airway Mallampati: II  TM Distance: >3 FB Neck ROM: Full    Dental  (+) Chipped, Missing,    Pulmonary neg pulmonary ROS   Pulmonary exam normal        Cardiovascular hypertension, Pt. on medications and Pt. on home beta blockers Normal cardiovascular exam+ dysrhythmias Atrial Fibrillation      Neuro/Psych   Anxiety     negative neurological ROS     GI/Hepatic negative GI ROS, Neg liver ROS,,,  Endo/Other  diabetes    Renal/GU negative Renal ROS     Musculoskeletal  (+) Arthritis ,    Abdominal  (+) + obese  Peds  Hematology  (+) Blood dyscrasia (Eliquis)   Anesthesia Other Findings A-FIB  Reproductive/Obstetrics                             Anesthesia Physical Anesthesia Plan  ASA: 3  Anesthesia Plan: MAC   Post-op Pain Management:    Induction:   PONV Risk Score and Plan: 1 and Propofol infusion and Treatment may vary due to age or medical condition  Airway Management Planned: Nasal Cannula  Additional Equipment:   Intra-op Plan:   Post-operative Plan:   Informed Consent: I have reviewed the patients History and Physical, chart, labs and discussed the procedure including the risks, benefits and alternatives for the proposed anesthesia with the patient or authorized representative who has indicated his/her understanding and acceptance.     Dental advisory given  Plan Discussed with: CRNA  Anesthesia Plan Comments:        Anesthesia Quick Evaluation

## 2023-01-04 NOTE — Telephone Encounter (Signed)
Spoke with the patient at length. Scheduled him for PVI/LAAO on 02/21/2023. He will have both pre-procedure CT and visit on 02/01/2023 to avoid multiple trips to [place]. He was grateful for call and agreed with plan.

## 2023-01-04 NOTE — CV Procedure (Signed)
DCC: On Rx eliquis no missed doses Anesthesia: Propofol  DCC x 3 250->275->300J biphasic  Failed to convert with any shock  No immediate neurologic sequelae  Ablation scheduled already  Charlton Haws MD [facility]

## 2023-01-04 NOTE — Anesthesia Postprocedure Evaluation (Signed)
Anesthesia Post Note  Patient: Bradley Rodriguez  Procedure(s) Performed: CARDIOVERSION     Patient location during evaluation: Cath Lab Anesthesia Type: MAC Level of consciousness: awake Pain management: pain level controlled Vital Signs Assessment: post-procedure vital signs reviewed and stable Respiratory status: spontaneous breathing, nonlabored ventilation and respiratory function stable Cardiovascular status: blood pressure returned to baseline and stable Postop Assessment: no apparent nausea or vomiting Anesthetic complications: no   No notable events documented.  Last Vitals:  Vitals:   01/04/23 0930 01/04/23 0938  BP: 127/68 136/78  Pulse:    Resp:    Temp:    SpO2:      Last Pain:  Vitals:   01/04/23 0914  TempSrc: Temporal  PainSc:                  Catheryn Bacon Mann [place]

## 2023-01-04 NOTE — Transfer of Care (Signed)
Immediate Anesthesia Transfer of Care Note  Patient: Bradley Rodriguez  Procedure(s) Performed: CARDIOVERSION  Patient Location: Cath Lab  Anesthesia Type:General  Level of Consciousness: drowsy  Airway & Oxygen Therapy: Patient Spontanous Breathing and Patient connected to nasal cannula oxygen  Post-op Assessment: Report given to RN and Post -op Vital signs reviewed and stable  Post vital signs: Reviewed and stable  Last Vitals:  Vitals Value Taken Time  BP 134/81   Temp    Pulse 76   Resp 14   SpO2 97     Last Pain:  Vitals:   01/04/23 0816  TempSrc: Temporal  PainSc: 0-No pain         Complications: No notable events documented.

## 2023-01-04 NOTE — Discharge Instructions (Signed)

## 2023-01-07 ENCOUNTER — Encounter (HOSPITAL_COMMUNITY): Payer: Self-pay | Admitting: Cardiovascular Disease

## 2023-01-10 ENCOUNTER — Telehealth: Payer: Self-pay

## 2023-02-01 ENCOUNTER — Ambulatory Visit (HOSPITAL_COMMUNITY)
Admission: RE | Admit: 2023-02-01 | Discharge: 2023-02-01 | Disposition: A | Discharge status: Home / Self Care | Payer: Medicare HMO | Source: Ambulatory Visit | Attending: Family Medicine | Admitting: Family Medicine

## 2023-02-01 ENCOUNTER — Ambulatory Visit: Payer: Medicare HMO

## 2023-02-01 VITALS — BP 136/88 | HR 85 | Ht 79.5 in | Wt 322.2 lb

## 2023-02-01 DIAGNOSIS — I10 Essential (primary) hypertension: Secondary | ICD-10-CM

## 2023-02-01 DIAGNOSIS — I4819 Other persistent atrial fibrillation: Secondary | ICD-10-CM

## 2023-02-01 DIAGNOSIS — Z8719 Personal history of other diseases of the digestive system: Secondary | ICD-10-CM

## 2023-02-01 MED ORDER — IOHEXOL 350 MG/ML SOLN
95.0000 mL | Freq: Once | INTRAVENOUS | Status: AC | PRN
  Administered 2023-02-01: 95 mL via INTRAVENOUS

## 2023-02-01 MED ORDER — METOPROLOL TARTRATE 5 MG/5ML IV SOLN
INTRAVENOUS | Status: AC
  Filled 2023-02-01: qty 15

## 2023-02-01 MED ORDER — NITROGLYCERIN 0.4 MG SL SUBL: 0.8000 mg | SUBLINGUAL_TABLET | Freq: Once | SUBLINGUAL | Status: DC

## 2023-02-01 MED ORDER — DILTIAZEM HCL 25 MG/5ML IV SOLN
10.0000 mg | INTRAVENOUS | Status: DC | PRN
  Administered 2023-02-01: 10 mg via INTRAVENOUS

## 2023-02-01 MED ORDER — DILTIAZEM HCL 25 MG/5ML IV SOLN
INTRAVENOUS | Status: AC
  Filled 2023-02-01: qty 5

## 2023-02-01 MED ORDER — METOPROLOL TARTRATE 5 MG/5ML IV SOLN
10.0000 mg | Freq: Once | INTRAVENOUS | Status: AC | PRN
  Administered 2023-02-01: 10 mg via INTRAVENOUS

## 2023-02-01 NOTE — Progress Notes (Addendum)
 [facility]   MULTIDISCIPLINARY HEART CLINIC                                     Cardiology Office Note:    Date:  02/04/2023   ID:  Bradley Rodriguez, DOB 24-Mar-1960, MRN [id number]  PCP:  Toribio Jerel MATSU, MD  [facility] Cardiologist:  Alvan Carrier, MD  [facility] Electrophysiologist:  Fonda Kitty, MD   Referring MD: Toribio Jerel MATSU, MD   Pre Ablation/LAAO visit- scheduled for 02/21/23  History of Present Illness:    Bradley Rodriguez is a 63 y.o. male with a hx of morbid obesity, HTN, HLD, DMT2, colon cancer with history of GI bleeding, and persistent atrial fibrillation who presents to clinic to discuss upcoming concomitant atrial fib ablation and Watchman.  The patient was initially diagnosed with atrial fibrillation in 1996. He has required several cardioversions over the years. He also failed Multaq  and amiodarone . Unfortunately, the patient is not felt to be a long term anticoagulation candidate secondary to history of GI bleeding.   He was seen by Dr. Kitty on 12/25/22 and set up for concomitant afib ablation and LAAO. Pre Watchman CT was completed today and showed a large chicken wing appendage suitable for a 31 mm Watchman FLX, small PFO and moderately elevated CAC score of 253 (73% for age/sex/race matched controls.)  Today he presents to clinic for follow up. Only symptom he gets from his afib is fatigue which as been ongoing. He stopped amiodarone  since it wasn't working. No CP or SOB. No LE edema, orthopnea or PND. No dizziness or syncope. No blood in stool or urine. No palpitations.    Past Medical History:  Diagnosis Date   A-fib [facility])    DCCV 2009, 2012, 2014, Nov 2016   Anxiety    Arthritis    Knees, wrist, ankles   Cancer (HCC) 07/2019   Kidney   Colon cancer (HCC)    Diabetes mellitus without complication (HCC)    Dysrhythmia 1996   A- Fib   GERD (gastroesophageal reflux disease)    History of kidney stones    HX: anticoagulation     very remotely and briefly on COumadin  aound one of his CV, 2014 on Eliquis  had hematuria with renal calculi and stopped   Hyperlipidemia    patient denies this   Hypertension    IBS (irritable bowel syndrome)    Normal cardiac stress test    Normal nuclear stress test , 2001     Current Medications: Current Meds  Medication Sig   amLODipine  (NORVASC ) 5 MG tablet Take 1 tablet (5 mg total) by mouth daily. (Patient taking differently: Take 5 mg by mouth every evening.)   apixaban  (ELIQUIS ) 5 MG TABS tablet Take 1 tablet (5 mg total) by mouth 2 (two) times daily.   Cholecalciferol  (VITAMIN D ) 50 MCG (2000 UT) tablet Take 2,000 Units by mouth daily.   DULoxetine  (CYMBALTA ) 60 MG capsule Take 60 mg by mouth in the morning.   iron  polysaccharides (NIFEREX) 150 MG capsule Take 150 mg by mouth 2 (two) times daily.   MAGNESIUM GLUCONATE PO Take 550 mg by mouth every evening.   metoprolol  tartrate (LOPRESSOR ) 100 MG tablet TAKE ONE TABLET BY MOUTH TWICE DAILY   OVER THE COUNTER MEDICATION Take 1 tablet by mouth 2 (two) times daily. Glucocil OTC supplement for blood sugar   ZINC  GLUCONATE PO Take 22 mg by mouth in the morning.      ROS:   Please see the history of present illness.    All other systems reviewed and are negative.  EKGs   EKG Interpretation Date/Time:  Friday February 01 2023 10:55:10 EST Ventricular Rate:  101 PR Interval:    QRS Duration:  100 QT Interval:  368 QTC Calculation: 477 R Axis:   58  Text Interpretation: Atrial fibrillation with rapid ventricular response Confirmed by Sebastian Collar [phone]) on 02/01/2023 11:09:09 AM   Risk Assessment/Calculations:    CHA2DS2-VASc Score = 2   This indicates a 2.2% annual risk of stroke. The patient's score is based upon: CHF History: 0 HTN History: 1 Diabetes History: 1 Stroke History: 0 Vascular Disease History: 0 Age Score: 0 Gender Score: 0        HAS-BLED score: 2 Hypertension Yes  Abnormal renal and  liver function (Dialysis, transplant, Cr >2.26 mg/dL /Cirrhosis or Bilirubin >2x Normal or AST/ALT/AP >3x Normal) No  Stroke No  Bleeding Yes  Labile INR (Unstable/high INR) No  Elderly (>65) No  Drugs or alcohol  (>= 8 drinks/week, anti-plt or NSAID) No   Physical Exam:    VS:  BP 136/88   Pulse 85   Ht 6' 7.5 (2.019 m)   Wt (!) 322 lb 3.2 oz (146.1 kg)   SpO2 95%   BMI 35.84 kg/m     Wt Readings from Last 3 Encounters:  02/01/23 (!) 322 lb 3.2 oz (146.1 kg)  12/25/22 (!) 313 lb (142 kg)  12/04/22 (!) 314 lb 6.4 oz (142.6 kg)     GEN: Well nourished, well developed in no acute distress NECK: No JVD CARDIAC: irreg irreg, no murmurs, rubs, gallops RESPIRATORY:  Clear to auscultation without rales, wheezing or rhonchi  ABDOMEN: Soft, non-tender, non-distended EXTREMITIES:  No edema; No deformity.   ASSESSMENT:    1. Persistent atrial fibrillation (HCC)   2. H/O: GI bleed   3. Essential hypertension, benign     PLAN:    In order of problems listed above:  Symptomatic persistent atrial fibrillation: ECG today with afib, HR 101. He has required several cardioversions over the years and also failed Multaq  and amiodarone . He is currently on Eliquis  5mg  BID, but he is also not felt to be a good long term OAC candidate due to GI bleeding. He is set up for concomitant afib ablation and LAAO with Dr. Kennyth on 02/21/23. CT today with suitable anatomy. CBC/BMET today. Instruction letter and soap provided to patient.   HTN: Bp reasonably well controlled today. Continue Norvasc  5mg  daily and Lopressor  100mg  BID. Losartan  100mg  was recently discontinued by PCP because of hyperkalemia.     Medication Adjustments/Labs and Tests Ordered: Current medicines are reviewed at length with the patient today.  Concerns regarding medicines are outlined above.  Orders Placed This Encounter  Procedures   Basic metabolic panel   CBC   EKG 12-Lead   No orders of the defined types were placed  in this encounter.   Patient Instructions  Medication Instructions:  Your physician recommends that you continue on your current medications as directed. Please refer to the Current Medication list given to you today.  *If you need a refill on your cardiac medications before your next appointment, please call your pharmacy*   Lab Work: BMET, CBC - today   If you have labs (blood work) drawn today and your tests are completely normal, you will receive your  results only by: MyChart Message (if you have MyChart) OR A paper copy in the mail If you have any lab test that is abnormal or we need to change your treatment, we will call you to review the results.   Testing/Procedures: None ordered          Signed, Lamarr Hummer, PA-C  02/04/2023 10:38 AM    [name]

## 2023-02-01 NOTE — Patient Instructions (Signed)
 Medication Instructions:  Your physician recommends that you continue on your current medications as directed. Please refer to the Current Medication list given to you today.  *If you need a refill on your cardiac medications before your next appointment, please call your pharmacy*   Lab Work: BMET, CBC - today   If you have labs (blood work) drawn today and your tests are completely normal, you will receive your results only by: MyChart Message (if you have MyChart) OR A paper copy in the mail If you have any lab test that is abnormal or we need to change your treatment, we will call you to review the results.   Testing/Procedures: None ordered

## 2023-02-02 LAB — CBC
Hematocrit: 44.6 % (ref 37.5–51.0)
Hemoglobin: 14.9 g/dL (ref 13.0–17.7)
MCH: 30.6 pg (ref 26.6–33.0)
MCHC: 33.4 g/dL (ref 31.5–35.7)
MCV: 92 fL (ref 79–97)
Platelets: 310 10*3/uL (ref 150–450)
RBC: 4.87 x10E6/uL (ref 4.14–5.80)
RDW: 14.3 % (ref 11.6–15.4)
WBC: 6.7 10*3/uL (ref 3.4–10.8)

## 2023-02-02 LAB — BASIC METABOLIC PANEL
BUN/Creatinine Ratio: 13 (ref 10–24)
BUN: 20 mg/dL (ref 8–27)
CO2: 25 mmol/L (ref 20–29)
Calcium: 9.6 mg/dL (ref 8.6–10.2)
Chloride: 96 mmol/L (ref 96–106)
Creatinine, Ser: 1.55 mg/dL — ABNORMAL HIGH (ref 0.76–1.27)
Glucose: 134 mg/dL — ABNORMAL HIGH (ref 70–99)
Potassium: 5.2 mmol/L (ref 3.5–5.2)
Sodium: 135 mmol/L (ref 134–144)
eGFR: 50 mL/min/{1.73_m2} — ABNORMAL LOW (ref >59)

## 2023-02-04 ENCOUNTER — Other Ambulatory Visit: Payer: Self-pay | Admitting: Physician Assistant

## 2023-02-04 DIAGNOSIS — N182 Chronic kidney disease, stage 2 (mild): Secondary | ICD-10-CM

## 2023-02-04 NOTE — Addendum Note (Signed)
 Addended by: Janetta Hora on: 02/04/2023 10:38 AM   Modules accepted: Orders

## 2023-02-15 ENCOUNTER — Other Ambulatory Visit (HOSPITAL_COMMUNITY)
Admission: RE | Admit: 2023-02-15 | Discharge: 2023-02-15 | Disposition: A | Discharge status: Home / Self Care | Payer: Medicare HMO | Source: Ambulatory Visit | Attending: Physician Assistant | Admitting: Physician Assistant

## 2023-02-15 DIAGNOSIS — N182 Chronic kidney disease, stage 2 (mild): Secondary | ICD-10-CM | POA: Diagnosis not present

## 2023-02-15 LAB — BASIC METABOLIC PANEL
Anion gap: 12 (ref 5–15)
BUN: 30 mg/dL — ABNORMAL HIGH (ref 8–23)
CO2: 24 mmol/L (ref 22–32)
Calcium: 9.5 mg/dL (ref 8.9–10.3)
Chloride: 100 mmol/L (ref 98–111)
Creatinine, Ser: 1.46 mg/dL — ABNORMAL HIGH (ref 0.61–1.24)
GFR, Estimated: 53 mL/min — ABNORMAL LOW (ref >60)
Glucose, Bld: 151 mg/dL — ABNORMAL HIGH (ref 70–99)
Potassium: 4.7 mmol/L (ref 3.5–5.1)
Sodium: 136 mmol/L (ref 135–145)

## 2023-02-18 ENCOUNTER — Telehealth: Payer: Self-pay

## 2023-02-18 NOTE — Telephone Encounter (Signed)
Attempted pre-ablation/Watchman call.  Left message to call back.

## 2023-02-18 NOTE — Telephone Encounter (Signed)
Marland Kitchen

## 2023-02-19 NOTE — Telephone Encounter (Signed)
Confirmed procedure date of 02/21/2023. Confirmed arrival time of 0700 for procedure time at 0930. Reviewed pre-procedure instructions with patient. The patient understands to call if questions/concerns arise prior to procedure.  He was grateful for call and agreed with plan.

## 2023-02-21 ENCOUNTER — Other Ambulatory Visit: Payer: Self-pay

## 2023-02-21 ENCOUNTER — Inpatient Hospital Stay (HOSPITAL_COMMUNITY): Payer: Medicare HMO

## 2023-02-21 ENCOUNTER — Encounter (HOSPITAL_COMMUNITY): Payer: Self-pay | Admitting: Cardiology

## 2023-02-21 ENCOUNTER — Inpatient Hospital Stay (HOSPITAL_COMMUNITY)
Admission: RE | Admit: 2023-02-21 | Discharge: 2023-02-23 | DRG: 317 | Disposition: A | Discharge status: Home / Self Care | Payer: Medicare HMO | Attending: Cardiology | Admitting: Cardiology

## 2023-02-21 ENCOUNTER — Inpatient Hospital Stay (HOSPITAL_COMMUNITY): Payer: Medicare HMO | Admitting: Certified Registered"

## 2023-02-21 ENCOUNTER — Encounter (HOSPITAL_COMMUNITY)
Admission: RE | Disposition: A | Discharge status: Home / Self Care | Payer: Medicare HMO | Source: Home / Self Care | Attending: Cardiology

## 2023-02-21 ENCOUNTER — Inpatient Hospital Stay (HOSPITAL_COMMUNITY)
Admission: RE | Admit: 2023-02-21 | Discharge: 2023-02-21 | Disposition: A | Discharge status: Home / Self Care | Payer: Medicare HMO | Source: Ambulatory Visit | Attending: Cardiology | Admitting: Cardiology

## 2023-02-21 DIAGNOSIS — I4819 Other persistent atrial fibrillation: Secondary | ICD-10-CM

## 2023-02-21 DIAGNOSIS — Z85038 Personal history of other malignant neoplasm of large intestine: Secondary | ICD-10-CM

## 2023-02-21 DIAGNOSIS — I10 Essential (primary) hypertension: Secondary | ICD-10-CM

## 2023-02-21 DIAGNOSIS — Z006 Encounter for examination for normal comparison and control in clinical research program: Secondary | ICD-10-CM

## 2023-02-21 DIAGNOSIS — C182 Malignant neoplasm of ascending colon: Secondary | ICD-10-CM | POA: Diagnosis present

## 2023-02-21 DIAGNOSIS — E611 Iron deficiency: Secondary | ICD-10-CM | POA: Diagnosis not present

## 2023-02-21 DIAGNOSIS — I4891 Unspecified atrial fibrillation: Secondary | ICD-10-CM | POA: Diagnosis present

## 2023-02-21 DIAGNOSIS — D6869 Other thrombophilia: Secondary | ICD-10-CM | POA: Diagnosis not present

## 2023-02-21 DIAGNOSIS — I081 Rheumatic disorders of both mitral and tricuspid valves: Secondary | ICD-10-CM | POA: Diagnosis not present

## 2023-02-21 DIAGNOSIS — E119 Type 2 diabetes mellitus without complications: Secondary | ICD-10-CM

## 2023-02-21 DIAGNOSIS — Y838 Other surgical procedures as the cause of abnormal reaction of the patient, or of later complication, without mention of misadventure at the time of the procedure: Secondary | ICD-10-CM | POA: Diagnosis not present

## 2023-02-21 DIAGNOSIS — Z95818 Presence of other cardiac implants and grafts: Secondary | ICD-10-CM | POA: Diagnosis not present

## 2023-02-21 DIAGNOSIS — Z7901 Long term (current) use of anticoagulants: Secondary | ICD-10-CM

## 2023-02-21 DIAGNOSIS — Z85528 Personal history of other malignant neoplasm of kidney: Secondary | ICD-10-CM

## 2023-02-21 DIAGNOSIS — C642 Malignant neoplasm of left kidney, except renal pelvis: Secondary | ICD-10-CM | POA: Diagnosis present

## 2023-02-21 DIAGNOSIS — S301XXA Contusion of abdominal wall, initial encounter: Secondary | ICD-10-CM | POA: Insufficient documentation

## 2023-02-21 DIAGNOSIS — L7632 Postprocedural hematoma of skin and subcutaneous tissue following other procedure: Secondary | ICD-10-CM | POA: Diagnosis not present

## 2023-02-21 DIAGNOSIS — K922 Gastrointestinal hemorrhage, unspecified: Secondary | ICD-10-CM | POA: Diagnosis not present

## 2023-02-21 DIAGNOSIS — Z8719 Personal history of other diseases of the digestive system: Secondary | ICD-10-CM

## 2023-02-21 DIAGNOSIS — E785 Hyperlipidemia, unspecified: Secondary | ICD-10-CM | POA: Diagnosis present

## 2023-02-21 DIAGNOSIS — Z9889 Other specified postprocedural states: Secondary | ICD-10-CM

## 2023-02-21 DIAGNOSIS — D509 Iron deficiency anemia, unspecified: Secondary | ICD-10-CM | POA: Diagnosis not present

## 2023-02-21 DIAGNOSIS — C189 Malignant neoplasm of colon, unspecified: Secondary | ICD-10-CM | POA: Diagnosis present

## 2023-02-21 DIAGNOSIS — I724 Aneurysm of artery of lower extremity: Secondary | ICD-10-CM | POA: Diagnosis not present

## 2023-02-21 HISTORY — DX: Presence of other cardiac implants and grafts: Z95.818

## 2023-02-21 HISTORY — DX: Other specified postprocedural states: Z98.890

## 2023-02-21 HISTORY — PX: ATRIAL FIBRILLATION ABLATION: EP1191

## 2023-02-21 HISTORY — PX: TRANSESOPHAGEAL ECHOCARDIOGRAM (CATH LAB): EP1270

## 2023-02-21 HISTORY — PX: LEFT ATRIAL APPENDAGE OCCLUSION: EP1229

## 2023-02-21 LAB — TYPE AND SCREEN
ABO/RH(D): O POS
Antibody Screen: NEGATIVE

## 2023-02-21 LAB — GLUCOSE, CAPILLARY
Glucose-Capillary: 165 mg/dL — ABNORMAL HIGH (ref 70–99)
Glucose-Capillary: 141 mg/dL — ABNORMAL HIGH (ref 70–99)
Glucose-Capillary: 147 mg/dL — ABNORMAL HIGH (ref 70–99)

## 2023-02-21 LAB — POCT ACTIVATED CLOTTING TIME
Activated Clotting Time: 319 s
Activated Clotting Time: 268 s
Activated Clotting Time: 291 s

## 2023-02-21 LAB — SURGICAL PCR SCREEN
MRSA, PCR: NEGATIVE
Staphylococcus aureus: NEGATIVE

## 2023-02-21 LAB — ECHO TEE

## 2023-02-21 SURGERY — ATRIAL FIBRILLATION ABLATION
Anesthesia: General

## 2023-02-21 MED ORDER — PROPOFOL 500 MG/50ML IV EMUL
INTRAVENOUS | Status: DC | PRN
  Administered 2023-02-21: 90 ug/kg/min via INTRAVENOUS

## 2023-02-21 MED ORDER — POLYSACCHARIDE IRON COMPLEX 150 MG PO CAPS: 150.0000 mg | ORAL_CAPSULE | Freq: Every day | ORAL | Status: DC

## 2023-02-21 MED ORDER — LIDOCAINE 2% (20 MG/ML) 5 ML SYRINGE
INTRAMUSCULAR | Status: DC | PRN
  Administered 2023-02-21: 40 mg via INTRAVENOUS

## 2023-02-21 MED ORDER — HEPARIN SODIUM (PORCINE) 1000 UNIT/ML IJ SOLN
INTRAMUSCULAR | Status: AC
  Filled 2023-02-21: qty 10

## 2023-02-21 MED ORDER — APIXABAN 5 MG PO TABS: 5.0000 mg | ORAL_TABLET | Freq: Once | ORAL | Status: DC

## 2023-02-21 MED ORDER — PROTAMINE SULFATE 10 MG/ML IV SOLN
INTRAVENOUS | Status: DC | PRN
  Administered 2023-02-21: 25 mg via INTRAVENOUS
  Administered 2023-02-21: 10 mg via INTRAVENOUS

## 2023-02-21 MED ORDER — ATROPINE SULFATE 0.4 MG/ML IV SOLN
INTRAVENOUS | Status: DC | PRN
  Administered 2023-02-21: 1 mg via INTRAVENOUS

## 2023-02-21 MED ORDER — CEFAZOLIN SODIUM-DEXTROSE 3-4 GM/150ML-% IV SOLN
3.0000 g | INTRAVENOUS | Status: AC
  Administered 2023-02-21: 3 g via INTRAVENOUS
  Filled 2023-02-21: qty 150

## 2023-02-21 MED ORDER — PHENYLEPHRINE HCL-NACL 20-0.9 MG/250ML-% IV SOLN
INTRAVENOUS | Status: DC | PRN
  Administered 2023-02-21: 25 ug/min via INTRAVENOUS

## 2023-02-21 MED ORDER — AMLODIPINE BESYLATE 5 MG PO TABS: 5.0000 mg | ORAL_TABLET | Freq: Every day | ORAL | Status: DC

## 2023-02-21 MED ORDER — SUGAMMADEX SODIUM 200 MG/2ML IV SOLN
INTRAVENOUS | Status: DC | PRN
  Administered 2023-02-21: 300 mg via INTRAVENOUS

## 2023-02-21 MED ORDER — ACETAMINOPHEN 325 MG PO TABS
650.0000 mg | ORAL_TABLET | ORAL | Status: DC | PRN
Start: 2023-02-21 — End: 2023-02-23
  Administered 2023-02-21 – 2023-02-22 (×3): 650 mg via ORAL
  Filled 2023-02-21 (×3): qty 2

## 2023-02-21 MED ORDER — SODIUM CHLORIDE 0.9% FLUSH: 3.0000 mL | INTRAVENOUS | Status: DC | PRN

## 2023-02-21 MED ORDER — ROCURONIUM BROMIDE 10 MG/ML (PF) SYRINGE
PREFILLED_SYRINGE | INTRAVENOUS | Status: DC | PRN
  Administered 2023-02-21: 30 mg via INTRAVENOUS
  Administered 2023-02-21: 100 mg via INTRAVENOUS
  Administered 2023-02-21: 20 mg via INTRAVENOUS
  Administered 2023-02-21: 40 mg via INTRAVENOUS

## 2023-02-21 MED ORDER — SODIUM CHLORIDE 0.9 % IV SOLN: INTRAVENOUS | Status: DC

## 2023-02-21 MED ORDER — ONDANSETRON HCL 4 MG/2ML IJ SOLN
INTRAMUSCULAR | Status: DC | PRN
  Administered 2023-02-21: 4 mg via INTRAVENOUS

## 2023-02-21 MED ORDER — SODIUM CHLORIDE 0.9% FLUSH
3.0000 mL | INTRAVENOUS | Status: DC | PRN
Start: 2023-02-21 — End: 2023-02-21

## 2023-02-21 MED ORDER — CHLORHEXIDINE GLUCONATE 0.12 % MT SOLN
OROMUCOSAL | Status: AC
  Administered 2023-02-21: 15 mL
  Filled 2023-02-21: qty 15

## 2023-02-21 MED ORDER — SODIUM CHLORIDE 0.9% FLUSH
3.0000 mL | Freq: Two times a day (BID) | INTRAVENOUS | Status: DC
  Administered 2023-02-21 – 2023-02-22 (×2): 3 mL via INTRAVENOUS

## 2023-02-21 MED ORDER — PROPOFOL 10 MG/ML IV BOLUS
INTRAVENOUS | Status: DC | PRN
  Administered 2023-02-21: 200 mg via INTRAVENOUS

## 2023-02-21 MED ORDER — ONDANSETRON HCL 4 MG/2ML IJ SOLN: 4.0000 mg | Freq: Four times a day (QID) | INTRAMUSCULAR | Status: DC | PRN

## 2023-02-21 MED ORDER — INSULIN ASPART 100 UNIT/ML IJ SOLN
INTRAMUSCULAR | Status: AC
  Filled 2023-02-21: qty 1

## 2023-02-21 MED ORDER — POLYSACCHARIDE IRON COMPLEX 150 MG PO CAPS
150.0000 mg | ORAL_CAPSULE | Freq: Every day | ORAL | Status: DC
Start: 2023-02-21 — End: 2023-02-23
  Administered 2023-02-21 – 2023-02-23 (×3): 150 mg via ORAL
  Filled 2023-02-21 (×3): qty 1

## 2023-02-21 MED ORDER — HEPARIN (PORCINE) IN NACL 1000-0.9 UT/500ML-% IV SOLN
INTRAVENOUS | Status: DC | PRN
  Administered 2023-02-21: 500 mL

## 2023-02-21 MED ORDER — SODIUM CHLORIDE 0.9% FLUSH: 3.0000 mL | Freq: Two times a day (BID) | INTRAVENOUS | Status: DC

## 2023-02-21 MED ORDER — HEPARIN SODIUM (PORCINE) 1000 UNIT/ML IJ SOLN
INTRAMUSCULAR | Status: DC | PRN
  Administered 2023-02-21: 1000 [IU] via INTRAVENOUS
  Administered 2023-02-21: 21000 [IU] via INTRAVENOUS
  Administered 2023-02-21: 2000 [IU] via INTRAVENOUS
  Administered 2023-02-21: 4000 [IU] via INTRAVENOUS

## 2023-02-21 MED ORDER — FENTANYL CITRATE (PF) 250 MCG/5ML IJ SOLN
INTRAMUSCULAR | Status: DC | PRN
  Administered 2023-02-21: 100 ug via INTRAVENOUS

## 2023-02-21 MED ORDER — AMLODIPINE BESYLATE 5 MG PO TABS
5.0000 mg | ORAL_TABLET | Freq: Every day | ORAL | Status: DC
Start: 2023-02-21 — End: 2023-02-22
  Administered 2023-02-21: 5 mg via ORAL
  Filled 2023-02-21: qty 1

## 2023-02-21 MED ORDER — ACETAMINOPHEN 325 MG PO TABS: 650.0000 mg | ORAL_TABLET | ORAL | Status: DC | PRN

## 2023-02-21 MED ORDER — SODIUM CHLORIDE 0.9 % IV SOLN: 250.0000 mL | INTRAVENOUS | Status: DC | PRN

## 2023-02-21 MED ORDER — INSULIN ASPART 100 UNIT/ML IJ SOLN
0.0000 [IU] | INTRAMUSCULAR | Status: DC | PRN
  Administered 2023-02-21: 2 [IU] via SUBCUTANEOUS

## 2023-02-21 MED ORDER — SODIUM CHLORIDE 0.9% FLUSH
3.0000 mL | Freq: Two times a day (BID) | INTRAVENOUS | Status: DC
  Administered 2023-02-21 – 2023-02-22 (×3): 3 mL via INTRAVENOUS

## 2023-02-21 MED ORDER — IOHEXOL 350 MG/ML SOLN
INTRAVENOUS | Status: DC | PRN
  Administered 2023-02-21: 8 mL

## 2023-02-21 MED ORDER — FENTANYL CITRATE (PF) 100 MCG/2ML IJ SOLN
INTRAMUSCULAR | Status: AC
  Filled 2023-02-21: qty 2

## 2023-02-21 SURGICAL SUPPLY — 33 items
BAG SNAP BAND KOVER 36X36 (MISCELLANEOUS) IMPLANT
CABLE PFA RX CATH CONN (CABLE) IMPLANT
CATH FARAWAVE ABLATION 31 (CATHETERS) IMPLANT
CATH INFINITI 5FR ANG PIGTAIL (CATHETERS) IMPLANT
CATH OCTARAY 2.0 F 3-3-3-3-3 (CATHETERS) IMPLANT
CATH SOUNDSTAR ECO 8FR (CATHETERS) IMPLANT
CATH WATCHMAN STEER ACCESS SYS (CATHETERS) IMPLANT
CATH WEBSTER BI DIR CS D-F CRV (CATHETERS) IMPLANT
CLOSURE PERCLOSE PROSTYLE (VASCULAR PRODUCTS) IMPLANT
COVER SWIFTLINK CONNECTOR (BAG) ×2 IMPLANT
DEVICE CLOSURE MYNXGRIP 6/7F (Vascular Products) IMPLANT
DEVICE WATCHMAN FLX PRO PROC (KITS) IMPLANT
DEVICE WATCHMAN TRUSTEER PROC (KITS) IMPLANT
DILATOR VESSEL 38 20CM 16FR (INTRODUCER) IMPLANT
GUIDEWIRE INQWIRE 1.5J.035X260 (WIRE) IMPLANT
INQWIRE 1.5J .035X260CM (WIRE) ×1 IMPLANT
KIT HEART LEFT (KITS) ×2 IMPLANT
KIT VERSACROSS CNCT FARADRIVE (KITS) IMPLANT
MAT PREVALON FULL STRYKER (MISCELLANEOUS) IMPLANT
PACK CARDIAC CATHETERIZATION (CUSTOM PROCEDURE TRAY) ×2 IMPLANT
PACK EP LF (CUSTOM PROCEDURE TRAY) ×2 IMPLANT
PAD DEFIB RADIO PHYSIO CONN (PAD) ×2 IMPLANT
PATCH CARTO3 (PAD) IMPLANT
SHEATH FARADRIVE STEERABLE (SHEATH) IMPLANT
SHEATH PINNACLE 8F 10CM (SHEATH) IMPLANT
SHEATH PINNACLE 9F 10CM (SHEATH) IMPLANT
SHEATH PROBE COVER 6X72 (BAG) ×2 IMPLANT
SHIELD RADPAD SCOOP 12X17 (MISCELLANEOUS) ×2 IMPLANT
TRANSDUCER W/STOPCOCK (MISCELLANEOUS) ×2 IMPLANT
TUBING CIL FLEX 10 FLL-RA (TUBING) ×2 IMPLANT
WATCHMAN FLX PRO 31 (Prosthesis & Implant Heart) IMPLANT
WATCHMAN FLX PRO PROCEDURE (KITS) ×1 IMPLANT
WATCHMAN TRUSTEER PROCEDURE (KITS) ×1 IMPLANT

## 2023-02-21 NOTE — H&P (Signed)
Electrophysiology Note:   Date:  02/21/2023  ID:  Bradley Rodriguez, DOB 1960/05/09, MRN [id number]   Primary Cardiologist: Dina Rich, MD Electrophysiologist: Nobie Putnam, MD       History of Present Illness:   Bradley Rodriguez is a 63 y.o. male with h/o HTN, HLD, DM, colon cancer, atrial fibrillation who is being seen today for evaluation of his atrial fibrillation at the request of [name], [place].    The patient was initially diagnosed with atrial fibrillation in 1996. He has required several cardioversions over the years. He was seen at the ED 12/11/20 with palpitations. ECG showed rate controlled afib. He underwent successful cardioversion 01/03/21. He was then seen by Cardiology in June of 2024 and was found to be in rate controlled AF at that time. He had a successful cardioversion on 08/07/22, but when he returned to Cardiology clinic on 09/27/22 he was back in atrial fibrillation. He was started on Multaq on 10/31/22 and repeat cardioversion was done on 11/20/22 with successful conversion to sinus rhythm. He returned to AF Clinic on 12/04/22 and again was back in atrial fibrillation so Multaq was stopped and amiodarone was started.   Interval: Patient presents today for scheduled ablation and Watchman implant. Reports feeling relatively well. Still with some dyspnea with exertion. No new or acute complaints.   Review of systems complete and found to be negative unless listed in HPI.    EP Information / Studies Reviewed:     EKG is not ordered today. EKG from 12/04/22 reviewed which showed atrial fibrillation.       TEE 07/30/22: IMPRESSIONS   1. Left ventricular ejection fraction, by estimation, is 50 to 55%. The  left ventricle has low normal function. The left ventricle has no regional  wall motion abnormalities.   2. Right ventricular systolic function is normal. The right ventricular  size is normal.   3. Left atrial size was severely dilated. No left atrial/left atrial   appendage thrombus was detected. The LAA emptying velocity was 24 cm/s.   4. Right atrial size was severely dilated.   5. There is very small segment of flail motion of the P3 (medial) scallop  of the posterior mitral valve. The mitral valve is abnormal. Mild to  moderate mitral valve regurgitation. No evidence of mitral stenosis.   6. The aortic valve is tricuspid. Aortic valve regurgitation is trivial.  No aortic stenosis is present.    Risk Assessment/Calculations:     CHA2DS2-VASc Score = 2   This indicates a 2.2% annual risk of stroke. The patient's score is based upon: CHF History: 0 HTN History: 1 Diabetes History: 1 Stroke History: 0 Vascular Disease History: 0 Age Score: 0 Gender Score: 0               Physical Exam:    Today's Vitals   02/21/23 0709  BP: (!) 145/87  Pulse: 93  Resp: 18  Temp: 98.5 F (36.9 C)  SpO2: 96%  Weight: (!) 141.5 kg  Height: 6' 7.5" (2.019 m)   Body mass index is 34.71 kg/m.   GEN: Well nourished, well developed in no acute distress NECK: No JVD CARDIAC: Normal rate, irregular rhythm.  RESPIRATORY:  Clear to auscultation without rales, wheezing or rhonchi  ABDOMEN: Soft, non-distended EXTREMITIES:  No edema; No deformity    ASSESSMENT AND PLAN:   Bradley Rodriguez is a 63 y.o. male with h/o HTN, HLD, DM, colon cancer, atrial fibrillation who  is being seen today for evaluation of his atrial fibrillation at the request of [name], [place].    #. Persistent atrial fibrillation: Symptomatic. Failed Multaq. Started on amiodarone, still in AF with controlled rates. - Continue Eliquis.  - Discussed treatment options today for AF including antiarrhythmic drug therapy and ablation. Discussed risks, recovery and likelihood of success with each treatment strategy. Risk, benefits, and alternatives to EP study and ablation for afib were discussed. These risks include but are not limited to stroke, bleeding, vascular damage, tamponade,  perforation, damage to the esophagus, lungs, phrenic nerve and other structures, pulmonary vein stenosis, worsening renal function, coronary vasospasm and death.  Discussed potential need for repeat ablation procedures and antiarrhythmic drugs after an initial ablation. The patient understands these risk and wishes to proceed today.   #. 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 History of colon cancer with GI bleeding #. Chronic iron deficiency anemia - Patient has previously been off oral-anticoagulation for many years despite his AF diagnosis. He would only take oral anti-coagulation around the time of cardioversion. I believe the patient will benefit from Watchman device given their history of atrial fibrillation, CHA2DS2-VASc score of 2 and unadjusted ischemic stroke rate of 2.2% per year. Unfortunately, the patient is not felt to be a long term anticoagulation candidate secondary to history of GI bleeding. The patient's chart has been reviewed and I feel that they would be a candidate for short term oral anticoagulation after Watchman implant.    It is my belief that after undergoing a LAA closure procedure, Bradley Rodriguez will not need long term anticoagulation which eliminates anticoagulation side effects and major bleeding risk.    Procedural risks for the Watchman implant have been reviewed with the patient including a 0.5% risk of stroke, <1% risk of perforation and <1% risk of device embolization. Other risks include bleeding, vascular damage, tamponade, worsening renal function, and death. The patient understands these risk and wishes to proceed.       The published clinical data on the safety and effectiveness of WATCHMAN include but are not limited to the following: - Holmes DR, Everlene Farrier, Sick P et al. for the PROTECT AF Investigators. Percutaneous closure of the left atrial appendage versus warfarin therapy for prevention of stroke in patients with atrial  fibrillation: a randomised non-inferiority trial. Lancet 2009; 374: 534-42. Everlene Farrier, Doshi SK, Isa Rankin D et al. on behalf of the PR[employer]ECT AF Investigators. Percutaneous Left Atrial Appendage Closure for Stroke Prophylaxis in Patients With Atrial Fibrillation 2.3-Year Follow-up of the PROTECT AF (Watchman Left Atrial Appendage System for Embolic Protection in Patients With Atrial Fibrillation) Trial. Circulation 2013; 127:720-729. - Alli O, Doshi S,  Kar S, Reddy VY, Sievert H et al. Quality of Life Assessment in the Randomized PROTECT AF (Percutaneous Closure of the Left Atrial Appendage Versus Warfarin Therapy for Prevention of Stroke in Patients With Atrial Fibrillation) Trial of Patients at Risk for Stroke With Nonvalvular Atrial Fibrillation. J Am Coll Cardiol 2013; 61:1790-8. Aline August DR, Mia [place], Price M, Whisenant B, Sievert H, Doshi S, Huber K, Reddy V. Prospective randomized evaluation of the Watchman left atrial appendage Device in patients with atrial fibrillation versus long-term warfarin therapy; the PREVAIL trial. Journal of the [employer] of Cardiology, Vol. 4, No. 1, 2014, 1-11. - Kar S, Doshi SK, Sadhu A, Horton R, Osorio J et al. Primary outcome evaluation of a next-generation left atrial appendage closure device: results from the PINNACLE  FLX trial. Circulation 2021;143(18)1754-1762.    HAS-BLED score: 2 Hypertension Yes  Abnormal renal and liver function (Dialysis, transplant, Cr >2.26 mg/dL /Cirrhosis or Bilirubin >2x Normal or AST/ALT/AP >3x Normal) No  Stroke No  Bleeding Yes  Labile INR (Unstable/high INR) No  Elderly (>65) No  Drugs or alcohol (>= 8 drinks/week, anti-plt or NSAID) No    Proceed with AF ablation and Watchman implant today.   Signed, Nobie Putnam, MD

## 2023-02-21 NOTE — Discharge Summary (Incomplete Revision)
[facility]    Patient ID: Bradley Rodriguez,  MRN: [id number], DOB/AGE: February 08, 1960 63 y.o.  Admit date: 02/21/2023 Discharge date: 02/22/2023  Primary Care Physician: Richardean Chimera, MD  Primary Cardiologist: Dina Rich, MD  Electrophysiologist: Nobie Putnam, MD  Primary Discharge Diagnosis:  Persistent Atrial Fibrillation Poor candidacy for long term anticoagulation due to h/o GI bleeding  Procedures This Admission:  Transeptal Puncture Intra-procedural TEE which showed no LAA thrombus Left atrial appendage occlusive device placement and atrial fibrillation ablation on 02/21/23 by Dr. Jimmey Ralph.   Brief HPI: Bradley Rodriguez is a 63 y.o. male with a history of HTN, HLD, DM, colon cancer, and difficult to control atrial fibrillation who was referred to Dr. Jimmey Ralph for AF ablation and concomitant LAAO with Watchman placement.   Bradley Rodriguez was initially diagnosed with AF in 1996. He underwent several cardioversions over the years and has typically done well and maintained SR for some time until recently. He had a cardioversion 07/2022 and was seen in the AF Clinic shortly after but was back in AF once again. Multaq was started with a second cardioversion attempt while on antiarrythmics. By follow up he was back in AF therefore Multaq was stopped and Amiodarone was initiated. He was then referred to Dr. Jimmey Ralph for AF ablation consideration. Given his hx of colon cancer with GI bleeding, concomitant ablation with Watchman was discussed. He was felt to be a good candidate and the patient was scheduled for 02/21/23.   Hospital Course:  The patient was admitted and underwent successful AF ablation and concomitant left atrial appendage occlusive device placement with 31mm Watchman FLX Pro on 02/21/23. He was monitored in the post procedure setting and has done very well with no concerns. Given this, he is being considered for same day discharge later today. Groin site has been stable  without evidence of hematoma or bleeding. Wound care and restrictions were reviewed with the patient. The patient has been scheduled for post procedure follow up with [name], [place] on 03/29/23. He will restart Eliquis and continue for 3 months post ablation. He will require dental SBE for 6 months post LAAO which will be discussed and RXd at follow up. A repeat CT will be performed in approximately 60 days to ensure proper seal of the device.   ADDEND: Patient kept overnight given right groin hematoma noted by RN after bedrest and ambulation completed. Cath lab called and manual pressure held with good control. R groin US obtained prior to discharge which shows ***  Physical Exam: Vitals:   02/21/23 2234 02/21/23 2329 02/22/23 0500 02/22/23 0847  BP: 133/78 (!) 142/86 (!) 152/83 (!) 149/96  Pulse:  81 73 80  Resp:  19 (!) 9 20  Temp:  98.7 F (37.1 C) 98.7 F (37.1 C) 99.2 F (37.3 C)  TempSrc:  Oral Oral Oral  SpO2:  97% 99%   Weight:      Height:       General: Well developed, well nourished, NAD Lungs:Clear to ausculation bilaterally. No wheezes, rales, or rhonchi. Breathing is unlabored. Cardiovascular: RRR with S1 S2. No murmurs Abdomen: Soft, non-tender, non-distended. No obvious abdominal masses. Extremities: No edema. Groin site stable with CDI dressing.  Neuro: Alert and oriented. No focal deficits. No facial asymmetry. MAE spontaneously. Psych: Responds to questions appropriately with normal affect.    Labs: Lab Results  Component Value Date   WBC 6.7 02/01/2023   HGB 14.9 02/01/2023   HCT  44.6 02/01/2023   MCV 92 02/01/2023   PLT 310 02/01/2023    Recent Labs  Lab 02/15/23 1045  NA 136  K 4.7  CL 100  CO2 24  BUN 30*  CREATININE 1.46*  CALCIUM 9.5  GLUCOSE 151*   Discharge Medications:  Allergies as of 02/22/2023       Reactions   Ace Inhibitors Cough   Lisinopril Cough   Xarelto [rivaroxaban] Other (See Comments)   Heart out of rhythm    Quinine  Derivatives Rash     Med Rec must be completed prior to using this SMARTLINK***       Disposition:  Home  Discharge Instructions     Call MD for:  difficulty breathing, headache or visual disturbances   Complete by: As directed    Call MD for:  extreme fatigue   Complete by: As directed    Call MD for:  hives   Complete by: As directed    Call MD for:  persistant dizziness or light-headedness   Complete by: As directed    Call MD for:  persistant nausea and vomiting   Complete by: As directed    Call MD for:  redness, tenderness, or signs of infection (pain, swelling, redness, odor or green/yellow discharge around incision site)   Complete by: As directed    Call MD for:  severe uncontrolled pain   Complete by: As directed    Call MD for:  temperature >100.4   Complete by: As directed    Diet - low sodium heart healthy   Complete by: As directed    Discharge instructions   Complete by: As directed    Cardiac Ablation, Care After  This sheet gives you information about how to care for yourself after your procedure. Your health care provider may also give you more specific instructions. If you have problems or questions, contact your health care provider. What can I expect after the procedure? After the procedure, it is common to have: Bruising around your puncture site. Tenderness around your puncture site. Skipped heartbeats. If you had an atrial fibrillation ablation, you may have atrial fibrillation during the first several months after your procedure.  Tiredness (fatigue).  Follow these instructions at home: Puncture site care  Follow instructions from your health care provider about how to take care of your puncture site. Make sure you: If present, leave stitches (sutures), skin glue, or adhesive strips in place. These skin closures may need to stay in place for up to 2 weeks. If adhesive strip edges start to loosen and curl up, you may trim the loose edges. Do not  remove adhesive strips completely unless your health care provider tells you to do that. If a large square bandage is present, this may be removed 24 hours after surgery.  Check your puncture site every day for signs of infection. Check for: Redness, swelling, or pain. Fluid or blood. If your puncture site starts to bleed, lie down on your back, apply firm pressure to the area, and contact your health care provider. Warmth. Pus or a bad smell. A pea or marble sized lump/knot at the site is normal and can take up to three months to resolve.  Driving Do not drive for at least 4 days after your procedure or however long your health care provider recommends. (Do not resume driving if you have previously been instructed not to drive for other health reasons.) Do not drive or use heavy machinery while taking prescription  pain medicine. Activity Avoid activities that take a lot of effort for at least 7 days after your procedure. Do not lift anything that is heavier than 5 lb (4.5 kg) for one week.  No sexual activity for 1 week.  Return to your normal activities as told by your health care provider. Ask your health care provider what activities are safe for you. General instructions Take over-the-counter and prescription medicines only as told by your health care provider. Do not use any products that contain nicotine or tobacco, such as cigarettes and e-cigarettes. If you need help quitting, ask your health care provider. You may shower after 24 hours, but Do not take baths, swim, or use a hot tub for 1 week.  Do not drink alcohol for 24 hours after your procedure. Keep all follow-up visits as told by your health care provider. This is important. Contact a health care provider if: You have redness, mild swelling, or pain around your puncture site. You have fluid or blood coming from your puncture site that stops after applying firm pressure to the area. Your puncture site feels warm to the  touch. You have pus or a bad smell coming from your puncture site. You have a fever. You have chest pain or discomfort that spreads to your neck, jaw, or arm. You have chest pain that is worse with lying on your back or taking a deep breath. You are sweating a lot. You feel nauseous. You have a fast or irregular heartbeat. You have shortness of breath. You are dizzy or light-headed and feel the need to lie down. You have pain or numbness in the arm or leg closest to your puncture site. Get help right away if: Your puncture site suddenly swells. Your puncture site is bleeding and the bleeding does not stop after applying firm pressure to the area. These symptoms may represent a serious problem that is an emergency. Do not wait to see if the symptoms will go away. Get medical help right away. Call your local emergency services (911 in the [place] Do not drive yourself to the hospital. Summary After the procedure, it is normal to have bruising and tenderness at the puncture site in your groin, neck, or forearm. Check your puncture site every day for signs of infection. Get help right away if your puncture site is bleeding and the bleeding does not stop after applying firm pressure to the area. This is a medical emergency. This information is not intended to replace advice given to you by your health care provider. Make sure you discuss any questions you have with your health care provider.   _______________________________________________________________________________________   [facility] Procedure, Care After  Procedure MD: Dr. Alene Mires Clinical Coordinator: Karsten Fells, RN  This sheet gives you information about how to care for yourself after your procedure. Your health care provider may also give you more specific instructions. If you have problems or questions, contact your health care provider.  What can I expect after the procedure? After the procedure, it is common to  have: Bruising around your puncture site. Tenderness around your puncture site. Tiredness (fatigue).  Medication instructions It is very important to continue to take your blood thinner as directed by your doctor after the Watchman procedure. Call your procedure doctor's office with question or concerns. If you are on Coumadin (warfarin), you will have your INR checked the week after your procedure, with a goal INR of 2.0 - 3.0. Please follow your medication instructions on your discharge  summary. Only take the medications listed on your discharge paperwork.  Follow up You will be seen in 1 month after your procedure You will have a repeat CT scan approximately 8 weeks after your procedure mark to check your device You will follow up the MD/APP who performed your procedure 6 months after your procedure The Watchman Clinical Coordinator will check in with you from time to time, including 1 and 2 years after your procedure.    Follow these instructions at home: Puncture site care  Follow instructions from your health care provider about how to take care of your puncture site. Make sure you: If present, leave stitches (sutures), skin glue, or adhesive strips in place.  If a large square bandage is present, this may be removed 24 hours after surgery.  Check your puncture site every day for signs of infection. Check for: Redness, swelling, or pain. Fluid or blood. If your puncture site starts to bleed, lie down on your back, apply firm pressure to the area, and contact your health care provider. Warmth. Pus or a bad smell. Driving Do not drive yourself home if you received sedation Do not drive for at least 4 days after your procedure or however long your health care provider recommends. (Do not resume driving if you have previously been instructed not to drive for other health reasons.) Do not spend greater than 1 hour at a time in a car for the first 3 days. Stop and take a break with a 5  minute walk at least every hour.  Do not drive or use heavy machinery while taking prescription pain medicine.  Activity Avoid activities that take a lot of effort, including exercise, for at least 7 days after your procedure. For the first 3 days, avoid sitting for longer than one hour at a time.  Avoid alcoholic beverages, signing paperwork, or participating in legal proceedings for 24 hours after receiving sedation Do not lift anything that is heavier than 10 lb (4.5 kg) for one week.  No sexual activity for 1 week.  Return to your normal activities as told by your health care provider. Ask your health care provider what activities are safe for you. General instructions Take over-the-counter and prescription medicines only as told by your health care provider. Do not use any products that contain nicotine or tobacco, such as cigarettes and e-cigarettes. If you need help quitting, ask your health care provider. You may shower after 24 hours, but Do not take baths, swim, or use a hot tub for 1 week.  Do not drink alcohol for 24 hours after your procedure. Keep all follow-up visits as told by your health care provider. This is important. Dental Work: You will require antibiotics prior to any dental work, including cleanings, for 6 months after your Watchman implantation to help protect you from infection. After 6 months, antibiotics are no longer required. Contact a health care provider if: You have redness, mild swelling, or pain around your puncture site. You have soreness in your throat or at your puncture site that does not improve after several days You have fluid or blood coming from your puncture site that stops after applying firm pressure to the area. Your puncture site feels warm to the touch. You have pus or a bad smell coming from your puncture site. You have a fever. You have chest pain or discomfort that spreads to your neck, jaw, or arm. You are sweating a lot. You feel  nauseous. You have a fast  or irregular heartbeat. You have shortness of breath. You are dizzy or light-headed and feel the need to lie down. You have pain or numbness in the arm or leg closest to your puncture site. Get help right away if: Your puncture site suddenly swells. Your puncture site is bleeding and the bleeding does not stop after applying firm pressure to the area. These symptoms may represent a serious problem that is an emergency. Do not wait to see if the symptoms will go away. Get medical help right away. Call your local emergency services (911 in the [place] Do not drive yourself to the hospital. Summary After the procedure, it is normal to have bruising and tenderness at the puncture site in your groin, neck, or forearm. Check your puncture site every day for signs of infection. Get help right away if your puncture site is bleeding and the bleeding does not stop after applying firm pressure to the area. This is a medical emergency.  This information is not intended to replace advice given to you by your health care provider. Make sure you discuss any questions you have with your health care provider.   Increase activity slowly   Complete by: As directed        Follow-up Information     Eustace Pen, PA-C Follow up on 03/29/2023.   Specialty: Cardiology Why: Your follow up appoitment is scheduled in the AF Clinic on 03/29/23 at 9AM. Please plan to arrive by 8:45AM. Contact information: [address] [place] [address] [phone]                 Duration of Discharge Encounter:  APP Time: 45 minutes   Signed, Georgie Chard, NP  02/22/2023 10:41 AM

## 2023-02-21 NOTE — Discharge Summary (Cosign Needed Addendum)
[facility]    Patient ID: Bradley Rodriguez,  MRN: [id number], DOB/AGE: 65-Feb-1962 65 y.o.  Admit date: 02/21/2023 Discharge date: 02/23/2023  Primary Care Physician: Bradley Chimera, MD  Primary Cardiologist: Bradley Rich, MD  Electrophysiologist: Bradley Putnam, MD  Primary Discharge Diagnosis:  Persistent Atrial Fibrillation Poor candidacy for long term anticoagulation due to h/o GI bleeding  Procedures This Admission:  Transeptal Puncture Intra-procedural TEE which showed no LAA thrombus Left atrial appendage occlusive device placement and atrial fibrillation ablation on 02/21/23 by Dr. Jimmey Ralph.   Brief HPI: Bradley Rodriguez is a 63 y.o. male with a history of HTN, HLD, DM, colon cancer, and difficult to control atrial fibrillation who was referred to Dr. Jimmey Ralph for AF ablation and concomitant LAAO with Watchman placement.   Bradley Rodriguez was initially diagnosed with AF in 1996. He underwent several cardioversions over the years and has typically done well and maintained SR for some time until recently. He had a cardioversion 07/2022 and was seen in the AF Clinic shortly after but was back in AF once again. Multaq was started with a second cardioversion attempt while on antiarrythmics. By follow up he was back in AF therefore Multaq was stopped and Amiodarone was initiated. He was then referred to Dr. Jimmey Ralph for AF ablation consideration. Given his hx of colon cancer with GI bleeding, concomitant ablation with Watchman was discussed. He was felt to be a good candidate and the patient was scheduled for 02/21/23.   Hospital Course:  The patient was admitted and underwent successful AF ablation and concomitant left atrial appendage occlusive device placement with 31mm Watchman FLX Pro on 02/21/23. He was monitored in the post procedure setting and has done very well with no concerns. Given this, he is being considered for same day discharge later today. Groin site has been stable  without evidence of hematoma or bleeding. Wound care and restrictions were reviewed with the patient. The patient has been scheduled for post procedure follow up with [name], PA on 03/29/23. He will restart Eliquis and continue for 3 months post ablation. He will require dental SBE for 6 months post LAAO which will be discussed and RXd at follow up. A repeat CT will be performed in approximately 60 days to ensure proper seal of the device.   ADDEND: Patient kept overnight given right groin hematoma noted by RN after bedrest and ambulation completed. Cath lab called and manual pressure held with good control. R groin US obtained prior to discharge was negative for PSA. He was observed and deemed stable for discharge 02/23/23 per Dr. Jimmey Ralph.   Physical Exam: Vitals:   02/22/23 1928 02/22/23 2007 02/22/23 2105 02/23/23 0611  BP: (!) 147/84 (!) 147/84 (!) 109/57 131/78  Pulse: 77  75 67  Resp: 20   19  Temp: 99.1 F (37.3 C)   98.7 F (37.1 C)  TempSrc: Oral   Oral  SpO2: 98%   100%  Weight:      Height:       Exam per MD   Labs: Lab Results  Component Value Date   WBC 6.7 02/01/2023   HGB 14.9 02/01/2023   HCT 44.6 02/01/2023   MCV 92 02/01/2023   PLT 310 02/01/2023    No results for input(s): "NA", "K", "CL", "CO2", "BUN", "CREATININE", "CALCIUM", "PROT", "BILITOT", "ALKPHOS", "ALT", "AST", "GLUCOSE" in the last 168 hours.  Invalid input(s): "LABALBU"  Discharge Medications:  Allergies as of 02/23/2023  Reactions   Ace Inhibitors Cough   Lisinopril Cough   Xarelto [rivaroxaban] Other (See Comments)   Heart out of rhythm    Quinine Derivatives Rash        Medication List     TAKE these medications    amLODipine 5 MG tablet Commonly known as: NORVASC Take 1 tablet (5 mg total) by mouth daily. What changed: when to take this   apixaban 5 MG Tabs tablet Commonly known as: Eliquis Take 1 tablet (5 mg total) by mouth 2 (two) times daily. Notes to patient:  Restart this evening, 02/21/23.    atorvastatin 20 MG tablet Commonly known as: LIPITOR Take 1 tablet (20 mg total) by mouth at bedtime.   DULoxetine 60 MG capsule Commonly known as: CYMBALTA Take 60 mg by mouth in the morning.   iron polysaccharides 150 MG capsule Commonly known as: NIFEREX Take 150 mg by mouth 2 (two) times daily.   MAGNESIUM GLYCINATE PO Take 420 mg by mouth at bedtime.   metoprolol tartrate 100 MG tablet Commonly known as: LOPRESSOR TAKE ONE TABLET BY MOUTH TWICE DAILY   OVER THE COUNTER MEDICATION Take 1 tablet by mouth 2 (two) times daily. Glucocil OTC supplement for blood sugar   Vitamin D (Ergocalciferol) 1.25 MG (50000 UNIT) Caps capsule Commonly known as: DRISDOL Take 50,000 Units by mouth every Sunday.   ZINC GLUCONATE PO Take 1 tablet by mouth daily.        Disposition:  Home  Discharge Instructions     Call MD for:  difficulty breathing, headache or visual disturbances   Complete by: As directed    Call MD for:  extreme fatigue   Complete by: As directed    Call MD for:  hives   Complete by: As directed    Call MD for:  persistant dizziness or light-headedness   Complete by: As directed    Call MD for:  persistant nausea and vomiting   Complete by: As directed    Call MD for:  redness, tenderness, or signs of infection (pain, swelling, redness, odor or green/yellow discharge around incision site)   Complete by: As directed    Call MD for:  severe uncontrolled pain   Complete by: As directed    Call MD for:  temperature >100.4   Complete by: As directed    Diet - low sodium heart healthy   Complete by: As directed    Diet - low sodium heart healthy   Complete by: As directed    Discharge instructions   Complete by: As directed    Cardiac Ablation, Care After  This sheet gives you information about how to care for yourself after your procedure. Your health care provider may also give you more specific instructions. If you have  problems or questions, contact your health care provider. What can I expect after the procedure? After the procedure, it is common to have: Bruising around your puncture site. Tenderness around your puncture site. Skipped heartbeats. If you had an atrial fibrillation ablation, you may have atrial fibrillation during the first several months after your procedure.  Tiredness (fatigue).  Follow these instructions at home: Puncture site care  Follow instructions from your health care provider about how to take care of your puncture site. Make sure you: If present, leave stitches (sutures), skin glue, or adhesive strips in place. These skin closures may need to stay in place for up to 2 weeks. If adhesive strip edges start to loosen and  curl up, you may trim the loose edges. Do not remove adhesive strips completely unless your health care provider tells you to do that. If a large square bandage is present, this may be removed 24 hours after surgery.  Check your puncture site every day for signs of infection. Check for: Redness, swelling, or pain. Fluid or blood. If your puncture site starts to bleed, lie down on your back, apply firm pressure to the area, and contact your health care provider. Warmth. Pus or a bad smell. A pea or marble sized lump/knot at the site is normal and can take up to three months to resolve.  Driving Do not drive for at least 4 days after your procedure or however long your health care provider recommends. (Do not resume driving if you have previously been instructed not to drive for other health reasons.) Do not drive or use heavy machinery while taking prescription pain medicine. Activity Avoid activities that take a lot of effort for at least 7 days after your procedure. Do not lift anything that is heavier than 5 lb (4.5 kg) for one week.  No sexual activity for 1 week.  Return to your normal activities as told by your health care provider. Ask your health care  provider what activities are safe for you. General instructions Take over-the-counter and prescription medicines only as told by your health care provider. Do not use any products that contain nicotine or tobacco, such as cigarettes and e-cigarettes. If you need help quitting, ask your health care provider. You may shower after 24 hours, but Do not take baths, swim, or use a hot tub for 1 week.  Do not drink alcohol for 24 hours after your procedure. Keep all follow-up visits as told by your health care provider. This is important. Contact a health care provider if: You have redness, mild swelling, or pain around your puncture site. You have fluid or blood coming from your puncture site that stops after applying firm pressure to the area. Your puncture site feels warm to the touch. You have pus or a bad smell coming from your puncture site. You have a fever. You have chest pain or discomfort that spreads to your neck, jaw, or arm. You have chest pain that is worse with lying on your back or taking a deep breath. You are sweating a lot. You feel nauseous. You have a fast or irregular heartbeat. You have shortness of breath. You are dizzy or light-headed and feel the need to lie down. You have pain or numbness in the arm or leg closest to your puncture site. Get help right away if: Your puncture site suddenly swells. Your puncture site is bleeding and the bleeding does not stop after applying firm pressure to the area. These symptoms may represent a serious problem that is an emergency. Do not wait to see if the symptoms will go away. Get medical help right away. Call your local emergency services (911 in the [place] Do not drive yourself to the hospital. Summary After the procedure, it is normal to have bruising and tenderness at the puncture site in your groin, neck, or forearm. Check your puncture site every day for signs of infection. Get help right away if your puncture site is bleeding  and the bleeding does not stop after applying firm pressure to the area. This is a medical emergency. This information is not intended to replace advice given to you by your health care provider. Make sure you discuss any questions  you have with your health care provider.   _______________________________________________________________________________________   [facility] Procedure, Care After  Procedure MD: Dr. Alene Mires Clinical Coordinator: Karsten Fells, RN  This sheet gives you information about how to care for yourself after your procedure. Your health care provider may also give you more specific instructions. If you have problems or questions, contact your health care provider.  What can I expect after the procedure? After the procedure, it is common to have: Bruising around your puncture site. Tenderness around your puncture site. Tiredness (fatigue).  Medication instructions It is very important to continue to take your blood thinner as directed by your doctor after the Watchman procedure. Call your procedure doctor's office with question or concerns. If you are on Coumadin (warfarin), you will have your INR checked the week after your procedure, with a goal INR of 2.0 - 3.0. Please follow your medication instructions on your discharge summary. Only take the medications listed on your discharge paperwork.  Follow up You will be seen in 1 month after your procedure You will have a repeat CT scan approximately 8 weeks after your procedure mark to check your device You will follow up the MD/APP who performed your procedure 6 months after your procedure The Watchman Clinical Coordinator will check in with you from time to time, including 1 and 2 years after your procedure.    Follow these instructions at home: Puncture site care  Follow instructions from your health care provider about how to take care of your puncture site. Make sure you: If present, leave stitches  (sutures), skin glue, or adhesive strips in place.  If a large square bandage is present, this may be removed 24 hours after surgery.  Check your puncture site every day for signs of infection. Check for: Redness, swelling, or pain. Fluid or blood. If your puncture site starts to bleed, lie down on your back, apply firm pressure to the area, and contact your health care provider. Warmth. Pus or a bad smell. Driving Do not drive yourself home if you received sedation Do not drive for at least 4 days after your procedure or however long your health care provider recommends. (Do not resume driving if you have previously been instructed not to drive for other health reasons.) Do not spend greater than 1 hour at a time in a car for the first 3 days. Stop and take a break with a 5 minute walk at least every hour.  Do not drive or use heavy machinery while taking prescription pain medicine.  Activity Avoid activities that take a lot of effort, including exercise, for at least 7 days after your procedure. For the first 3 days, avoid sitting for longer than one hour at a time.  Avoid alcoholic beverages, signing paperwork, or participating in legal proceedings for 24 hours after receiving sedation Do not lift anything that is heavier than 10 lb (4.5 kg) for one week.  No sexual activity for 1 week.  Return to your normal activities as told by your health care provider. Ask your health care provider what activities are safe for you. General instructions Take over-the-counter and prescription medicines only as told by your health care provider. Do not use any products that contain nicotine or tobacco, such as cigarettes and e-cigarettes. If you need help quitting, ask your health care provider. You may shower after 24 hours, but Do not take baths, swim, or use a hot tub for 1 week.  Do not drink alcohol for 24  hours after your procedure. Keep all follow-up visits as told by your health care provider.  This is important. Dental Work: You will require antibiotics prior to any dental work, including cleanings, for 6 months after your Watchman implantation to help protect you from infection. After 6 months, antibiotics are no longer required. Contact a health care provider if: You have redness, mild swelling, or pain around your puncture site. You have soreness in your throat or at your puncture site that does not improve after several days You have fluid or blood coming from your puncture site that stops after applying firm pressure to the area. Your puncture site feels warm to the touch. You have pus or a bad smell coming from your puncture site. You have a fever. You have chest pain or discomfort that spreads to your neck, jaw, or arm. You are sweating a lot. You feel nauseous. You have a fast or irregular heartbeat. You have shortness of breath. You are dizzy or light-headed and feel the need to lie down. You have pain or numbness in the arm or leg closest to your puncture site. Get help right away if: Your puncture site suddenly swells. Your puncture site is bleeding and the bleeding does not stop after applying firm pressure to the area. These symptoms may represent a serious problem that is an emergency. Do not wait to see if the symptoms will go away. Get medical help right away. Call your local emergency services (911 in the [place] Do not drive yourself to the hospital. Summary After the procedure, it is normal to have bruising and tenderness at the puncture site in your groin, neck, or forearm. Check your puncture site every day for signs of infection. Get help right away if your puncture site is bleeding and the bleeding does not stop after applying firm pressure to the area. This is a medical emergency.  This information is not intended to replace advice given to you by your health care provider. Make sure you discuss any questions you have with your health care provider.    Discharge instructions   Complete by: As directed    No driving for 2 days. No lifting over 5 lbs for 1 week. No sexual activity for 1 week. Keep procedure site clean & dry. If you notice increased pain, swelling, bleeding or pus, call/return!  You may shower, but no soaking baths/hot tubs/pools for 1 week.   Increase activity slowly   Complete by: As directed    Increase activity slowly   Complete by: As directed        Follow-up Information     Eustace Pen, PA-C Follow up on 03/29/2023.   Specialty: Cardiology Why: Your follow up appoitment is scheduled in the [facility] on 03/29/23 at 9AM. Please plan to arrive by 8:45AM. Contact information: [address] [place] [address] [phone]                 Duration of Discharge Encounter:  APP Time: 45 minutes   Signed, Marcelino Duster, [place]  02/23/2023 9:34 AM

## 2023-02-21 NOTE — Anesthesia Preprocedure Evaluation (Signed)
Anesthesia Evaluation  Patient identified by MRN, date of birth, ID band Patient awake    Reviewed: Allergy & Precautions, NPO status , Patient's Chart, lab work & pertinent test results  History of Anesthesia Complications Negative for: history of anesthetic complications  Airway Mallampati: III  TM Distance: >3 FB Neck ROM: Full    Dental  (+)    Pulmonary neg shortness of breath, neg sleep apnea, neg COPD, neg recent URI   breath sounds clear to auscultation       Cardiovascular hypertension, Pt. on medications (-) angina (-) Past MI + dysrhythmias Atrial Fibrillation  Rhythm:Irregular  1. Left ventricular ejection fraction, by estimation, is 50 to 55%. The  left ventricle has low normal function. The left ventricle has no regional  wall motion abnormalities.   2. Right ventricular systolic function is normal. The right ventricular  size is normal.   3. Left atrial size was severely dilated. No left atrial/left atrial  appendage thrombus was detected. The LAA emptying velocity was 24 cm/s.   4. Right atrial size was severely dilated.   5. There is very small segment of flail motion of the P3 (medial) scallop  of the posterior mitral valve. The mitral valve is abnormal. Mild to  moderate mitral valve regurgitation. No evidence of mitral stenosis.   6. The aortic valve is tricuspid. Aortic valve regurgitation is trivial.  No aortic stenosis is present.     Neuro/Psych   Anxiety     negative neurological ROS     GI/Hepatic Neg liver ROS,GERD  ,,  Endo/Other  diabetes    Renal/GU Renal diseaseLab Results      Component                Value               Date                      NA                       136                 02/15/2023                K                        4.7                 02/15/2023                CO2                      24                  02/15/2023                GLUCOSE                  151 (H)              02/15/2023                BUN                      30 (H)              02/15/2023  CREATININE               1.46 (H)            02/15/2023                CALCIUM                  9.5                 02/15/2023                EGFR                     50 (L)              02/01/2023                GFRNONAA                 53 (L)              02/15/2023                Musculoskeletal  (+) Arthritis ,    Abdominal   Peds  Hematology Lab Results      Component                Value               Date                      WBC                      6.7                 02/01/2023                HGB                      14.9                02/01/2023                HCT                      44.6                02/01/2023                MCV                      92                  02/01/2023                PLT                      310                 02/01/2023             eliquis   Anesthesia Other Findings   Reproductive/Obstetrics                              Anesthesia Physical Anesthesia Plan  ASA: 3  Anesthesia Plan: General   Post-op Pain Management: Minimal or no pain anticipated  Induction: Intravenous  PONV Risk Score and Plan: 2 and Ondansetron, Dexamethasone and Propofol infusion  Airway Management Planned: Oral ETT  Additional Equipment: None  Intra-op Plan:   Post-operative Plan: Extubation in OR  Informed Consent: I have reviewed the patients History and Physical, chart, labs and discussed the procedure including the risks, benefits and alternatives for the proposed anesthesia with the patient or authorized representative who has indicated his/her understanding and acceptance.     Dental advisory given  Plan Discussed with: CRNA  Anesthesia Plan Comments:          Anesthesia Quick Evaluation

## 2023-02-21 NOTE — Discharge Instructions (Signed)
Cardiac Ablation, Care After  This sheet gives you information about how to care for yourself after your procedure. Your health care provider may also give you more specific instructions. If you have problems or questions, contact your health care provider. What can I expect after the procedure? After the procedure, it is common to have: Bruising around your puncture site. Tenderness around your puncture site. Skipped heartbeats. If you had an atrial fibrillation ablation, you may have atrial fibrillation during the first several months after your procedure.  Tiredness (fatigue).  Follow these instructions at home: Puncture site care  Follow instructions from your health care provider about how to take care of your puncture site. Make sure you: If present, leave stitches (sutures), skin glue, or adhesive strips in place. These skin closures may need to stay in place for up to 2 weeks. If adhesive strip edges start to loosen and curl up, you may trim the loose edges. Do not remove adhesive strips completely unless your health care provider tells you to do that. If a large square bandage is present, this may be removed 24 hours after surgery.  Check your puncture site every day for signs of infection. Check for: Redness, swelling, or pain. Fluid or blood. If your puncture site starts to bleed, lie down on your back, apply firm pressure to the area, and contact your health care provider. Warmth. Pus or a bad smell. A pea or marble sized lump/knot at the site is normal and can take up to three months to resolve.  Driving Do not drive for at least 4 days after your procedure or however long your health care provider recommends. (Do not resume driving if you have previously been instructed not to drive for other health reasons.) Do not drive or use heavy machinery while taking prescription pain medicine. Activity Avoid activities that take a lot of effort for at least 7 days after your  procedure. Do not lift anything that is heavier than 5 lb (4.5 kg) for one week.  No sexual activity for 1 week.  Return to your normal activities as told by your health care provider. Ask your health care provider what activities are safe for you. General instructions Take over-the-counter and prescription medicines only as told by your health care provider. Do not use any products that contain nicotine or tobacco, such as cigarettes and e-cigarettes. If you need help quitting, ask your health care provider. You may shower after 24 hours, but Do not take baths, swim, or use a hot tub for 1 week.  Do not drink alcohol for 24 hours after your procedure. Keep all follow-up visits as told by your health care provider. This is important. Contact a health care provider if: You have redness, mild swelling, or pain around your puncture site. You have fluid or blood coming from your puncture site that stops after applying firm pressure to the area. Your puncture site feels warm to the touch. You have pus or a bad smell coming from your puncture site. You have a fever. You have chest pain or discomfort that spreads to your neck, jaw, or arm. You have chest pain that is worse with lying on your back or taking a deep breath. You are sweating a lot. You feel nauseous. You have a fast or irregular heartbeat. You have shortness of breath. You are dizzy or light-headed and feel the need to lie down. You have pain or numbness in the arm or leg closest to your puncture site.  Get help right away if: Your puncture site suddenly swells. Your puncture site is bleeding and the bleeding does not stop after applying firm pressure to the area. These symptoms may represent a serious problem that is an emergency. Do not wait to see if the symptoms will go away. Get medical help right away. Call your local emergency services (911 in the [place] Do not drive yourself to the hospital. Summary After the procedure, it  is normal to have bruising and tenderness at the puncture site in your groin, neck, or forearm. Check your puncture site every day for signs of infection. Get help right away if your puncture site is bleeding and the bleeding does not stop after applying firm pressure to the area. This is a medical emergency. This information is not intended to replace advice given to you by your health care provider. Make sure you discuss any questions you have with your health care provider.  ------------------------------------------------------------------------------------------------------------------------------------------------------------------   [facility] Procedure, Care After  Procedure MD: Dr. Alene Mires Clinical Coordinator: Karsten Fells, RN  This sheet gives you information about how to care for yourself after your procedure. Your health care provider may also give you more specific instructions. If you have problems or questions, contact your health care provider.  What can I expect after the procedure? After the procedure, it is common to have: Bruising around your puncture site. Tenderness around your puncture site. Tiredness (fatigue).  Medication instructions It is very important to continue to take your blood thinner as directed by your doctor after the Watchman procedure. Call your procedure doctor's office with question or concerns. If you are on Coumadin (warfarin), you will have your INR checked the week after your procedure, with a goal INR of 2.0 - 3.0. Please follow your medication instructions on your discharge summary. Only take the medications listed on your discharge paperwork.  Follow up You will be seen in 1 month after your procedure You will have a repeat CT scan approximately 8 weeks after your procedure mark to check your device You will follow up the MD/APP who performed your procedure 6 months after your procedure The Watchman Clinical Coordinator will check in  with you from time to time, including 1 and 2 years after your procedure.    Follow these instructions at home: Puncture site care  Follow instructions from your health care provider about how to take care of your puncture site. Make sure you: If present, leave stitches (sutures), skin glue, or adhesive strips in place.  If a large square bandage is present, this may be removed 24 hours after surgery.  Check your puncture site every day for signs of infection. Check for: Redness, swelling, or pain. Fluid or blood. If your puncture site starts to bleed, lie down on your back, apply firm pressure to the area, and contact your health care provider. Warmth. Pus or a bad smell. Driving Do not drive yourself home if you received sedation Do not drive for at least 4 days after your procedure or however long your health care provider recommends. (Do not resume driving if you have previously been instructed not to drive for other health reasons.) Do not spend greater than 1 hour at a time in a car for the first 3 days. Stop and take a break with a 5 minute walk at least every hour.  Do not drive or use heavy machinery while taking prescription pain medicine.  Activity Avoid activities that take a lot of effort, including exercise,  for at least 7 days after your procedure. For the first 3 days, avoid sitting for longer than one hour at a time.  Avoid alcoholic beverages, signing paperwork, or participating in legal proceedings for 24 hours after receiving sedation Do not lift anything that is heavier than 10 lb (4.5 kg) for one week.  No sexual activity for 1 week.  Return to your normal activities as told by your health care provider. Ask your health care provider what activities are safe for you. General instructions Take over-the-counter and prescription medicines only as told by your health care provider. Do not use any products that contain nicotine or tobacco, such as cigarettes and  e-cigarettes. If you need help quitting, ask your health care provider. You may shower after 24 hours, but Do not take baths, swim, or use a hot tub for 1 week.  Do not drink alcohol for 24 hours after your procedure. Keep all follow-up visits as told by your health care provider. This is important. Dental Work: You will require antibiotics prior to any dental work, including cleanings, for 6 months after your Watchman implantation to help protect you from infection. After 6 months, antibiotics are no longer required. Contact a health care provider if: You have redness, mild swelling, or pain around your puncture site. You have soreness in your throat or at your puncture site that does not improve after several days You have fluid or blood coming from your puncture site that stops after applying firm pressure to the area. Your puncture site feels warm to the touch. You have pus or a bad smell coming from your puncture site. You have a fever. You have chest pain or discomfort that spreads to your neck, jaw, or arm. You are sweating a lot. You feel nauseous. You have a fast or irregular heartbeat. You have shortness of breath. You are dizzy or light-headed and feel the need to lie down. You have pain or numbness in the arm or leg closest to your puncture site. Get help right away if: Your puncture site suddenly swells. Your puncture site is bleeding and the bleeding does not stop after applying firm pressure to the area. These symptoms may represent a serious problem that is an emergency. Do not wait to see if the symptoms will go away. Get medical help right away. Call your local emergency services (911 in the [place] Do not drive yourself to the hospital. Summary After the procedure, it is normal to have bruising and tenderness at the puncture site in your groin, neck, or forearm. Check your puncture site every day for signs of infection. Get help right away if your puncture site is bleeding  and the bleeding does not stop after applying firm pressure to the area. This is a medical emergency.  This information is not intended to replace advice given to you by your health care provider. Make sure you discuss any questions you have with your health care provider.

## 2023-02-21 NOTE — Progress Notes (Signed)
  [facility]    Patient doing well s/p AF ablation with concomitant LAAO. He is hemodynamically stable. Groin site with slight ooze however this has since stopped and site is very stable. Plan early ambulation after bedrest complete and discharge is groin site remains stable. Post-op instructions reviewed with understanding. He is scheduled for follow up 03/29/23 with [facility].   Georgie Chard NP-C Structural Heart Team  Phone: [phone]

## 2023-02-21 NOTE — Anesthesia Procedure Notes (Signed)
Procedure Name: Intubation Date/Time: 02/21/2023 10:07 AM  Performed by: Ayesha Rumpf, CRNAPre-anesthesia Checklist: Patient identified, Emergency Drugs available, Suction available and Patient being monitored Patient Re-evaluated:Patient Re-evaluated prior to induction Oxygen Delivery Method: Circle System Utilized Preoxygenation: Pre-oxygenation with 100% oxygen Induction Type: IV induction Ventilation: Oral airway inserted - appropriate to patient size and Two handed mask ventilation required Laryngoscope Size: Glidescope and 4 Grade View: Grade I Tube type: Oral Tube size: 8.0 mm Number of attempts: 1 Airway Equipment and Method: Stylet and Oral airway Placement Confirmation: ETT inserted through vocal cords under direct vision, positive ETCO2 and breath sounds checked- equal and bilateral Secured at: 23 cm Tube secured with: Tape Dental Injury: Teeth and Oropharynx as per pre-operative assessment  Difficulty Due To: Difficulty was anticipated and Difficult Airway- due to large tongue Comments: OAW, two handed mask ventilation necessary.

## 2023-02-21 NOTE — Progress Notes (Signed)
Patients dressing was soiled. Undressed groin site to evaluate. Site did pool with blood. Applied 10 minutes of pressure. No hematoma present patent does not report pain just sensitive. After 10 mins it appeared there may have been a small pocket of blood pooled just underneath skin, once expressed there was no more bleeding. New dressing applied site level 0.

## 2023-02-21 NOTE — Transfer of Care (Signed)
Immediate Anesthesia Transfer of Care Note  Patient: Bradley Rodriguez  Procedure(s) Performed: ATRIAL FIBRILLATION ABLATION LEFT ATRIAL APPENDAGE OCCLUSION TRANSESOPHAGEAL ECHOCARDIOGRAM  Patient Location: PACU and Cath Lab  Anesthesia Type:General  Level of Consciousness: awake, drowsy, patient cooperative, and responds to stimulation  Airway & Oxygen Therapy: Patient Spontanous Breathing and Patient connected to nasal cannula oxygen  Post-op Assessment: Report given to RN and Post -op Vital signs reviewed and stable  Post vital signs: Reviewed and stable  Last Vitals:  Vitals Value Taken Time  BP    Temp    Pulse    Resp    SpO2      Last Pain:  Vitals:   02/21/23 0752  PainSc: 0-No pain         Complications:  Encounter Notable Events  Notable Event Outcome Phase Comment  Difficult to intubate - expected  Intraprocedure Filed from anesthesia note documentation.

## 2023-02-22 ENCOUNTER — Inpatient Hospital Stay (HOSPITAL_COMMUNITY): Payer: Medicare HMO

## 2023-02-22 ENCOUNTER — Encounter (HOSPITAL_COMMUNITY): Payer: Self-pay | Admitting: Cardiology

## 2023-02-22 DIAGNOSIS — D509 Iron deficiency anemia, unspecified: Secondary | ICD-10-CM | POA: Diagnosis not present

## 2023-02-22 DIAGNOSIS — Z006 Encounter for examination for normal comparison and control in clinical research program: Secondary | ICD-10-CM | POA: Diagnosis not present

## 2023-02-22 DIAGNOSIS — I4819 Other persistent atrial fibrillation: Secondary | ICD-10-CM | POA: Diagnosis not present

## 2023-02-22 DIAGNOSIS — I724 Aneurysm of artery of lower extremity: Secondary | ICD-10-CM | POA: Diagnosis not present

## 2023-02-22 DIAGNOSIS — Z95818 Presence of other cardiac implants and grafts: Secondary | ICD-10-CM | POA: Diagnosis not present

## 2023-02-22 DIAGNOSIS — E119 Type 2 diabetes mellitus without complications: Secondary | ICD-10-CM | POA: Diagnosis not present

## 2023-02-22 DIAGNOSIS — S301XXA Contusion of abdominal wall, initial encounter: Secondary | ICD-10-CM | POA: Insufficient documentation

## 2023-02-22 MED ORDER — ATORVASTATIN CALCIUM 10 MG PO TABS
20.0000 mg | ORAL_TABLET | Freq: Every day | ORAL | Status: DC
Start: 2023-02-22 — End: 2023-02-23
  Administered 2023-02-22: 20 mg via ORAL
  Filled 2023-02-22: qty 2

## 2023-02-22 MED ORDER — APIXABAN 5 MG PO TABS
5.0000 mg | ORAL_TABLET | Freq: Two times a day (BID) | ORAL | Status: DC
  Administered 2023-02-22 – 2023-02-23 (×3): 5 mg via ORAL
  Filled 2023-02-22 (×3): qty 1

## 2023-02-22 MED ORDER — METOPROLOL TARTRATE 100 MG PO TABS
100.0000 mg | ORAL_TABLET | Freq: Two times a day (BID) | ORAL | Status: DC
  Administered 2023-02-22 – 2023-02-23 (×3): 100 mg via ORAL
  Filled 2023-02-22 (×3): qty 1

## 2023-02-22 MED ORDER — ATORVASTATIN CALCIUM 20 MG PO TABS: 20.0000 mg | ORAL_TABLET | Freq: Every day | ORAL | 1 refills | Status: AC

## 2023-02-22 MED ORDER — AMLODIPINE BESYLATE 5 MG PO TABS
5.0000 mg | ORAL_TABLET | Freq: Every evening | ORAL | Status: DC
  Administered 2023-02-22: 5 mg via ORAL
  Filled 2023-02-22: qty 1

## 2023-02-22 MED FILL — Fentanyl Citrate Preservative Free (PF) Inj 100 MCG/2ML: INTRAMUSCULAR | Qty: 2 | Status: AC

## 2023-02-22 NOTE — Progress Notes (Signed)
   Patient kept overnight after R groin hematoma noted after bedrest and ambulation complete prior to discharge. Stable this AM however will obtain R groin [name] prior to discharge today.   Georgie Chard NP-C Structural Heart Team  Pager: [phone] Phone: [phone]

## 2023-02-22 NOTE — Plan of Care (Signed)

## 2023-02-22 NOTE — Progress Notes (Signed)
Groin Pseudoaneurysm assessment completed. Please see CV Procedures for preliminary results.  Shona Simpson, RVT 02/22/23 9:52 AM

## 2023-02-22 NOTE — Anesthesia Postprocedure Evaluation (Signed)
Anesthesia Post Note  Patient: Bradley Rodriguez  Procedure(s) Performed: ATRIAL FIBRILLATION ABLATION LEFT ATRIAL APPENDAGE OCCLUSION TRANSESOPHAGEAL ECHOCARDIOGRAM     Patient location during evaluation: Cath Lab Anesthesia Type: General Level of consciousness: awake and alert Pain management: pain level controlled Vital Signs Assessment: post-procedure vital signs reviewed and stable Respiratory status: spontaneous breathing, nonlabored ventilation and respiratory function stable Cardiovascular status: blood pressure returned to baseline and stable Postop Assessment: no apparent nausea or vomiting Anesthetic complications: yes   Encounter Notable Events  Notable Event Outcome Phase Comment  Difficult to intubate - expected  Intraprocedure Filed from anesthesia note documentation.                    Tekila Caillouet

## 2023-02-22 NOTE — Progress Notes (Addendum)
[facility]   MULTIDISCIPLINARY HEART VALVE TEAM  Patient Name: Bradley Rodriguez Date of Encounter: 02/22/2023  Primary Cardiologist: Dina Rich, MD  Electrophysiologist: Nobie Putnam, MD  Hospital Problem List     Principal Problem:   Presence of Watchman left atrial appendage closure device Active Problems:   Persistent atrial fibrillation (HCC)   Hyperlipidemia   Iron deficiency anemia   Cancer of ascending colon (HCC)   Renal cell cancer, left (HCC)   Colon cancer (HCC)   Hypercoagulable state due to persistent atrial fibrillation (HCC)   Atrial fibrillation (HCC)   S/P ablation of atrial fibrillation   Hematoma of groin   Subjective   Patient feeling well with no SOB, chest pain. Having some mild right groin pain with palpation concerning for hematoma versus psuedo. See plan below    Inpatient Medications    Scheduled Meds:  amLODipine  5 mg Oral QPM   apixaban  5 mg Oral BID   iron polysaccharides  150 mg Oral Daily   metoprolol tartrate  100 mg Oral BID   sodium chloride flush  3 mL Intravenous Q12H   sodium chloride flush  3 mL Intravenous Q12H   Continuous Infusions:  sodium chloride     PRN Meds: sodium chloride, acetaminophen, ondansetron (ZOFRAN) IV, sodium chloride flush, sodium chloride flush   Vital Signs    Vitals:   02/21/23 2234 02/21/23 2329 02/22/23 0500 02/22/23 0847  BP: 133/78 (!) 142/86 (!) 152/83 (!) 149/96  Pulse:  81 73 80  Resp:  19 (!) 9 20  Temp:  98.7 F (37.1 C) 98.7 F (37.1 C) 99.2 F (37.3 C)  TempSrc:  Oral Oral Oral  SpO2:  97% 99%   Weight:      Height:        Intake/Output Summary (Last 24 hours) at 02/22/2023 1118 Last data filed at 02/22/2023 0847 Gross per 24 hour  Intake 1210 ml  Output 1240 ml  Net -30 ml   Filed Weights   02/21/23 0709  Weight: (!) 141.5 kg   Physical Exam   General: Well developed, well nourished, NAD Lungs:Clear to ausculation bilaterally. No wheezes, rales,  or rhonchi. Breathing is unlabored. Cardiovascular: RRR with S1 S2. No murmurs Extremities: No edema. Right groin with tender nodule. Neuro: Alert and oriented. No focal deficits. No facial asymmetry. MAE spontaneously. Psych: Responds to questions appropriately with normal affect.    Labs    CBC No results for input(s): "WBC", "NEUTROABS", "HGB", "HCT", "MCV", "PLT" in the last 72 hours. Basic Metabolic Panel No results for input(s): "NA", "K", "CL", "CO2", "GLUCOSE", "BUN", "CREATININE", "CALCIUM", "MG", "PHOS" in the last 72 hours. Liver Function Tests No results for input(s): "AST", "ALT", "ALKPHOS", "BILITOT", "PROT", "ALBUMIN" in the last 72 hours. No results for input(s): "LIPASE", "AMYLASE" in the last 72 hours. Cardiac Enzymes No results for input(s): "CKTOTAL", "CKMB", "CKMBINDEX", "TROPONINI" in the last 72 hours. BNP Invalid input(s): "POCBNP" D-Dimer No results for input(s): "DDIMER" in the last 72 hours. Hemoglobin A1C No results for input(s): "HGBA1C" in the last 72 hours. Fasting Lipid Panel No results for input(s): "CHOL", "HDL", "LDLCALC", "TRIG", "CHOLHDL", "LDLDIRECT" in the last 72 hours. Thyroid Function Tests No results for input(s): "TSH", "T4TOTAL", "T3FREE", "THYROIDAB" in the last 72 hours.  Invalid input(s): "FREET3"  Telemetry    02/22/23 NSR with intermittent PVC - Personally Reviewed  ECG    1/31/245 NSR with HR 75bpm - Personally Reviewed  Radiology  DG Chest 2 View Result Date: 02/22/2023 CLINICAL DATA:  Persistent atrial fibrillation. History of GI bleed. Hypercoagulable state due to persistent atrial fibrillation. Chronic iron deficiency. Preop exam. EXAM: CHEST - 2 VIEW COMPARISON:  Chest radiograph 12/11/2020 FINDINGS: The cardiomediastinal contours are normal. The lungs are clear. Pulmonary vasculature is normal. No consolidation, pleural effusion, or pneumothorax. No acute osseous abnormalities are seen. IMPRESSION: No active  cardiopulmonary disease. Electronically Signed   By: Narda Rutherford M.D.   On: 02/22/2023 10:31   EP STUDY Result Date: 02/21/2023 CONCLUSIONS: 1. Successful PVI 2. Successful ablation/isolation of the posterior wall 3. Intracardiac echo reveals normal LV size and function, trivial pericardial effusion, 4 distinct pulmonary veins. 4. Successful implantation of a 31mm WATCHMAN FLX PRO left atrial appendage occlusive device   5. No early apparent complications. Post Implant Anticoagulation Strategy: Continue Eliquis 5mg  twice daily for 3 months then transition to dual anti-platelet therapy. Nobie Putnam, MD Cardiac Electrophysiology   ECHO TEE Result Date: 02/21/2023    TRANSESOPHOGEAL ECHO REPORT   Patient Name:   Bradley Rodriguez Date of Exam: 02/21/2023 Medical Rec #:  [id number]     Height:       79.5 in Accession #:    [id number]    Weight:       312.0 lb Date of Birth:  1961/01/01    BSA:          2.765 m Patient Age:    63 years      BP:           145/87 mmHg Patient Gender: M             HR:           66 bpm. Exam Location:  Inpatient Procedure: Transesophageal Echo, 3D Echo, Color Doppler and Cardiac Doppler Indications:     I48.2 Chronic atrial fibrillation  History:         Patient has prior history of Echocardiogram examinations, most                  recent 07/30/2022. Arrythmias:Atrial Fibrillation; Risk                  Factors:Hypertension, Diabetes and Dyslipidemia.  Sonographer:     Irving Burton Senior RDCS Referring Phys:  [id number] Nobie Putnam Diagnosing Phys: Lennie Odor MD  Sonographer Comments: 31mm Watchman FLX implanted PROCEDURE: After discussion of the risks and benefits of a TEE, an informed consent was obtained from the patient. The transesophogeal probe was passed without difficulty through the esophogus of the patient. Sedation performed by different physician. The patient was monitored while under deep sedation. The patient developed no complications during the procedure.  IMPRESSIONS  1.  TEE guided LAA closure. 31 mm Watchman FLX placed with 25% compression. No device leak. Small iatrogenic ASD with L to R shunting after the procedure. Trivial pericardial effusion before and after the procedure. 3D echo confirmed device size and correct placement. No immediate complications.  2. Left ventricular ejection fraction, by estimation, is 50 to 55%. The left ventricle has low normal function.  3. Right ventricular systolic function is mildly reduced. The right ventricular size is mildly enlarged.  4. Left atrial size was mildly dilated. No left atrial/left atrial appendage thrombus was detected.  5. The mitral valve is grossly normal. Mild to moderate mitral valve regurgitation. No evidence of mitral stenosis.  6. The aortic valve is tricuspid. Aortic valve regurgitation is not visualized. No aortic stenosis is present.  7. There is mild (Grade II) layered plaque involving the descending aorta. FINDINGS  Left Ventricle: Left ventricular ejection fraction, by estimation, is 50 to 55%. The left ventricle has low normal function. The left ventricular internal cavity size was normal in size. Right Ventricle: The right ventricular size is mildly enlarged. No increase in right ventricular wall thickness. Right ventricular systolic function is mildly reduced. Left Atrium: Left atrial size was mildly dilated. No left atrial/left atrial appendage thrombus was detected. Right Atrium: Right atrial size was normal in size. Pericardium: Trivial pericardial effusion is present. Mitral Valve: The mitral valve is grossly normal. Mild to moderate mitral valve regurgitation. No evidence of mitral valve stenosis. Tricuspid Valve: The tricuspid valve is grossly normal. Tricuspid valve regurgitation is mild . No evidence of tricuspid stenosis. Aortic Valve: The aortic valve is tricuspid. Aortic valve regurgitation is not visualized. No aortic stenosis is present. Pulmonic Valve: The pulmonic valve was grossly normal. Pulmonic  valve regurgitation is trivial. No evidence of pulmonic stenosis. Aorta: The aortic root and ascending aorta are structurally normal, with no evidence of dilitation. There is mild (Grade II) layered plaque involving the descending aorta. Venous: The left upper pulmonary vein, left lower pulmonary vein, right lower pulmonary vein and right upper pulmonary vein are normal. IAS/Shunts: The atrial septum is grossly normal. Additional Comments: Spectral Doppler performed.  AORTA Ao Root diam: 3.81 cm Ao Asc diam:  3.65 cm Lennie Odor MD Electronically signed by Lennie Odor MD Signature Date/Time: 02/21/2023/12:51:30 PM    Final    Cardiac Studies   Cardiac Studies & Procedures       TEE  ECHO TEE 02/21/2023  Narrative TRANSESOPHOGEAL ECHO REPORT    Patient Name:   EMERSON SCHREIFELS Date of Exam: 02/21/2023 Medical Rec #:  [id number]     Height:       79.5 in Accession #:    [id number]    Weight:       312.0 lb Date of Birth:  [birth year]    BSA:          2.765 m Patient Age:    63 years      BP:           145/87 mmHg Patient Gender: M             HR:           66 bpm. Exam Location:  Inpatient  Procedure: Transesophageal Echo, 3D Echo, Color Doppler and Cardiac Doppler  Indications:     I48.2 Chronic atrial fibrillation  History:         Patient has prior history of Echocardiogram examinations, most recent 07/30/2022. Arrythmias:Atrial Fibrillation; Risk Factors:Hypertension, Diabetes and Dyslipidemia.  Sonographer:     Irving Burton Senior RDCS Referring Phys:  [id number] Nobie Putnam Diagnosing Phys: Lennie Odor MD   Sonographer Comments: 31mm Watchman FLX implanted   PROCEDURE: After discussion of the risks and benefits of a TEE, an informed consent was obtained from the patient. The transesophogeal probe was passed without difficulty through the esophogus of the patient. Sedation performed by different physician. The patient was monitored while under deep sedation. The patient developed no  complications during the procedure.  IMPRESSIONS   1. TEE guided LAA closure. 31 mm Watchman FLX placed with 25% compression. No device leak. Small iatrogenic ASD with L to R shunting after the procedure. Trivial pericardial effusion before and after the procedure. 3D echo confirmed device size and correct placement. No immediate complications. 2. Left  ventricular ejection fraction, by estimation, is 50 to 55%. The left ventricle has low normal function. 3. Right ventricular systolic function is mildly reduced. The right ventricular size is mildly enlarged. 4. Left atrial size was mildly dilated. No left atrial/left atrial appendage thrombus was detected. 5. The mitral valve is grossly normal. Mild to moderate mitral valve regurgitation. No evidence of mitral stenosis. 6. The aortic valve is tricuspid. Aortic valve regurgitation is not visualized. No aortic stenosis is present. 7. There is mild (Grade II) layered plaque involving the descending aorta.  FINDINGS Left Ventricle: Left ventricular ejection fraction, by estimation, is 50 to 55%. The left ventricle has low normal function. The left ventricular internal cavity size was normal in size.  Right Ventricle: The right ventricular size is mildly enlarged. No increase in right ventricular wall thickness. Right ventricular systolic function is mildly reduced.  Left Atrium: Left atrial size was mildly dilated. No left atrial/left atrial appendage thrombus was detected.  Right Atrium: Right atrial size was normal in size.  Pericardium: Trivial pericardial effusion is present.  Mitral Valve: The mitral valve is grossly normal. Mild to moderate mitral valve regurgitation. No evidence of mitral valve stenosis.  Tricuspid Valve: The tricuspid valve is grossly normal. Tricuspid valve regurgitation is mild . No evidence of tricuspid stenosis.  Aortic Valve: The aortic valve is tricuspid. Aortic valve regurgitation is not visualized. No aortic  stenosis is present.  Pulmonic Valve: The pulmonic valve was grossly normal. Pulmonic valve regurgitation is trivial. No evidence of pulmonic stenosis.  Aorta: The aortic root and ascending aorta are structurally normal, with no evidence of dilitation. There is mild (Grade II) layered plaque involving the descending aorta.  Venous: The left upper pulmonary vein, left lower pulmonary vein, right lower pulmonary vein and right upper pulmonary vein are normal.  IAS/Shunts: The atrial septum is grossly normal.  Additional Comments: Spectral Doppler performed.   AORTA Ao Root diam: 3.81 cm Ao Asc diam:  3.65 cm  Lennie Odor MD Electronically signed by Lennie Odor MD Signature Date/Time: 02/21/2023/12:51:30 PM    Final            Patient Profile     MATEUSZ NEILAN is a 63 y.o. male with a history of HTN, HLD, DM, colon cancer, and difficult to control atrial fibrillation who was referred to Dr. Jimmey Ralph for AF ablation and concomitant LAAO with Watchman placement.   Assessment & Plan    PAF: s/p AF ablation and concomitant LAAO with 31mm Watchman FLX Pro on 02/21/23. Patient kept overnight due to oozing groin. Seen today with tenderness and right groin nodule concerning for pseudoaneurysm. Awaiting final read on imaging. Plan for pressure dressing application while waiting with bedrest. If read with no psuedo, plan manual compression and keep overnight. If imaging concerning will consult VVS for potential US guided thrombin. Eliquis restarted this AM.   Right groin hematoma: Concern for pseudoaneurysm. Awaiting formal US read. Called [name] team for expedited interpretation. LVM and awaiting call back. Will place pressure dressing for now and keep on bedrest.  ADDEND: VAS [name] with no evidence of pseudoaneurysm, AVF, or DVT. There is an avascular hematoma seen superficial to the distal SFA measuring 2.0x2.74cm. Pressure dressing applied with orders for bedrest. Dr. Jimmey Ralph notified.   HTN:  Stable with no change needed at this time.   SignedGeorgie Chard, NP  02/22/2023, 11:18 AM  Pager [phone]

## 2023-02-22 NOTE — Progress Notes (Signed)
RN checked pt's groin sites at end time of bedrest prior to ambulation and found hematoma on R groin. Cath lab called and pressure held until they arrived. Cath lab continued to hold pressure and applied pressure dressing. Pt and RN instructed to continue bedrest overnight and leave on pressure dressing until MD rounds in the morning. Report/instructions passed off to night shift RN.

## 2023-02-22 NOTE — Progress Notes (Signed)
Pt's oxygen saturation dropped to 77-80's on RA while asleep. Pt placed on supplemental O2 at 2L per [place]. MD on call updated. Will continue to monitor pt.

## 2023-02-22 NOTE — TOC CM/SW Note (Signed)
Transition of Care [facility]) - Inpatient Brief Assessment   Patient Details  Name: Bradley Rodriguez MRN: [id number] Date of Birth: 17-Jul-1960  Transition of Care [facility] CM/SW Contact:    Gala Lewandowsky, RN Phone Number: 02/22/2023, 1:53 PM   Clinical Narrative: Patient presented for AF ablation. PTA patient was from home with family support. Patient has insurance and PCP. No home needs identified at this time.    Transition of Care Asessment: Insurance and Status: Insurance coverage has been reviewed Patient has primary care physician: Yes  Prior/Current Home Services: No current home services Social Drivers of Health Review: SDOH reviewed no interventions necessary Readmission risk has been reviewed: Yes Transition of care needs: no transition of care needs at this time

## 2023-02-23 DIAGNOSIS — Z95818 Presence of other cardiac implants and grafts: Secondary | ICD-10-CM | POA: Diagnosis not present

## 2023-02-23 DIAGNOSIS — I4819 Other persistent atrial fibrillation: Secondary | ICD-10-CM | POA: Diagnosis not present

## 2023-02-23 NOTE — Plan of Care (Signed)
  Problem: Education: Goal: Knowledge of cardiac device and self-care will improve Outcome: Adequate for Discharge Goal: Ability to safely manage health related needs after discharge will improve Outcome: Adequate for Discharge Goal: Individualized Educational Video(s) Outcome: Adequate for Discharge   Problem: Cardiac: Goal: Ability to achieve and maintain adequate cardiopulmonary perfusion will improve Outcome: Adequate for Discharge   Problem: Education: Goal: Knowledge of General Education information will improve Description: Including pain rating scale, medication(s)/side effects and non-pharmacologic comfort measures Outcome: Adequate for Discharge   Problem: Health Behavior/Discharge Planning: Goal: Ability to manage health-related needs will improve Outcome: Adequate for Discharge   Problem: Clinical Measurements: Goal: Ability to maintain clinical measurements within normal limits will improve Outcome: Adequate for Discharge Goal: Will remain free from infection Outcome: Adequate for Discharge Goal: Diagnostic test results will improve Outcome: Adequate for Discharge Goal: Respiratory complications will improve Outcome: Adequate for Discharge Goal: Cardiovascular complication will be avoided Outcome: Adequate for Discharge   Problem: Activity: Goal: Risk for activity intolerance will decrease Outcome: Adequate for Discharge   Problem: Nutrition: Goal: Adequate nutrition will be maintained Outcome: Adequate for Discharge   Problem: Coping: Goal: Level of anxiety will decrease Outcome: Adequate for Discharge   Problem: Elimination: Goal: Will not experience complications related to bowel motility Outcome: Adequate for Discharge Goal: Will not experience complications related to urinary retention Outcome: Adequate for Discharge   Problem: Pain Managment: Goal: General experience of comfort will improve and/or be controlled Outcome: Adequate for Discharge    Problem: Safety: Goal: Ability to remain free from injury will improve Outcome: Adequate for Discharge   Problem: Skin Integrity: Goal: Risk for impaired skin integrity will decrease Outcome: Adequate for Discharge   Problem: Education: Goal: Understanding of disease, treatment, and recovery process will improve Outcome: Adequate for Discharge   Problem: Activity: Goal: Ability to return to baseline activity level will improve Outcome: Adequate for Discharge   Problem: Cardiac: Goal: Ability to maintain adequate cardiovascular perfusion will improve Outcome: Adequate for Discharge Goal: Vascular access site(s) Level 0-1 will be maintained Outcome: Adequate for Discharge   Problem: Health Behavior/ Discharge Planning: Goal: Ability to safely manage health related needs after discharge Outcome: Adequate for Discharge

## 2023-02-23 NOTE — Progress Notes (Signed)
   Patient Name: Bradley Rodriguez Date of Encounter: 02/23/2023 [facility] Cardiologist: Dina Rich, MD   Interval Summary  .    No acute overnight events. No significant pain in R groin.  Vital Signs .    Vitals:   02/22/23 1928 02/22/23 2007 02/22/23 2105 02/23/23 0611  BP: (!) 147/84 (!) 147/84 (!) 109/57 131/78  Pulse: 77  75 67  Resp: 20   19  Temp: 99.1 F (37.3 C)   98.7 F (37.1 C)  TempSrc: Oral   Oral  SpO2: 98%   100%  Weight:      Height:        Intake/Output Summary (Last 24 hours) at 02/23/2023 0831 Last data filed at 02/22/2023 1722 Gross per 24 hour  Intake 600 ml  Output --  Net 600 ml      02/21/2023    7:09 AM 02/01/2023   10:55 AM 12/25/2022   11:50 AM  Last 3 Weights  Weight (lbs) 312 lb 322 lb 3.2 oz 313 lb  Weight (kg) 141.522 kg 146.149 kg 141.976 kg      Telemetry/ECG    Sinus rhythm - Personally Reviewed  Physical Exam .   GEN: No acute distress.   Neck: No JVD Cardiac: Normal rate, regular rhythm Respiratory: Clear to auscultation bilaterally. GI: Soft, non-distended  R Groin: small hematoma, stable in size, less tender L Groin: soft, no hematoma MS: No edema  Assessment & Plan .     Assessment: Bradley Rodriguez is a 63 y.o. male with a history of HTN, HLD, DM, colon cancer, and difficult to control atrial fibrillation who was referred to Dr. Jimmey Ralph for AF ablation and concomitant LAAO with Watchman placement.   Patient kept due to right groin hematoma. Vascular ultrasound confirming presence of avascular hematoma with no vascular injury, pseudoaneurysm, AVF, or DVT.   #Persistent atrial fibrillation s/p PVI and Watchman - Continue Eliquis for 90 days. Then transition to DAPT.  - Continue home metoprolol.  - CT scan to assess Watchman implant in 60 days.  - Follow up with EP APP in clinic in 7-10 days to assess groin site.   For questions or updates, please contact [facility] Please consult [email or web address]  for contact info under        Signed, Nobie Putnam, MD

## 2023-02-23 NOTE — Plan of Care (Signed)
  Problem: Activity: Goal: Ability to return to baseline activity level will improve Outcome: Adequate for Discharge   Problem: Cardiac: Goal: Ability to maintain adequate cardiovascular perfusion will improve Outcome: Adequate for Discharge Goal: Vascular access site(s) Level 0-1 will be maintained Outcome: Adequate for Discharge

## 2023-02-25 ENCOUNTER — Telehealth: Payer: Self-pay

## 2023-02-25 NOTE — Telephone Encounter (Signed)
  [facility]   Watchman Team  Contacted the patient regarding discharge from [facility] on 02/23/2023  The patient understands to follow up with Canary Brim on 03/04/2023  The patient understands discharge instructions? Yes  The patient understands medications and regimen? Yes   The patient reports groin site looks bruised but improving. There is no swelling, bleeding or S/S of infection.  The patient understands to call with any questions or concerns prior to scheduled visit.

## 2023-03-01 DIAGNOSIS — K580 Irritable bowel syndrome with diarrhea: Secondary | ICD-10-CM | POA: Diagnosis not present

## 2023-03-01 DIAGNOSIS — N1831 Chronic kidney disease, stage 3a: Secondary | ICD-10-CM | POA: Diagnosis not present

## 2023-03-01 DIAGNOSIS — Z6834 Body mass index (BMI) 34.0-34.9, adult: Secondary | ICD-10-CM | POA: Diagnosis not present

## 2023-03-01 DIAGNOSIS — E782 Mixed hyperlipidemia: Secondary | ICD-10-CM | POA: Diagnosis not present

## 2023-03-03 NOTE — Progress Notes (Signed)
  Electrophysiology Office Note:   Date:  03/04/2023  ID:  Bradley Rodriguez, DOB 1960-06-23, MRN [id number]  Primary Cardiologist: Armida Lander, MD Primary Heart Failure: None Electrophysiologist: Ardeen Kohler, MD      History of Present Illness:   Bradley Rodriguez is a 63 y.o. male with h/o AF s/p ablation & LAA closure / Watchman seen today for routine electrophysiology followup.   Since last being seen in our clinic the patient reports doing very well. He reports he is no longer fatigued and feels much better overall.  He reports his right groin is still a little sore and he has bruising to the right thigh.  He denies chest pain, palpitations, dyspnea, PND, orthopnea, nausea, vomiting, dizziness, syncope, edema, weight gain, or early satiety.   Review of systems complete and found to be negative unless listed in HPI.   EP Information / Studies Reviewed:    EKG is not ordered today. EKG from 02/22/23 reviewed which showed NSR 75 bpm      Studies:  TEE 02/21/23 > LVEF 50-55%, LA mildly dilated,     Arrhythmia / AAD AF  AF ablation & LAA Occlusion 02/20/22   Risk Assessment/Calculations:    CHA2DS2-VASc Score = 2   This indicates a 2.2% annual risk of stroke. The patient's score is based upon: CHF History: 0 HTN History: 1 Diabetes History: 1 Stroke History: 0 Vascular Disease History: 0 Age Score: 0 Gender Score: 0             Physical Exam:   VS:  BP 138/82   Pulse 60   Ht 6\' 8"  (2.032 m)   Wt (!) 317 lb 3.2 oz (143.9 kg)   SpO2 96%   BMI 34.85 kg/m    Wt Readings from Last 3 Encounters:  03/04/23 (!) 317 lb 3.2 oz (143.9 kg)  02/21/23 (!) 312 lb (141.5 kg)  02/01/23 (!) 322 lb 3.2 oz (146.1 kg)    Wound Check Exam:  GEN: Well nourished, well developed in no acute distress, wife at bedside  CARDIAC: Right groin site well healed, nickel sized area that is firm to palpation at insertion site, no erythema, purple/green & yellow faded bruising to R thigh > in  varying stages of healing   ASSESSMENT AND PLAN:    Persistent Atrial Fibrillation s/p Ablation and Watchman Pt monitored overnight post procedure for right groin hematoma. Vascular US  showed presence of avascular hematoma with no vascular injury, pseudoaneurysm, AVF or DVT.   -post procedure sites healing well  -continue metoprolol  -previously arranged CT scan to assess Watchman in 60 days -90 days of Eliquis  post procedure, then transition to DAPT  Secondary Hypercoagulable State  -continue Eliquis  5mg  BID, dose reviewed and appropriate by age/wt   Follow up with Dr. Daneil Dunker as usual post procedure, f/u appts in place  Signed, Creighton Doffing, NP-C, AGACNP-BC [facility] - Electrophysiology  03/04/2023, 4:07 PM

## 2023-03-04 ENCOUNTER — Ambulatory Visit: Payer: Medicare HMO | Attending: Pulmonary Disease | Admitting: Pulmonary Disease

## 2023-03-04 ENCOUNTER — Encounter: Payer: Self-pay | Admitting: Pulmonary Disease

## 2023-03-04 VITALS — BP 138/82 | HR 60 | Ht >= 80 in | Wt 317.2 lb

## 2023-03-04 DIAGNOSIS — I4819 Other persistent atrial fibrillation: Secondary | ICD-10-CM

## 2023-03-04 DIAGNOSIS — Z95818 Presence of other cardiac implants and grafts: Secondary | ICD-10-CM | POA: Diagnosis not present

## 2023-03-04 DIAGNOSIS — E782 Mixed hyperlipidemia: Secondary | ICD-10-CM | POA: Diagnosis not present

## 2023-03-04 DIAGNOSIS — D6869 Other thrombophilia: Secondary | ICD-10-CM | POA: Diagnosis not present

## 2023-03-04 DIAGNOSIS — E559 Vitamin D deficiency, unspecified: Secondary | ICD-10-CM | POA: Diagnosis not present

## 2023-03-04 DIAGNOSIS — E7849 Other hyperlipidemia: Secondary | ICD-10-CM | POA: Diagnosis not present

## 2023-03-04 DIAGNOSIS — E875 Hyperkalemia: Secondary | ICD-10-CM | POA: Diagnosis not present

## 2023-03-04 DIAGNOSIS — I10 Essential (primary) hypertension: Secondary | ICD-10-CM | POA: Diagnosis not present

## 2023-03-04 DIAGNOSIS — D649 Anemia, unspecified: Secondary | ICD-10-CM | POA: Diagnosis not present

## 2023-03-04 DIAGNOSIS — E1165 Type 2 diabetes mellitus with hyperglycemia: Secondary | ICD-10-CM | POA: Diagnosis not present

## 2023-03-04 DIAGNOSIS — R5383 Other fatigue: Secondary | ICD-10-CM | POA: Diagnosis not present

## 2023-03-04 NOTE — Patient Instructions (Signed)
 Medication Instructions:  Your physician recommends that you continue on your current medications as directed. Please refer to the Current Medication list given to you today.  *If you need a refill on your cardiac medications before your next appointment, please call your pharmacy*  Lab Work: None ordered If you have labs (blood work) drawn today and your tests are completely normal, you will receive your results only by: MyChart Message (if you have MyChart) OR A paper copy in the mail If you have any lab test that is abnormal or we need to change your treatment, we will call you to review the results.  Follow-Up: At [facility], you and your health needs are our priority.  As part of our continuing mission to provide you with exceptional heart care, we have created designated Provider Care Teams.  These Care Teams include your primary Cardiologist (physician) and Advanced Practice Providers (APPs -  Physician Assistants and Nurse Practitioners) who all work together to provide you with the care you need, when you need it.  Your next appointment:   Keep follow up appointments as scheduled

## 2023-03-11 DIAGNOSIS — N1831 Chronic kidney disease, stage 3a: Secondary | ICD-10-CM | POA: Diagnosis not present

## 2023-03-11 DIAGNOSIS — E782 Mixed hyperlipidemia: Secondary | ICD-10-CM | POA: Diagnosis not present

## 2023-03-11 DIAGNOSIS — Z6835 Body mass index (BMI) 35.0-35.9, adult: Secondary | ICD-10-CM | POA: Diagnosis not present

## 2023-03-11 DIAGNOSIS — K580 Irritable bowel syndrome with diarrhea: Secondary | ICD-10-CM | POA: Diagnosis not present

## 2023-03-18 ENCOUNTER — Telehealth: Payer: Self-pay | Admitting: Cardiology

## 2023-03-18 NOTE — Telephone Encounter (Signed)
 Called patient back about his message. Patient stated he started having symptoms of A. FIB yesterday and it has continued on this morning. Patient's HR is staying to the 70's to 80's. He has an appointment with A. FIB clinic next Friday. Will send  message to Canary Brim, NP, who he saw last and see if she has any advisement for him.

## 2023-03-18 NOTE — Telephone Encounter (Signed)
 Patient c/o Palpitations:  High priority if patient c/o lightheadedness, shortness of breath, or chest pain  How long have you had palpitations/irregular HR/ Afib? Are you having the symptoms now? yes  Are you currently experiencing lightheadedness, SOB or CP? no  Do you have a history of afib (atrial fibrillation) or irregular heart rhythm? yes  Have you checked your BP or HR? (document readings if available): HR 70/80  Are you experiencing any other symptoms? Fatigue

## 2023-03-19 NOTE — Telephone Encounter (Signed)
 Sent message through [place] that provider sent.

## 2023-03-25 ENCOUNTER — Encounter (HOSPITAL_COMMUNITY): Payer: Self-pay

## 2023-03-25 ENCOUNTER — Emergency Department (HOSPITAL_COMMUNITY)

## 2023-03-25 ENCOUNTER — Other Ambulatory Visit: Payer: Self-pay

## 2023-03-25 ENCOUNTER — Inpatient Hospital Stay (HOSPITAL_COMMUNITY)
Admission: EM | Admit: 2023-03-25 | Discharge: 2023-04-01 | DRG: 281 | Disposition: A | Discharge status: Home / Self Care | Attending: Cardiology | Admitting: Cardiology

## 2023-03-25 DIAGNOSIS — Z9049 Acquired absence of other specified parts of digestive tract: Secondary | ICD-10-CM | POA: Diagnosis not present

## 2023-03-25 DIAGNOSIS — I2510 Atherosclerotic heart disease of native coronary artery without angina pectoris: Secondary | ICD-10-CM | POA: Diagnosis present

## 2023-03-25 DIAGNOSIS — Z800 Family history of malignant neoplasm of digestive organs: Secondary | ICD-10-CM

## 2023-03-25 DIAGNOSIS — I10 Essential (primary) hypertension: Secondary | ICD-10-CM | POA: Diagnosis not present

## 2023-03-25 DIAGNOSIS — E1122 Type 2 diabetes mellitus with diabetic chronic kidney disease: Secondary | ICD-10-CM | POA: Diagnosis present

## 2023-03-25 DIAGNOSIS — E785 Hyperlipidemia, unspecified: Secondary | ICD-10-CM | POA: Diagnosis present

## 2023-03-25 DIAGNOSIS — F419 Anxiety disorder, unspecified: Secondary | ICD-10-CM | POA: Diagnosis present

## 2023-03-25 DIAGNOSIS — K589 Irritable bowel syndrome without diarrhea: Secondary | ICD-10-CM | POA: Diagnosis present

## 2023-03-25 DIAGNOSIS — I700 Atherosclerosis of aorta: Secondary | ICD-10-CM | POA: Diagnosis not present

## 2023-03-25 DIAGNOSIS — Z6834 Body mass index (BMI) 34.0-34.9, adult: Secondary | ICD-10-CM

## 2023-03-25 DIAGNOSIS — K760 Fatty (change of) liver, not elsewhere classified: Secondary | ICD-10-CM | POA: Diagnosis not present

## 2023-03-25 DIAGNOSIS — Z79899 Other long term (current) drug therapy: Secondary | ICD-10-CM

## 2023-03-25 DIAGNOSIS — I361 Nonrheumatic tricuspid (valve) insufficiency: Secondary | ICD-10-CM | POA: Diagnosis not present

## 2023-03-25 DIAGNOSIS — Z7982 Long term (current) use of aspirin: Secondary | ICD-10-CM | POA: Diagnosis not present

## 2023-03-25 DIAGNOSIS — Z85528 Personal history of other malignant neoplasm of kidney: Secondary | ICD-10-CM | POA: Diagnosis not present

## 2023-03-25 DIAGNOSIS — Z888 Allergy status to other drugs, medicaments and biological substances status: Secondary | ICD-10-CM

## 2023-03-25 DIAGNOSIS — I340 Nonrheumatic mitral (valve) insufficiency: Secondary | ICD-10-CM | POA: Diagnosis present

## 2023-03-25 DIAGNOSIS — Z8249 Family history of ischemic heart disease and other diseases of the circulatory system: Secondary | ICD-10-CM | POA: Diagnosis not present

## 2023-03-25 DIAGNOSIS — I21A1 Myocardial infarction type 2: Secondary | ICD-10-CM | POA: Diagnosis present

## 2023-03-25 DIAGNOSIS — C189 Malignant neoplasm of colon, unspecified: Secondary | ICD-10-CM | POA: Diagnosis not present

## 2023-03-25 DIAGNOSIS — Z801 Family history of malignant neoplasm of trachea, bronchus and lung: Secondary | ICD-10-CM

## 2023-03-25 DIAGNOSIS — E66811 Obesity, class 1: Secondary | ICD-10-CM | POA: Diagnosis present

## 2023-03-25 DIAGNOSIS — I4811 Longstanding persistent atrial fibrillation: Secondary | ICD-10-CM | POA: Diagnosis present

## 2023-03-25 DIAGNOSIS — T82867A Thrombosis of cardiac prosthetic devices, implants and grafts, initial encounter: Secondary | ICD-10-CM | POA: Diagnosis present

## 2023-03-25 DIAGNOSIS — R160 Hepatomegaly, not elsewhere classified: Secondary | ICD-10-CM | POA: Diagnosis not present

## 2023-03-25 DIAGNOSIS — Z833 Family history of diabetes mellitus: Secondary | ICD-10-CM

## 2023-03-25 DIAGNOSIS — I4891 Unspecified atrial fibrillation: Secondary | ICD-10-CM | POA: Diagnosis present

## 2023-03-25 DIAGNOSIS — Z7901 Long term (current) use of anticoagulants: Secondary | ICD-10-CM

## 2023-03-25 DIAGNOSIS — N182 Chronic kidney disease, stage 2 (mild): Secondary | ICD-10-CM | POA: Diagnosis present

## 2023-03-25 DIAGNOSIS — I129 Hypertensive chronic kidney disease with stage 1 through stage 4 chronic kidney disease, or unspecified chronic kidney disease: Secondary | ICD-10-CM | POA: Diagnosis present

## 2023-03-25 DIAGNOSIS — Z85038 Personal history of other malignant neoplasm of large intestine: Secondary | ICD-10-CM | POA: Diagnosis not present

## 2023-03-25 DIAGNOSIS — I4819 Other persistent atrial fibrillation: Secondary | ICD-10-CM | POA: Diagnosis not present

## 2023-03-25 DIAGNOSIS — I214 Non-ST elevation (NSTEMI) myocardial infarction: Secondary | ICD-10-CM | POA: Diagnosis not present

## 2023-03-25 DIAGNOSIS — Z905 Acquired absence of kidney: Secondary | ICD-10-CM | POA: Diagnosis not present

## 2023-03-25 DIAGNOSIS — R079 Chest pain, unspecified: Secondary | ICD-10-CM | POA: Diagnosis not present

## 2023-03-25 DIAGNOSIS — K219 Gastro-esophageal reflux disease without esophagitis: Secondary | ICD-10-CM | POA: Diagnosis present

## 2023-03-25 DIAGNOSIS — Y712 Prosthetic and other implants, materials and accessory cardiovascular devices associated with adverse incidents: Secondary | ICD-10-CM | POA: Diagnosis present

## 2023-03-25 DIAGNOSIS — E119 Type 2 diabetes mellitus without complications: Secondary | ICD-10-CM | POA: Diagnosis not present

## 2023-03-25 DIAGNOSIS — R7989 Other specified abnormal findings of blood chemistry: Secondary | ICD-10-CM | POA: Diagnosis not present

## 2023-03-25 DIAGNOSIS — D6869 Other thrombophilia: Secondary | ICD-10-CM | POA: Diagnosis present

## 2023-03-25 LAB — CBC
HCT: 40.7 % (ref 39.0–52.0)
Hemoglobin: 13.6 g/dL (ref 13.0–17.0)
MCH: 30.1 pg (ref 26.0–34.0)
MCHC: 33.4 g/dL (ref 30.0–36.0)
MCV: 90 fL (ref 80.0–100.0)
Platelets: 277 10*3/uL (ref 150–400)
RBC: 4.52 MIL/uL (ref 4.22–5.81)
RDW: 13.5 % (ref 11.5–15.5)
WBC: 8.9 10*3/uL (ref 4.0–10.5)
nRBC: 0 % (ref 0.0–0.2)

## 2023-03-25 LAB — TROPONIN I (HIGH SENSITIVITY)
Troponin I (High Sensitivity): 12 ng/L (ref <18)
Troponin I (High Sensitivity): 82 ng/L — ABNORMAL HIGH (ref <18)
Troponin I (High Sensitivity): 430 ng/L (ref <18)
Troponin I (High Sensitivity): 749 ng/L (ref <18)

## 2023-03-25 LAB — BASIC METABOLIC PANEL
Anion gap: 14 (ref 5–15)
BUN: 21 mg/dL (ref 8–23)
CO2: 23 mmol/L (ref 22–32)
Calcium: 9.5 mg/dL (ref 8.9–10.3)
Chloride: 102 mmol/L (ref 98–111)
Creatinine, Ser: 1.22 mg/dL (ref 0.61–1.24)
GFR, Estimated: >60 mL/min (ref >60)
Glucose, Bld: 185 mg/dL — ABNORMAL HIGH (ref 70–99)
Potassium: 4.9 mmol/L (ref 3.5–5.1)
Sodium: 139 mmol/L (ref 135–145)

## 2023-03-25 MED ORDER — NITROGLYCERIN 0.4 MG SL SUBL: 0.4000 mg | SUBLINGUAL_TABLET | SUBLINGUAL | Status: DC | PRN

## 2023-03-25 MED ORDER — ACETAMINOPHEN 325 MG PO TABS: 650.0000 mg | ORAL_TABLET | ORAL | Status: DC | PRN

## 2023-03-25 MED ORDER — METOPROLOL TARTRATE 50 MG PO TABS
100.0000 mg | ORAL_TABLET | Freq: Two times a day (BID) | ORAL | Status: DC
  Administered 2023-03-25 – 2023-03-29 (×8): 100 mg via ORAL
  Filled 2023-03-25 (×2): qty 2
  Filled 2023-03-25 (×2): qty 4
  Filled 2023-03-25 (×4): qty 2

## 2023-03-25 MED ORDER — HEPARIN (PORCINE) 25000 UT/250ML-% IV SOLN
2300.0000 [IU]/h | INTRAVENOUS | Status: DC
  Administered 2023-03-25: 1700 [IU]/h via INTRAVENOUS
  Administered 2023-03-26: 2100 [IU]/h via INTRAVENOUS
  Filled 2023-03-25 (×2): qty 250

## 2023-03-25 MED ORDER — ASPIRIN 81 MG PO CHEW
324.0000 mg | CHEWABLE_TABLET | ORAL | Status: AC
  Administered 2023-03-25: 324 mg via ORAL
  Filled 2023-03-25: qty 4

## 2023-03-25 MED ORDER — ASPIRIN 300 MG RE SUPP: 300.0000 mg | RECTAL | Status: DC

## 2023-03-25 MED ORDER — ASPIRIN 81 MG PO CHEW
81.0000 mg | CHEWABLE_TABLET | Freq: Every day | ORAL | Status: DC
  Administered 2023-03-26 – 2023-04-01 (×7): 81 mg via ORAL
  Filled 2023-03-25 (×7): qty 1

## 2023-03-25 MED ORDER — ATORVASTATIN CALCIUM 10 MG PO TABS
20.0000 mg | ORAL_TABLET | Freq: Every day | ORAL | Status: DC
  Administered 2023-03-25 – 2023-03-31 (×7): 20 mg via ORAL
  Filled 2023-03-25 (×7): qty 2

## 2023-03-25 MED ORDER — METOPROLOL TARTRATE 25 MG PO TABS: 100.0000 mg | ORAL_TABLET | Freq: Two times a day (BID) | ORAL | Status: DC

## 2023-03-25 MED ORDER — DULOXETINE HCL 60 MG PO CPEP
60.0000 mg | ORAL_CAPSULE | Freq: Every morning | ORAL | Status: DC
  Administered 2023-03-26 – 2023-04-01 (×7): 60 mg via ORAL
  Filled 2023-03-25 (×4): qty 1
  Filled 2023-03-25: qty 2
  Filled 2023-03-25 (×2): qty 1

## 2023-03-25 MED ORDER — APIXABAN 5 MG PO TABS: 5.0000 mg | ORAL_TABLET | Freq: Two times a day (BID) | ORAL | Status: DC

## 2023-03-25 NOTE — Progress Notes (Signed)
 PHARMACY - ANTICOAGULATION CONSULT NOTE  Pharmacy Consult for UFH IV Infusion Indication: atrial fibrillation  Allergies  Allergen Reactions   Ace Inhibitors Cough   Cozaar [Losartan Potassium] Other (See Comments)    Hyperkalemia    Xarelto [Rivaroxaban] Other (See Comments)    Heart out of rhythm    Zestril [Lisinopril] Cough   Quinine Derivatives Rash    Patient Measurements: Height: 6\' 8"  (203.2 cm) Weight: (!) 138.3 kg (305 lb) IBW/kg (Calculated) : 96 Heparin Dosing Weight: 138.3 kg  Vital Signs: Temp: 98.1 F (36.7 C) (03/03 1455) BP: 112/71 (03/03 1902) Pulse Rate: 105 (03/03 1902)  Labs: Recent Labs    03/25/23 0941 03/25/23 1129 03/25/23 1748  HGB 13.6  --   --   HCT 40.7  --   --   PLT 277  --   --   CREATININE 1.22  --   --   TROPONINIHS 12 82* 430*    Estimated Creatinine Clearance: 97.7 mL/min (by C-G formula based on SCr of 1.22 mg/dL).   Medical History: Past Medical History:  Diagnosis Date   A-fib [facility])    DCCV 2009, 2012, 2014, Nov 2016   Anxiety    Arthritis    Knees, wrist, ankles   Cancer (HCC) 07/2019   Kidney   Colon cancer (HCC)    Diabetes mellitus without complication (HCC)    Dysrhythmia 1996   A- Fib   GERD (gastroesophageal reflux disease)    History of kidney stones    HX: anticoagulation    very remotely and briefly on COumadin aound one of his CV, 2014 on Eliquis had hematuria with renal calculi and stopped   Hyperlipidemia    patient denies this   Hypertension    IBS (irritable bowel syndrome)    Normal cardiac stress test    Normal nuclear stress test , 2001   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 Jimmey Ralph.   S/P ablation of atrial fibrillation 02/21/2023   With Dr. Jimmey Ralph    Medications:  (Not in a hospital admission)  Scheduled:   [START ON 03/26/2023] aspirin  81 mg Oral Daily   atorvastatin   20 mg Oral QHS   [START ON 03/26/2023] DULoxetine  60 mg Oral q AM   metoprolol tartrate  100 mg Oral BID    Assessment: 62 YOM visit ER with c/o Afib, dizziness, and DOE. Denies CP. Patient on Eliquis PTA for AF. CBC is stable. No signs of bleeding per RN.  Goal of Therapy:  Heparin level 0.3-0.7 units/ml aPTT 66-102 seconds Monitor platelets by anticoagulation protocol: Yes   Plan:  Start heparin infusion at 1700 units/hr Check aPTT level in 6 hours and daily while on heparin Continue to monitor H&H and platelets  Wilmer Floor, PharmD PGY2 Cardiology Pharmacy Resident 03/25/2023,9:31 PM

## 2023-03-25 NOTE — ED Provider Notes (Signed)
 Care of patient assumed from Dr. Particia Nearing.  This patient underwent a Watchman procedure 1 month ago.  He has continued atrial fibrillation with RVR, worse with standing.  Cardiology has been consulted and will see.  Troponin increased from 12 to 82 while in the ED. Physical Exam  BP (!) 131/92   Pulse (!) 107   Temp 98.1 F (36.7 C)   Resp 16   Ht 6\' 8"  (2.032 m)   Wt (!) 138.3 kg   SpO2 100%   BMI 33.51 kg/m   Physical Exam Vitals and nursing note reviewed.  Constitutional:      General: He is not in acute distress.    Appearance: He is well-developed. He is not ill-appearing, toxic-appearing or diaphoretic.  HENT:     Head: Normocephalic and atraumatic.  Eyes:     Conjunctiva/sclera: Conjunctivae normal.  Cardiovascular:     Rate and Rhythm: Tachycardia present. Rhythm irregular.     Heart sounds: No murmur heard. Pulmonary:     Effort: Pulmonary effort is normal. No respiratory distress.  Abdominal:     Palpations: Abdomen is soft.  Musculoskeletal:        General: No swelling. Normal range of motion.     Cervical back: Normal range of motion and neck supple.  Skin:    General: Skin is warm and dry.  Neurological:     General: No focal deficit present.     Mental Status: He is alert and oriented to person, place, and time.  Psychiatric:        Mood and Affect: Mood normal.        Behavior: Behavior normal.     Procedures  Procedures  ED Course / MDM    Medical Decision Making Amount and/or Complexity of Data Reviewed Labs: ordered. Radiology: ordered.  Risk Decision regarding hospitalization.   On assessment, patient resting comfortably.  Currently, he is in atrial fibrillation with RVR, while at rest.  Will continue to trend troponins.  Cardiology evaluated and did admit to their service.       Gloris Manchester, MD 03/25/23 [phone]

## 2023-03-25 NOTE — H&P (View-Only) (Signed)
 H&P   Patient ID: Bradley Rodriguez MRN: [id number]; DOB: 10-Jun-1960  Admit date: 03/25/2023 Date of Consult: 03/25/2023  PCP:  Richardean Chimera, MD   [facility] Providers Cardiologist:  Dina Rich, MD  Electrophysiologist:  Nobie Putnam, MD  {   Patient Profile:   Bradley Rodriguez is a 63 y.o. male with a hx of HTN, HLD, DM, colon Ca/GIB  who is being seen 03/25/2023 for the evaluation of CP, AFib post AFib ablation/watchman at the request of Dr. Particia Nearing.  AFib  Starts 1996 Failed multaq/amiodarone Failed OAC with GIBs  History of Present Illness:   Bradley Rodriguez underwent AFib ablation with watchman implant 02/21/23 with dr. Jimmey Ralph, he did developed post procedure a small R sided groin hematoma monitored with vascular US and with stability in size, no aneurysm/pseudo  discharged 02/24/23 on Eliquis for 3 months, then transition to dual antiplatelet therapy with aspirin and Plavix   He saw B. Ollis, NP 03/04/23, feeling well, much better overall, R groin bruised but stable  Called the office 2/24, back in AFib, rate controlled  Comes to the ER today woth c/o AFib, dizzy spells, resting rates ok, elevated with exertion, denied CP He reports that he is well aware of his AFib feels it, feels generally fatigued, poorly. He came in today because this morning when he was getting up and around he felt off balance, (not lightheaded, not near syncopal), but so much so that he was uncomfortable driving his grandchild to school. He does have a chest symptom, not pain, but a vague awareness, sense of "something" Had the same symptom for a couple days post procedure, worried the watchman may have dislodged  He is feeling better currently, aware of his Afib, but no ongoing sense of dizziness  LABS K+ 4.9 BUN/Creat 21/1.22 HS Trop 12 >> 82 WBC 8.9 H/h 13/40 Plts 277  No meds given here in the ER He did take his morning medications this morning at home, he and his wife are certain,  no missed eliquis doses post procedure, taking it BOD  Home rate control is lopressor 100mg  BID   Past Medical History:  Diagnosis Date   A-fib [facility])    DCCV 2009, 2012, 2014, Nov 2016   Anxiety    Arthritis    Knees, wrist, ankles   Cancer (HCC) 07/2019   Kidney   Colon cancer (HCC)    Diabetes mellitus without complication (HCC)    Dysrhythmia 1996   A- Fib   GERD (gastroesophageal reflux disease)    History of kidney stones    HX: anticoagulation    very remotely and briefly on COumadin aound one of his CV, 2014 on Eliquis had hematuria with renal calculi and stopped   Hyperlipidemia    patient denies this   Hypertension    IBS (irritable bowel syndrome)    Normal cardiac stress test    Normal nuclear stress test , 2001   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 Jimmey Ralph.   S/P ablation of atrial fibrillation 02/21/2023   With Dr. Jimmey Ralph    Past Surgical History:  Procedure Laterality Date   ATRIAL FIBRILLATION ABLATION N/A 02/21/2023   Procedure: ATRIAL FIBRILLATION ABLATION;  Surgeon: Nobie Putnam, MD;  Location: [facility] INVASIVE CV LAB;  Service: Cardiovascular;  Laterality: N/A;   BIOPSY  08/19/2020   Procedure: BIOPSY;  Surgeon: Dolores Frame,  MD;  Location: [facility] ENDO SU[facility];  Service: Gastroenterology;;   CARDIOVERSION N/A 10/15/2012   Procedure: CARDIOVERSION;  Surgeon: Laurey Morale, MD;  Location: [facility] ENDOSCOPY;  Service: Cardiovascular;  Laterality: N/A;   CARDIOVERSION N/A 11/26/2014   Procedure: CARDIOVERSION;  Surgeon: Antoine Poche, MD;  Location: [facility] ORS;  Service: Endoscopy;  Laterality: N/A;   CARDIOVERSION N/A 11/15/2015   Procedure: CARDIOVERSION;  Surgeon: Jake Bathe, MD;  Location: [facility] ENDOSCOPY;  Service: Cardiovascular;  Laterality: N/A;   CARDIOVERSION N/A 08/07/2016   Procedure: CARDIOVERSION;  Surgeon: Laqueta Linden, MD;   Location: [facility] ORS;  Service: Cardiovascular;  Laterality: N/A;   CARDIOVERSION N/A 09/11/2018   Procedure: CARDIOVERSION;  Surgeon: Antoine Poche, MD;  Location: [facility] ORS;  Service: Endoscopy;  Laterality: N/A;   CARDIOVERSION N/A 01/03/2021   Procedure: CARDIOVERSION;  Surgeon: Thomasene Ripple, DO;  Location: [facility] ENDOSCOPY;  Service: Cardiovascular;  Laterality: N/A;   CARDIOVERSION N/A 07/30/2022   Procedure: CARDIOVERSION;  Surgeon: Thurmon Fair, MD;  Location: [facility] INVASIVE CV LAB;  Service: Cardiovascular;  Laterality: N/A;   CARDIOVERSION N/A 11/20/2022   Procedure: CARDIOVERSION;  Surgeon: Thomasene Ripple, DO;  Location: [facility] INVASIVE CV LAB;  Service: Cardiovascular;  Laterality: N/A;   CARDIOVERSION N/A 01/04/2023   Procedure: CARDIOVERSION;  Surgeon: Wendall Stade, MD;  Location: [facility] INVASIVE CV LAB;  Service: Cardiovascular;  Laterality: N/A;   CATARACT EXTRACTION W/PHACO Right 01/18/2014   Procedure: CATARACT EXTRACTION PHACO AND INTRAOCULAR LENS PLACEMENT (IOC);  Surgeon: Gemma Payor, MD;  Location: [facility] ORS;  Service: Ophthalmology;  Laterality: Right;  CDE 9.95   COLONOSCOPY WITH PROPOFOL N/A 08/19/2020   Procedure: COLONOSCOPY WITH PROPOFOL;  Surgeon: Dolores Frame, MD;  [facility];  Service: Gastroenterology;  Laterality: N/A;  8:20   COLONOSCOPY WITH PROPOFOL N/A 11/15/2021   Procedure: COLONOSCOPY WITH PROPOFOL;  Surgeon: Dolores Frame, MD;  [facility]: [facility];  Service: Gastroenterology;  Laterality: N/A;  130 ASA 2   CYSTOSCOPY WITH LITHOLAPAXY N/A 10/06/2019   Procedure: CYSTOSCOPY WITH LASER LITHOLAPAXY;  Surgeon: Rene Paci, MD;  Location: [facility] ORS;  Service: Urology;  Laterality: N/A;   ESOPHAGOGASTRODUODENOSCOPY (EGD) WITH PROPOFOL N/A 08/19/2020   Procedure: ESOPHAGOGASTRODUODENOSCOPY (EGD) WITH PROPOFOL;  Surgeon: Dolores Frame, MD;  [facility];  Service: Gastroenterology;  Laterality: N/A;   EYE  SURGERY Right    detached retina   FOOT SURGERY Left    KNEE ARTHROSCOPY Right    LAPAROSCOPIC RIGHT HEMI COLECTOMY N/A 09/30/2020   Procedure: LAPAROSCOPIC RIGHT HEMI COLECTOMY;  Surgeon: Andria Meuse, MD;  Location: [facility] ORS;  Service: General;  Laterality: N/A;   LEFT ATRIAL APPENDAGE OCCLUSION N/A 02/21/2023   Procedure: LEFT ATRIAL APPENDAGE OCCLUSION;  Surgeon: Nobie Putnam, MD;  Location: [facility] INVASIVE CV LAB;  Service: Cardiovascular;  Laterality: N/A;   LITHOTRIPSY     for kidney stones 2002   POLYPECTOMY  08/19/2020   Procedure: POLYPECTOMY INTESTINAL;  Surgeon: Dolores Frame, MD;  [facility];  Service: Gastroenterology;;   POLYPECTOMY  08/19/2020   Procedure: POLYPECTOMY;  Surgeon: Dolores Frame, MD;  [facility];  Service: Gastroenterology;;   POLYPECTOMY  11/15/2021   Procedure: POLYPECTOMY;  Surgeon: Dolores Frame, MD;  [facility];  Service: Gastroenterology;;   ROBOT ASSISTED LAPAROSCOPIC NEPHRECTOMY Left 10/06/2019   Procedure: XI ROBOTIC ASSISTED LAPAROSCOPIC NEPHRECTOMY WITH ADRENALECTOMY;  Surgeon: Rene Paci, MD;  Location: [facility] ORS;  Service: Urology;  Laterality: Left;   SUBMUCOSAL TATTOO INJECTION  08/19/2020   Procedure: SUBMUCOSAL TATTOO INJECTION;  Surgeon: Dolores Frame, MD;  [facility];  Service: Gastroenterology;;   TEE WITHOUT CARDIOVERSION N/A 11/26/2014   Procedure: TRANSESOPHAGEAL ECHOCARDIOGRAM (TEE) WITH PROPOFOL;  Surgeon: Antoine Poche, MD;  Location: [facility];  Service: Endoscopy;  Laterality: N/A;   TEE WITHOUT CARDIOVERSION N/A 11/15/2015   Procedure: TRANSESOPHAGEAL ECHOCARDIOGRAM (TEE);  Surgeon: Jake Bathe, MD;  Location: [facility] ENDOSCOPY;  Service: Cardiovascular;  Laterality: N/A;   TEE WITHOUT CARDIOVERSION N/A 09/11/2018   Procedure: TRANSESOPHAGEAL ECHOCARDIOGRAM (TEE) WITH PROPOFOL;  Surgeon: Antoine Poche, MD;  Location: [facility];   Service: Endoscopy;  Laterality: N/A;   TEE WITHOUT CARDIOVERSION N/A 01/03/2021   Procedure: TRANSESOPHAGEAL ECHOCARDIOGRAM (TEE);  Surgeon: Thomasene Ripple, DO;  Location: [facility] ENDOSCOPY;  Service: Cardiovascular;  Laterality: N/A;   TEE WITHOUT CARDIOVERSION N/A 07/30/2022   Procedure: TRANSESOPHAGEAL ECHOCARDIOGRAM;  Surgeon: Thurmon Fair, MD;  Location: [facility] INVASIVE CV LAB;  Service: Cardiovascular;  Laterality: N/A;   TRANSESOPHAGEAL ECHOCARDIOGRAM (CATH LAB) N/A 02/21/2023   Procedure: TRANSESOPHAGEAL ECHOCARDIOGRAM;  Surgeon: Nobie Putnam, MD;  Location: [facility] INVASIVE CV LAB;  Service: Cardiovascular;  Laterality: N/A;   VASECTOMY       Home Medications:  Prior to Admission medications   Medication Sig Start Date End Date Taking? Authorizing Provider  amLODipine (NORVASC) 5 MG tablet Take 1 tablet (5 mg total) by mouth daily. Patient taking differently: Take 5 mg by mouth every evening. 06/27/22   Sharlene Dory, NP  apixaban (ELIQUIS) 5 MG TABS tablet Take 1 tablet (5 mg total) by mouth 2 (two) times daily. 07/23/22   Eustace Pen, PA-C  atorvastatin (LIPITOR) 20 MG tablet Take 1 tablet (20 mg total) by mouth at bedtime. 02/22/23 08/21/23  Nobie Putnam, MD  DULoxetine (CYMBALTA) 60 MG capsule Take 60 mg by mouth in the morning. 09/06/21   [provider]  iron polysaccharides (NIFEREX) 150 MG capsule Take 150 mg by mouth 2 (two) times daily.    [provider]  MAGNESIUM GLYCINATE PO Take 420 mg by mouth at bedtime.    [provider]  metoprolol tartrate (LOPRESSOR) 100 MG tablet TAKE ONE TABLET BY MOUTH TWICE DAILY 12/19/22   Sharlene Dory, NP  OVER THE COUNTER MEDICATION Take 1 tablet by mouth 2 (two) times daily. Glucocil OTC supplement for blood sugar    [provider]  Vitamin D, Ergocalciferol, (DRISDOL) 1.25 MG (50000 UNIT) CAPS capsule Take 50,000 Units by mouth every Sunday.    [provider]  ZINC GLUCONATE PO Take 1 tablet by  mouth daily.    [provider]  prochlorperazine (COMPAZINE) 10 MG tablet Take 1 tablet (10 mg total) by mouth every 6 (six) hours as needed (Nausea or vomiting). 10/24/20 07/01/21  Doreatha Massed, MD    Inpatient Medications: Scheduled Meds:  Continuous Infusions:  PRN Meds:   Allergies:    Allergies  Allergen Reactions   Ace Inhibitors Cough   Lisinopril Cough   Xarelto [Rivaroxaban] Other (See Comments)    Heart out of rhythm    Quinine Derivatives Rash    Social History:   Social History   Socioeconomic History   Marital status: Married    Spouse name: Not on file   Number of children: 2   Years of education: Not on file   Highest education level: Not on file  Occupational History   Not on file  Tobacco Use  Smoking status: Never   Smokeless tobacco: Never  Vaping Use   Vaping status: Never Used  Substance and Sexual Activity   Alcohol use: No    Alcohol/week: 0.0 standard drinks of alcohol   Drug use: No   Sexual activity: Yes    Birth control/protection: Surgical  Other Topics Concern   Not on file  Social History Narrative   Not on file   Social Drivers of Health   Financial Resource Strain: Low Risk  (09/01/2020)   Overall Financial Resource Strain (CARDIA)    Difficulty of Paying Living Expenses: Not hard at all  Food Insecurity: No Food Insecurity (02/22/2023)   Hunger Vital Sign    Worried About Running Out of Food in the Last Year: Never true    Ran Out of Food in the Last Year: Never true  Transportation Needs: No Transportation Needs (02/22/2023)   PRAPARE - Administrator, Civil Service (Medical): No    Lack of Transportation (Non-Medical): No  Physical Activity: Inactive (09/01/2020)   Exercise Vital Sign    Days of Exercise per Week: 0 days    Minutes of Exercise per Session: 0 min  Stress: No Stress Concern Present (09/01/2020)   [employer] of Occupational Health - Occupational Stress Questionnaire     Feeling of Stress : Not at all  Social Connections: Moderately Integrated (09/01/2020)   Social Connection and Isolation Panel [NHANES]    Frequency of Communication with Friends and Family: More than three times a week    Frequency of Social Gatherings with Friends and Family: More than three times a week    Attends Religious Services: More than 4 times per year    Active Member of [employer] or Organizations: No    Attends Banker Meetings: Never    Marital Status: Married  Catering manager Violence: Not At Risk (02/22/2023)   Humiliation, Afraid, Rape, and Kick questionnaire    Fear of Current or Ex-Partner: No    Emotionally Abused: No    Physically Abused: No    Sexually Abused: No    Family History:   Family History  Problem Relation Age of Onset   Diabetes Mother    Hypertension Mother    Colon cancer Mother        dx. 60s   Diabetes Father    Colon cancer Father        dx. 60s   Lung cancer Maternal Grandmother        smoked   Bone cancer Maternal Grandfather        dx. 80s   Hypertension Other      ROS:  Please see the history of present illness.  All other ROS reviewed and negative.     Physical Exam/Data:   Vitals:   03/25/23 1236 03/25/23 1455 03/25/23 1539 03/25/23 1615  BP: 130/82  132/85 (!) 131/92  Pulse: 82  (!) 118 (!) 107  Resp: 20  17 16   Temp:  98.1 F (36.7 C)    TempSrc:      SpO2: 100%  99% 100%  Weight:      Height:       No intake or output data in the 24 hours ending 03/25/23 1629    03/25/2023    9:28 AM 03/04/2023    3:23 PM 02/21/2023    7:09 AM  Last 3 Weights  Weight (lbs) 305 lb 317 lb 3.2 oz 312 lb  Weight (kg) 138.347 kg 143.881 kg  141.522 kg     Body mass index is 33.51 kg/m.  General:  Well nourished, well developed, in no acute distress HEENT: normal Neck: no JVD Vascular: No carotid bruits; Distal pulses 2+ bilaterally Cardiac:  irreg-irreg; no murmurs, gallops or rubs Lungs:  CTA b/l, no wheezing, rhonchi or  rales  Abd: soft, nontender Ext: no edema Musculoskeletal:  No deformities Skin: warm and dry  Neuro:  no focal abnormalities noted Psych:  Normal affect   EKG:  The EKG was personally reviewed and demonstrates:    AFib 105bpm, no acute/ischemic looking changes   Telemetry:  Telemetry was personally reviewed and demonstrates:   AFib 100's mostly > 120's  Relevant CV Studies:   02/21/23: EPS/ablation/watchman CONCLUSIONS: 1. Successful PVI 2. Successful ablation/isolation of the posterior wall 3. Intracardiac echo reveals normal LV size and function, trivial pericardial effusion, 4 distinct pulmonary veins. 4. Successful implantation of a 31mm WATCHMAN FLX PRO left atrial appendage occlusive device    5. No early apparent complications.   Post Implant Anticoagulation Strategy: Continue Eliquis 5mg  twice daily for 3 months then transition to dual anti-platelet therapy.     02/21/23: TEE  1. TEE guided LAA closure. 31 mm Watchman FLX placed with 25%  compression. No device leak. Small iatrogenic ASD with L to R shunting  after the procedure. Trivial pericardial effusion before and after the  procedure. 3D echo confirmed device size and  correct placement. No immediate complications.   2. Left ventricular ejection fraction, by estimation, is 50 to 55%. The  left ventricle has low normal function.   3. Right ventricular systolic function is mildly reduced. The right  ventricular size is mildly enlarged.   4. Left atrial size was mildly dilated. No left atrial/left atrial  appendage thrombus was detected.   5. The mitral valve is grossly normal. Mild to moderate mitral valve  regurgitation. No evidence of mitral stenosis.   6. The aortic valve is tricuspid. Aortic valve regurgitation is not  visualized. No aortic stenosis is present.   7. There is mild (Grade II) layered plaque involving the descending  aorta.    02/01/23: Cardiac CT IMPRESSION: 1. The left atrial appendage  is a large chicken wing morphology without thrombus.   2. A 31 mm Watchman FLX device is recommended based on the above landing zone measurements.   3. A mid/mid IAS puncture site is recommended.   4. Optimal deployment angle: RAO 10 CRA 15   5. Small PFO.   6. CAC score of 253, which is 73rd percentile for age-, sex-, and race-matched controls.   7. Mildly dilated aortic root up to 43 mm.  Laboratory Data:  High Sensitivity Troponin:   Recent Labs  Lab 03/25/23 0941 03/25/23 1129  TROPONINIHS 12 82*     Chemistry Recent Labs  Lab 03/25/23 0941  NA 139  K 4.9  CL 102  CO2 23  GLUCOSE 185*  BUN 21  CREATININE 1.22  CALCIUM 9.5  GFRNONAA >60  ANIONGAP 14    No results for input(s): "PROT", "ALBUMIN", "AST", "ALT", "ALKPHOS", "BILITOT" in the last 168 hours. Lipids No results for input(s): "CHOL", "TRIG", "HDL", "LABVLDL", "LDLCALC", "CHOLHDL" in the last 168 hours.  Hematology Recent Labs  Lab 03/25/23 0941  WBC 8.9  RBC 4.52  HGB 13.6  HCT 40.7  MCV 90.0  MCH 30.1  MCHC 33.4  RDW 13.5  PLT 277   Thyroid No results for input(s): "TSH", "FREET4" in the last 168  hours.  BNPNo results for input(s): "BNP", "PROBNP" in the last 168 hours.  DDimer No results for input(s): "DDIMER" in the last 168 hours.   Radiology/Studies:  DG Chest 2 View Result Date: 03/25/2023 CLINICAL DATA:  Chest pain EXAM: CHEST - 2 VIEW COMPARISON:  X-ray 02/21/2023 FINDINGS: The right inferior costophrenic angle is clipped off the edge of the film. No consolidation, pneumothorax or effusion. No edema. Normal cardiopericardial silhouette. Calcified aorta. Dense tiny right apical lung nodule, based on appearance likely calcified and related to old granulomatous disease or other process. IMPRESSION: No acute cardiopulmonary disease. The right inferior costophrenic angle is clipped off the edge of the film. Electronically Signed   By: Karen Kays M.D.   On: 03/25/2023 11:16      Assessment and Plan:   Persistent Afib CHA2DS2Vasc is 2, on eliquis, appropriately dosed Not entirely rate controlled Not unusual post ablation  Dizzy More so off balance when up and around this morning Doesn't drink much water/or anything really Labs look OK  Chest symptom Denies this as a chest pain Similar symptom for 2-3 days post procedure HS Trop behavior, unusual at  12 >> 82  Will review with Dr. Jimmey Ralph Do we need to follow Trops?  He has some coronary ca on his CT, though quite atypical sounding TEE/DCCV? DCCV in the ER?  D/w Dr. Jimmey Ralph ADDEND: Will bring him in for observation Likely plan cardiac CT for coronaries and check watchman  #3 trop is drawn/running ASA ordered for now      Risk Assessment/Risk Scores:    For questions or updates, please contact [facility] Please consult [email or web address] for contact info under    Signed, Sheilah Pigeon, PA-C  03/25/2023 4:29 PM   I have seen, examined the patient, and reviewed the above assessment and plan.    HPI: Bradley Rodriguez is a 63 y.o. male with a hx of HTN, HLD, DM, colon Ca/GIB, and persistent atrial fibrillation status post concomitant catheter ablation and watchman implant on 02/21/2023.  Patient states that he did very well for the first 24 days after his catheter ablation.  He states that he had more energy during this time and less shortness of breath.  On 03/18/2023 unfortunately he went back into atrial fibrillation; this was confirmed by his Kardia device.  Since then he has had more fatigue and shortness of breath but was not significantly limiting.  Today, however, while driving his grandchild to school he developed what he calls unsteadiness, associated with some dizziness and feeling off balance.  He has also had some mild chest discomfort.  He presented to the ED for evaluation and electrophysiology was consulted.  GEN: No acute distress.   Cardiac: Tachycardic, regular. Resp:  Normal work of breathing.  R groin: Soft, without hematoma, bleeding or ecchymosis. Ext: No edema.  Neuro: No gross focal deficits. Psych: Normal affect   Troponin: 12 -> 82 -> 430  EKG: AF at 105bpm  Assessment and plan:  #. Persistent atrial fibrillation: Has history of longstanding persistent atrial fibrillation and has failed antiarrhythmic drug therapy previously.  Maintained sinus rhythm for approximately 24 days after catheter ablation.  Unfortunately now with early recurrence. -We likely will pursue TEE and cardioversion after left heart catheterization.  TEE due to presence of Watchman device for only 1 month.  Need to rule out device related thrombus prior to cardioversion. -We will start antiarrhythmic drug therapy after cardioversion.  Regimen will be based on results of  left heart catheterization (present/extent of coronary disease). -No missed doses of Eliquis.  Start IV heparin.  #. NSTEMI: On initial presentation his troponin was normal then up trended significantly. Patient has been in atrial fibrillation for greater than 1 week.  Degree of troponin elevation seems out of proportion to his atrial fibrillation.  He has coronary calcification on CT. Troponin elevation is also in the setting of normal renal function. -Patient was given aspirin 325 mg x 1.  Will start aspirin 81 mg once daily. -We will hold additional doses of Eliquis and start IV heparin. -Plan for left heart catheterization tomorrow.  Nobie Putnam, MD 03/25/2023 11:19 PM

## 2023-03-25 NOTE — ED Provider Notes (Signed)
 [facility] Provider Note   CSN: [id number] Arrival date & time: 03/25/23  8657     History  Chief Complaint  Patient presents with   Chest Pain    Bradley Rodriguez is a 63 y.o. male.  Pt is a 63 yo male with pmhx significant for afib (on eliquis), htn, hld, anxiety, IBS, kidney stones, kidney and colon cancer, and gerd.  On 1/30, he underwent a watchman placement.  He has remained in afib and has been having intermittent dizzy spells.  HR is ok as long as he's sitting still, but it goes up with ambulation.  He denies any cp.          Home Medications Prior to Admission medications   Medication Sig Start Date End Date Taking? Authorizing Provider  amLODipine (NORVASC) 5 MG tablet Take 1 tablet (5 mg total) by mouth daily. Patient taking differently: Take 5 mg by mouth every evening. 06/27/22   Sharlene Dory, NP  apixaban (ELIQUIS) 5 MG TABS tablet Take 1 tablet (5 mg total) by mouth 2 (two) times daily. 07/23/22   Eustace Pen, PA-C  atorvastatin (LIPITOR) 20 MG tablet Take 1 tablet (20 mg total) by mouth at bedtime. 02/22/23 08/21/23  Nobie Putnam, MD  DULoxetine (CYMBALTA) 60 MG capsule Take 60 mg by mouth in the morning. 09/06/21   [provider]  iron polysaccharides (NIFEREX) 150 MG capsule Take 150 mg by mouth 2 (two) times daily.    [provider]  MAGNESIUM GLYCINATE PO Take 420 mg by mouth at bedtime.    [provider]  metoprolol tartrate (LOPRESSOR) 100 MG tablet TAKE ONE TABLET BY MOUTH TWICE DAILY 12/19/22   Sharlene Dory, NP  OVER THE COUNTER MEDICATION Take 1 tablet by mouth 2 (two) times daily. Glucocil OTC supplement for blood sugar    [provider]  Vitamin D, Ergocalciferol, (DRISDOL) 1.25 MG (50000 UNIT) CAPS capsule Take 50,000 Units by mouth every Sunday.    [provider]  ZINC GLUCONATE PO Take 1 tablet by mouth daily.    [provider]   prochlorperazine (COMPAZINE) 10 MG tablet Take 1 tablet (10 mg total) by mouth every 6 (six) hours as needed (Nausea or vomiting). 10/24/20 07/01/21  Doreatha Massed, MD      Allergies    Ace inhibitors, Lisinopril, Xarelto [rivaroxaban], and Quinine derivatives    Review of Systems   Review of Systems  Cardiovascular:  Positive for palpitations.  Neurological:  Positive for dizziness.  All other systems reviewed and are negative.   Physical Exam Updated Vital Signs BP (!) 131/92   Pulse (!) 107   Temp 98.1 F (36.7 C)   Resp 16   Ht 6\' 8"  (2.032 m)   Wt (!) 138.3 kg   SpO2 100%   BMI 33.51 kg/m  Physical Exam Vitals and nursing note reviewed.  Constitutional:      Appearance: He is well-developed.  HENT:     Head: Normocephalic and atraumatic.  Eyes:     Extraocular Movements: Extraocular movements intact.     Pupils: Pupils are equal, round, and reactive to light.  Cardiovascular:     Rate and Rhythm: Tachycardia present. Rhythm irregular.     Heart sounds: Normal heart sounds.  Pulmonary:     Effort: Pulmonary effort is normal.     Breath sounds: Normal breath sounds.  Abdominal:     General: Bowel sounds are normal.  Palpations: Abdomen is soft.  Musculoskeletal:        General: Normal range of motion.     Cervical back: Normal range of motion and neck supple.  Skin:    General: Skin is warm.     Capillary Refill: Capillary refill takes less than 2 seconds.  Neurological:     General: No focal deficit present.     Mental Status: He is alert and oriented to person, place, and time.  Psychiatric:        Mood and Affect: Mood normal.        Behavior: Behavior normal.     ED Results / Procedures / Treatments   Labs (all labs ordered are listed, but only abnormal results are displayed) Labs Reviewed  BASIC METABOLIC PANEL - Abnormal; Notable for the following components:      Result Value   Glucose, Bld 185 (*)    All other components within  normal limits  TROPONIN I (HIGH SENSITIVITY) - Abnormal; Notable for the following components:   Troponin I (High Sensitivity) 82 (*)    All other components within normal limits  CBC  TROPONIN I (HIGH SENSITIVITY)    EKG EKG Interpretation Date/Time:  Monday March 25 2023 09:32:38 EST Ventricular Rate:  105 PR Interval:    QRS Duration:  96 QT Interval:  342 QTC Calculation: 452 R Axis:   72  Text Interpretation: Atrial fibrillation with rapid ventricular response Abnormal ECG When compared with ECG of 22-Feb-2023 04:56, PREVIOUS ECG IS PRESENT Confirmed by Jacalyn Lefevre [id number]) on 03/25/2023 11:50:41 AM  Radiology DG Chest 2 View Result Date: 03/25/2023 CLINICAL DATA:  Chest pain EXAM: CHEST - 2 VIEW COMPARISON:  X-ray 02/21/2023 FINDINGS: The right inferior costophrenic angle is clipped off the edge of the film. No consolidation, pneumothorax or effusion. No edema. Normal cardiopericardial silhouette. Calcified aorta. Dense tiny right apical lung nodule, based on appearance likely calcified and related to old granulomatous disease or other process. IMPRESSION: No acute cardiopulmonary disease. The right inferior costophrenic angle is clipped off the edge of the film. Electronically Signed   By: Karen Kays M.D.   On: 03/25/2023 11:16    Procedures Procedures    Medications Ordered in ED Medications - No data to display  ED Course/ Medical Decision Making/ A&P                                 Medical Decision Making Amount and/or Complexity of Data Reviewed Labs: ordered. Radiology: ordered.   This patient presents to the ED for concern of palpitations/dizziness, this involves an extensive number of treatment options, and is a complaint that carries with it a high risk of complications and morbidity.  The differential diagnosis includes afib with rvr, electrolyte abn, anemia   Co morbidities that complicate the patient evaluation  afib (on eliquis), htn, hld, anxiety,  IBS, kidney stones, kidney and colon cancer, and gerd   Additional history obtained:  Additional history obtained from epic chart review External records from outside source obtained and reviewed including wife   Lab Tests:  I Ordered, and personally interpreted labs.  The pertinent results include:  cbc nl, bmp nl other than bs elevated at 185, initial trop 12, repeat 82   Imaging Studies ordered:  I ordered imaging studies including cxr  I independently visualized and interpreted imaging which showed  No acute cardiopulmonary disease. The right inferior costophrenic  angle  is clipped off the edge of the film.   I agree with the radiologist interpretation   Cardiac Monitoring:  The patient was maintained on a cardiac monitor.  I personally viewed and interpreted the cardiac monitored which showed an underlying rhythm of: afib   Medicines ordered and prescription drug management:   I have reviewed the patients home medicines and have made adjustments as needed  Consultations Obtained:  I requested consultation with the cardiologist,  and discussed lab and imaging findings as well as pertinent plan -consult pending   Problem List / ED Course:  afib   Reevaluation:  After the interventions noted above, I reevaluated the patient and found that they have :improved   Social Determinants of Health:  Lives at home   Dispostion:  Pending cards consult        Final Clinical Impression(s) / ED Diagnoses Final diagnoses:  Atrial fibrillation, unspecified type [facility])    Rx / DC Orders ED Discharge Orders     None         Jacalyn Lefevre, MD 03/25/23 1725

## 2023-03-25 NOTE — ED Triage Notes (Signed)
 Pt came in via POV d/t central Chest discomfort that began around 0730 this morning, does report Hx of an ablation 2 weeks ago. A/Ox4, does feel a little more "unstable" than dizzy. Denies any pain radiation.

## 2023-03-25 NOTE — Consult Note (Addendum)
 H&P   Patient ID: Bradley Rodriguez MRN: [id number]; DOB: 10-Jun-1960  Admit date: 03/25/2023 Date of Consult: 03/25/2023  PCP:  Richardean Chimera, MD   [facility] Providers Cardiologist:  Dina Rich, MD  Electrophysiologist:  Nobie Putnam, MD  {   Patient Profile:   Bradley Rodriguez is a 63 y.o. male with a hx of HTN, HLD, DM, colon Ca/GIB  who is being seen 03/25/2023 for the evaluation of CP, AFib post AFib ablation/watchman at the request of Dr. Particia Nearing.  AFib  Starts 1996 Failed multaq/amiodarone Failed OAC with GIBs  History of Present Illness:   [name] underwent AFib ablation with watchman implant 02/21/23 with dr. Jimmey Ralph, he did developed post procedure a small R sided groin hematoma monitored with vascular US and with stability in size, no aneurysm/pseudo  discharged 02/24/23 on Eliquis for 3 months, then transition to dual antiplatelet therapy with aspirin and Plavix   He saw B. Ollis, NP 03/04/23, feeling well, much better overall, R groin bruised but stable  Called the office 2/24, back in AFib, rate controlled  Comes to the ER today woth c/o AFib, dizzy spells, resting rates ok, elevated with exertion, denied CP He reports that he is well aware of his AFib feels it, feels generally fatigued, poorly. He came in today because this morning when he was getting up and around he felt off balance, (not lightheaded, not near syncopal), but so much so that he was uncomfortable driving his grandchild to school. He does have a chest symptom, not pain, but a vague awareness, sense of "something" Had the same symptom for a couple days post procedure, worried the watchman may have dislodged  He is feeling better currently, aware of his Afib, but no ongoing sense of dizziness  LABS K+ 4.9 BUN/Creat 21/1.22 HS Trop 12 >> 82 WBC 8.9 H/h 13/40 Plts 277  No meds given here in the ER He did take his morning medications this morning at home, he and his wife are certain,  no missed eliquis doses post procedure, taking it BOD  Home rate control is lopressor 100mg  BID   Past Medical History:  Diagnosis Date   A-fib [facility])    DCCV 2009, 2012, 2014, Nov 2016   Anxiety    Arthritis    Knees, wrist, ankles   Cancer (HCC) 07/2019   Kidney   Colon cancer (HCC)    Diabetes mellitus without complication (HCC)    Dysrhythmia 1996   A- Fib   GERD (gastroesophageal reflux disease)    History of kidney stones    HX: anticoagulation    very remotely and briefly on COumadin aound one of his CV, 2014 on Eliquis had hematuria with renal calculi and stopped   Hyperlipidemia    patient denies this   Hypertension    IBS (irritable bowel syndrome)    Normal cardiac stress test    Normal nuclear stress test , 2001   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 Jimmey Ralph.   S/P ablation of atrial fibrillation 02/21/2023   With Dr. Jimmey Ralph    Past Surgical History:  Procedure Laterality Date   ATRIAL FIBRILLATION ABLATION N/A 02/21/2023   Procedure: ATRIAL FIBRILLATION ABLATION;  Surgeon: Nobie Putnam, MD;  Location: [facility] INVASIVE CV LAB;  Service: Cardiovascular;  Laterality: N/A;   BIOPSY  08/19/2020   Procedure: BIOPSY;  Surgeon: Dolores Frame,  MD;  Location: [facility] ENDO SUITE;  Service: Gastroenterology;;   CARDIOVERSION N/A 10/15/2012   Procedure: CARDIOVERSION;  Surgeon: Laurey Morale, MD;  Location: [facility] ENDOSCOPY;  Service: Cardiovascular;  Laterality: N/A;   CARDIOVERSION N/A 11/26/2014   Procedure: CARDIOVERSION;  Surgeon: Antoine Poche, MD;  Location: [facility] ORS;  Service: Endoscopy;  Laterality: N/A;   CARDIOVERSION N/A 11/15/2015   Procedure: CARDIOVERSION;  Surgeon: Jake Bathe, MD;  Location: [facility] ENDOSCOPY;  Service: Cardiovascular;  Laterality: N/A;   CARDIOVERSION N/A 08/07/2016   Procedure: CARDIOVERSION;  Surgeon: Laqueta Linden, MD;   Location: [facility] ORS;  Service: Cardiovascular;  Laterality: N/A;   CARDIOVERSION N/A 09/11/2018   Procedure: CARDIOVERSION;  Surgeon: Antoine Poche, MD;  Location: [facility] ORS;  Service: Endoscopy;  Laterality: N/A;   CARDIOVERSION N/A 01/03/2021   Procedure: CARDIOVERSION;  Surgeon: Thomasene Ripple, DO;  Location: [facility] ENDOSCOPY;  Service: Cardiovascular;  Laterality: N/A;   CARDIOVERSION N/A 07/30/2022   Procedure: CARDIOVERSION;  Surgeon: Thurmon Fair, MD;  Location: [facility] INVASIVE CV LAB;  Service: Cardiovascular;  Laterality: N/A;   CARDIOVERSION N/A 11/20/2022   Procedure: CARDIOVERSION;  Surgeon: Thomasene Ripple, DO;  Location: [facility] INVASIVE CV LAB;  Service: Cardiovascular;  Laterality: N/A;   CARDIOVERSION N/A 01/04/2023   Procedure: CARDIOVERSION;  Surgeon: Wendall Stade, MD;  Location: [facility] INVASIVE CV LAB;  Service: Cardiovascular;  Laterality: N/A;   CATARACT EXTRACTION W/PHACO Right 01/18/2014   Procedure: CATARACT EXTRACTION PHACO AND INTRAOCULAR LENS PLACEMENT (IOC);  Surgeon: Gemma Payor, MD;  Location: [facility] ORS;  Service: Ophthalmology;  Laterality: Right;  CDE 9.95   COLONOSCOPY WITH PROPOFOL N/A 08/19/2020   Procedure: COLONOSCOPY WITH PROPOFOL;  Surgeon: Dolores Frame, MD;  [facility]: [facility];  Service: Gastroenterology;  Laterality: N/A;  8:20   COLONOSCOPY WITH PROPOFOL N/A 11/15/2021   Procedure: COLONOSCOPY WITH PROPOFOL;  Surgeon: Dolores Frame, MD;  [facility]: [facility];  Service: Gastroenterology;  Laterality: N/A;  130 ASA 2   CYSTOSCOPY WITH LITHOLAPAXY N/A 10/06/2019   Procedure: CYSTOSCOPY WITH LASER LITHOLAPAXY;  Surgeon: Rene Paci, MD;  Location: [facility] ORS;  Service: Urology;  Laterality: N/A;   ESOPHAGOGASTRODUODENOSCOPY (EGD) WITH PROPOFOL N/A 08/19/2020   Procedure: ESOPHAGOGASTRODUODENOSCOPY (EGD) WITH PROPOFOL;  Surgeon: Dolores Frame, MD;  [facility];  Service: Gastroenterology;  Laterality: N/A;   EYE  SURGERY Right    detached retina   FOOT SURGERY Left    KNEE ARTHROSCOPY Right    LAPAROSCOPIC RIGHT HEMI COLECTOMY N/A 09/30/2020   Procedure: LAPAROSCOPIC RIGHT HEMI COLECTOMY;  Surgeon: Andria Meuse, MD;  Location: [facility] ORS;  Service: General;  Laterality: N/A;   LEFT ATRIAL APPENDAGE OCCLUSION N/A 02/21/2023   Procedure: LEFT ATRIAL APPENDAGE OCCLUSION;  Surgeon: Nobie Putnam, MD;  Location: [facility] INVASIVE CV LAB;  Service: Cardiovascular;  Laterality: N/A;   LITHOTRIPSY     for kidney stones 2002   POLYPECTOMY  08/19/2020   Procedure: POLYPECTOMY INTESTINAL;  Surgeon: Dolores Frame, MD;  [facility];  Service: Gastroenterology;;   POLYPECTOMY  08/19/2020   Procedure: POLYPECTOMY;  Surgeon: Dolores Frame, MD;  [facility];  Service: Gastroenterology;;   POLYPECTOMY  11/15/2021   Procedure: POLYPECTOMY;  Surgeon: Dolores Frame, MD;  [facility];  Service: Gastroenterology;;   ROBOT ASSISTED LAPAROSCOPIC NEPHRECTOMY Left 10/06/2019   Procedure: XI ROBOTIC ASSISTED LAPAROSCOPIC NEPHRECTOMY WITH ADRENALECTOMY;  Surgeon: Rene Paci, MD;  Location: [facility] ORS;  Service: Urology;  Laterality: Left;   SUBMUCOSAL TATTOO INJECTION  08/19/2020   Procedure: SUBMUCOSAL TATTOO INJECTION;  Surgeon: Dolores Frame, MD;  [facility];  Service: Gastroenterology;;   TEE WITHOUT CARDIOVERSION N/A 11/26/2014   Procedure: TRANSESOPHAGEAL ECHOCARDIOGRAM (TEE) WITH PROPOFOL;  Surgeon: Antoine Poche, MD;  Location: [facility];  Service: Endoscopy;  Laterality: N/A;   TEE WITHOUT CARDIOVERSION N/A 11/15/2015   Procedure: TRANSESOPHAGEAL ECHOCARDIOGRAM (TEE);  Surgeon: Jake Bathe, MD;  Location: [facility] ENDOSCOPY;  Service: Cardiovascular;  Laterality: N/A;   TEE WITHOUT CARDIOVERSION N/A 09/11/2018   Procedure: TRANSESOPHAGEAL ECHOCARDIOGRAM (TEE) WITH PROPOFOL;  Surgeon: Antoine Poche, MD;  Location: [facility];   Service: Endoscopy;  Laterality: N/A;   TEE WITHOUT CARDIOVERSION N/A 01/03/2021   Procedure: TRANSESOPHAGEAL ECHOCARDIOGRAM (TEE);  Surgeon: Thomasene Ripple, DO;  Location: [facility] ENDOSCOPY;  Service: Cardiovascular;  Laterality: N/A;   TEE WITHOUT CARDIOVERSION N/A 07/30/2022   Procedure: TRANSESOPHAGEAL ECHOCARDIOGRAM;  Surgeon: Thurmon Fair, MD;  Location: [facility] INVASIVE CV LAB;  Service: Cardiovascular;  Laterality: N/A;   TRANSESOPHAGEAL ECHOCARDIOGRAM (CATH LAB) N/A 02/21/2023   Procedure: TRANSESOPHAGEAL ECHOCARDIOGRAM;  Surgeon: Nobie Putnam, MD;  Location: [facility] INVASIVE CV LAB;  Service: Cardiovascular;  Laterality: N/A;   VASECTOMY       Home Medications:  Prior to Admission medications   Medication Sig Start Date End Date Taking? Authorizing Provider  amLODipine (NORVASC) 5 MG tablet Take 1 tablet (5 mg total) by mouth daily. Patient taking differently: Take 5 mg by mouth every evening. 06/27/22   Sharlene Dory, NP  apixaban (ELIQUIS) 5 MG TABS tablet Take 1 tablet (5 mg total) by mouth 2 (two) times daily. 07/23/22   Eustace Pen, PA-C  atorvastatin (LIPITOR) 20 MG tablet Take 1 tablet (20 mg total) by mouth at bedtime. 02/22/23 08/21/23  Nobie Putnam, MD  DULoxetine (CYMBALTA) 60 MG capsule Take 60 mg by mouth in the morning. 09/06/21   [provider]  iron polysaccharides (NIFEREX) 150 MG capsule Take 150 mg by mouth 2 (two) times daily.    [provider]  MAGNESIUM GLYCINATE PO Take 420 mg by mouth at bedtime.    [provider]  metoprolol tartrate (LOPRESSOR) 100 MG tablet TAKE ONE TABLET BY MOUTH TWICE DAILY 12/19/22   Sharlene Dory, NP  OVER THE COUNTER MEDICATION Take 1 tablet by mouth 2 (two) times daily. Glucocil OTC supplement for blood sugar    [provider]  Vitamin D, Ergocalciferol, (DRISDOL) 1.25 MG (50000 UNIT) CAPS capsule Take 50,000 Units by mouth every Sunday.    [provider]  ZINC GLUCONATE PO Take 1 tablet by  mouth daily.    [provider]  prochlorperazine (COMPAZINE) 10 MG tablet Take 1 tablet (10 mg total) by mouth every 6 (six) hours as needed (Nausea or vomiting). 10/24/20 07/01/21  Doreatha Massed, MD    Inpatient Medications: Scheduled Meds:  Continuous Infusions:  PRN Meds:   Allergies:    Allergies  Allergen Reactions   Ace Inhibitors Cough   Lisinopril Cough   Xarelto [Rivaroxaban] Other (See Comments)    Heart out of rhythm    Quinine Derivatives Rash    Social History:   Social History   Socioeconomic History   Marital status: Married    Spouse name: Not on file   Number of children: 2   Years of education: Not on file   Highest education level: Not on file  Occupational History   Not on file  Tobacco Use  Smoking status: Never   Smokeless tobacco: Never  Vaping Use   Vaping status: Never Used  Substance and Sexual Activity   Alcohol use: No    Alcohol/week: 0.0 standard drinks of alcohol   Drug use: No   Sexual activity: Yes    Birth control/protection: Surgical  Other Topics Concern   Not on file  Social History Narrative   Not on file   Social Drivers of Health   Financial Resource Strain: Low Risk  (09/01/2020)   Overall Financial Resource Strain (CARDIA)    Difficulty of Paying Living Expenses: Not hard at all  Food Insecurity: No Food Insecurity (02/22/2023)   Hunger Vital Sign    Worried About Running Out of Food in the Last Year: Never true    Ran Out of Food in the Last Year: Never true  Transportation Needs: No Transportation Needs (02/22/2023)   PRAPARE - Administrator, Civil Service (Medical): No    Lack of Transportation (Non-Medical): No  Physical Activity: Inactive (09/01/2020)   Exercise Vital Sign    Days of Exercise per Week: 0 days    Minutes of Exercise per Session: 0 min  Stress: No Stress Concern Present (09/01/2020)   [employer] of Occupational Health - Occupational Stress Questionnaire     Feeling of Stress : Not at all  Social Connections: Moderately Integrated (09/01/2020)   Social Connection and Isolation Panel [NHANES]    Frequency of Communication with Friends and Family: More than three times a week    Frequency of Social Gatherings with Friends and Family: More than three times a week    Attends Religious Services: More than 4 times per year    Active Member of [employer] or Organizations: No    Attends Banker Meetings: Never    Marital Status: Married  Catering manager Violence: Not At Risk (02/22/2023)   Humiliation, Afraid, Rape, and Kick questionnaire    Fear of Current or Ex-Partner: No    Emotionally Abused: No    Physically Abused: No    Sexually Abused: No    Family History:   Family History  Problem Relation Age of Onset   Diabetes Mother    Hypertension Mother    Colon cancer Mother        dx. 60s   Diabetes Father    Colon cancer Father        dx. 60s   Lung cancer Maternal Grandmother        smoked   Bone cancer Maternal Grandfather        dx. 80s   Hypertension Other      ROS:  Please see the history of present illness.  All other ROS reviewed and negative.     Physical Exam/Data:   Vitals:   03/25/23 1236 03/25/23 1455 03/25/23 1539 03/25/23 1615  BP: 130/82  132/85 (!) 131/92  Pulse: 82  (!) 118 (!) 107  Resp: 20  17 16   Temp:  98.1 F (36.7 C)    TempSrc:      SpO2: 100%  99% 100%  Weight:      Height:       No intake or output data in the 24 hours ending 03/25/23 1629    03/25/2023    9:28 AM 03/04/2023    3:23 PM 02/21/2023    7:09 AM  Last 3 Weights  Weight (lbs) 305 lb 317 lb 3.2 oz 312 lb  Weight (kg) 138.347 kg 143.881 kg  141.522 kg     Body mass index is 33.51 kg/m.  General:  Well nourished, well developed, in no acute distress HEENT: normal Neck: no JVD Vascular: No carotid bruits; Distal pulses 2+ bilaterally Cardiac:  irreg-irreg; no murmurs, gallops or rubs Lungs:  CTA b/l, no wheezing, rhonchi or  rales  Abd: soft, nontender Ext: no edema Musculoskeletal:  No deformities Skin: warm and dry  Neuro:  no focal abnormalities noted Psych:  Normal affect   EKG:  The EKG was personally reviewed and demonstrates:    AFib 105bpm, no acute/ischemic looking changes   Telemetry:  Telemetry was personally reviewed and demonstrates:   AFib 100's mostly > 120's  Relevant CV Studies:   02/21/23: EPS/ablation/watchman CONCLUSIONS: 1. Successful PVI 2. Successful ablation/isolation of the posterior wall 3. Intracardiac echo reveals normal LV size and function, trivial pericardial effusion, 4 distinct pulmonary veins. 4. Successful implantation of a 31mm WATCHMAN FLX PRO left atrial appendage occlusive device    5. No early apparent complications.   Post Implant Anticoagulation Strategy: Continue Eliquis 5mg  twice daily for 3 months then transition to dual anti-platelet therapy.     02/21/23: TEE  1. TEE guided LAA closure. 31 mm Watchman FLX placed with 25%  compression. No device leak. Small iatrogenic ASD with L to R shunting  after the procedure. Trivial pericardial effusion before and after the  procedure. 3D echo confirmed device size and  correct placement. No immediate complications.   2. Left ventricular ejection fraction, by estimation, is 50 to 55%. The  left ventricle has low normal function.   3. Right ventricular systolic function is mildly reduced. The right  ventricular size is mildly enlarged.   4. Left atrial size was mildly dilated. No left atrial/left atrial  appendage thrombus was detected.   5. The mitral valve is grossly normal. Mild to moderate mitral valve  regurgitation. No evidence of mitral stenosis.   6. The aortic valve is tricuspid. Aortic valve regurgitation is not  visualized. No aortic stenosis is present.   7. There is mild (Grade II) layered plaque involving the descending  aorta.    02/01/23: Cardiac CT IMPRESSION: 1. The left atrial appendage  is a large chicken wing morphology without thrombus.   2. A 31 mm Watchman FLX device is recommended based on the above landing zone measurements.   3. A mid/mid IAS puncture site is recommended.   4. Optimal deployment angle: RAO 10 CRA 15   5. Small PFO.   6. CAC score of 253, which is 73rd percentile for age-, sex-, and race-matched controls.   7. Mildly dilated aortic root up to 43 mm.  Laboratory Data:  High Sensitivity Troponin:   Recent Labs  Lab 03/25/23 0941 03/25/23 1129  TROPONINIHS 12 82*     Chemistry Recent Labs  Lab 03/25/23 0941  NA 139  K 4.9  CL 102  CO2 23  GLUCOSE 185*  BUN 21  CREATININE 1.22  CALCIUM 9.5  GFRNONAA >60  ANIONGAP 14    No results for input(s): "PROT", "ALBUMIN", "AST", "ALT", "ALKPHOS", "BILITOT" in the last 168 hours. Lipids No results for input(s): "CHOL", "TRIG", "HDL", "LABVLDL", "LDLCALC", "CHOLHDL" in the last 168 hours.  Hematology Recent Labs  Lab 03/25/23 0941  WBC 8.9  RBC 4.52  HGB 13.6  HCT 40.7  MCV 90.0  MCH 30.1  MCHC 33.4  RDW 13.5  PLT 277   Thyroid No results for input(s): "TSH", "FREET4" in the last 168  hours.  BNPNo results for input(s): "BNP", "PROBNP" in the last 168 hours.  DDimer No results for input(s): "DDIMER" in the last 168 hours.   Radiology/Studies:  DG Chest 2 View Result Date: 03/25/2023 CLINICAL DATA:  Chest pain EXAM: CHEST - 2 VIEW COMPARISON:  X-ray 02/21/2023 FINDINGS: The right inferior costophrenic angle is clipped off the edge of the film. No consolidation, pneumothorax or effusion. No edema. Normal cardiopericardial silhouette. Calcified aorta. Dense tiny right apical lung nodule, based on appearance likely calcified and related to old granulomatous disease or other process. IMPRESSION: No acute cardiopulmonary disease. The right inferior costophrenic angle is clipped off the edge of the film. Electronically Signed   By: Karen Kays M.D.   On: 03/25/2023 11:16      Assessment and Plan:   Persistent Afib CHA2DS2Vasc is 2, on eliquis, appropriately dosed Not entirely rate controlled Not unusual post ablation  Dizzy More so off balance when up and around this morning Doesn't drink much water/or anything really Labs look OK  Chest symptom Denies this as a chest pain Similar symptom for 2-3 days post procedure HS Trop behavior, unusual at  12 >> 82  Will review with Dr. Jimmey Ralph Do we need to follow Trops?  He has some coronary ca on his CT, though quite atypical sounding TEE/DCCV? DCCV in the ER?  D/w Dr. Jimmey Ralph ADDEND: Will bring him in for observation Likely plan cardiac CT for coronaries and check watchman  #3 trop is drawn/running ASA ordered for now      Risk Assessment/Risk Scores:    For questions or updates, please contact [facility] Please consult [email or web address] for contact info under    Signed, Sheilah Pigeon, PA-C  03/25/2023 4:29 PM   I have seen, examined the patient, and reviewed the above assessment and plan.    HPI: Bradley Rodriguez is a 63 y.o. male with a hx of HTN, HLD, DM, colon Ca/GIB, and persistent atrial fibrillation status post concomitant catheter ablation and watchman implant on 02/21/2023.  Patient states that he did very well for the first 24 days after his catheter ablation.  He states that he had more energy during this time and less shortness of breath.  On 03/18/2023 unfortunately he went back into atrial fibrillation; this was confirmed by his Kardia device.  Since then he has had more fatigue and shortness of breath but was not significantly limiting.  Today, however, while driving his grandchild to school he developed what he calls unsteadiness, associated with some dizziness and feeling off balance.  He has also had some mild chest discomfort.  He presented to the ED for evaluation and electrophysiology was consulted.  GEN: No acute distress.   Cardiac: Tachycardic, regular. Resp:  Normal work of breathing.  R groin: Soft, without hematoma, bleeding or ecchymosis. Ext: No edema.  Neuro: No gross focal deficits. Psych: Normal affect   Troponin: 12 -> 82 -> 430  EKG: AF at 105bpm  Assessment and plan:  #. Persistent atrial fibrillation: Has history of longstanding persistent atrial fibrillation and has failed antiarrhythmic drug therapy previously.  Maintained sinus rhythm for approximately 24 days after catheter ablation.  Unfortunately now with early recurrence. -We likely will pursue TEE and cardioversion after left heart catheterization.  TEE due to presence of Watchman device for only 1 month.  Need to rule out device related thrombus prior to cardioversion. -We will start antiarrhythmic drug therapy after cardioversion.  Regimen will be based on results of  left heart catheterization (present/extent of coronary disease). -No missed doses of Eliquis.  Start IV heparin.  #. NSTEMI: On initial presentation his troponin was normal then up trended significantly. Patient has been in atrial fibrillation for greater than 1 week.  Degree of troponin elevation seems out of proportion to his atrial fibrillation.  He has coronary calcification on CT. Troponin elevation is also in the setting of normal renal function. -Patient was given aspirin 325 mg x 1.  Will start aspirin 81 mg once daily. -We will hold additional doses of Eliquis and start IV heparin. -Plan for left heart catheterization tomorrow.  Nobie Putnam, MD 03/25/2023 11:19 PM

## 2023-03-26 ENCOUNTER — Encounter (HOSPITAL_COMMUNITY)
Admission: EM | Disposition: A | Discharge status: Home / Self Care | Payer: Self-pay | Source: Home / Self Care | Attending: Cardiology

## 2023-03-26 ENCOUNTER — Encounter (HOSPITAL_COMMUNITY): Payer: Self-pay | Admitting: Cardiovascular Disease

## 2023-03-26 DIAGNOSIS — I2510 Atherosclerotic heart disease of native coronary artery without angina pectoris: Secondary | ICD-10-CM | POA: Diagnosis present

## 2023-03-26 DIAGNOSIS — Z85528 Personal history of other malignant neoplasm of kidney: Secondary | ICD-10-CM | POA: Diagnosis not present

## 2023-03-26 DIAGNOSIS — T82867A Thrombosis of cardiac prosthetic devices, implants and grafts, initial encounter: Secondary | ICD-10-CM | POA: Diagnosis present

## 2023-03-26 DIAGNOSIS — E66811 Obesity, class 1: Secondary | ICD-10-CM | POA: Diagnosis present

## 2023-03-26 DIAGNOSIS — E1122 Type 2 diabetes mellitus with diabetic chronic kidney disease: Secondary | ICD-10-CM | POA: Diagnosis present

## 2023-03-26 DIAGNOSIS — Y712 Prosthetic and other implants, materials and accessory cardiovascular devices associated with adverse incidents: Secondary | ICD-10-CM | POA: Diagnosis present

## 2023-03-26 DIAGNOSIS — Z801 Family history of malignant neoplasm of trachea, bronchus and lung: Secondary | ICD-10-CM | POA: Diagnosis not present

## 2023-03-26 DIAGNOSIS — R160 Hepatomegaly, not elsewhere classified: Secondary | ICD-10-CM | POA: Diagnosis not present

## 2023-03-26 DIAGNOSIS — Z833 Family history of diabetes mellitus: Secondary | ICD-10-CM | POA: Diagnosis not present

## 2023-03-26 DIAGNOSIS — Z6834 Body mass index (BMI) 34.0-34.9, adult: Secondary | ICD-10-CM | POA: Diagnosis not present

## 2023-03-26 DIAGNOSIS — Z7982 Long term (current) use of aspirin: Secondary | ICD-10-CM | POA: Diagnosis not present

## 2023-03-26 DIAGNOSIS — N182 Chronic kidney disease, stage 2 (mild): Secondary | ICD-10-CM | POA: Diagnosis present

## 2023-03-26 DIAGNOSIS — E119 Type 2 diabetes mellitus without complications: Secondary | ICD-10-CM | POA: Diagnosis not present

## 2023-03-26 DIAGNOSIS — K760 Fatty (change of) liver, not elsewhere classified: Secondary | ICD-10-CM | POA: Diagnosis not present

## 2023-03-26 DIAGNOSIS — I4811 Longstanding persistent atrial fibrillation: Secondary | ICD-10-CM | POA: Diagnosis present

## 2023-03-26 DIAGNOSIS — Z8249 Family history of ischemic heart disease and other diseases of the circulatory system: Secondary | ICD-10-CM | POA: Diagnosis not present

## 2023-03-26 DIAGNOSIS — D6869 Other thrombophilia: Secondary | ICD-10-CM | POA: Diagnosis present

## 2023-03-26 DIAGNOSIS — I4819 Other persistent atrial fibrillation: Secondary | ICD-10-CM | POA: Diagnosis not present

## 2023-03-26 DIAGNOSIS — Z7901 Long term (current) use of anticoagulants: Secondary | ICD-10-CM | POA: Diagnosis not present

## 2023-03-26 DIAGNOSIS — K219 Gastro-esophageal reflux disease without esophagitis: Secondary | ICD-10-CM | POA: Diagnosis present

## 2023-03-26 DIAGNOSIS — R7989 Other specified abnormal findings of blood chemistry: Secondary | ICD-10-CM

## 2023-03-26 DIAGNOSIS — C189 Malignant neoplasm of colon, unspecified: Secondary | ICD-10-CM | POA: Diagnosis not present

## 2023-03-26 DIAGNOSIS — Z800 Family history of malignant neoplasm of digestive organs: Secondary | ICD-10-CM | POA: Diagnosis not present

## 2023-03-26 DIAGNOSIS — F419 Anxiety disorder, unspecified: Secondary | ICD-10-CM | POA: Diagnosis present

## 2023-03-26 DIAGNOSIS — I340 Nonrheumatic mitral (valve) insufficiency: Secondary | ICD-10-CM | POA: Diagnosis present

## 2023-03-26 DIAGNOSIS — I361 Nonrheumatic tricuspid (valve) insufficiency: Secondary | ICD-10-CM | POA: Diagnosis not present

## 2023-03-26 DIAGNOSIS — Z905 Acquired absence of kidney: Secondary | ICD-10-CM | POA: Diagnosis not present

## 2023-03-26 DIAGNOSIS — I4891 Unspecified atrial fibrillation: Secondary | ICD-10-CM | POA: Diagnosis present

## 2023-03-26 DIAGNOSIS — I129 Hypertensive chronic kidney disease with stage 1 through stage 4 chronic kidney disease, or unspecified chronic kidney disease: Secondary | ICD-10-CM | POA: Diagnosis present

## 2023-03-26 DIAGNOSIS — I10 Essential (primary) hypertension: Secondary | ICD-10-CM | POA: Diagnosis not present

## 2023-03-26 DIAGNOSIS — I21A1 Myocardial infarction type 2: Secondary | ICD-10-CM | POA: Diagnosis present

## 2023-03-26 DIAGNOSIS — I214 Non-ST elevation (NSTEMI) myocardial infarction: Secondary | ICD-10-CM | POA: Diagnosis not present

## 2023-03-26 DIAGNOSIS — E785 Hyperlipidemia, unspecified: Secondary | ICD-10-CM | POA: Diagnosis present

## 2023-03-26 DIAGNOSIS — Z9049 Acquired absence of other specified parts of digestive tract: Secondary | ICD-10-CM | POA: Diagnosis not present

## 2023-03-26 DIAGNOSIS — Z85038 Personal history of other malignant neoplasm of large intestine: Secondary | ICD-10-CM | POA: Diagnosis not present

## 2023-03-26 HISTORY — PX: LEFT HEART CATH AND CORONARY ANGIOGRAPHY: CATH118249

## 2023-03-26 LAB — CBC
HCT: 37.5 % — ABNORMAL LOW (ref 39.0–52.0)
Hemoglobin: 12.6 g/dL — ABNORMAL LOW (ref 13.0–17.0)
MCH: 29.9 pg (ref 26.0–34.0)
MCHC: 33.6 g/dL (ref 30.0–36.0)
MCV: 88.9 fL (ref 80.0–100.0)
Platelets: 278 10*3/uL (ref 150–400)
RBC: 4.22 MIL/uL (ref 4.22–5.81)
RDW: 13.4 % (ref 11.5–15.5)
WBC: 6.3 10*3/uL (ref 4.0–10.5)
nRBC: 0 % (ref 0.0–0.2)

## 2023-03-26 LAB — APTT
aPTT: 44 s — ABNORMAL HIGH (ref 24–36)
aPTT: 52 s — ABNORMAL HIGH (ref 24–36)

## 2023-03-26 LAB — MRSA NEXT GEN BY PCR, NASAL: MRSA by PCR Next Gen: NOT DETECTED

## 2023-03-26 SURGERY — LEFT HEART CATH AND CORONARY ANGIOGRAPHY
Anesthesia: LOCAL

## 2023-03-26 MED ORDER — SODIUM CHLORIDE 0.9 % WEIGHT BASED INFUSION: 1.0000 mL/kg/h | INTRAVENOUS | Status: DC

## 2023-03-26 MED ORDER — LIDOCAINE HCL (PF) 1 % IJ SOLN
INTRAMUSCULAR | Status: AC
  Filled 2023-03-26: qty 30

## 2023-03-26 MED ORDER — SODIUM CHLORIDE 0.9% FLUSH
3.0000 mL | Freq: Two times a day (BID) | INTRAVENOUS | Status: DC
  Administered 2023-03-26 – 2023-03-31 (×9): 3 mL via INTRAVENOUS

## 2023-03-26 MED ORDER — SODIUM CHLORIDE 0.9 % WEIGHT BASED INFUSION
3.0000 mL/kg/h | INTRAVENOUS | Status: DC
  Administered 2023-03-26: 3 mL/kg/h via INTRAVENOUS

## 2023-03-26 MED ORDER — LIDOCAINE HCL (PF) 1 % IJ SOLN
INTRAMUSCULAR | Status: DC | PRN
  Administered 2023-03-26: 2 mL via INTRADERMAL

## 2023-03-26 MED ORDER — HEPARIN SODIUM (PORCINE) 1000 UNIT/ML IJ SOLN
INTRAMUSCULAR | Status: DC | PRN
Start: 2023-03-26 — End: 2023-03-26
  Administered 2023-03-26: 7000 [IU] via INTRAVENOUS

## 2023-03-26 MED ORDER — HEPARIN (PORCINE) 25000 UT/250ML-% IV SOLN
2300.0000 [IU]/h | INTRAVENOUS | Status: DC
  Administered 2023-03-26 – 2023-03-27 (×2): 2300 [IU]/h via INTRAVENOUS
  Filled 2023-03-26: qty 250

## 2023-03-26 MED ORDER — ASPIRIN 81 MG PO CHEW: 81.0000 mg | CHEWABLE_TABLET | ORAL | Status: DC

## 2023-03-26 MED ORDER — SODIUM CHLORIDE 0.9% FLUSH
3.0000 mL | Freq: Two times a day (BID) | INTRAVENOUS | Status: DC
  Administered 2023-03-27: 10 mL via INTRAVENOUS

## 2023-03-26 MED ORDER — ASPIRIN 81 MG PO CHEW: 81.0000 mg | CHEWABLE_TABLET | ORAL | Status: AC

## 2023-03-26 MED ORDER — VERAPAMIL HCL 2.5 MG/ML IV SOLN
INTRAVENOUS | Status: AC
  Filled 2023-03-26: qty 2

## 2023-03-26 MED ORDER — SODIUM CHLORIDE 0.9 % IV SOLN: 250.0000 mL | INTRAVENOUS | Status: AC | PRN

## 2023-03-26 MED ORDER — IOHEXOL 350 MG/ML SOLN
INTRAVENOUS | Status: DC | PRN
Start: 2023-03-26 — End: 2023-03-26
  Administered 2023-03-26: 55 mL

## 2023-03-26 MED ORDER — VERAPAMIL HCL 2.5 MG/ML IV SOLN
INTRAVENOUS | Status: DC | PRN
  Administered 2023-03-26: 10 mL via INTRA_ARTERIAL

## 2023-03-26 MED ORDER — SODIUM CHLORIDE 0.9 % WEIGHT BASED INFUSION: 3.0000 mL/kg/h | INTRAVENOUS | Status: DC

## 2023-03-26 MED ORDER — SODIUM CHLORIDE 0.9 % IV SOLN: INTRAVENOUS | Status: AC

## 2023-03-26 MED ORDER — HEPARIN SODIUM (PORCINE) 1000 UNIT/ML IJ SOLN
INTRAMUSCULAR | Status: AC
  Filled 2023-03-26: qty 10

## 2023-03-26 MED ORDER — HEPARIN (PORCINE) IN NACL 1000-0.9 UT/500ML-% IV SOLN
INTRAVENOUS | Status: DC | PRN
Start: 2023-03-26 — End: 2023-03-26
  Administered 2023-03-26 (×2): 500 mL

## 2023-03-26 MED ORDER — SODIUM CHLORIDE 0.9 % WEIGHT BASED INFUSION
1.0000 mL/kg/h | INTRAVENOUS | Status: DC
  Administered 2023-03-26: 1 mL/kg/h via INTRAVENOUS

## 2023-03-26 MED ORDER — LABETALOL HCL 5 MG/ML IV SOLN: 10.0000 mg | INTRAVENOUS | Status: AC | PRN

## 2023-03-26 MED ORDER — SODIUM CHLORIDE 0.9% FLUSH: 3.0000 mL | INTRAVENOUS | Status: DC | PRN

## 2023-03-26 MED ORDER — MIDAZOLAM HCL 2 MG/2ML IJ SOLN
INTRAMUSCULAR | Status: AC
  Filled 2023-03-26: qty 2

## 2023-03-26 MED ORDER — APIXABAN 5 MG PO TABS
5.0000 mg | ORAL_TABLET | Freq: Two times a day (BID) | ORAL | Status: DC
Start: 2023-03-27 — End: 2023-03-27
  Administered 2023-03-27: 5 mg via ORAL
  Filled 2023-03-26: qty 1

## 2023-03-26 MED ORDER — HYDRALAZINE HCL 20 MG/ML IJ SOLN: 10.0000 mg | INTRAMUSCULAR | Status: AC | PRN

## 2023-03-26 MED ORDER — FENTANYL CITRATE (PF) 100 MCG/2ML IJ SOLN
INTRAMUSCULAR | Status: AC
  Filled 2023-03-26: qty 2

## 2023-03-26 MED ORDER — FENTANYL CITRATE (PF) 100 MCG/2ML IJ SOLN
INTRAMUSCULAR | Status: DC | PRN
  Administered 2023-03-26 (×2): 25 ug via INTRAVENOUS

## 2023-03-26 MED ORDER — MIDAZOLAM HCL 2 MG/2ML IJ SOLN
INTRAMUSCULAR | Status: DC | PRN
  Administered 2023-03-26 (×2): 1 mg via INTRAVENOUS

## 2023-03-26 MED ORDER — SODIUM CHLORIDE 0.9% FLUSH
3.0000 mL | INTRAVENOUS | Status: DC | PRN
Start: 2023-03-26 — End: 2023-03-27

## 2023-03-26 SURGICAL SUPPLY — 10 items
CATH INFINITI 5F JL4 125CM (CATHETERS) IMPLANT
CATH INFINITI 5FR JR4 125CM (CATHETERS) IMPLANT
DEVICE RAD COMP TR BAND LRG (VASCULAR PRODUCTS) IMPLANT
GLIDESHEATH SLEND SS 6F .021 (SHEATH) IMPLANT
GUIDEWIRE INQWIRE 1.5J.035X260 (WIRE) IMPLANT
INQWIRE 1.5J .035X260CM (WIRE) ×1 IMPLANT
PACK CARDIAC CATHETERIZATION (CUSTOM PROCEDURE TRAY) ×2 IMPLANT
PROTECTION STATION PRESSURIZED (MISCELLANEOUS) ×1 IMPLANT
SET ATX-X65L (MISCELLANEOUS) IMPLANT
STATION PROTECTION PRESSURIZED (MISCELLANEOUS) IMPLANT

## 2023-03-26 NOTE — Plan of Care (Signed)
  Problem: Education: Goal: Knowledge of General Education information will improve Description: Including pain rating scale, medication(s)/side effects and non-pharmacologic comfort measures Outcome: Progressing   Problem: Clinical Measurements: Goal: Ability to maintain clinical measurements within normal limits will improve Outcome: Progressing Goal: Will remain free from infection Outcome: Progressing   Problem: Coping: Goal: Level of anxiety will decrease Outcome: Progressing   Problem: Pain Managment: Goal: General experience of comfort will improve and/or be controlled Outcome: Progressing

## 2023-03-26 NOTE — Progress Notes (Signed)
 PHARMACY - ANTICOAGULATION CONSULT NOTE  Pharmacy Consult for UFH IV Infusion Indication: atrial fibrillation  Allergies  Allergen Reactions   Ace Inhibitors Cough   Cozaar [Losartan Potassium] Other (See Comments)    Hyperkalemia    Xarelto [Rivaroxaban] Other (See Comments)    Heart out of rhythm    Zestril [Lisinopril] Cough   Quinine Derivatives Rash    Patient Measurements: Height: 6\' 7"  (200.7 cm) Weight: (!) 140.3 kg (309 lb 4.9 oz) IBW/kg (Calculated) : 93.7 Heparin Dosing Weight: 125.5 kg  Vital Signs: Temp: 98.1 F (36.7 C) (03/04 1702) Temp Source: Oral (03/04 1702) BP: 143/90 (03/04 1702) Pulse Rate: 84 (03/04 1630)  Labs: Recent Labs    03/25/23 0941 03/25/23 1129 03/25/23 1748 03/25/23 2112 03/26/23 0429 03/26/23 1051  HGB 13.6  --   --   --   --  12.6*  HCT 40.7  --   --   --   --  37.5*  PLT 277  --   --   --   --  278  APTT  --   --   --   --  44* 52*  CREATININE 1.22  --   --   --   --   --   TROPONINIHS 12 82* 430* 749*  --   --     Estimated Creatinine Clearance: 97.2 mL/min (by C-G formula based on SCr of 1.22 mg/dL).  Assessment: 62 YOM visit ER with c/o Afib, dizziness, and DOE. Denies CP. Patient on Eliquis PTA for AF. CBC is stable. No signs of bleeding per RN.  Heparin to resume 2 hr post-TR band removal tonight (@ 21:00). Eliquis ordered to start tomorrow at 10:00. Heparin stop time placed.   Goal of Therapy:  Heparin level 0.3-0.7 units/ml aPTT 66-102 seconds Monitor platelets by anticoagulation protocol: Yes   Plan:  Resume heparin infusion at 2300 units/hr No heparin levels ordered as heparin is set to stop in the AM  Continue to monitor H&H and platelets  Cedric Fishman, PharmD, BCPS, BCCCP Clinical Pharmacist

## 2023-03-26 NOTE — Progress Notes (Signed)
 PHARMACY - ANTICOAGULATION CONSULT NOTE  Pharmacy Consult for UFH IV Infusion Indication: atrial fibrillation  Allergies  Allergen Reactions   Ace Inhibitors Cough   Cozaar [Losartan Potassium] Other (See Comments)    Hyperkalemia    Xarelto [Rivaroxaban] Other (See Comments)    Heart out of rhythm    Zestril [Lisinopril] Cough   Quinine Derivatives Rash    Patient Measurements: Height: 6\' 7"  (200.7 cm) Weight: (!) 140.3 kg (309 lb 4.9 oz) IBW/kg (Calculated) : 93.7 Heparin Dosing Weight: 125.5 kg  Vital Signs: Temp: 97.7 F (36.5 C) (03/04 1012) Temp Source: Oral (03/04 1012) BP: 140/86 (03/04 1012) Pulse Rate: 108 (03/04 0945)  Labs: Recent Labs    03/25/23 0941 03/25/23 1129 03/25/23 1748 03/25/23 2112 03/26/23 0429 03/26/23 1051  HGB 13.6  --   --   --   --  12.6*  HCT 40.7  --   --   --   --  37.5*  PLT 277  --   --   --   --  278  APTT  --   --   --   --  44* 52*  CREATININE 1.22  --   --   --   --   --   TROPONINIHS 12 82* 430* 749*  --   --     Estimated Creatinine Clearance: 97.2 mL/min (by C-G formula based on SCr of 1.22 mg/dL).  Assessment: 62 YOM visit ER with c/o Afib, dizziness, and DOE. Denies CP. Patient on Eliquis PTA for AF. CBC is stable. No signs of bleeding per RN.  AM aPTT 52 seconds on 2100 units/hr (subtherapeutic). Appears it was only 5 hours between the dose change and the level being drawn. Per RN, no issues with heparin running continuously or signs/symptoms of bleeding.  Goal of Therapy:  Heparin level 0.3-0.7 units/ml aPTT 66-102 seconds Monitor platelets by anticoagulation protocol: Yes   Plan:  Increase heparin infusion to 2300 units/hr Check aPTT level in 6 - 8 hours and daily while on heparin Continue to monitor H&H and platelets  Jeanella Cara, PharmD, [facility] Clinical Pharmacist Please see AMION for all Pharmacists' Contact Phone Numbers 03/26/2023, 12:25 PM

## 2023-03-26 NOTE — Progress Notes (Cosign Needed)
 Rounding Note    Patient Name: Bradley Rodriguez Date of Encounter: 03/26/2023  [facility] Cardiologist: Dina Rich, MD   Subjective   No ongoing complaints  Inpatient Medications    Scheduled Meds:  aspirin  81 mg Oral Daily   atorvastatin  20 mg Oral QHS   DULoxetine  60 mg Oral q AM   metoprolol tartrate  100 mg Oral BID   Continuous Infusions:  heparin 2,100 Units/hr (03/26/23 0505)   PRN Meds: acetaminophen, nitroGLYCERIN   Vital Signs    Vitals:   03/26/23 0600 03/26/23 0615 03/26/23 0630 03/26/23 0642  BP: (!) 113/57 (!) 109/59 112/63   Pulse: (!) 45 61 (!) 30   Resp: 15 18 15    Temp:    98.3 F (36.8 C)  TempSrc:    Oral  SpO2: 97% 98% 97%   Weight:      Height:       No intake or output data in the 24 hours ending 03/26/23 0751    03/25/2023    9:28 AM 03/04/2023    3:23 PM 02/21/2023    7:09 AM  Last 3 Weights  Weight (lbs) 305 lb 317 lb 3.2 oz 312 lb  Weight (kg) 138.347 kg 143.881 kg 141.522 kg      Telemetry    AFib 100's-110's mostly - Personally Reviewed  ECG    No new EKGs - Personally Reviewed  Physical Exam    Seen/examined by Dr. Jimmey Ralph GEN: No acute distress.   Neck: No JVD Cardiac: irreg-irreg, no murmurs, rubs, or gallops.  Respiratory: CTA b/l. GI: Soft, nontender, non-distended  MS: No edema; No deformity. Neuro:  Nonfocal  Psych: Normal affect   Labs    High Sensitivity Troponin:   Recent Labs  Lab 03/25/23 0941 03/25/23 1129 03/25/23 1748 03/25/23 2112  TROPONINIHS 12 82* 430* 749*     Chemistry Recent Labs  Lab 03/25/23 0941  NA 139  K 4.9  CL 102  CO2 23  GLUCOSE 185*  BUN 21  CREATININE 1.22  CALCIUM 9.5  GFRNONAA >60  ANIONGAP 14    Lipids No results for input(s): "CHOL", "TRIG", "HDL", "LABVLDL", "LDLCALC", "CHOLHDL" in the last 168 hours.  Hematology Recent Labs  Lab 03/25/23 0941  WBC 8.9  RBC 4.52  HGB 13.6  HCT 40.7  MCV 90.0  MCH 30.1  MCHC 33.4  RDW 13.5  PLT  277   Thyroid No results for input(s): "TSH", "FREET4" in the last 168 hours.  BNPNo results for input(s): "BNP", "PROBNP" in the last 168 hours.  DDimer No results for input(s): "DDIMER" in the last 168 hours.   Radiology    DG Chest 2 View Result Date: 03/25/2023 CLINICAL DATA:  Chest pain EXAM: CHEST - 2 VIEW COMPARISON:  X-ray 02/21/2023 FINDINGS: The right inferior costophrenic angle is clipped off the edge of the film. No consolidation, pneumothorax or effusion. No edema. Normal cardiopericardial silhouette. Calcified aorta. Dense tiny right apical lung nodule, based on appearance likely calcified and related to old granulomatous disease or other process. IMPRESSION: No acute cardiopulmonary disease. The right inferior costophrenic angle is clipped off the edge of the film. Electronically Signed   By: Karen Kays M.D.   On: 03/25/2023 11:16    Cardiac Studies   02/21/23: EPS/ablation/watchman CONCLUSIONS: 1. Successful PVI 2. Successful ablation/isolation of the posterior wall 3. Intracardiac echo reveals normal LV size and function, trivial pericardial effusion, 4 distinct pulmonary veins. 4. Successful implantation  of a 31mm WATCHMAN FLX PRO left atrial appendage occlusive device    5. No early apparent complications.   Post Implant Anticoagulation Strategy: Continue Eliquis 5mg  twice daily for 3 months then transition to dual anti-platelet therapy.      02/21/23: TEE  1. TEE guided LAA closure. 31 mm Watchman FLX placed with 25%  compression. No device leak. Small iatrogenic ASD with L to R shunting  after the procedure. Trivial pericardial effusion before and after the  procedure. 3D echo confirmed device size and  correct placement. No immediate complications.   2. Left ventricular ejection fraction, by estimation, is 50 to 55%. The  left ventricle has low normal function.   3. Right ventricular systolic function is mildly reduced. The right  ventricular size is mildly  enlarged.   4. Left atrial size was mildly dilated. No left atrial/left atrial  appendage thrombus was detected.   5. The mitral valve is grossly normal. Mild to moderate mitral valve  regurgitation. No evidence of mitral stenosis.   6. The aortic valve is tricuspid. Aortic valve regurgitation is not  visualized. No aortic stenosis is present.   7. There is mild (Grade II) layered plaque involving the descending  aorta.      02/01/23: Cardiac CT IMPRESSION: 1. The left atrial appendage is a large chicken wing morphology without thrombus.   2. A 31 mm Watchman FLX device is recommended based on the above landing zone measurements.   3. A mid/mid IAS puncture site is recommended.   4. Optimal deployment angle: RAO 10 CRA 15   5. Small PFO.   6. CAC score of 253, which is 73rd percentile for age-, sex-, and race-matched controls.   7. Mildly dilated aortic root up to 43 mm.  Patient Profile     63 y.o. male HTN, HLD, DM, colon Ca/GIB, AFib admitted with symptomatic Afib, CP  Recently: AFib ablation with watchman implant 02/21/23   Assessment & Plan    Persistent Afib CHA2DS2Vasc is 2, on eliquis, appropriately dosed Eliquis held > heparin Last dose yesterday morning at home Fair rate control 100's > continue his lopressor  Plan possible TEE/DCCV pending cath outcome/med needs (DAPT?)  Perhaps tomorrow   Dizzy More so off balance when up and around this morning Doesn't drink much water/or anything really Labs look OK Not an ongoing complaint    NSTEMI Denies this as a chest pain Similar symptom for 2-3 days post procedure HS Trop behavior, unusual at  12 >> 82 > 430 > 749 Vague nondescript complaint denies ongoing here HS Trops c/w NTEMI Planned for LHC today    For questions or updates, please contact [facility] Please consult [email or web address] for contact info under        Signed, Sheilah Pigeon, PA-C  03/26/2023, 7:51 AM    I have seen,  examined the patient, and reviewed the above assessment and plan.    Interval: Remains in AF. No chest pain. Troponin uptrending.   GEN: No acute distress.   Cardiac: Normal rate, irregular rhythm Psych: Normal affect   Assessment and plan:  #. Persistent atrial fibrillation: Has history of longstanding persistent atrial fibrillation and has failed antiarrhythmic drug therapy previously.  Maintained sinus rhythm for approximately 24 days after catheter ablation.  Unfortunately now with early recurrence. -We likely will pursue TEE and cardioversion after left heart catheterization.  TEE due to presence of Watchman device for only 1 month.  Need to rule out device  related thrombus prior to cardioversion. -We will start antiarrhythmic drug therapy after cardioversion.  Regimen will be based on results of left heart catheterization (present/extent of coronary disease. -No missed doses of Eliquis.  Start IV heparin.   #. NSTEMI: On initial presentation his troponin was normal then up trended significantly. Patient has been in atrial fibrillation for greater than 1 week.  Degree of troponin elevation seems out of proportion to his atrial fibrillation.  He has coronary calcification on CT. Troponin elevation is also in the setting of normal renal function. -Patient was given aspirin 325 mg x 1.  Continue aspirin 81 mg once daily for now. -Continue IV heparin. -Left heart catheterization today.  Nobie Putnam, MD

## 2023-03-26 NOTE — Interval H&P Note (Signed)
 History and Physical Interval Note:  03/26/2023 3:21 PM  Bradley Rodriguez  has presented today for surgery, with the diagnosis of chest pain.  The various methods of treatment have been discussed with the patient and family. After consideration of risks, benefits and other options for treatment, the patient has consented to  Procedure(s): LEFT HEART CATH AND CORONARY ANGIOGRAPHY (N/A) as a surgical intervention.  The patient's history has been reviewed, patient examined, no change in status, stable for surgery.  I have reviewed the patient's chart and labs.  Questions were answered to the patient's satisfaction.    Cath Lab Visit (complete for each Cath Lab visit)  Clinical Evaluation Leading to the Procedure:   ACS: Yes.    Non-ACS:    Anginal Classification: CCS III  Anti-ischemic medical therapy: Maximal Therapy (2 or more classes of medications)  Non-Invasive Test Results: No non-invasive testing performed  Prior CABG: No previous CABG        Verne Carrow

## 2023-03-26 NOTE — H&P (Signed)
 Late entry: See consult note dated 03/24/33 to serve as H&P  Francis Dowse, PA-C

## 2023-03-26 NOTE — Progress Notes (Signed)
 PHARMACY - ANTICOAGULATION CONSULT NOTE  Pharmacy Consult for UFH IV Infusion Indication: atrial fibrillation  Allergies  Allergen Reactions   Ace Inhibitors Cough   Cozaar [Losartan Potassium] Other (See Comments)    Hyperkalemia    Xarelto [Rivaroxaban] Other (See Comments)    Heart out of rhythm    Zestril [Lisinopril] Cough   Quinine Derivatives Rash    Patient Measurements: Height: 6\' 8"  (203.2 cm) Weight: (!) 138.3 kg (305 lb) IBW/kg (Calculated) : 96 Heparin Dosing Weight: 138.3 kg  Vital Signs: Temp: 98.5 F (36.9 C) (03/04 0024) Temp Source: Oral (03/04 0024) BP: 114/56 (03/04 0445) Pulse Rate: 49 (03/04 0445)  Labs: Recent Labs    03/25/23 0941 03/25/23 1129 03/25/23 1748 03/25/23 2112 03/26/23 0429  HGB 13.6  --   --   --   --   HCT 40.7  --   --   --   --   PLT 277  --   --   --   --   APTT  --   --   --   --  44*  CREATININE 1.22  --   --   --   --   TROPONINIHS 12 82* 430* 749*  --     Estimated Creatinine Clearance: 97.7 mL/min (by C-G formula based on SCr of 1.22 mg/dL).  Assessment: 62 YOM visit ER with c/o Afib, dizziness, and DOE. Denies CP. Patient on Eliquis PTA for AF. CBC is stable. No signs of bleeding per RN.  AM aPTT 44 seconds on 1700 units/hr (subtherapeutic). Per RN, no issues with heparin running continuously or signs/symptoms of bleeding.  Goal of Therapy:  Heparin level 0.3-0.7 units/ml aPTT 66-102 seconds Monitor platelets by anticoagulation protocol: Yes   Plan:  Increase heparin infusion to 2100 units/hr Check aPTT level in 6 hours and daily while on heparin Continue to monitor H&H and platelets  Arabella Merles, PharmD. Clinical Pharmacist 03/26/2023 5:00 AM

## 2023-03-27 ENCOUNTER — Inpatient Hospital Stay (HOSPITAL_COMMUNITY): Admitting: Anesthesiology

## 2023-03-27 ENCOUNTER — Other Ambulatory Visit: Payer: Self-pay | Admitting: Physician Assistant

## 2023-03-27 ENCOUNTER — Encounter (HOSPITAL_COMMUNITY)
Admission: EM | Disposition: A | Discharge status: Home / Self Care | Payer: Self-pay | Source: Home / Self Care | Attending: Cardiology

## 2023-03-27 ENCOUNTER — Inpatient Hospital Stay (HOSPITAL_COMMUNITY)

## 2023-03-27 ENCOUNTER — Encounter (HOSPITAL_COMMUNITY): Payer: Self-pay | Admitting: Cardiology

## 2023-03-27 ENCOUNTER — Ambulatory Visit: Payer: Medicare HMO | Admitting: Cardiology

## 2023-03-27 DIAGNOSIS — F419 Anxiety disorder, unspecified: Secondary | ICD-10-CM | POA: Diagnosis not present

## 2023-03-27 DIAGNOSIS — I340 Nonrheumatic mitral (valve) insufficiency: Secondary | ICD-10-CM | POA: Diagnosis not present

## 2023-03-27 DIAGNOSIS — I214 Non-ST elevation (NSTEMI) myocardial infarction: Secondary | ICD-10-CM | POA: Diagnosis not present

## 2023-03-27 DIAGNOSIS — Z79899 Other long term (current) drug therapy: Secondary | ICD-10-CM

## 2023-03-27 DIAGNOSIS — I4891 Unspecified atrial fibrillation: Secondary | ICD-10-CM

## 2023-03-27 DIAGNOSIS — I4819 Other persistent atrial fibrillation: Secondary | ICD-10-CM

## 2023-03-27 DIAGNOSIS — E119 Type 2 diabetes mellitus without complications: Secondary | ICD-10-CM

## 2023-03-27 DIAGNOSIS — I10 Essential (primary) hypertension: Secondary | ICD-10-CM | POA: Diagnosis not present

## 2023-03-27 DIAGNOSIS — I361 Nonrheumatic tricuspid (valve) insufficiency: Secondary | ICD-10-CM | POA: Diagnosis not present

## 2023-03-27 HISTORY — PX: TRANSESOPHAGEAL ECHOCARDIOGRAM (CATH LAB): EP1270

## 2023-03-27 LAB — CBC
HCT: 38.7 % — ABNORMAL LOW (ref 39.0–52.0)
Hemoglobin: 13 g/dL (ref 13.0–17.0)
MCH: 30.1 pg (ref 26.0–34.0)
MCHC: 33.6 g/dL (ref 30.0–36.0)
MCV: 89.6 fL (ref 80.0–100.0)
Platelets: 277 10*3/uL (ref 150–400)
RBC: 4.32 MIL/uL (ref 4.22–5.81)
RDW: 13.4 % (ref 11.5–15.5)
WBC: 5.9 10*3/uL (ref 4.0–10.5)
nRBC: 0 % (ref 0.0–0.2)

## 2023-03-27 LAB — HEPARIN LEVEL (UNFRACTIONATED): Heparin Unfractionated: >1.1 [IU]/mL — ABNORMAL HIGH (ref 0.30–0.70)

## 2023-03-27 LAB — PROTIME-INR
INR: 1.1 (ref 0.8–1.2)
Prothrombin Time: 14.6 s (ref 11.4–15.2)

## 2023-03-27 LAB — GLUCOSE, CAPILLARY: Glucose-Capillary: 135 mg/dL — ABNORMAL HIGH (ref 70–99)

## 2023-03-27 LAB — BASIC METABOLIC PANEL
Anion gap: 12 (ref 5–15)
BUN: 23 mg/dL (ref 8–23)
CO2: 25 mmol/L (ref 22–32)
Calcium: 9.1 mg/dL (ref 8.9–10.3)
Chloride: 102 mmol/L (ref 98–111)
Creatinine, Ser: 1.3 mg/dL — ABNORMAL HIGH (ref 0.61–1.24)
GFR, Estimated: >60 mL/min (ref >60)
Glucose, Bld: 152 mg/dL — ABNORMAL HIGH (ref 70–99)
Potassium: 4.3 mmol/L (ref 3.5–5.1)
Sodium: 139 mmol/L (ref 135–145)

## 2023-03-27 LAB — APTT: aPTT: 50 s — ABNORMAL HIGH (ref 24–36)

## 2023-03-27 LAB — ECHO TEE

## 2023-03-27 SURGERY — TRANSESOPHAGEAL ECHOCARDIOGRAM (TEE) (CATHLAB)
Anesthesia: Monitor Anesthesia Care

## 2023-03-27 MED ORDER — WARFARIN - PHARMACIST DOSING INPATIENT: Freq: Every day | Status: DC

## 2023-03-27 MED ORDER — WARFARIN SODIUM 7.5 MG PO TABS: 7.5000 mg | ORAL_TABLET | Freq: Once | ORAL | Status: DC

## 2023-03-27 MED ORDER — DILTIAZEM HCL ER 60 MG PO CP12
60.0000 mg | ORAL_CAPSULE | Freq: Two times a day (BID) | ORAL | Status: DC
Start: 2023-03-27 — End: 2023-04-01
  Administered 2023-03-27 – 2023-04-01 (×11): 60 mg via ORAL
  Filled 2023-03-27 (×12): qty 1

## 2023-03-27 MED ORDER — PROPOFOL 10 MG/ML IV BOLUS
INTRAVENOUS | Status: DC | PRN
  Administered 2023-03-27: 150 ug/kg/min via INTRAVENOUS

## 2023-03-27 MED ORDER — WARFARIN SODIUM 5 MG PO TABS
10.0000 mg | ORAL_TABLET | Freq: Once | ORAL | Status: AC
  Administered 2023-03-27: 10 mg via ORAL
  Filled 2023-03-27: qty 2

## 2023-03-27 MED ORDER — LIDOCAINE 2% (20 MG/ML) 5 ML SYRINGE
INTRAMUSCULAR | Status: DC | PRN
  Administered 2023-03-27: 60 mg via INTRAVENOUS

## 2023-03-27 MED ORDER — DILTIAZEM HCL 60 MG PO TABS: 60.0000 mg | ORAL_TABLET | Freq: Two times a day (BID) | ORAL | Status: DC

## 2023-03-27 MED ORDER — HEPARIN (PORCINE) 25000 UT/250ML-% IV SOLN
2200.0000 [IU]/h | INTRAVENOUS | Status: DC
  Administered 2023-03-27: 2300 [IU]/h via INTRAVENOUS
  Administered 2023-03-28: 2500 [IU]/h via INTRAVENOUS
  Administered 2023-03-28: 2600 [IU]/h via INTRAVENOUS
  Administered 2023-03-28: 2500 [IU]/h via INTRAVENOUS
  Administered 2023-03-29 – 2023-03-31 (×4): 2600 [IU]/h via INTRAVENOUS
  Administered 2023-03-31 – 2023-04-01 (×2): 2200 [IU]/h via INTRAVENOUS
  Filled 2023-03-27 (×11): qty 250

## 2023-03-27 NOTE — Progress Notes (Signed)
 Echocardiogram Echocardiogram Transesophageal has been performed.  Lucendia Herrlich 03/27/2023, 10:31 AM

## 2023-03-27 NOTE — CV Procedure (Addendum)
    TRANSESOPHAGEAL ECHOCARDIOGRAM   NAME:  Bradley Rodriguez    MRN: [id number] DOB:  02/13/1960    ADMIT DATE: 03/25/2023  INDICATIONS: Atrial fibrillation  PROCEDURE:   Informed consent was obtained prior to the procedure. The risks, benefits and alternatives for the procedure were discussed and the patient comprehended these risks.  Risks include, but are not limited to, cough, sore throat, vomiting, nausea, somnolence, esophageal and stomach trauma or perforation, bleeding, low blood pressure, aspiration, pneumonia, infection, trauma to the teeth and death.    Procedural time out performed. The oropharynx was anesthetized with viscous lidocaine.  Anesthesia was administered by the anaesthesilogy team.to achieve and maintain moderate to deep conscious sedation.  The patient's heart rate, blood pressure, and oxygen saturation were monitored continuously during the procedure.  The transesophageal probe was inserted in the esophagus and stomach without difficulty and multiple views were obtained.   The patient tolerated the procedure well.  COMPLICATIONS:    There were no immediate complications.  KEY FINDINGS:  Mildly depressed ejection fraction, mild to moderate mitral regurgitation, left atrial clot with clot coming out of the left atrial appendage closure device/Watchman device. Cardioversion not performed. Full report to follow. Further management per primary team.   Thomasene Ripple, DO [facility]  [facility]  10:21 AM

## 2023-03-27 NOTE — Progress Notes (Cosign Needed Addendum)
 TEE: Mildly depressed ejection fraction, mild to moderate mitral regurgitation, left atrial clot with clot coming out of the left atrial appendage closure device/Watchman device. Cardioversion not performed. Full report to follow. Further management per primary team.  Discussed with Dr. Jimmey Ralph Some concern perhaps that given his body habitus, Eliquis failed, concerns with the others may be as ineffective and recommends warfarin Also will add small dose dilt for better rate control No AAD at this time, with plan to repeat TEE after 4 weeks of therapeutic INRs  Would be reasonable to do heparin > therapeutic INR staying in patient or lovenox to therapeutic INR going home  The patient is undecided, naturally concerned about having a clot and potential consequences with it Right now not really sure which route he is most comfortable with   For now/today/tonight we will proceed with heparin start warfarin tonight I have consulted pharmacy  Bradley Dowse, PA-C

## 2023-03-27 NOTE — Progress Notes (Addendum)
 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  Allergies  Allergen Reactions   Ace Inhibitors Cough   Cozaar [Losartan Potassium] Other (See Comments)    Hyperkalemia    Xarelto [Rivaroxaban] Other (See Comments)    Heart out of rhythm    Zestril [Lisinopril] Cough   Quinine Derivatives Rash    Patient Measurements: Height: 6\' 7"  (200.7 cm) Weight: (!) 140.3 kg (309 lb 4.9 oz) IBW/kg (Calculated) : 93.7 Heparin Dosing Weight: 124.1 kg  Vital Signs: Temp: 98.1 F (36.7 C) (03/05 1102) Temp Source: Oral (03/05 1102) BP: 133/87 (03/05 1102) Pulse Rate: 70 (03/05 1102)  Labs: Recent Labs    03/25/23 0941 03/25/23 1129 03/25/23 1748 03/25/23 2112 03/26/23 0429 03/26/23 1051 03/27/23 0215  HGB 13.6  --   --   --   --  12.6* 13.0  HCT 40.7  --   --   --   --  37.5* 38.7*  PLT 277  --   --   --   --  278 277  APTT  --   --   --   --  44* 52*  --   CREATININE 1.22  --   --   --   --   --  1.30*  TROPONINIHS 12 82* 430* 749*  --   --   --     Estimated Creatinine Clearance: 91.2 mL/min (A) (by C-G formula based on SCr of 1.3 mg/dL (H)).   Medical History: Past Medical History:  Diagnosis Date   A-fib [facility])    DCCV 2009, 2012, 2014, Nov 2016   Anxiety    Arthritis    Knees, wrist, ankles   Cancer (HCC) 07/2019   Kidney   Colon cancer (HCC)    Diabetes mellitus without complication (HCC)    Dysrhythmia 1996   A- Fib   GERD (gastroesophageal reflux disease)    History of kidney stones    HX: anticoagulation    very remotely and briefly on COumadin aound one of his CV, 2014 on Eliquis had hematuria with renal calculi and stopped   Hyperlipidemia    patient denies this   Hypertension    IBS (irritable bowel syndrome)    Normal cardiac stress test    Normal nuclear stress test , 2001   Presence of Watchman left atrial appendage closure device  02/21/2023   31mm Watchman FLX Pro device placed during concomitant AF ablation/LAAO with Dr. Jimmey Ralph.   S/P ablation of atrial fibrillation 02/21/2023   With Dr. Jimmey Ralph    Medications:  Medications Prior to Admission  Medication Sig Dispense Refill Last Dose/Taking   acetaminophen (TYLENOL) 500 MG tablet Take 1,500 mg by mouth daily as needed for moderate pain (pain score 4-6), fever or headache.   Past Week   amLODipine (NORVASC) 5 MG tablet Take 1 tablet (5 mg total) by mouth daily. (Patient taking differently: Take 5 mg by mouth at bedtime.) 90 tablet 2 03/24/2023 Bedtime   apixaban (ELIQUIS) 5 MG TABS tablet Take 1 tablet (5 mg total) by mouth 2 (two) times daily. 60 tablet 3 03/25/2023 at  7:00 AM   atorvastatin (LIPITOR) 20 MG tablet Take 1 tablet (20 mg total) by mouth at bedtime. (Patient taking differently: Take 20 mg by mouth daily.) 90 tablet 1 03/25/2023 Morning   Cholecalciferol (VITAMIN D-3 PO) Take 1 capsule by mouth daily.   03/25/2023 Morning  DULoxetine (CYMBALTA) 60 MG capsule Take 60 mg by mouth in the morning.   03/25/2023 Morning   MAGNESIUM PO Take 1 tablet by mouth at bedtime.   03/24/2023 Bedtime   metoprolol tartrate (LOPRESSOR) 100 MG tablet TAKE ONE TABLET BY MOUTH TWICE DAILY 180 tablet 1 03/25/2023 Morning   Multiple Vitamins-Minerals (ZINC PO) Take 1 tablet by mouth daily.   03/25/2023 Morning   OVER THE COUNTER MEDICATION Take 1 capsule by mouth 2 (two) times daily. OTC supplement "Glucocil"   03/25/2023 Morning    Assessment: 62 YOM s/p TEE with noted left atrial clot w/ clot coming out of the left atrial appendage closure device/watchman device. This is considered an apixaban failure. Last dose of apixaban given 3/5 at 08:21. He was on a hep gtt prior to apixaban dose given this morning. Restarted at last known rate He has a history of using warfarin from February 2023. Last known dose was 10 mg on Monday, Wednesday, Friday, and Saturday. 7.5 mg on Sunday, Tuesday, and  Thursday. This regimen resulted in an INR ~2.4  Goal of Therapy:  INR 2-3 Heparin level 0.3-0.7 units/ml aPTT 66-102 seconds Monitor platelets by anticoagulation protocol: Yes   Plan:  STAT baseline INR restart heparin 2300 units/hr now (last infusion rate prior to apixaban order) Give warfarin 10 mg po x1 dose this evening Heparin level and aPTT at 2100 Heparin level, aPTT, INR, and CBC daily. May DC aPTT once correlates with heparin level May DC heparin once INR is therapeutic for 24 hours Monitor for signs of bleeding / pauses with heparin infusion   Nessa Ramaker BS, PharmD, BCPS Clinical Pharmacist 03/27/2023 1:17 PM  Contact: [phone] after 3 PM  "Be curious, not judgmental..." -Debbora Dus

## 2023-03-27 NOTE — Discharge Instructions (Addendum)
Information on my medicine - ELIQUIS? (apixaban) ? ?This medication education was reviewed with me or my healthcare representative as part of my discharge preparation.  ? ?Why was Eliquis? prescribed for you? ?Eliquis? was prescribed for you to reduce the risk of forming blood clots that can cause a stroke if you have a medical condition called atrial fibrillation (a type of irregular heartbeat) OR to reduce the risk of a blood clots forming after orthopedic surgery. ? ?What do You need to know about Eliquis? ? ?Take your Eliquis? TWICE DAILY - one tablet in the morning and one tablet in the evening with or without food.  It would be best to take the doses about the same time each day. ? ?If you have difficulty swallowing the tablet whole please discuss with your pharmacist how to take the medication safely. ? ?Take Eliquis? exactly as prescribed by your doctor and DO NOT stop taking Eliquis? without talking to the doctor who prescribed the medication.  Stopping may increase your risk of developing a new clot or stroke.  Refill your prescription before you run out. ? ?After discharge, you should have regular check-up appointments with your healthcare provider that is prescribing your Eliquis?.  In the future your dose may need to be changed if your kidney function or weight changes by a significant amount or as you get older. ? ?What do you do if you miss a dose? ?If you miss a dose, take it as soon as you remember on the same day and resume taking twice daily.  Do not take more than one dose of ELIQUIS at the same time. ? ?Important Safety Information ?A possible side effect of Eliquis? is bleeding. You should call your healthcare provider right away if you experience any of the following: ?Bleeding from an injury or your nose that does not stop. ?Unusual colored urine (red or dark brown) or unusual colored stools (red or black). ?Unusual bruising for unknown reasons. ?A serious fall or if you hit your head (even  if there is no bleeding). ? ?Some medicines may interact with Eliquis? and might increase your risk of bleeding or clotting while on Eliquis?. To help avoid this, consult your healthcare provider or pharmacist prior to using any new prescription or non-prescription medications, including herbals, vitamins, non-steroidal anti-inflammatory drugs (NSAIDs) and supplements. ? ?This website has more information on Eliquis? (apixaban): http://www.eliquis.com/eliquis/home ?  ?

## 2023-03-27 NOTE — Transfer of Care (Signed)
 Immediate Anesthesia Transfer of Care Note  Patient: Bradley Rodriguez  Procedure(s) Performed: TRANSESOPHAGEAL ECHOCARDIOGRAM  Patient Location: Cath Lab  Anesthesia Type:MAC  Level of Consciousness: awake, alert , oriented, and patient cooperative  Airway & Oxygen Therapy: Patient Spontanous Breathing and Patient connected to face mask oxygen  Post-op Assessment: Report given to RN, Post -op Vital signs reviewed and stable, Patient moving all extremities, Patient moving all extremities X 4, and Patient able to stick tongue midline  Post vital signs: Reviewed and stable  Last Vitals:  Vitals Value Taken Time  BP    Temp    Pulse 82 03/27/23 1016  Resp 19 03/27/23 1016  SpO2 97 % 03/27/23 1016  Vitals shown include unfiled device data.  Last Pain:  Vitals:   03/27/23 0834  TempSrc: Temporal  PainSc:          Complications: No notable events documented.

## 2023-03-27 NOTE — TOC CM/SW Note (Signed)
 Transition of Care [facility]) - Inpatient Brief Assessment   Patient Details  Name: Bradley Rodriguez MRN: [id number] Date of Birth: Nov 19, 1960  [facility] of [facility]) CM/SW Contact:    Harriet Masson, RN Phone Number: 03/27/2023, 12:02 PM   Clinical Narrative:  Patient had heart catheter 03/26/23 and has TEE scheduled for today.  No TOC needs at this time.   Transition of Care Asessment: Insurance and Status: Insurance coverage has been reviewed Patient has primary care physician: Yes Home environment has been reviewed: safe to discharge home when medically stable Prior level of function:: independent Prior/Current Home Services: No current home services Social Drivers of Health Review: SDOH reviewed no interventions necessary   Transition of care needs: no transition of care needs at this time

## 2023-03-27 NOTE — Anesthesia Preprocedure Evaluation (Signed)
 Anesthesia Evaluation  Patient identified by MRN, date of birth, ID band Patient awake    Reviewed: Allergy & Precautions, NPO status , Patient's Chart, lab work & pertinent test results  Airway Mallampati: III  TM Distance: >3 FB Neck ROM: Full    Dental no notable dental hx.    Pulmonary neg pulmonary ROS   Pulmonary exam normal        Cardiovascular hypertension, Pt. on medications and Pt. on home beta blockers + dysrhythmias Atrial Fibrillation  Rhythm:Irregular Rate:Normal     Neuro/Psych   Anxiety     negative neurological ROS     GI/Hepatic Neg liver ROS,GERD  ,,  Endo/Other  diabetes    Renal/GU   negative genitourinary   Musculoskeletal  (+) Arthritis , Osteoarthritis,    Abdominal Normal abdominal exam  (+)   Peds  Hematology  (+) Blood dyscrasia, anemia   Anesthesia Other Findings   Reproductive/Obstetrics                             Anesthesia Physical Anesthesia Plan  ASA: 3  Anesthesia Plan: MAC   Post-op Pain Management:    Induction: Intravenous  PONV Risk Score and Plan: 1 and Propofol infusion and Treatment may vary due to age or medical condition  Airway Management Planned: Simple Face Mask and Nasal Cannula  Additional Equipment: None  Intra-op Plan:   Post-operative Plan:   Informed Consent: I have reviewed the patients History and Physical, chart, labs and discussed the procedure including the risks, benefits and alternatives for the proposed anesthesia with the patient or authorized representative who has indicated his/her understanding and acceptance.     Dental advisory given  Plan Discussed with: CRNA  Anesthesia Plan Comments:        Anesthesia Quick Evaluation

## 2023-03-27 NOTE — Progress Notes (Addendum)
 Rounding Note    Patient Name: Bradley Rodriguez Date of Encounter: 03/27/2023  [facility] Cardiologist: Dina Rich, MD   Subjective    No ongoing complaints  Inpatient Medications    Scheduled Meds:  apixaban  5 mg Oral BID   aspirin  81 mg Oral Daily   atorvastatin  20 mg Oral QHS   DULoxetine  60 mg Oral q AM   metoprolol tartrate  100 mg Oral BID   sodium chloride flush  3 mL Intravenous Q12H   sodium chloride flush  3-10 mL Intravenous Q12H   Continuous Infusions:  sodium chloride     heparin 2,300 Units/hr (03/27/23 0535)   PRN Meds: sodium chloride, acetaminophen, nitroGLYCERIN, sodium chloride flush, sodium chloride flush   Vital Signs    Vitals:   03/26/23 1702 03/26/23 1938 03/27/23 0400 03/27/23 0743  BP: (!) 143/90 (!) 130/92 (!) 123/97 122/83  Pulse:  80 79 77  Resp: 17 17 14 17   Temp: 98.1 F (36.7 C) 98.3 F (36.8 C)  98.2 F (36.8 C)  TempSrc: Oral Oral  Oral  SpO2:  95% 97% 93%  Weight:      Height:        Intake/Output Summary (Last 24 hours) at 03/27/2023 0800 Last data filed at 03/27/2023 0535 Gross per 24 hour  Intake 1463.04 ml  Output --  Net 1463.04 ml      03/26/2023   10:12 AM 03/25/2023    9:28 AM 03/04/2023    3:23 PM  Last 3 Weights  Weight (lbs) 309 lb 4.9 oz 305 lb 317 lb 3.2 oz  Weight (kg) 140.3 kg 138.347 kg 143.881 kg      Telemetry    AFib 100's-110's mostly - Personally Reviewed  ECG    No new EKGs - Personally Reviewed  Physical Exam    GEN: No acute distress.   Neck: No JVD Cardiac: irreg-irreg, no murmurs, rubs, or gallops.  Respiratory: CTA b/l. GI: Soft, nontender, non-distended  MS: No edema; No deformity. Neuro:  Nonfocal  Psych: Normal affect   R radial site is stable, good pulse, no bleeding or hematoma  Labs    High Sensitivity Troponin:   Recent Labs  Lab 03/25/23 0941 03/25/23 1129 03/25/23 1748 03/25/23 2112  TROPONINIHS 12 82* 430* 749*     Chemistry Recent Labs   Lab 03/25/23 0941 03/27/23 0215  NA 139 139  K 4.9 4.3  CL 102 102  CO2 23 25  GLUCOSE 185* 152*  BUN 21 23  CREATININE 1.22 1.30*  CALCIUM 9.5 9.1  GFRNONAA >60 >60  ANIONGAP 14 12    Lipids No results for input(s): "CHOL", "TRIG", "HDL", "LABVLDL", "LDLCALC", "CHOLHDL" in the last 168 hours.  Hematology Recent Labs  Lab 03/25/23 0941 03/26/23 1051 03/27/23 0215  WBC 8.9 6.3 5.9  RBC 4.52 4.22 4.32  HGB 13.6 12.6* 13.0  HCT 40.7 37.5* 38.7*  MCV 90.0 88.9 89.6  MCH 30.1 29.9 30.1  MCHC 33.4 33.6 33.6  RDW 13.5 13.4 13.4  PLT 277 278 277   Thyroid No results for input(s): "TSH", "FREET4" in the last 168 hours.  BNPNo results for input(s): "BNP", "PROBNP" in the last 168 hours.  DDimer No results for input(s): "DDIMER" in the last 168 hours.   Radiology    CARDIAC CATHETERIZATION Result Date: 03/26/2023   Prox RCA lesion is 20% stenosed.   2nd Mrg lesion is 20% stenosed.   Dist LAD lesion is  20% stenosed. Mild non-obstructive CAD LVEDP 15 mmHg Recommendations: Medical management of mild CAD.   DG Chest 2 View Result Date: 03/25/2023 CLINICAL DATA:  Chest pain EXAM: CHEST - 2 VIEW COMPARISON:  X-ray 02/21/2023 FINDINGS: The right inferior costophrenic angle is clipped off the edge of the film. No consolidation, pneumothorax or effusion. No edema. Normal cardiopericardial silhouette. Calcified aorta. Dense tiny right apical lung nodule, based on appearance likely calcified and related to old granulomatous disease or other process. IMPRESSION: No acute cardiopulmonary disease. The right inferior costophrenic angle is clipped off the edge of the film. Electronically Signed   By: Karen Kays M.D.   On: 03/25/2023 11:16    Cardiac Studies   02/21/23: EPS/ablation/watchman CONCLUSIONS: 1. Successful PVI 2. Successful ablation/isolation of the posterior wall 3. Intracardiac echo reveals normal LV size and function, trivial pericardial effusion, 4 distinct pulmonary veins. 4.  Successful implantation of a 31mm WATCHMAN FLX PRO left atrial appendage occlusive device    5. No early apparent complications.   Post Implant Anticoagulation Strategy: Continue Eliquis 5mg  twice daily for 3 months then transition to dual anti-platelet therapy.      02/21/23: TEE  1. TEE guided LAA closure. 31 mm Watchman FLX placed with 25%  compression. No device leak. Small iatrogenic ASD with L to R shunting  after the procedure. Trivial pericardial effusion before and after the  procedure. 3D echo confirmed device size and  correct placement. No immediate complications.   2. Left ventricular ejection fraction, by estimation, is 50 to 55%. The  left ventricle has low normal function.   3. Right ventricular systolic function is mildly reduced. The right  ventricular size is mildly enlarged.   4. Left atrial size was mildly dilated. No left atrial/left atrial  appendage thrombus was detected.   5. The mitral valve is grossly normal. Mild to moderate mitral valve  regurgitation. No evidence of mitral stenosis.   6. The aortic valve is tricuspid. Aortic valve regurgitation is not  visualized. No aortic stenosis is present.   7. There is mild (Grade II) layered plaque involving the descending  aorta.      02/01/23: Cardiac CT IMPRESSION: 1. The left atrial appendage is a large chicken wing morphology without thrombus.   2. A 31 mm Watchman FLX device is recommended based on the above landing zone measurements.   3. A mid/mid IAS puncture site is recommended.   4. Optimal deployment angle: RAO 10 CRA 15   5. Small PFO.   6. CAC score of 253, which is 73rd percentile for age-, sex-, and race-matched controls.   7. Mildly dilated aortic root up to 43 mm.  Patient Profile     63 y.o. male HTN, HLD, DM, colon Ca/GIB, AFib admitted with symptomatic Afib, CP  Recently: AFib ablation with watchman implant 02/21/23   Assessment & Plan    Persistent Afib CHA2DS2Vasc is 2, on  eliquis, appropriately dosed Eliquis held > heparin Last dose yesterday morning at home Fair rate control  90's100's > continue his home lopressor  TEE/DCCV today to confirm no thrombus on watchman device especially (no thrombus at all) prior to DCCV Will plan Flecainide 150mg  BID post DCCV ETT in a week AFib clinic in 2 weeks Suspect discharge this afternoon   Informed Consent   Shared Decision Making/Informed Consent   The risks [stroke, cardiac arrhythmias rarely resulting in the need for a temporary or permanent pacemaker, skin irritation or burns, esophageal damage, perforation (1:10,000 risk),  bleeding, pharyngeal hematoma as well as other potential complications associated with conscious sedation including aspiration, arrhythmia, respiratory failure and death], benefits (treatment guidance, restoration of normal sinus rhythm, diagnostic support) and alternatives of a transesophageal echocardiogram guided cardioversion were discussed in detail with Mr. Bertino and he is willing to proceed.        Dizzy More so off balance when up and around this morning Doesn't drink much water/or anything really Labs look OK Not an ongoing complaint    NSTEMI Denies this as a chest pain Similar symptom for 2-3 days post procedure HS Trop behavior, unusual at  12 >> 82 > 430 > 749 Vague nondescript complaint denies ongoing here HS Trops c/w NTEMI LHC 03/26/23 > with no obstructive CAD    For questions or updates, please contact [facility] Please consult [email or web address] for contact info under        Signed, Sheilah Pigeon, PA-C  03/27/2023, 8:00 AM     I have seen, examined the patient, and reviewed the above assessment and plan.    Interval: No acute overnight events. Remains in AF. No new or acute complaints.  GEN: No acute distress.   Cardiac: Normal rate, irregular rhythm.  Resp: Normal work of breathing.  Ext: No edema. Psych: Normal affect   Assessment and plan:   #. Persistent atrial fibrillation: Has history of longstanding persistent atrial fibrillation and has failed antiarrhythmic drug therapy previously.  Maintained sinus rhythm for approximately 24 days after catheter ablation.  Unfortunately now with early recurrence. #. Watchman device with device related thrombus: Underwent TEE today with well seated Watchman device with no leak; however, device related thrombus was seen on surface of Watchman device.  -No plans for cardioversion or anti-arrhythmics due to thrombus. Will rate control with metoprolol and diltiazem.  -Stop Eliquis. Restart IV heparin with bridging to warfarin for now.    Nobie Putnam, MD 03/27/2023 11:41 PM

## 2023-03-27 NOTE — Plan of Care (Signed)

## 2023-03-27 NOTE — Anesthesia Postprocedure Evaluation (Signed)
 Anesthesia Post Note  Patient: Bradley Rodriguez  Procedure(s) Performed: TRANSESOPHAGEAL ECHOCARDIOGRAM     Patient location during evaluation: PACU Anesthesia Type: MAC Level of consciousness: awake and alert Pain management: pain level controlled Vital Signs Assessment: post-procedure vital signs reviewed and stable Respiratory status: spontaneous breathing, nonlabored ventilation, respiratory function stable and patient connected to nasal cannula oxygen Cardiovascular status: stable and blood pressure returned to baseline Postop Assessment: no apparent nausea or vomiting Anesthetic complications: no   No notable events documented.  Last Vitals:  Vitals:   03/27/23 1102 03/27/23 1348  BP: 133/87 133/87  Pulse: 70   Resp: 16   Temp: 36.7 C   SpO2: 97%     Last Pain:  Vitals:   03/27/23 1102  TempSrc: Oral  PainSc: 0-No pain                 Earl Lites P Aoki Wedemeyer

## 2023-03-27 NOTE — Plan of Care (Signed)
  Problem: Clinical Measurements: Goal: Will remain free from infection Outcome: Progressing Goal: Respiratory complications will improve Outcome: Progressing   Problem: Activity: Goal: Risk for activity intolerance will decrease Outcome: Progressing   Problem: Nutrition: Goal: Adequate nutrition will be maintained Outcome: Progressing   

## 2023-03-27 NOTE — Progress Notes (Signed)
 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  Allergies  Allergen Reactions   Ace Inhibitors Cough   Cozaar [Losartan Potassium] Other (See Comments)    Hyperkalemia    Xarelto [Rivaroxaban] Other (See Comments)    Heart out of rhythm    Zestril [Lisinopril] Cough   Quinine Derivatives Rash    Patient Measurements: Height: 6\' 7"  (200.7 cm) Weight: (!) 140.3 kg (309 lb 4.9 oz) IBW/kg (Calculated) : 93.7 Heparin Dosing Weight: 124.1 kg  Vital Signs: Temp: 98.7 F (37.1 C) (03/05 1950) Temp Source: Oral (03/05 1950) BP: 142/82 (03/05 1950) Pulse Rate: 90 (03/05 1950)  Labs: Recent Labs    03/25/23 0941 03/25/23 1129 03/25/23 1748 03/25/23 2112 03/26/23 0429 03/26/23 1051 03/27/23 0215 03/27/23 1405 03/27/23 2058  HGB 13.6  --   --   --   --  12.6* 13.0  --   --   HCT 40.7  --   --   --   --  37.5* 38.7*  --   --   PLT 277  --   --   --   --  278 277  --   --   APTT  --   --   --   --  44* 52*  --   --  50*  LABPROT  --   --   --   --   --   --   --  14.6  --   INR  --   --   --   --   --   --   --  1.1  --   HEPARINUNFRC  --   --   --   --   --   --   --   --  >1.10*  CREATININE 1.22  --   --   --   --   --  1.30*  --   --   TROPONINIHS 12 82* 430* 749*  --   --   --   --   --     Estimated Creatinine Clearance: 91.2 mL/min (A) (by C-G formula based on SCr of 1.3 mg/dL (H)).   Medical History: Past Medical History:  Diagnosis Date   A-fib [facility])    DCCV 2009, 2012, 2014, Nov 2016   Anxiety    Arthritis    Knees, wrist, ankles   Cancer (HCC) 07/2019   Kidney   Colon cancer (HCC)    Diabetes mellitus without complication (HCC)    Dysrhythmia 1996   A- Fib   GERD (gastroesophageal reflux disease)    History of kidney stones    HX: anticoagulation    very remotely and briefly on COumadin aound one of his CV, 2014 on Eliquis had hematuria with  renal calculi and stopped   Hyperlipidemia    patient denies this   Hypertension    IBS (irritable bowel syndrome)    Normal cardiac stress test    Normal nuclear stress test , 2001   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 Jimmey Ralph.   S/P ablation of atrial fibrillation 02/21/2023   With Dr. Jimmey Ralph    Medications:  Medications Prior to Admission  Medication Sig Dispense Refill Last Dose/Taking   acetaminophen (TYLENOL) 500 MG tablet Take 1,500 mg by mouth daily as needed for moderate  pain (pain score 4-6), fever or headache.   Past Week   amLODipine (NORVASC) 5 MG tablet Take 1 tablet (5 mg total) by mouth daily. (Patient taking differently: Take 5 mg by mouth at bedtime.) 90 tablet 2 03/24/2023 Bedtime   apixaban (ELIQUIS) 5 MG TABS tablet Take 1 tablet (5 mg total) by mouth 2 (two) times daily. 60 tablet 3 03/25/2023 at  7:00 AM   atorvastatin (LIPITOR) 20 MG tablet Take 1 tablet (20 mg total) by mouth at bedtime. (Patient taking differently: Take 20 mg by mouth daily.) 90 tablet 1 03/25/2023 Morning   Cholecalciferol (VITAMIN D-3 PO) Take 1 capsule by mouth daily.   03/25/2023 Morning   DULoxetine (CYMBALTA) 60 MG capsule Take 60 mg by mouth in the morning.   03/25/2023 Morning   MAGNESIUM PO Take 1 tablet by mouth at bedtime.   03/24/2023 Bedtime   metoprolol tartrate (LOPRESSOR) 100 MG tablet TAKE ONE TABLET BY MOUTH TWICE DAILY 180 tablet 1 03/25/2023 Morning   Multiple Vitamins-Minerals (ZINC PO) Take 1 tablet by mouth daily.   03/25/2023 Morning   OVER THE COUNTER MEDICATION Take 1 capsule by mouth 2 (two) times daily. OTC supplement "Glucocil"   03/25/2023 Morning    Assessment: 62 YOM s/p TEE with noted left atrial clot w/ clot coming out of the left atrial appendage closure device/watchman device. This is considered an apixaban failure. Last dose of apixaban given 3/5 at 08:21. He was  on a hep gtt prior to apixaban dose given this morning. Restarted at last known rate He has a history of using warfarin from February 2023. Last known dose was 10 mg on Monday, Wednesday, Friday, and Saturday. 7.5 mg on Sunday, Tuesday, and Thursday. This regimen resulted in an INR ~2.4  Heparin drip 2300 uts/hr with aptt 50sec (Heparin level > 1.1- as expected after apixaban) no bleeding noted   Goal of Therapy:  INR 2-3 Heparin level 0.3-0.7 units/ml aPTT 66-102 seconds Monitor platelets by anticoagulation protocol: Yes   Plan:  Increase Heparin drip 2500 uts/hr  Heparin level, aPTT, INR, and CBC daily. May DC aPTT once correlates with heparin level May DC heparin once INR is therapeutic for 24 hours Monitor for signs of bleeding / pauses with heparin infusion    Leota Sauers Pharm.D. CPP, BCPS Clinical Pharmacist [phone] 03/27/2023 10:20 PM

## 2023-03-28 DIAGNOSIS — I4891 Unspecified atrial fibrillation: Secondary | ICD-10-CM | POA: Diagnosis not present

## 2023-03-28 DIAGNOSIS — I214 Non-ST elevation (NSTEMI) myocardial infarction: Secondary | ICD-10-CM | POA: Diagnosis not present

## 2023-03-28 LAB — APTT
aPTT: 66 s — ABNORMAL HIGH (ref 24–36)
aPTT: 62 s — ABNORMAL HIGH (ref 24–36)
aPTT: 80 s — ABNORMAL HIGH (ref 24–36)

## 2023-03-28 LAB — BASIC METABOLIC PANEL
Anion gap: 8 (ref 5–15)
BUN: 21 mg/dL (ref 8–23)
CO2: 23 mmol/L (ref 22–32)
Calcium: 8.7 mg/dL — ABNORMAL LOW (ref 8.9–10.3)
Chloride: 105 mmol/L (ref 98–111)
Creatinine, Ser: 1.21 mg/dL (ref 0.61–1.24)
GFR, Estimated: >60 mL/min (ref >60)
Glucose, Bld: 165 mg/dL — ABNORMAL HIGH (ref 70–99)
Potassium: 4 mmol/L (ref 3.5–5.1)
Sodium: 136 mmol/L (ref 135–145)

## 2023-03-28 LAB — CBC
HCT: 37.9 % — ABNORMAL LOW (ref 39.0–52.0)
Hemoglobin: 12.7 g/dL — ABNORMAL LOW (ref 13.0–17.0)
MCH: 30 pg (ref 26.0–34.0)
MCHC: 33.5 g/dL (ref 30.0–36.0)
MCV: 89.4 fL (ref 80.0–100.0)
Platelets: 277 10*3/uL (ref 150–400)
RBC: 4.24 MIL/uL (ref 4.22–5.81)
RDW: 13.3 % (ref 11.5–15.5)
WBC: 5.2 10*3/uL (ref 4.0–10.5)
nRBC: 0 % (ref 0.0–0.2)

## 2023-03-28 LAB — HEPARIN LEVEL (UNFRACTIONATED): Heparin Unfractionated: >1.1 [IU]/mL — ABNORMAL HIGH (ref 0.30–0.70)

## 2023-03-28 LAB — PROTIME-INR
INR: 1.1 (ref 0.8–1.2)
Prothrombin Time: 14.4 s (ref 11.4–15.2)

## 2023-03-28 MED ORDER — WARFARIN SODIUM 5 MG PO TABS
10.0000 mg | ORAL_TABLET | Freq: Once | ORAL | Status: AC
  Administered 2023-03-28: 10 mg via ORAL
  Filled 2023-03-28: qty 2

## 2023-03-28 NOTE — Progress Notes (Addendum)
 Rounding Note    Patient Name: Bradley Rodriguez Date of Encounter: 03/28/2023  [facility] Cardiologist: Dina Rich, MD   Subjective   No ongoing complaints  Inpatient Medications    Scheduled Meds:  aspirin  81 mg Oral Daily   atorvastatin  20 mg Oral QHS   diltiazem  60 mg Oral Q12H   DULoxetine  60 mg Oral q AM   metoprolol tartrate  100 mg Oral BID   sodium chloride flush  3 mL Intravenous Q12H   Warfarin - Pharmacist Dosing Inpatient   Does not apply q1600   Continuous Infusions:  heparin 2,500 Units/hr (03/28/23 0108)   PRN Meds: acetaminophen, nitroGLYCERIN, sodium chloride flush   Vital Signs    Vitals:   03/27/23 1950 03/27/23 2319 03/28/23 0517 03/28/23 0741  BP: (!) 142/82 110/61 133/85 (!) 125/92  Pulse: 90   73  Resp: 17 15 16 10   Temp: 98.7 F (37.1 C) 98 F (36.7 C) 98.1 F (36.7 C) 97.9 F (36.6 C)  TempSrc: Oral Oral Oral Oral  SpO2: 96%   96%  Weight:      Height:        Intake/Output Summary (Last 24 hours) at 03/28/2023 0911 Last data filed at 03/28/2023 0108 Gross per 24 hour  Intake 1609.34 ml  Output --  Net 1609.34 ml      03/26/2023   10:12 AM 03/25/2023    9:28 AM 03/04/2023    3:23 PM  Last 3 Weights  Weight (lbs) 309 lb 4.9 oz 305 lb 317 lb 3.2 oz  Weight (kg) 140.3 kg 138.347 kg 143.881 kg      Telemetry    AFib 100's-110's mostly - Personally Reviewed  ECG    No new EKGs - Personally Reviewed  Physical Exam    GEN: No acute distress.   Neck: No JVD Cardiac: irreg-irreg, no murmurs, rubs, or gallops.  Respiratory: CTA b/l. GI: Soft, nontender, non-distended  MS: No edema; No deformity. Neuro:  Nonfocal  Psych: Normal affect   Labs    High Sensitivity Troponin:   Recent Labs  Lab 03/25/23 0941 03/25/23 1129 03/25/23 1748 03/25/23 2112  TROPONINIHS 12 82* 430* 749*     Chemistry Recent Labs  Lab 03/25/23 0941 03/27/23 0215 03/28/23 0230  NA 139 139 136  K 4.9 4.3 4.0  CL 102 102  105  CO2 23 25 23   GLUCOSE 185* 152* 165*  BUN 21 23 21   CREATININE 1.22 1.30* 1.21  CALCIUM 9.5 9.1 8.7*  GFRNONAA >60 >60 >60  ANIONGAP 14 12 8     Lipids No results for input(s): "CHOL", "TRIG", "HDL", "LABVLDL", "LDLCALC", "CHOLHDL" in the last 168 hours.  Hematology Recent Labs  Lab 03/26/23 1051 03/27/23 0215 03/28/23 0230  WBC 6.3 5.9 5.2  RBC 4.22 4.32 4.24  HGB 12.6* 13.0 12.7*  HCT 37.5* 38.7* 37.9*  MCV 88.9 89.6 89.4  MCH 29.9 30.1 30.0  MCHC 33.6 33.6 33.5  RDW 13.4 13.4 13.3  PLT 278 277 277   Thyroid No results for input(s): "TSH", "FREET4" in the last 168 hours.  BNPNo results for input(s): "BNP", "PROBNP" in the last 168 hours.  DDimer No results for input(s): "DDIMER" in the last 168 hours.   Radiology      Cardiac Studies   TEE 03/27/23 Mildly depressed ejection fraction, mild to moderate mitral regurgitation, left atrial clot with clot coming out of the left atrial appendage closure device/Watchman device. Cardioversion not  performed. Full report to follow. Further management per primary team.   03/25/23:  LHC   Prox RCA lesion is 20% stenosed.   2nd Mrg lesion is 20% stenosed.   Dist LAD lesion is 20% stenosed.   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 Successful PVI 2. Successful ablation/isolation of the posterior wall 3. Intracardiac echo reveals normal LV size and function, trivial pericardial effusion, 4 distinct pulmonary veins. 4. Successful implantation of a 31mm WATCHMAN FLX PRO left atrial appendage occlusive device    5. No early apparent complications.   Post Implant Anticoagulation Strategy: Continue Eliquis 5mg  twice daily for 3 months then transition to dual anti-platelet therapy.      02/21/23: TEE  1. TEE guided LAA closure. 31 mm Watchman FLX placed with 25%  compression. No device leak. Small iatrogenic ASD with L to R shunting  after the procedure. Trivial pericardial  effusion before and after the  procedure. 3D echo confirmed device size and  correct placement. No immediate complications.   2. Left ventricular ejection fraction, by estimation, is 50 to 55%. The  left ventricle has low normal function.   3. Right ventricular systolic function is mildly reduced. The right  ventricular size is mildly enlarged.   4. Left atrial size was mildly dilated. No left atrial/left atrial  appendage thrombus was detected.   5. The mitral valve is grossly normal. Mild to moderate mitral valve  regurgitation. No evidence of mitral stenosis.   6. The aortic valve is tricuspid. Aortic valve regurgitation is not  visualized. No aortic stenosis is present.   7. There is mild (Grade II) layered plaque involving the descending  aorta.      02/01/23: Cardiac CT IMPRESSION: 1. The left atrial appendage is a large chicken wing morphology without thrombus.   2. A 31 mm Watchman FLX device is recommended based on the above landing zone measurements.   3. A mid/mid IAS puncture site is recommended.   4. Optimal deployment angle: RAO 10 CRA 15   5. Small PFO.   6. CAC score of 253, which is 73rd percentile for age-, sex-, and race-matched controls.   7. Mildly dilated aortic root up to 43 mm.  Patient Profile     63 y.o. male HTN, HLD, DM, colon Ca/GIB, AFib admitted with symptomatic Afib, CP  Recently: AFib ablation with watchman implant 02/21/23   Assessment & Plan    Persistent Afib CHA2DS2Vasc is 2, on eliquis, appropriately dosed Eliquis held > heparin  TEE yesterday noted thrombus (DCCV deferred) Back on heparin gtt concern perhaps that given his body habitus, Eliquis failed, concerns with the other similar agents may be as ineffective planned for warfarin  Revisited options  heparin > therapeutic INR staying in patient or lovenox to therapeutic INR going home  The patient feels most comfortable staying to complete his brdige to therapeutic  INR  Dilt added 3/5 >> HRs much better 80's-100s     Dizzy More so off balance when up the morning of his admission Not an ongoing complaint Dr. Jimmey Ralph today d/w patient concerns his presenting symptom may have been a small stroke Symptoms remains completely resolved +/- TIA Discussed neuro consult and formal evaluation but the patient declines with resolved symptoms    NSTEMI/chest discomfort (?) Denies this as a chest pain Similar symptom for 2-3 days post procedure HS Trop behavior, unusual at  12 >> 82 > 430 > 749 Vague nondescript complaint denies  ongoing here HS Trops c/w NTEMI LHC 3/4 without significant CAD     For questions or updates, please contact [facility] Please consult [email or web address] for contact info under        Signed, Sheilah Pigeon, PA-C  03/28/2023, 9:11 AM     I have seen, examined the patient, and reviewed the above assessment and plan.    Interval: Patient had TEE yesterday with device related thrombus. Discussed findings extensively with patient and his wife today. He reports feeling relatively well but is nervous about the clot. No further dizzy or unsteady episodes since day of admission. No new or acute complaints.  GEN: No acute distress.   Cardiac: Normal rate, irregular rhythm. Resp: Normal work of breathing.  Ext: No edema.  Psych: Normal affect   Assessment and plan:  #. Persistent atrial fibrillation: Has history of longstanding persistent atrial fibrillation and has failed antiarrhythmic drug therapy previously.  Maintained sinus rhythm for approximately 24 days after catheter ablation.  Unfortunately now with early recurrence. Rates improved since starting diltiazem.  - Continue metoprolol 100mg  BID.  - Continue diltiazem 60mg  BID.  #. Watchman device with device related thrombus: Underwent TEE today with well seated Watchman device with no leak; however, device related thrombus was seen on surface of Watchman device.   -No plans for cardioversion or anti-arrhythmics due to thrombus. Will rate control with metoprolol and diltiazem as above.  -Continue IV heparin with bridging to therapeutic warfarin.He was already planned for therapeutic anti-coagulation for at least 90 days after ablation. -Repeat TEE in 4-6 weeks.   Nobie Putnam, MD 03/28/2023 9:11 PM

## 2023-03-28 NOTE — Progress Notes (Signed)
 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  Allergies  Allergen Reactions   Ace Inhibitors Cough   Cozaar [Losartan Potassium] Other (See Comments)    Hyperkalemia    Xarelto [Rivaroxaban] Other (See Comments)    Heart out of rhythm    Zestril [Lisinopril] Cough   Quinine Derivatives Rash    Patient Measurements: Height: 6\' 7"  (200.7 cm) Weight: (!) 140.3 kg (309 lb 4.9 oz) IBW/kg (Calculated) : 93.7 Heparin Dosing Weight: 124.1 kg  Vital Signs: Temp: 98 F (36.7 C) (03/05 2319) Temp Source: Oral (03/05 2319) BP: 110/61 (03/05 2319) Pulse Rate: 90 (03/05 1950)  Labs: Recent Labs    03/25/23 0941 03/25/23 1129 03/25/23 1748 03/25/23 2112 03/26/23 0429 03/26/23 1051 03/27/23 0215 03/27/23 1405 03/27/23 2058 03/28/23 0230  HGB 13.6  --   --   --   --  12.6* 13.0  --   --  12.7*  HCT 40.7  --   --   --   --  37.5* 38.7*  --   --  37.9*  PLT 277  --   --   --   --  278 277  --   --  277  APTT  --   --   --   --    < > 52*  --   --  50* 66*  LABPROT  --   --   --   --   --   --   --  14.6  --  14.4  INR  --   --   --   --   --   --   --  1.1  --  1.1  HEPARINUNFRC  --   --   --   --   --   --   --   --  >1.10* >1.10*  CREATININE 1.22  --   --   --   --   --  1.30*  --   --  1.21  TROPONINIHS 12 82* 430* 749*  --   --   --   --   --   --    < > = values in this interval not displayed.    Estimated Creatinine Clearance: 98 mL/min (by C-G formula based on SCr of 1.21 mg/dL).   Medical History: Past Medical History:  Diagnosis Date   A-fib [facility])    DCCV 2009, 2012, 2014, Nov 2016   Anxiety    Arthritis    Knees, wrist, ankles   Cancer (HCC) 07/2019   Kidney   Colon cancer (HCC)    Diabetes mellitus without complication (HCC)    Dysrhythmia 1996   A- Fib   GERD (gastroesophageal reflux disease)    History of kidney stones    HX: anticoagulation     very remotely and briefly on COumadin aound one of his CV, 2014 on Eliquis had hematuria with renal calculi and stopped   Hyperlipidemia    patient denies this   Hypertension    IBS (irritable bowel syndrome)    Normal cardiac stress test    Normal nuclear stress test , 2001   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 Jimmey Ralph.   S/P ablation of atrial fibrillation 02/21/2023   With Dr. Jimmey Ralph    Medications:  Medications Prior to  Admission  Medication Sig Dispense Refill Last Dose/Taking   acetaminophen (TYLENOL) 500 MG tablet Take 1,500 mg by mouth daily as needed for moderate pain (pain score 4-6), fever or headache.   Past Week   amLODipine (NORVASC) 5 MG tablet Take 1 tablet (5 mg total) by mouth daily. (Patient taking differently: Take 5 mg by mouth at bedtime.) 90 tablet 2 03/24/2023 Bedtime   apixaban (ELIQUIS) 5 MG TABS tablet Take 1 tablet (5 mg total) by mouth 2 (two) times daily. 60 tablet 3 03/25/2023 at  7:00 AM   atorvastatin (LIPITOR) 20 MG tablet Take 1 tablet (20 mg total) by mouth at bedtime. (Patient taking differently: Take 20 mg by mouth daily.) 90 tablet 1 03/25/2023 Morning   Cholecalciferol (VITAMIN D-3 PO) Take 1 capsule by mouth daily.   03/25/2023 Morning   DULoxetine (CYMBALTA) 60 MG capsule Take 60 mg by mouth in the morning.   03/25/2023 Morning   MAGNESIUM PO Take 1 tablet by mouth at bedtime.   03/24/2023 Bedtime   metoprolol tartrate (LOPRESSOR) 100 MG tablet TAKE ONE TABLET BY MOUTH TWICE DAILY 180 tablet 1 03/25/2023 Morning   Multiple Vitamins-Minerals (ZINC PO) Take 1 tablet by mouth daily.   03/25/2023 Morning   OVER THE COUNTER MEDICATION Take 1 capsule by mouth 2 (two) times daily. OTC supplement "Glucocil"   03/25/2023 Morning    Assessment: 62 YOM s/p TEE with noted left atrial clot w/ clot coming out of the left atrial appendage closure device/watchman  device. This is considered an apixaban failure. Last dose of apixaban given 3/5 at 08:21. He was on a hep gtt prior to apixaban dose given this morning. Restarted at last known rate He has a history of using warfarin from February 2023. Last known dose was 10 mg on Monday, Wednesday, Friday, and Saturday. 7.5 mg on Sunday, Tuesday, and Thursday. This regimen resulted in an INR ~2.4  Heparin drip 2300 uts/hr with aptt 50sec (Heparin level > 1.1- as expected after apixaban) no bleeding noted   3/6 AM update:  aPTT therapeutic   Goal of Therapy:  INR 2-3 Heparin level 0.3-0.7 units/ml aPTT 66-102 seconds Monitor platelets by anticoagulation protocol: Yes   Plan:  Cont heparin 2500 units/hr Heparin level and aPTT in 8 hours Heparin level, aPTT, INR, and CBC daily. May DC aPTT once correlates with heparin level  Abran Duke, PharmD, BCPS Clinical Pharmacist Phone: [phone]

## 2023-03-28 NOTE — Progress Notes (Signed)
 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  Allergies  Allergen Reactions   Ace Inhibitors Cough   Cozaar [Losartan Potassium] Other (See Comments)    Hyperkalemia    Xarelto [Rivaroxaban] Other (See Comments)    Heart out of rhythm    Zestril [Lisinopril] Cough   Quinine Derivatives Rash    Patient Measurements: Height: 6\' 7"  (200.7 cm) Weight: (!) 140.3 kg (309 lb 4.9 oz) IBW/kg (Calculated) : 93.7 Heparin Dosing Weight: 124.1 kg  Vital Signs: Temp: 98.1 F (36.7 C) (03/06 1917) Temp Source: Oral (03/06 1917) BP: 126/94 (03/06 1917) Pulse Rate: 76 (03/06 1917)  Labs: Recent Labs    03/25/23 2112 03/26/23 0429 03/26/23 1051 03/27/23 0215 03/27/23 1405 03/27/23 2058 03/28/23 0230 03/28/23 1144 03/28/23 1844  HGB  --    < > 12.6* 13.0  --   --  12.7*  --   --   HCT  --   --  37.5* 38.7*  --   --  37.9*  --   --   PLT  --   --  278 277  --   --  277  --   --   APTT  --    < > 52*  --   --  50* 66* 62* 80*  LABPROT  --   --   --   --  14.6  --  14.4  --   --   INR  --   --   --   --  1.1  --  1.1  --   --   HEPARINUNFRC  --   --   --   --   --  >1.10* >1.10*  --   --   CREATININE  --   --   --  1.30*  --   --  1.21  --   --   TROPONINIHS 749*  --   --   --   --   --   --   --   --    < > = values in this interval not displayed.    Estimated Creatinine Clearance: 98 mL/min (by C-G formula based on SCr of 1.21 mg/dL).   Medical History: Past Medical History:  Diagnosis Date   A-fib [facility])    DCCV 2009, 2012, 2014, Nov 2016   Anxiety    Arthritis    Knees, wrist, ankles   Cancer (HCC) 07/2019   Kidney   Colon cancer (HCC)    Diabetes mellitus without complication (HCC)    Dysrhythmia 1996   A- Fib   GERD (gastroesophageal reflux disease)    History of kidney stones    HX: anticoagulation    very remotely and briefly on COumadin aound one of  his CV, 2014 on Eliquis had hematuria with renal calculi and stopped   Hyperlipidemia    patient denies this   Hypertension    IBS (irritable bowel syndrome)    Normal cardiac stress test    Normal nuclear stress test , 2001   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 Jimmey Ralph.   S/P ablation of atrial fibrillation 02/21/2023   With Dr. Jimmey Ralph    Medications:  Medications Prior to Admission  Medication Sig Dispense Refill Last Dose/Taking   acetaminophen (TYLENOL) 500 MG tablet Take 1,500 mg  by mouth daily as needed for moderate pain (pain score 4-6), fever or headache.   Past Week   amLODipine (NORVASC) 5 MG tablet Take 1 tablet (5 mg total) by mouth daily. (Patient taking differently: Take 5 mg by mouth at bedtime.) 90 tablet 2 03/24/2023 Bedtime   apixaban (ELIQUIS) 5 MG TABS tablet Take 1 tablet (5 mg total) by mouth 2 (two) times daily. 60 tablet 3 03/25/2023 at  7:00 AM   atorvastatin (LIPITOR) 20 MG tablet Take 1 tablet (20 mg total) by mouth at bedtime. (Patient taking differently: Take 20 mg by mouth daily.) 90 tablet 1 03/25/2023 Morning   Cholecalciferol (VITAMIN D-3 PO) Take 1 capsule by mouth daily.   03/25/2023 Morning   DULoxetine (CYMBALTA) 60 MG capsule Take 60 mg by mouth in the morning.   03/25/2023 Morning   MAGNESIUM PO Take 1 tablet by mouth at bedtime.   03/24/2023 Bedtime   metoprolol tartrate (LOPRESSOR) 100 MG tablet TAKE ONE TABLET BY MOUTH TWICE DAILY 180 tablet 1 03/25/2023 Morning   Multiple Vitamins-Minerals (ZINC PO) Take 1 tablet by mouth daily.   03/25/2023 Morning   OVER THE COUNTER MEDICATION Take 1 capsule by mouth 2 (two) times daily. OTC supplement "Glucocil"   03/25/2023 Morning    Assessment: 62 YOM s/p TEE with noted left atrial clot w/ clot coming out of the left atrial appendage closure device/watchman device. This is considered an apixaban failure. Last  dose of apixaban given 3/5 at 08:21. He was on a hep gtt prior to apixaban dose given this morning. Restarted at last known rate He has a history of using warfarin from February 2023. Last known dose was 10 mg on Monday, Wednesday, Friday, and Saturday. 7.5 mg on Sunday, Tuesday, and Thursday. This regimen resulted in an INR ~2.4  Heparin drip 2600 uts/hr with aptt therapeutic (Heparin level > 1.1- as expected after apixaban) no bleeding noted   Goal of Therapy:  INR 2-3 Heparin level 0.3-0.7 units/ml aPTT 66-102 seconds Monitor platelets by anticoagulation protocol: Yes   Plan:  Continue IV heparin at 2600 units/hr. Heparin level, aPTT, INR, and CBC daily. May DC aPTT once correlates with heparin level  Reece Leader, Colon Flattery, [facility] Clinical Pharmacist  03/28/2023 8:10 PM   [facility] pharmacy phone numbers are listed on [email or web address]

## 2023-03-28 NOTE — Progress Notes (Addendum)
 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  Allergies  Allergen Reactions   Ace Inhibitors Cough   Cozaar [Losartan Potassium] Other (See Comments)    Hyperkalemia    Xarelto [Rivaroxaban] Other (See Comments)    Heart out of rhythm    Zestril [Lisinopril] Cough   Quinine Derivatives Rash    Patient Measurements: Height: 6\' 7"  (200.7 cm) Weight: (!) 140.3 kg (309 lb 4.9 oz) IBW/kg (Calculated) : 93.7 Heparin Dosing Weight: 124.1 kg  Vital Signs: Temp: 98.1 F (36.7 C) (03/06 0517) Temp Source: Oral (03/06 0517) BP: 133/85 (03/06 0517) Pulse Rate: 90 (03/05 1950)  Labs: Recent Labs    03/25/23 0941 03/25/23 1129 03/25/23 1748 03/25/23 2112 03/26/23 0429 03/26/23 1051 03/27/23 0215 03/27/23 1405 03/27/23 2058 03/28/23 0230  HGB 13.6  --   --   --   --  12.6* 13.0  --   --  12.7*  HCT 40.7  --   --   --   --  37.5* 38.7*  --   --  37.9*  PLT 277  --   --   --   --  278 277  --   --  277  APTT  --   --   --   --    < > 52*  --   --  50* 66*  LABPROT  --   --   --   --   --   --   --  14.6  --  14.4  INR  --   --   --   --   --   --   --  1.1  --  1.1  HEPARINUNFRC  --   --   --   --   --   --   --   --  >1.10* >1.10*  CREATININE 1.22  --   --   --   --   --  1.30*  --   --  1.21  TROPONINIHS 12 82* 430* 749*  --   --   --   --   --   --    < > = values in this interval not displayed.    Estimated Creatinine Clearance: 98 mL/min (by C-G formula based on SCr of 1.21 mg/dL).   Medical History: Past Medical History:  Diagnosis Date   A-fib [facility])    DCCV 2009, 2012, 2014, Nov 2016   Anxiety    Arthritis    Knees, wrist, ankles   Cancer (HCC) 07/2019   Kidney   Colon cancer (HCC)    Diabetes mellitus without complication (HCC)    Dysrhythmia 1996   A- Fib   GERD (gastroesophageal reflux disease)    History of kidney stones    HX:  anticoagulation    very remotely and briefly on COumadin aound one of his CV, 2014 on Eliquis had hematuria with renal calculi and stopped   Hyperlipidemia    patient denies this   Hypertension    IBS (irritable bowel syndrome)    Normal cardiac stress test    Normal nuclear stress test , 2001   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 Jimmey Ralph.   S/P ablation of atrial fibrillation 02/21/2023   With Dr. Jimmey Ralph    Medications:  Medications Prior to  Admission  Medication Sig Dispense Refill Last Dose/Taking   acetaminophen (TYLENOL) 500 MG tablet Take 1,500 mg by mouth daily as needed for moderate pain (pain score 4-6), fever or headache.   Past Week   amLODipine (NORVASC) 5 MG tablet Take 1 tablet (5 mg total) by mouth daily. (Patient taking differently: Take 5 mg by mouth at bedtime.) 90 tablet 2 03/24/2023 Bedtime   apixaban (ELIQUIS) 5 MG TABS tablet Take 1 tablet (5 mg total) by mouth 2 (two) times daily. 60 tablet 3 03/25/2023 at  7:00 AM   atorvastatin (LIPITOR) 20 MG tablet Take 1 tablet (20 mg total) by mouth at bedtime. (Patient taking differently: Take 20 mg by mouth daily.) 90 tablet 1 03/25/2023 Morning   Cholecalciferol (VITAMIN D-3 PO) Take 1 capsule by mouth daily.   03/25/2023 Morning   DULoxetine (CYMBALTA) 60 MG capsule Take 60 mg by mouth in the morning.   03/25/2023 Morning   MAGNESIUM PO Take 1 tablet by mouth at bedtime.   03/24/2023 Bedtime   metoprolol tartrate (LOPRESSOR) 100 MG tablet TAKE ONE TABLET BY MOUTH TWICE DAILY 180 tablet 1 03/25/2023 Morning   Multiple Vitamins-Minerals (ZINC PO) Take 1 tablet by mouth daily.   03/25/2023 Morning   OVER THE COUNTER MEDICATION Take 1 capsule by mouth 2 (two) times daily. OTC supplement "Glucocil"   03/25/2023 Morning    Assessment: 62 YOM s/p TEE with noted left atrial clot w/ clot coming out of the left atrial appendage closure  device/watchman device. This is considered an apixaban failure. Last dose of apixaban given 3/5 at 08:21. He was on a hep gtt prior to apixaban dose given this morning. Restarted at last known rate He has a history of using warfarin from February 2023. Last known dose was 10 mg on Monday, Wednesday, Friday, and Saturday. 7.5 mg on Sunday, Tuesday, and Thursday. This regimen resulted in an INR ~2.4  Heparin drip 2500 uts/hr with aptt therapeutic (Heparin level > 1.1- as expected after apixaban) no bleeding noted   3/6 AM update:  INR 1.1 subtherapeutic  3/6 PM Update: aPTT 62 secs subtherapeutic  Goal of Therapy:  INR 2-3 Heparin level 0.3-0.7 units/ml aPTT 66-102 seconds Monitor platelets by anticoagulation protocol: Yes   Plan:  Increase heparin to 2600 units/hr Heparin aPTT in 8 hours Warfarin 10 mg po x 1 tonight  Heparin level, aPTT, INR, and CBC daily. May DC aPTT once correlates with heparin level  Jeanella Cara, PharmD, [facility] Clinical Pharmacist Please see AMION for all Pharmacists' Contact Phone Numbers 03/28/2023, 7:05 AM

## 2023-03-28 NOTE — Plan of Care (Signed)
  Problem: Education: Goal: Knowledge of General Education information will improve Description: Including pain rating scale, medication(s)/side effects and non-pharmacologic comfort measures Outcome: Progressing   Problem: Health Behavior/Discharge Planning: Goal: Ability to manage health-related needs will improve Outcome: Progressing   Problem: Clinical Measurements: Goal: Will remain free from infection Outcome: Progressing Goal: Diagnostic test results will improve Outcome: Progressing Goal: Respiratory complications will improve Outcome: Progressing Goal: Cardiovascular complication will be avoided Outcome: Progressing   Problem: Activity: Goal: Risk for activity intolerance will decrease Outcome: Progressing   Problem: Nutrition: Goal: Adequate nutrition will be maintained Outcome: Progressing   Problem: Coping: Goal: Level of anxiety will decrease Outcome: Progressing   Problem: Pain Managment: Goal: General experience of comfort will improve and/or be controlled Outcome: Progressing   Problem: Safety: Goal: Ability to remain free from injury will improve Outcome: Progressing

## 2023-03-28 NOTE — Progress Notes (Signed)
  Progress Note   Date: 03/27/2023  Patient Name: Bradley Rodriguez        MRN#: [id number]   Clarification of diagnosis:   Obesity class I: Based on BMI of 34.8

## 2023-03-28 NOTE — Progress Notes (Signed)
  Progress Note   Date: 03/27/2023  Patient Name: Bradley Rodriguez        MRN#: [id number]  Review the patient's clinical findings supports the diagnosis of:   CKD Stage 2      : Secondary to history of L nephrectomy in 2021. Important to continue monitoring but otherwise not clinically significant at this time.

## 2023-03-28 NOTE — Plan of Care (Signed)
  Problem: Education: Goal: Knowledge of General Education information will improve Description: Including pain rating scale, medication(s)/side effects and non-pharmacologic comfort measures Outcome: Progressing   Problem: Health Behavior/Discharge Planning: Goal: Ability to manage health-related needs will improve Outcome: Progressing   Problem: Clinical Measurements: Goal: Will remain free from infection Outcome: Progressing Goal: Diagnostic test results will improve Outcome: Progressing   Problem: Activity: Goal: Risk for activity intolerance will decrease Outcome: Progressing   Problem: Nutrition: Goal: Adequate nutrition will be maintained Outcome: Progressing   Problem: Coping: Goal: Level of anxiety will decrease Outcome: Progressing   Problem: Elimination: Goal: Will not experience complications related to urinary retention Outcome: Progressing

## 2023-03-28 NOTE — Plan of Care (Signed)

## 2023-03-29 ENCOUNTER — Ambulatory Visit: Payer: Medicare HMO

## 2023-03-29 ENCOUNTER — Ambulatory Visit (HOSPITAL_COMMUNITY): Payer: Medicare HMO | Admitting: Internal Medicine

## 2023-03-29 DIAGNOSIS — I214 Non-ST elevation (NSTEMI) myocardial infarction: Secondary | ICD-10-CM | POA: Diagnosis not present

## 2023-03-29 DIAGNOSIS — I4891 Unspecified atrial fibrillation: Secondary | ICD-10-CM | POA: Diagnosis not present

## 2023-03-29 LAB — BASIC METABOLIC PANEL
Anion gap: 10 (ref 5–15)
BUN: 19 mg/dL (ref 8–23)
CO2: 21 mmol/L — ABNORMAL LOW (ref 22–32)
Calcium: 9.2 mg/dL (ref 8.9–10.3)
Chloride: 106 mmol/L (ref 98–111)
Creatinine, Ser: 1.25 mg/dL — ABNORMAL HIGH (ref 0.61–1.24)
GFR, Estimated: >60 mL/min (ref >60)
Glucose, Bld: 162 mg/dL — ABNORMAL HIGH (ref 70–99)
Potassium: 4.3 mmol/L (ref 3.5–5.1)
Sodium: 137 mmol/L (ref 135–145)

## 2023-03-29 LAB — CBC
HCT: 40.2 % (ref 39.0–52.0)
Hemoglobin: 13.5 g/dL (ref 13.0–17.0)
MCH: 29.5 pg (ref 26.0–34.0)
MCHC: 33.6 g/dL (ref 30.0–36.0)
MCV: 87.8 fL (ref 80.0–100.0)
Platelets: 302 10*3/uL (ref 150–400)
RBC: 4.58 MIL/uL (ref 4.22–5.81)
RDW: 13.3 % (ref 11.5–15.5)
WBC: 5.5 10*3/uL (ref 4.0–10.5)
nRBC: 0 % (ref 0.0–0.2)

## 2023-03-29 LAB — APTT: aPTT: 78 s — ABNORMAL HIGH (ref 24–36)

## 2023-03-29 LAB — PROTIME-INR
INR: 1.1 (ref 0.8–1.2)
Prothrombin Time: 14.5 s (ref 11.4–15.2)

## 2023-03-29 LAB — ANTITHROMBIN III: AntiThromb III Func: 85 % (ref 75–120)

## 2023-03-29 LAB — HEPARIN LEVEL (UNFRACTIONATED): Heparin Unfractionated: 1.05 [IU]/mL — ABNORMAL HIGH (ref 0.30–0.70)

## 2023-03-29 MED ORDER — WARFARIN SODIUM 7.5 MG PO TABS
15.0000 mg | ORAL_TABLET | Freq: Once | ORAL | Status: AC
  Administered 2023-03-29: 15 mg via ORAL
  Filled 2023-03-29: qty 2

## 2023-03-29 MED ORDER — METOPROLOL SUCCINATE ER 100 MG PO TB24
100.0000 mg | ORAL_TABLET | Freq: Two times a day (BID) | ORAL | Status: DC
  Administered 2023-03-29 – 2023-04-01 (×6): 100 mg via ORAL
  Filled 2023-03-29 (×6): qty 1

## 2023-03-29 NOTE — Care Management Important Message (Signed)
 Important Message  Patient Details  Name: Bradley Rodriguez MRN: [id number] Date of Birth: October 28, 1960   Important Message Given:  Yes - Medicare IM     Dorena Bodo 03/29/2023, 2:38 PM

## 2023-03-29 NOTE — Progress Notes (Addendum)
 Rounding Note    Patient Name: Bradley Rodriguez Date of Encounter: 03/29/2023  [facility] Cardiologist: Dina Rich, MD   Subjective   No ongoing complaints  Inpatient Medications    Scheduled Meds:  aspirin  81 mg Oral Daily   atorvastatin  20 mg Oral QHS   diltiazem  60 mg Oral Q12H   DULoxetine  60 mg Oral q AM   metoprolol tartrate  100 mg Oral BID   sodium chloride flush  3 mL Intravenous Q12H   Warfarin - Pharmacist Dosing Inpatient   Does not apply q1600   Continuous Infusions:  heparin 2,600 Units/hr (03/28/23 2325)   PRN Meds: acetaminophen, nitroGLYCERIN, sodium chloride flush   Vital Signs    Vitals:   03/28/23 2117 03/28/23 2320 03/29/23 0408 03/29/23 0700  BP: (!) 124/96 (!) 132/94 115/79 (!) 116/93  Pulse:  82 82 88  Resp: 19 15 15 15   Temp:  98 F (36.7 C) 98.3 F (36.8 C) 97.8 F (36.6 C)  TempSrc:  Oral Oral Oral  SpO2:  95% 97% 93%  Weight:      Height:        Intake/Output Summary (Last 24 hours) at 03/29/2023 0847 Last data filed at 03/29/2023 0400 Gross per 24 hour  Intake 923.9 ml  Output 500 ml  Net 423.9 ml      03/26/2023   10:12 AM 03/25/2023    9:28 AM 03/04/2023    3:23 PM  Last 3 Weights  Weight (lbs) 309 lb 4.9 oz 305 lb 317 lb 3.2 oz  Weight (kg) 140.3 kg 138.347 kg 143.881 kg      Telemetry    AFib 80-90's mostly > 110s at times - Personally Reviewed  ECG    No new EKGs - Personally Reviewed  Physical Exam    no changes to his exam today GEN: No acute distress.   Neck: No JVD Cardiac: irreg-irreg, no murmurs, rubs, or gallops.  Respiratory: CTA b/l. GI: Soft, nontender, non-distended  MS: No edema; No deformity. Neuro:  Nonfocal  Psych: Normal affect   Labs    High Sensitivity Troponin:   Recent Labs  Lab 03/25/23 0941 03/25/23 1129 03/25/23 1748 03/25/23 2112  TROPONINIHS 12 82* 430* 749*     Chemistry Recent Labs  Lab 03/27/23 0215 03/28/23 0230 03/29/23 0236  NA 139 136 137   K 4.3 4.0 4.3  CL 102 105 106  CO2 25 23 21*  GLUCOSE 152* 165* 162*  BUN 23 21 19   CREATININE 1.30* 1.21 1.25*  CALCIUM 9.1 8.7* 9.2  GFRNONAA >60 >60 >60  ANIONGAP 12 8 10     Lipids No results for input(s): "CHOL", "TRIG", "HDL", "LABVLDL", "LDLCALC", "CHOLHDL" in the last 168 hours.  Hematology Recent Labs  Lab 03/27/23 0215 03/28/23 0230 03/29/23 0236  WBC 5.9 5.2 5.5  RBC 4.32 4.24 4.58  HGB 13.0 12.7* 13.5  HCT 38.7* 37.9* 40.2  MCV 89.6 89.4 87.8  MCH 30.1 30.0 29.5  MCHC 33.6 33.5 33.6  RDW 13.4 13.3 13.3  PLT 277 277 302   Thyroid No results for input(s): "TSH", "FREET4" in the last 168 hours.  BNPNo results for input(s): "BNP", "PROBNP" in the last 168 hours.  DDimer No results for input(s): "DDIMER" in the last 168 hours.   Radiology      Cardiac Studies   TEE 03/27/23 Mildly depressed ejection fraction, mild to moderate mitral regurgitation, left atrial clot with clot coming out of  the left atrial appendage closure device/Watchman device. Cardioversion not performed. Full report to follow. Further management per primary team.   03/25/23:  LHC   Prox RCA lesion is 20% stenosed.   2nd Mrg lesion is 20% stenosed.   Dist LAD lesion is 20% stenosed.   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 Successful PVI 2. Successful ablation/isolation of the posterior wall 3. Intracardiac echo reveals normal LV size and function, trivial pericardial effusion, 4 distinct pulmonary veins. 4. Successful implantation of a 31mm WATCHMAN FLX PRO left atrial appendage occlusive device    5. No early apparent complications.   Post Implant Anticoagulation Strategy: Continue Eliquis 5mg  twice daily for 3 months then transition to dual anti-platelet therapy.      02/21/23: TEE  1. TEE guided LAA closure. 31 mm Watchman FLX placed with 25%  compression. No device leak. Small iatrogenic ASD with L to R shunting  after the  procedure. Trivial pericardial effusion before and after the  procedure. 3D echo confirmed device size and  correct placement. No immediate complications.   2. Left ventricular ejection fraction, by estimation, is 50 to 55%. The  left ventricle has low normal function.   3. Right ventricular systolic function is mildly reduced. The right  ventricular size is mildly enlarged.   4. Left atrial size was mildly dilated. No left atrial/left atrial  appendage thrombus was detected.   5. The mitral valve is grossly normal. Mild to moderate mitral valve  regurgitation. No evidence of mitral stenosis.   6. The aortic valve is tricuspid. Aortic valve regurgitation is not  visualized. No aortic stenosis is present.   7. There is mild (Grade II) layered plaque involving the descending  aorta.      02/01/23: Cardiac CT IMPRESSION: 1. The left atrial appendage is a large chicken wing morphology without thrombus.   2. A 31 mm Watchman FLX device is recommended based on the above landing zone measurements.   3. A mid/mid IAS puncture site is recommended.   4. Optimal deployment angle: RAO 10 CRA 15   5. Small PFO.   6. CAC score of 253, which is 73rd percentile for age-, sex-, and race-matched controls.   7. Mildly dilated aortic root up to 43 mm.  Patient Profile     63 y.o. male HTN, HLD, DM, colon Ca/GIB, AFib admitted with symptomatic Afib, CP  Recently: AFib ablation with watchman implant 02/21/23   Assessment & Plan    Persistent Afib CHA2DS2Vasc is 2, on eliquis, appropriately dosed Eliquis held > heparin  TEE yesterday noted thrombus (DCCV deferred) on heparin gtt concern perhaps that given his body habitus, Eliquis failed, concerns with the other similar agents may be as ineffective planned for warfarin  Revisited options  heparin > therapeutic INR staying in patient or lovenox to therapeutic INR going home  The patient feels most comfortable staying to complete his  brdige to therapeutic INR  Dilt added 3/5 >> HRs much better 80's-100s   INR today 1.1 Coag/heme labs drawn Hx of colon/renal ca   Dizzy More so off balance when up the morning of his admission This is not an ongoing complaint Dr. Jimmey Ralph 03/28/23 d/w patient concerns his presenting symptom may have been a small stroke Symptoms remains completely resolved +/- TIA Discussed neuro consult and formal evaluation but the patient declined with resolved symptoms    NSTEMI/chest discomfort (?) Denies this as a chest pain Similar symptom for 2-3  days post procedure HS Trop behavior, unusual at  12 >> 82 > 430 > 749 Vague nondescript complaint denies ongoing here HS Trops c/w NTEMI LHC 3/4 without significant CAD     For questions or updates, please contact [facility] Please consult [email or web address] for contact info under        Signed, Sheilah Pigeon, PA-C  03/29/2023, 8:47 AM    I have seen, examined the patient, and reviewed the above assessment and plan.    Interval: No acute overnight events. Patient reports feeling relatively well. Remains on heparin gtt to therapeutic warfarin.   GEN: No acute distress.   Cardiac: Normal rate, irregular.  Resp: Normal work of breathing.  Ext: No edema. Psych: Normal affect   Assessment and plan:  #. Persistent atrial fibrillation: Has history of longstanding persistent atrial fibrillation and has failed antiarrhythmic drug therapy previously.  Maintained sinus rhythm for approximately 24 days after catheter ablation.  Unfortunately now with early recurrence. Rates have improved since starting diltiazem. Remain reasonably well controlled at rest.  - Will transition metoprolol tartrate 100mg  BID to metoprolol succinate 100mg  BID.  - Continue diltiazem 60mg  BID. Can uptitrate as needed and transition to long acting on discharge.    #. S/p Watchman device: Underwent TEE today with well seated Watchman device with no leak; however,  device related thrombus was seen on surface of Watchman device.  #. Device related thrombus:  -No plans for cardioversion or anti-arrhythmics due to thrombus. Will rate control with metoprolol and diltiazem as above.  -Continue IV heparin with bridging to therapeutic warfarin.He was already planned for therapeutic anti-coagulation for at least 90 days after ablation. INR 1.1 today. -Repeat TEE in 4-6 weeks. -Send off hypercoagulable panel.  -Surveillance CT scan of abdomen and pelvis given history of renal cell carcinoma and colon cancer. Recurrence would be explanation for hypercoagulable state.  Nobie Putnam, MD 03/29/2023 6:32 PM

## 2023-03-29 NOTE — Progress Notes (Signed)
 Mobility Specialist Progress Note:   03/29/23 1345  Mobility  Activity Ambulated with assistance in hallway  Level of Assistance Standby assist, set-up cues, supervision of patient - no hands on  Assistive Device None  Distance Ambulated (ft) 500 ft  Activity Response Tolerated well  Mobility Referral Yes  Mobility visit 1 Mobility  Mobility Specialist Start Time (ACUTE ONLY) 0930  Mobility Specialist Stop Time (ACUTE ONLY) 1040  Mobility Specialist Time Calculation (min) (ACUTE ONLY) 70 min   Received pt in bed having no complaints and agreeable to mobility. Pt HR spiked to 162 towards EOB took standing rest break to get it lower. Pt had no c/o during ambulation. Returned to room w/o fault. Left in bed w/ call bell in reach and all needs met.  Pre Mobility HR 109 During Mobility HR 120-162 Post Mobility HR 120  Thompson Grayer Mobility Specialist  Please contact vis Secure Chat or  Rehab Office [phone]

## 2023-03-29 NOTE — Progress Notes (Signed)
 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  Allergies  Allergen Reactions   Ace Inhibitors Cough   Cozaar [Losartan Potassium] Other (See Comments)    Hyperkalemia    Xarelto [Rivaroxaban] Other (See Comments)    Heart out of rhythm    Zestril [Lisinopril] Cough   Quinine Derivatives Rash    Patient Measurements: Height: 6\' 7"  (200.7 cm) Weight: (!) 140.3 kg (309 lb 4.9 oz) IBW/kg (Calculated) : 93.7 Heparin Dosing Weight: 124.1 kg  Vital Signs: Temp: 97.8 F (36.6 C) (03/07 0700) Temp Source: Oral (03/07 0700) BP: 116/93 (03/07 0700) Pulse Rate: 88 (03/07 0700)  Labs: Recent Labs    03/27/23 0215 03/27/23 0215 03/27/23 1405 03/27/23 2058 03/28/23 0230 03/28/23 1144 03/28/23 1844 03/29/23 0236  HGB 13.0  --   --   --  12.7*  --   --  13.5  HCT 38.7*  --   --   --  37.9*  --   --  40.2  PLT 277  --   --   --  277  --   --  302  APTT  --    < >  --  50* 66* 62* 80* 78*  LABPROT  --   --  14.6  --  14.4  --   --  14.5  INR  --   --  1.1  --  1.1  --   --  1.1  HEPARINUNFRC  --   --   --  >1.10* >1.10*  --   --  1.05*  CREATININE 1.30*  --   --   --  1.21  --   --  1.25*   < > = values in this interval not displayed.    Estimated Creatinine Clearance: 94.8 mL/min (A) (by C-G formula based on SCr of 1.25 mg/dL (H)).   Medical History: Past Medical History:  Diagnosis Date   A-fib [facility])    DCCV 2009, 2012, 2014, Nov 2016   Anxiety    Arthritis    Knees, wrist, ankles   Cancer (HCC) 07/2019   Kidney   Colon cancer (HCC)    Diabetes mellitus without complication (HCC)    Dysrhythmia 1996   A- Fib   GERD (gastroesophageal reflux disease)    History of kidney stones    HX: anticoagulation    very remotely and briefly on COumadin aound one of his CV, 2014 on Eliquis had hematuria with renal calculi and stopped   Hyperlipidemia    patient denies  this   Hypertension    IBS (irritable bowel syndrome)    Normal cardiac stress test    Normal nuclear stress test , 2001   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 Jimmey Ralph.   S/P ablation of atrial fibrillation 02/21/2023   With Dr. Jimmey Ralph    Medications:  Medications Prior to Admission  Medication Sig Dispense Refill Last Dose/Taking   acetaminophen (TYLENOL) 500 MG tablet Take 1,500 mg by mouth daily as needed for moderate pain (pain score 4-6), fever or headache.   Past Week   amLODipine (NORVASC) 5 MG tablet Take 1 tablet (5 mg total) by mouth daily. (Patient taking differently: Take 5 mg by mouth at bedtime.) 90 tablet 2 03/24/2023 Bedtime   apixaban (ELIQUIS) 5 MG TABS tablet Take 1 tablet (  5 mg total) by mouth 2 (two) times daily. 60 tablet 3 03/25/2023 at  7:00 AM   atorvastatin (LIPITOR) 20 MG tablet Take 1 tablet (20 mg total) by mouth at bedtime. (Patient taking differently: Take 20 mg by mouth daily.) 90 tablet 1 03/25/2023 Morning   Cholecalciferol (VITAMIN D-3 PO) Take 1 capsule by mouth daily.   03/25/2023 Morning   DULoxetine (CYMBALTA) 60 MG capsule Take 60 mg by mouth in the morning.   03/25/2023 Morning   MAGNESIUM PO Take 1 tablet by mouth at bedtime.   03/24/2023 Bedtime   metoprolol tartrate (LOPRESSOR) 100 MG tablet TAKE ONE TABLET BY MOUTH TWICE DAILY 180 tablet 1 03/25/2023 Morning   Multiple Vitamins-Minerals (ZINC PO) Take 1 tablet by mouth daily.   03/25/2023 Morning   OVER THE COUNTER MEDICATION Take 1 capsule by mouth 2 (two) times daily. OTC supplement "Glucocil"   03/25/2023 Morning    Assessment: 62 YOM s/p TEE with noted left atrial clot w/ clot coming out of the left atrial appendage closure device/watchman device. This is considered an apixaban failure. Last dose of apixaban given 3/5 at 08:21. He was on a hep gtt prior to apixaban dose given this morning.  Restarted at last known rate He has a history of using warfarin from February 2023. Last known dose was 10 mg on Monday, Wednesday, Friday, and Saturday. 7.5 mg on Sunday, Tuesday, and Thursday. This regimen resulted in an INR ~2.4  Heparin drip 2600 uts/hr with aptt 78sec  therapeutic (Heparin level > 1.1- as expected after apixaban) no bleeding noted  INR 1.1 remains low after 2 doses warfarin 10mg  - will increase today   Goal of Therapy:  INR 2-3 Heparin level 0.3-0.7 units/ml aPTT 66-102 seconds Monitor platelets by anticoagulation protocol: Yes   Plan:  Continue IV heparin at 2600 units/hr. Warfarin 15mg  x1  Heparin level, aPTT, INR, and CBC daily.  May DC aPTT once correlates with heparin level   Leota Sauers Pharm.D. CPP, BCPS Clinical Pharmacist [phone] 03/29/2023 11:31 AM    [facility] pharmacy phone numbers are listed on [email or web address]

## 2023-03-29 NOTE — TOC Progression Note (Signed)
 Transition of Care [facility]) - Progression Note    Patient Details  Name: Bradley Rodriguez MRN: [id number] Date of Birth: 1960-11-12  [facility] of [facility] CM/SW Contact  Lockie Pares, RN Phone Number: 03/29/2023, 1:52 PM  Clinical Narrative:     Patient underwent an ablation with Watchman's, out of afib currently. Likely DC in Am no needs identified.  Walking well with mobility team       Expected Discharge Plan and Services      Home self care                                         Social Determinants of Health (SDOH) Interventions SDOH Screenings   Food Insecurity: No Food Insecurity (03/25/2023)  Housing: Low Risk  (03/25/2023)  Transportation Needs: No Transportation Needs (03/25/2023)  Utilities: Not At Risk (03/25/2023)  Alcohol Screen: Low Risk  (09/01/2020)  Depression (PHQ2-9): Low Risk  (09/01/2020)  Financial Resource Strain: Low Risk  (09/01/2020)  Physical Activity: Inactive (09/01/2020)  Social Connections: Moderately Integrated (09/01/2020)  Stress: No Stress Concern Present (09/01/2020)  Tobacco Use: Low Risk  (03/25/2023)    Readmission Risk Interventions     No data to display

## 2023-03-30 ENCOUNTER — Inpatient Hospital Stay (HOSPITAL_COMMUNITY)

## 2023-03-30 DIAGNOSIS — I4891 Unspecified atrial fibrillation: Secondary | ICD-10-CM | POA: Diagnosis not present

## 2023-03-30 LAB — CBC
HCT: 38.8 % — ABNORMAL LOW (ref 39.0–52.0)
Hemoglobin: 12.9 g/dL — ABNORMAL LOW (ref 13.0–17.0)
MCH: 30 pg (ref 26.0–34.0)
MCHC: 33.2 g/dL (ref 30.0–36.0)
MCV: 90.2 fL (ref 80.0–100.0)
Platelets: 270 10*3/uL (ref 150–400)
RBC: 4.3 MIL/uL (ref 4.22–5.81)
RDW: 13.5 % (ref 11.5–15.5)
WBC: 5.5 10*3/uL (ref 4.0–10.5)
nRBC: 0 % (ref 0.0–0.2)

## 2023-03-30 LAB — BASIC METABOLIC PANEL
Anion gap: 13 (ref 5–15)
BUN: 25 mg/dL — ABNORMAL HIGH (ref 8–23)
CO2: 19 mmol/L — ABNORMAL LOW (ref 22–32)
Calcium: 9.2 mg/dL (ref 8.9–10.3)
Chloride: 105 mmol/L (ref 98–111)
Creatinine, Ser: 1.46 mg/dL — ABNORMAL HIGH (ref 0.61–1.24)
GFR, Estimated: 53 mL/min — ABNORMAL LOW (ref >60)
Glucose, Bld: 155 mg/dL — ABNORMAL HIGH (ref 70–99)
Potassium: 4.5 mmol/L (ref 3.5–5.1)
Sodium: 137 mmol/L (ref 135–145)

## 2023-03-30 LAB — PROTIME-INR
INR: 1.2 (ref 0.8–1.2)
Prothrombin Time: 15.8 s — ABNORMAL HIGH (ref 11.4–15.2)

## 2023-03-30 LAB — CARDIOLIPIN ANTIBODIES, IGG, IGM, IGA
Anticardiolipin IgA: <9 U/mL (ref 0–11)
Anticardiolipin IgG: <9 GPL U/mL (ref 0–14)
Anticardiolipin IgM: <9 [MPL'U]/mL (ref 0–12)

## 2023-03-30 LAB — HOMOCYSTEINE: Homocysteine: 13.3 umol/L (ref 0.0–17.2)

## 2023-03-30 LAB — HEPARIN LEVEL (UNFRACTIONATED): Heparin Unfractionated: 0.55 [IU]/mL (ref 0.30–0.70)

## 2023-03-30 LAB — PROTEIN C, TOTAL: Protein C, Total: 95 % (ref 60–150)

## 2023-03-30 LAB — BETA-2-GLYCOPROTEIN I ABS, IGG/M/A
Beta-2 Glyco I IgG: 58 GPI IgG units — ABNORMAL HIGH (ref 0–20)
Beta-2-Glycoprotein I IgA: <9 GPI IgA units (ref 0–25)
Beta-2-Glycoprotein I IgM: <9 GPI IgM units (ref 0–32)

## 2023-03-30 LAB — APTT: aPTT: 77 s — ABNORMAL HIGH (ref 24–36)

## 2023-03-30 MED ORDER — IOHEXOL 12 MG/ML PO SOLN
500.0000 mL | ORAL | Status: AC
  Administered 2023-03-30 (×2): 500 mL via ORAL

## 2023-03-30 MED ORDER — IOHEXOL 9 MG/ML PO SOLN
ORAL | Status: AC
Start: 2023-03-30 — End: ?
  Filled 2023-03-30: qty 1000

## 2023-03-30 MED ORDER — IOHEXOL 350 MG/ML SOLN
60.0000 mL | Freq: Once | INTRAVENOUS | Status: AC | PRN
  Administered 2023-03-30: 60 mL via INTRAVENOUS

## 2023-03-30 MED ORDER — WARFARIN SODIUM 7.5 MG PO TABS
15.0000 mg | ORAL_TABLET | Freq: Once | ORAL | Status: AC
  Administered 2023-03-30: 15 mg via ORAL
  Filled 2023-03-30: qty 2

## 2023-03-30 NOTE — Progress Notes (Signed)
 Rounding Note    Patient Name: Bradley Rodriguez Date of Encounter: 03/30/2023  [facility] Cardiologist: Dina Rich, MD   Subjective   No ongoing complaints  Inpatient Medications    Scheduled Meds:  aspirin  81 mg Oral Daily   atorvastatin  20 mg Oral QHS   diltiazem  60 mg Oral Q12H   DULoxetine  60 mg Oral q AM   iohexol  500 mL Oral Q1H   metoprolol succinate  100 mg Oral BID   sodium chloride flush  3 mL Intravenous Q12H   Warfarin - Pharmacist Dosing Inpatient   Does not apply q1600   Continuous Infusions:  heparin 2,600 Units/hr (03/29/23 2338)   PRN Meds: acetaminophen, nitroGLYCERIN, sodium chloride flush   Vital Signs    Vitals:   03/29/23 2001 03/29/23 2338 03/30/23 0439 03/30/23 0741  BP: 135/81 117/82 113/89 131/85  Pulse: 90  70 (!) 54  Resp: 19 19 13    Temp: 98.1 F (36.7 C) 98 F (36.7 C) 98 F (36.7 C) 98.2 F (36.8 C)  TempSrc: Oral Oral Oral Oral  SpO2:   96%   Weight:      Height:        Intake/Output Summary (Last 24 hours) at 03/30/2023 0848 Last data filed at 03/30/2023 0746 Gross per 24 hour  Intake 593.76 ml  Output --  Net 593.76 ml      03/26/2023   10:12 AM 03/25/2023    9:28 AM 03/04/2023    3:23 PM  Last 3 Weights  Weight (lbs) 309 lb 4.9 oz 305 lb 317 lb 3.2 oz  Weight (kg) 140.3 kg 138.347 kg 143.881 kg      Telemetry    AFib 80-90's mostly > 110s at times - Personally Reviewed  ECG    No new EKGs - Personally Reviewed  Physical Exam    no changes to his exam today GEN: No acute distress.   Neck: No JVD Cardiac: irreg-irreg, no murmurs, rubs, or gallops.  Respiratory: CTA b/l. GI: Soft, nontender, non-distended  MS: No edema; No deformity. Neuro:  Nonfocal  Psych: Normal affect   Labs    High Sensitivity Troponin:   Recent Labs  Lab 03/25/23 0941 03/25/23 1129 03/25/23 1748 03/25/23 2112  TROPONINIHS 12 82* 430* 749*     Chemistry Recent Labs  Lab 03/28/23 0230 03/29/23 0236  03/30/23 0212  NA 136 137 137  K 4.0 4.3 4.5  CL 105 106 105  CO2 23 21* 19*  GLUCOSE 165* 162* 155*  BUN 21 19 25*  CREATININE 1.21 1.25* 1.46*  CALCIUM 8.7* 9.2 9.2  GFRNONAA >60 >60 53*  ANIONGAP 8 10 13     Lipids No results for input(s): "CHOL", "TRIG", "HDL", "LABVLDL", "LDLCALC", "CHOLHDL" in the last 168 hours.  Hematology Recent Labs  Lab 03/28/23 0230 03/29/23 0236 03/30/23 0212  WBC 5.2 5.5 5.5  RBC 4.24 4.58 4.30  HGB 12.7* 13.5 12.9*  HCT 37.9* 40.2 38.8*  MCV 89.4 87.8 90.2  MCH 30.0 29.5 30.0  MCHC 33.5 33.6 33.2  RDW 13.3 13.3 13.5  PLT 277 302 270   Thyroid No results for input(s): "TSH", "FREET4" in the last 168 hours.  BNPNo results for input(s): "BNP", "PROBNP" in the last 168 hours.  DDimer No results for input(s): "DDIMER" in the last 168 hours.   Radiology      Cardiac Studies   TEE 03/27/23 Mildly depressed ejection fraction, mild to moderate mitral regurgitation,  left atrial clot with clot coming out of the left atrial appendage closure device/Watchman device. Cardioversion not performed. Full report to follow. Further management per primary team.   03/25/23:  LHC   Prox RCA lesion is 20% stenosed.   2nd Mrg lesion is 20% stenosed.   Dist LAD lesion is 20% stenosed.   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 Successful PVI 2. Successful ablation/isolation of the posterior wall 3. Intracardiac echo reveals normal LV size and function, trivial pericardial effusion, 4 distinct pulmonary veins. 4. Successful implantation of a 31mm WATCHMAN FLX PRO left atrial appendage occlusive device    5. No early apparent complications.   Post Implant Anticoagulation Strategy: Continue Eliquis 5mg  twice daily for 3 months then transition to dual anti-platelet therapy.      02/21/23: TEE  1. TEE guided LAA closure. 31 mm Watchman FLX placed with 25%  compression. No device leak. Small iatrogenic ASD  with L to R shunting  after the procedure. Trivial pericardial effusion before and after the  procedure. 3D echo confirmed device size and  correct placement. No immediate complications.   2. Left ventricular ejection fraction, by estimation, is 50 to 55%. The  left ventricle has low normal function.   3. Right ventricular systolic function is mildly reduced. The right  ventricular size is mildly enlarged.   4. Left atrial size was mildly dilated. No left atrial/left atrial  appendage thrombus was detected.   5. The mitral valve is grossly normal. Mild to moderate mitral valve  regurgitation. No evidence of mitral stenosis.   6. The aortic valve is tricuspid. Aortic valve regurgitation is not  visualized. No aortic stenosis is present.   7. There is mild (Grade II) layered plaque involving the descending  aorta.      02/01/23: Cardiac CT IMPRESSION: 1. The left atrial appendage is a large chicken wing morphology without thrombus.   2. A 31 mm Watchman FLX device is recommended based on the above landing zone measurements.   3. A mid/mid IAS puncture site is recommended.   4. Optimal deployment angle: RAO 10 CRA 15   5. Small PFO.   6. CAC score of 253, which is 73rd percentile for age-, sex-, and race-matched controls.   7. Mildly dilated aortic root up to 43 mm.  Patient Profile     63 y.o. male HTN, HLD, DM, colon Ca/GIB, AFib admitted with symptomatic Afib, CP  Recently: AFib ablation with watchman implant 02/21/23   Assessment & Plan    Persistent Afib - unable to cardiovert at this time due to LAA thrombus - rate control for now: - continue metoprolol succinate 100mg  BID.  - Continue diltiazem 60mg  BID. Can uptitrate as needed and transition to long acting on discharge.   Atrial thrombus, s/p Watchman LAA occlusion noted on TEE (DCCV deferred) Occurred while therapeutic on eliquis Watchman is well-seated, without leak continue heparin gtt, awaiting  therapeutic INR Repeat TTE in 4-6 weeks  INR today 1.2 Coag/heme labs drawn Hx of colon/renal ca -- CT abdomen/pelvis ordered  Dizzy More so off balance when up the morning of his admission This is not an ongoing complaint Dr. Jimmey Ralph 03/28/23 d/w patient concerns his presenting symptom may have been a small stroke Symptoms remains completely resolved +/- TIA Discussed neuro consult and formal evaluation but the patient declined with resolved symptoms    NSTEMI/chest discomfort (?) Denies this as a chest pain Similar symptom for 2-3 days  post procedure HS Trop behavior, unusual at  12 >> 82 > 430 > 749 Vague nondescript complaint denies ongoing here HS Trops c/w NTSEMI LHC 3/4 without significant CAD     For questions or updates, please contact [facility] Please consult [email or web address] for contact info under        Signed, Maurice Small, MD  03/30/2023, 8:48 AM

## 2023-03-30 NOTE — Progress Notes (Signed)
 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  Allergies  Allergen Reactions   Ace Inhibitors Cough   Cozaar [Losartan Potassium] Other (See Comments)    Hyperkalemia    Xarelto [Rivaroxaban] Other (See Comments)    Heart out of rhythm    Zestril [Lisinopril] Cough   Quinine Derivatives Rash    Patient Measurements: Height: 6\' 7"  (200.7 cm) Weight: (!) 140.3 kg (309 lb 4.9 oz) IBW/kg (Calculated) : 93.7 Heparin Dosing Weight: 124.1 kg  Vital Signs: Temp: 98.1 F (36.7 C) (03/08 1546) Temp Source: Oral (03/08 1546) BP: 122/81 (03/08 1546) Pulse Rate: 81 (03/08 1546)  Labs: Recent Labs    03/28/23 0230 03/28/23 1144 03/28/23 1844 03/29/23 0236 03/30/23 0212  HGB 12.7*  --   --  13.5 12.9*  HCT 37.9*  --   --  40.2 38.8*  PLT 277  --   --  302 270  APTT 66*   < > 80* 78* 77*  LABPROT 14.4  --   --  14.5 15.8*  INR 1.1  --   --  1.1 1.2  HEPARINUNFRC >1.10*  --   --  1.05* 0.55  CREATININE 1.21  --   --  1.25* 1.46*   < > = values in this interval not displayed.    Estimated Creatinine Clearance: 81.2 mL/min (A) (by C-G formula based on SCr of 1.46 mg/dL (H)).   Medical History: Past Medical History:  Diagnosis Date   A-fib [facility])    DCCV 2009, 2012, 2014, Nov 2016   Anxiety    Arthritis    Knees, wrist, ankles   Cancer (HCC) 07/2019   Kidney   Colon cancer (HCC)    Diabetes mellitus without complication (HCC)    Dysrhythmia 1996   A- Fib   GERD (gastroesophageal reflux disease)    History of kidney stones    HX: anticoagulation    very remotely and briefly on COumadin aound one of his CV, 2014 on Eliquis had hematuria with renal calculi and stopped   Hyperlipidemia    patient denies this   Hypertension    IBS (irritable bowel syndrome)    Normal cardiac stress test    Normal nuclear stress test , 2001   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 Jimmey Ralph.   S/P ablation of atrial fibrillation 02/21/2023   With Dr. Jimmey Ralph    Medications:  Medications Prior to Admission  Medication Sig Dispense Refill Last Dose/Taking   acetaminophen (TYLENOL) 500 MG tablet Take 1,500 mg by mouth daily as needed for moderate pain (pain score 4-6), fever or headache.   Past Week   amLODipine (NORVASC) 5 MG tablet Take 1 tablet (5 mg total) by mouth daily. (Patient taking differently: Take 5 mg by mouth at bedtime.) 90 tablet 2 03/24/2023 Bedtime   apixaban (ELIQUIS) 5 MG TABS tablet Take 1 tablet (5 mg total) by mouth 2 (two) times daily. 60 tablet 3 03/25/2023 at  7:00 AM   atorvastatin (LIPITOR) 20 MG tablet Take 1 tablet (20 mg total) by mouth at bedtime. (Patient taking differently: Take 20 mg by mouth daily.) 90 tablet 1 03/25/2023 Morning   Cholecalciferol (VITAMIN D-3 PO) Take 1 capsule by mouth daily.   03/25/2023 Morning   DULoxetine (CYMBALTA) 60 MG capsule Take 60 mg by mouth  in the morning.   03/25/2023 Morning   MAGNESIUM PO Take 1 tablet by mouth at bedtime.   03/24/2023 Bedtime   metoprolol tartrate (LOPRESSOR) 100 MG tablet TAKE ONE TABLET BY MOUTH TWICE DAILY 180 tablet 1 03/25/2023 Morning   Multiple Vitamins-Minerals (ZINC PO) Take 1 tablet by mouth daily.   03/25/2023 Morning   OVER THE COUNTER MEDICATION Take 1 capsule by mouth 2 (two) times daily. OTC supplement "Glucocil"   03/25/2023 Morning    Assessment: 62 YOM s/p TEE with noted left atrial clot w/ clot coming out of the left atrial appendage closure device/watchman device. This is considered an apixaban failure. Last dose of apixaban given 3/5 at 08:21. He was on a hep gtt prior to apixaban dose given this morning. Restarted at last known rate He has a history of using warfarin from February 2023. Last known dose was 10 mg on Monday, Wednesday, Friday, and Saturday. 7.5 mg on  Sunday, Tuesday, and Thursday. This regimen resulted in an INR ~2.4  Heparin drip 2600 uts/hr with aptt 77sec  therapeutic (Heparin level 0.55- starting to correlate with aptt - will check one more day to ensure alpixaban clearance) no bleeding noted  INR 1.2 small increase after warfarin 15mg  x1 will repeat for one more day   Goal of Therapy:  INR 2-3 Heparin level 0.3-0.7 units/ml aPTT 66-102 seconds Monitor platelets by anticoagulation protocol: Yes   Plan:  Continue IV heparin at 2600 units/hr. Warfarin 15mg  x1  Heparin level, aPTT, INR, and CBC daily.  May DC aPTT once correlates with heparin level   Leota Sauers Pharm.D. CPP, BCPS Clinical Pharmacist [phone] 03/30/2023 4:46 PM    [facility] pharmacy phone numbers are listed on [email or web address]

## 2023-03-31 DIAGNOSIS — I4891 Unspecified atrial fibrillation: Secondary | ICD-10-CM | POA: Diagnosis not present

## 2023-03-31 LAB — HEPARIN LEVEL (UNFRACTIONATED)
Heparin Unfractionated: 0.75 [IU]/mL — ABNORMAL HIGH (ref 0.30–0.70)
Heparin Unfractionated: 0.5 [IU]/mL (ref 0.30–0.70)

## 2023-03-31 LAB — BASIC METABOLIC PANEL
Anion gap: 13 (ref 5–15)
BUN: 18 mg/dL (ref 8–23)
CO2: 21 mmol/L — ABNORMAL LOW (ref 22–32)
Calcium: 9.6 mg/dL (ref 8.9–10.3)
Chloride: 102 mmol/L (ref 98–111)
Creatinine, Ser: 1.31 mg/dL — ABNORMAL HIGH (ref 0.61–1.24)
GFR, Estimated: >60 mL/min (ref >60)
Glucose, Bld: 149 mg/dL — ABNORMAL HIGH (ref 70–99)
Potassium: 4.2 mmol/L (ref 3.5–5.1)
Sodium: 136 mmol/L (ref 135–145)

## 2023-03-31 LAB — APTT: aPTT: 130 s — ABNORMAL HIGH (ref 24–36)

## 2023-03-31 LAB — PROTIME-INR
INR: 1.6 — ABNORMAL HIGH (ref 0.8–1.2)
Prothrombin Time: 19.1 s — ABNORMAL HIGH (ref 11.4–15.2)

## 2023-03-31 LAB — CBC
HCT: 39.9 % (ref 39.0–52.0)
Hemoglobin: 13.1 g/dL (ref 13.0–17.0)
MCH: 29.3 pg (ref 26.0–34.0)
MCHC: 32.8 g/dL (ref 30.0–36.0)
MCV: 89.3 fL (ref 80.0–100.0)
Platelets: 294 10*3/uL (ref 150–400)
RBC: 4.47 MIL/uL (ref 4.22–5.81)
RDW: 13.5 % (ref 11.5–15.5)
WBC: 5.7 10*3/uL (ref 4.0–10.5)
nRBC: 0 % (ref 0.0–0.2)

## 2023-03-31 MED ORDER — WARFARIN SODIUM 7.5 MG PO TABS
7.5000 mg | ORAL_TABLET | Freq: Once | ORAL | Status: AC
  Administered 2023-03-31: 7.5 mg via ORAL
  Filled 2023-03-31: qty 1

## 2023-03-31 NOTE — Progress Notes (Signed)
 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5 12.9* 13.1  HCT 40.2 38.8* 39.9  PLT 302 270 294  APTT 78* 77* 130*  LABPROT 14.5 15.8* 19.1*  INR 1.1 1.2 1.6*  HEPARINUNFRC 1.05* 0.55 0.75*  CREATININE 1.25* 1.46* 1.31*    Estimated Creatinine Clearance: 90.5 mL/min (A) (by C-G formula based on SCr of 1.31 mg/dL (H)).   Medical History: Past Medical History:  Diagnosis Date   A-fib [facility])    DCCV 2009, 2012, 2014, Nov 2016   Anxiety    Arthritis    Knees, wrist, ankles   Cancer (HCC) 07/2019   Kidney   Colon cancer (HCC)    Diabetes mellitus without complication (HCC)    Dysrhythmia 1996   A- Fib   GERD (gastroesophageal reflux disease)    History of kidney stones    HX: anticoagulation    very remotely and briefly on COumadin aound one of his CV, 2014 on Eliquis had hematuria with renal calculi and stopped   Hyperlipidemia    patient denies this   Hypertension    IBS (irritable bowel syndrome)    Normal cardiac stress test    Normal nuclear stress test , 2001   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 Jimmey Ralph.   S/P ablation of atrial  fibrillation 02/21/2023   With Dr. Jimmey Ralph    Medications:  Medications Prior to Admission  Medication Sig Dispense Refill Last Dose/Taking   acetaminophen (TYLENOL) 500 MG tablet Take 1,500 mg by mouth daily as needed for moderate pain (pain score 4-6), fever or headache.   Past Week   amLODipine (NORVASC) 5 MG tablet Take 1 tablet (5 mg total) by mouth daily. (Patient taking differently: Take 5 mg by mouth at bedtime.) 90 tablet 2 03/24/2023 Bedtime   apixaban (ELIQUIS) 5 MG TABS tablet Take 1 tablet (5 mg total) by mouth 2 (two) times daily. 60 tablet 3 03/25/2023 at  7:00 AM   atorvastatin (LIPITOR) 20 MG tablet Take 1 tablet (20 mg total) by mouth at bedtime. (Patient taking differently: Take 20 mg by mouth daily.) 90 tablet 1 03/25/2023 Morning   Cholecalciferol (VITAMIN D-3 PO) Take 1 capsule by mouth daily.   03/25/2023 Morning   DULoxetine (CYMBALTA) 60 MG capsule Take 60 mg by mouth in the morning.   03/25/2023 Morning   MAGNESIUM PO Take 1 tablet by mouth at bedtime.   03/24/2023 Bedtime   metoprolol tartrate (LOPRESSOR) 100 MG tablet TAKE ONE TABLET BY MOUTH TWICE DAILY 180 tablet 1 03/25/2023 Morning   Multiple Vitamins-Minerals (ZINC PO) Take 1 tablet by mouth daily.   03/25/2023 Morning   OVER THE  COUNTER MEDICATION Take 1 capsule by mouth 2 (two) times daily. OTC supplement "Glucocil"   03/25/2023 Morning    Assessment: 62 YOM s/p TEE with noted left atrial clot w/ clot coming out of the left atrial appendage closure device/watchman device. This is considered an apixaban failure. Last dose of apixaban given 3/5 at 08:21. He was on a hep gtt prior to apixaban dose given this morning. Restarted at last known rate He has a history of using warfarin from February 2023. Last known dose was 10 mg on Monday, Wednesday, Friday, and Saturday. 7.5 mg on Sunday, Tuesday, and Thursday. This regimen resulted in an INR ~2.4  Heparin drip 2600 uts/hr with aptt increased 130sec and heparin level correlating at  0.73 supratherapeutic no bleeding noted CBC stable  INR 1.2>1.6 this am after 2 x warfarin boost 15mg  > will back down on dose today closer to old dosing regimen    Goal of Therapy:  INR 2-3 Heparin level 0.3-0.7 units/ml aPTT 66-102 seconds Monitor platelets by anticoagulation protocol: Yes   Plan:  Decrease IV heparin 2200 units/hr and recheck afternoon heparin level  Warfarin 7.5mg  x1  Heparin level, INR, and CBC daily.  May DC aPTT once correlates with heparin level   Leota Sauers Pharm.D. CPP, BCPS Clinical Pharmacist [phone] 03/31/2023 7:34 AM    [facility] pharmacy phone numbers are listed on [email or web address]

## 2023-03-31 NOTE — Progress Notes (Signed)
 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  Allergies  Allergen Reactions   Ace Inhibitors Cough   Cozaar [Losartan Potassium] Other (See Comments)    Hyperkalemia    Xarelto [Rivaroxaban] Other (See Comments)    Heart out of rhythm    Zestril [Lisinopril] Cough   Quinine Derivatives Rash    Patient Measurements: Height: 6\' 7"  (200.7 cm) Weight: (!) 140.3 kg (309 lb 4.9 oz) IBW/kg (Calculated) : 93.7 Heparin Dosing Weight: 124.1 kg  Vital Signs: Temp: 97.8 F (36.6 C) (03/09 1559) Temp Source: Oral (03/09 1559) BP: 128/91 (03/09 1559) Pulse Rate: 77 (03/09 1559)  Labs: Recent Labs    03/29/23 0236 03/30/23 0212 03/31/23 0321 03/31/23 1423  HGB 13.5 12.9* 13.1  --   HCT 40.2 38.8* 39.9  --   PLT 302 270 294  --   APTT 78* 77* 130*  --   LABPROT 14.5 15.8* 19.1*  --   INR 1.1 1.2 1.6*  --   HEPARINUNFRC 1.05* 0.55 0.75* 0.50  CREATININE 1.25* 1.46* 1.31*  --     Estimated Creatinine Clearance: 90.5 mL/min (A) (by C-G formula based on SCr of 1.31 mg/dL (H)).   Medical History: Past Medical History:  Diagnosis Date   A-fib [facility])    DCCV 2009, 2012, 2014, Nov 2016   Anxiety    Arthritis    Knees, wrist, ankles   Cancer (HCC) 07/2019   Kidney   Colon cancer (HCC)    Diabetes mellitus without complication (HCC)    Dysrhythmia 1996   A- Fib   GERD (gastroesophageal reflux disease)    History of kidney stones    HX: anticoagulation    very remotely and briefly on COumadin aound one of his CV, 2014 on Eliquis had hematuria with renal calculi and stopped   Hyperlipidemia    patient denies this   Hypertension    IBS (irritable bowel syndrome)    Normal cardiac stress test    Normal nuclear stress test , 2001   Presence of Watchman left atrial appendage closure device 02/21/2023   31mm Watchman FLX Pro device placed during concomitant AF  ablation/LAAO with Dr. Jimmey Ralph.   S/P ablation of atrial fibrillation 02/21/2023   With Dr. Jimmey Ralph    Medications:  Medications Prior to Admission  Medication Sig Dispense Refill Last Dose/Taking   acetaminophen (TYLENOL) 500 MG tablet Take 1,500 mg by mouth daily as needed for moderate pain (pain score 4-6), fever or headache.   Past Week   amLODipine (NORVASC) 5 MG tablet Take 1 tablet (5 mg total) by mouth daily. (Patient taking differently: Take 5 mg by mouth at bedtime.) 90 tablet 2 03/24/2023 Bedtime   apixaban (ELIQUIS) 5 MG TABS tablet Take 1 tablet (5 mg total) by mouth 2 (two) times daily. 60 tablet 3 03/25/2023 at  7:00 AM   atorvastatin (LIPITOR) 20 MG tablet Take 1 tablet (20 mg total) by mouth at bedtime. (Patient taking differently: Take 20 mg by mouth daily.) 90 tablet 1 03/25/2023 Morning   Cholecalciferol (VITAMIN D-3 PO) Take 1 capsule by mouth daily.   03/25/2023 Morning   DULoxetine (CYMBALTA) 60 MG capsule Take 60 mg by mouth in the morning.   03/25/2023 Morning   MAGNESIUM PO Take 1 tablet by mouth at bedtime.   03/24/2023 Bedtime   metoprolol tartrate (LOPRESSOR) 100 MG tablet TAKE ONE TABLET BY MOUTH TWICE DAILY 180  tablet 1 03/25/2023 Morning   Multiple Vitamins-Minerals (ZINC PO) Take 1 tablet by mouth daily.   03/25/2023 Morning   OVER THE COUNTER MEDICATION Take 1 capsule by mouth 2 (two) times daily. OTC supplement "Glucocil"   03/25/2023 Morning    Assessment: 62 YOM s/p TEE with noted left atrial clot w/ clot coming out of the left atrial appendage closure device/watchman device. This is considered an apixaban failure. Last dose of apixaban given 3/5 at 08:21. He was on a hep gtt prior to apixaban dose given this morning. Restarted at last known rate He has a history of using warfarin from February 2023. Last known dose was 10 mg on Monday, Wednesday, Friday, and Saturday. 7.5 mg on Sunday, Tuesday, and Thursday. This regimen resulted in an INR ~2.4  Heparin level therapeutic  0.50 on 2200 after rate decrease. No bleeding noted CBC stable  INR 1.2>1.6 this am after 2 x warfarin boost 15mg  > will back down on dose today closer to old dosing regimen    Goal of Therapy:  INR 2-3 Heparin level 0.3-0.7 units/ml aPTT 66-102 seconds Monitor platelets by anticoagulation protocol: Yes   Plan:  Continue IV heparin 2200 units/hr Warfarin 7.5mg  x1  Heparin level, INR, and CBC daily.  May DC aPTT once correlates with heparin level  Ruben Im, PharmD Clinical Pharmacist 03/31/2023 4:43 PM Please check AMION for all [facility] Pharmacy numbers

## 2023-03-31 NOTE — Plan of Care (Signed)

## 2023-03-31 NOTE — Progress Notes (Addendum)
 Rounding Note    Patient Name: Bradley Rodriguez Date of Encounter: 03/31/2023  [facility] Cardiologist: Dina Rich, MD   Subjective   No ongoing complaints  Inpatient Medications    Scheduled Meds:  aspirin  81 mg Oral Daily   atorvastatin  20 mg Oral QHS   diltiazem  60 mg Oral Q12H   DULoxetine  60 mg Oral q AM   metoprolol succinate  100 mg Oral BID   sodium chloride flush  3 mL Intravenous Q12H   warfarin  7.5 mg Oral ONCE-1600   Warfarin - Pharmacist Dosing Inpatient   Does not apply q1600   Continuous Infusions:  heparin 2,600 Units/hr (03/31/23 0636)   PRN Meds: acetaminophen, nitroGLYCERIN, sodium chloride flush   Vital Signs    Vitals:   03/30/23 1918 03/30/23 2303 03/31/23 0416 03/31/23 0716  BP: 107/86 133/85 124/82 117/83  Pulse: 86 (!) 58  61  Resp: 16 17 19 13   Temp: 97.9 F (36.6 C) 97.9 F (36.6 C) 98 F (36.7 C) 97.7 F (36.5 C)  TempSrc: Oral Oral Oral Oral  SpO2: 97%   96%  Weight:      Height:        Intake/Output Summary (Last 24 hours) at 03/31/2023 0747 Last data filed at 03/31/2023 0718 Gross per 24 hour  Intake 938.38 ml  Output 450 ml  Net 488.38 ml      03/26/2023   10:12 AM 03/25/2023    9:28 AM 03/04/2023    3:23 PM  Last 3 Weights  Weight (lbs) 309 lb 4.9 oz 305 lb 317 lb 3.2 oz  Weight (kg) 140.3 kg 138.347 kg 143.881 kg      Telemetry    AFib 80-90's mostly - Personally Reviewed  ECG    No new EKGs - Personally Reviewed  Physical Exam    no changes to his exam today GEN: No acute distress.   Neck: No JVD Cardiac: irreg-irreg, no murmurs, rubs, or gallops.  Respiratory: CTA b/l. GI: Soft, nontender, non-distended  MS: No edema; No deformity. Neuro:  Nonfocal  Psych: Normal affect   Labs    High Sensitivity Troponin:   Recent Labs  Lab 03/25/23 0941 03/25/23 1129 03/25/23 1748 03/25/23 2112  TROPONINIHS 12 82* 430* 749*     Chemistry Recent Labs  Lab 03/29/23 0236 03/30/23 0212  03/31/23 0321  NA 137 137 136  K 4.3 4.5 4.2  CL 106 105 102  CO2 21* 19* 21*  GLUCOSE 162* 155* 149*  BUN 19 25* 18  CREATININE 1.25* 1.46* 1.31*  CALCIUM 9.2 9.2 9.6  GFRNONAA >60 53* >60  ANIONGAP 10 13 13     Lipids No results for input(s): "CHOL", "TRIG", "HDL", "LABVLDL", "LDLCALC", "CHOLHDL" in the last 168 hours.  Hematology Recent Labs  Lab 03/29/23 0236 03/30/23 0212 03/31/23 0321  WBC 5.5 5.5 5.7  RBC 4.58 4.30 4.47  HGB 13.5 12.9* 13.1  HCT 40.2 38.8* 39.9  MCV 87.8 90.2 89.3  MCH 29.5 30.0 29.3  MCHC 33.6 33.2 32.8  RDW 13.3 13.5 13.5  PLT 302 270 294   Thyroid No results for input(s): "TSH", "FREET4" in the last 168 hours.  BNPNo results for input(s): "BNP", "PROBNP" in the last 168 hours.  DDimer No results for input(s): "DDIMER" in the last 168 hours.   Radiology      Cardiac Studies   TEE 03/27/23 Mildly depressed ejection fraction, mild to moderate mitral regurgitation, left atrial thrombus  adherent to the LAA occluder Cardioversion not performed. Full report to follow. Further management per primary team.   03/25/23:  LHC   Prox RCA lesion is 20% stenosed.   2nd Mrg lesion is 20% stenosed.   Dist LAD lesion is 20% stenosed.   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 Successful PVI 2. Successful ablation/isolation of the posterior wall 3. Intracardiac echo reveals normal LV size and function, trivial pericardial effusion, 4 distinct pulmonary veins. 4. Successful implantation of a 31mm WATCHMAN FLX PRO left atrial appendage occlusive device    5. No early apparent complications.   Post Implant Anticoagulation Strategy: Continue Eliquis 5mg  twice daily for 3 months then transition to dual anti-platelet therapy.      02/21/23: TEE  1. TEE guided LAA closure. 31 mm Watchman FLX placed with 25%  compression. No device leak. Small iatrogenic ASD with L to R shunting  after the procedure.  Trivial pericardial effusion before and after the  procedure. 3D echo confirmed device size and  correct placement. No immediate complications.   2. Left ventricular ejection fraction, by estimation, is 50 to 55%. The  left ventricle has low normal function.   3. Right ventricular systolic function is mildly reduced. The right  ventricular size is mildly enlarged.   4. Left atrial size was mildly dilated. No left atrial/left atrial  appendage thrombus was detected.   5. The mitral valve is grossly normal. Mild to moderate mitral valve  regurgitation. No evidence of mitral stenosis.   6. The aortic valve is tricuspid. Aortic valve regurgitation is not  visualized. No aortic stenosis is present.   7. There is mild (Grade II) layered plaque involving the descending  aorta.      02/01/23: Cardiac CT IMPRESSION: 1. The left atrial appendage is a large chicken wing morphology without thrombus.   2. A 31 mm Watchman FLX device is recommended based on the above landing zone measurements.   3. A mid/mid IAS puncture site is recommended.   4. Optimal deployment angle: RAO 10 CRA 15   5. Small PFO.   6. CAC score of 253, which is 73rd percentile for age-, sex-, and race-matched controls.   7. Mildly dilated aortic root up to 43 mm.  Patient Profile     62 y.o. male HTN, HLD, DM, colon Ca/GIB, AFib admitted with symptomatic Afib, CP  Recently: AFib ablation with watchman implant 02/21/23   Assessment & Plan    Persistent Afib - unable to cardiovert at this time due to LAA thrombus - rate control for now: - continue metoprolol succinate 100mg  BID.  - Continue diltiazem 60mg  BID. Can uptitrate as needed and transition to long acting on discharge.   Atrial thrombus, s/p Watchman LAA occlusion noted on TEE (DCCV deferred) Occurred while therapeutic on eliquis Watchman is well-seated, without leak continue heparin gtt, awaiting therapeutic INR Repeat TTE in 4-6 weeks  INR today  1.6 Coag/heme labs drawn Hx of colon/renal ca -- no evidence of recurrence on CT  Dizzy More so off balance when up the morning of his admission This is not an ongoing complaint Dr. Jimmey Ralph 03/28/23 d/w patient concerns his presenting symptom may have been a small stroke Symptoms remains completely resolved +/- TIA Discussed neuro consult and formal evaluation but the patient declined with resolved symptoms    NSTEMI/chest discomfort (?) Denies this as a chest pain Similar symptom for 2-3 days post procedure HS Trop behavior, unusual at  12 >> 82 > 430 > 749 Vague nondescript complaint denies ongoing here HS Trops c/w NTSEMI LHC 3/4 without significant CAD     For questions or updates, please contact [facility] Please consult [email or web address] for contact info under        Signed, Maurice Small, MD  03/31/2023, 7:47 AM

## 2023-04-01 ENCOUNTER — Telehealth: Payer: Self-pay | Admitting: Cardiology

## 2023-04-01 DIAGNOSIS — I4819 Other persistent atrial fibrillation: Secondary | ICD-10-CM

## 2023-04-01 DIAGNOSIS — I214 Non-ST elevation (NSTEMI) myocardial infarction: Secondary | ICD-10-CM | POA: Diagnosis not present

## 2023-04-01 DIAGNOSIS — I4891 Unspecified atrial fibrillation: Secondary | ICD-10-CM | POA: Diagnosis not present

## 2023-04-01 LAB — BASIC METABOLIC PANEL
Anion gap: 13 (ref 5–15)
BUN: 19 mg/dL (ref 8–23)
CO2: 21 mmol/L — ABNORMAL LOW (ref 22–32)
Calcium: 9.4 mg/dL (ref 8.9–10.3)
Chloride: 102 mmol/L (ref 98–111)
Creatinine, Ser: 1.42 mg/dL — ABNORMAL HIGH (ref 0.61–1.24)
GFR, Estimated: 55 mL/min — ABNORMAL LOW (ref >60)
Glucose, Bld: 136 mg/dL — ABNORMAL HIGH (ref 70–99)
Potassium: 4.5 mmol/L (ref 3.5–5.1)
Sodium: 136 mmol/L (ref 135–145)

## 2023-04-01 LAB — PROTIME-INR
INR: 1.9 — ABNORMAL HIGH (ref 0.8–1.2)
INR: 2 — ABNORMAL HIGH (ref 0.8–1.2)
Prothrombin Time: 22.2 s — ABNORMAL HIGH (ref 11.4–15.2)
Prothrombin Time: 22.7 s — ABNORMAL HIGH (ref 11.4–15.2)

## 2023-04-01 LAB — APTT: aPTT: 111 s — ABNORMAL HIGH (ref 24–36)

## 2023-04-01 LAB — CBC
HCT: 40 % (ref 39.0–52.0)
Hemoglobin: 13.4 g/dL (ref 13.0–17.0)
MCH: 29.7 pg (ref 26.0–34.0)
MCHC: 33.5 g/dL (ref 30.0–36.0)
MCV: 88.7 fL (ref 80.0–100.0)
Platelets: 293 10*3/uL (ref 150–400)
RBC: 4.51 MIL/uL (ref 4.22–5.81)
RDW: 13.6 % (ref 11.5–15.5)
WBC: 5.7 10*3/uL (ref 4.0–10.5)
nRBC: 0 % (ref 0.0–0.2)

## 2023-04-01 LAB — HEPARIN LEVEL (UNFRACTIONATED): Heparin Unfractionated: 0.45 [IU]/mL (ref 0.30–0.70)

## 2023-04-01 MED ORDER — DILTIAZEM HCL ER COATED BEADS 120 MG PO CP24: 120.0000 mg | ORAL_CAPSULE | Freq: Every day | ORAL | 11 refills | Status: DC

## 2023-04-01 MED ORDER — WARFARIN SODIUM 5 MG PO TABS: 10.0000 mg | ORAL_TABLET | Freq: Once | ORAL | Status: DC

## 2023-04-01 MED ORDER — METOPROLOL SUCCINATE ER 100 MG PO TB24
100.0000 mg | ORAL_TABLET | Freq: Two times a day (BID) | ORAL | 11 refills | Status: DC
Start: 2023-04-01 — End: 2023-09-11

## 2023-04-01 MED ORDER — WARFARIN SODIUM 7.5 MG PO TABS: 15.0000 mg | ORAL_TABLET | Freq: Once | ORAL | Status: DC

## 2023-04-01 MED ORDER — WARFARIN SODIUM 5 MG PO TABS: 10.0000 mg | ORAL_TABLET | Freq: Once | ORAL | 3 refills | Status: DC

## 2023-04-01 MED ORDER — WARFARIN SODIUM 7.5 MG PO TABS
15.0000 mg | ORAL_TABLET | Freq: Once | ORAL | Status: AC
  Administered 2023-04-01: 15 mg via ORAL
  Filled 2023-04-01: qty 2

## 2023-04-01 MED ORDER — ASPIRIN 81 MG PO CHEW: 81.0000 mg | CHEWABLE_TABLET | Freq: Every day | ORAL | Status: AC

## 2023-04-01 NOTE — Discharge Summary (Cosign Needed Addendum)
 ELECTROPHYSIOLOGY DISCHARGE SUMMARY    Patient ID: Bradley Rodriguez,  MRN: [id number], DOB/AGE: 1960-08-17 63 y.o.  Admit date: 03/25/2023 Discharge date: 04/01/2023  Primary Care Physician: Richardean Chimera, MD  Primary Cardiologist: Dina Rich, MD  Electrophysiologist: Dr. Jimmey Ralph   Primary Discharge Diagnosis:  Persistent Atrial Fibrillation s/p Ablation and Watchman (02/21/23)   Secondary Discharge Diagnosis:  LA Thrombus  Secondary Hypercoagulable State  Dizziness  NSTEMI / Chest Discomfort  Hx Colon, Renal Cancer  Hx GIB  Procedures This Admission:  LHC  TEE w/o DCCV      Brief HPI: Bradley WITUCKI is a 63 y.o. male with a history of AF s/p ablation & LAA closure / Watchman, renal/colon cancer admitted for dizziness and chest discomfort.   The patient underwent Watchman 02/21/23 with small right groin hematoma. Subsequent follow up in EP clinic with significant improvement in groin bruising / hematoma.    Day of admit, he felt so dizzy / off balance that he did not feel comfortable driving his grandchildren to school.   Hospital Course:  The patient was admitted on 03/25/23 with reports of dizziness and an "odd sensation in his chest".  Given chest discomfort / NSTEMI on admit, LHC he underwent a LHC which showed non-obs CAD.  Patient initially planned for TEE/DCCV for AF, subsequently found to have LA thrombus (despite eliquis compliance).  He was transitioned to heparin infusion & coumadin.  Questionable TIA on admit with dizziness but pt deferred Neuro evaluation. Pending transition from heparin to coumadin, INR 1.9 on 04/01/23. The patient was hopeful for discharge 04/01/23.  Subsequent INR in the afternoon 2 and patient was cleared for discharge. Goal INR 2.5-3.   He will remain on ASA + warfarin until repeat Watchman imaging completed.    They were monitored on telemetry overnight which demonstrated AF 70-105bpm. The patient was examined and considered to be stable  for discharge.   He has follow up arranged in [place] for INR monitoring. The patient will be seen back by EP APP on 04/12/23 for post ablation follow up, to arrange repeat TEE on 05/01/23 to review LA thrombus post hospital care.   Physical Exam: Vitals:   03/31/23 2256 04/01/23 0615 04/01/23 0735 04/01/23 1108  BP: 110/87 123/86 (!) 132/100 (!) 135/91  Pulse: 85 84    Resp: 15 20 16 14   Temp: 98.1 F (36.7 C) 97.7 F (36.5 C) 97.7 F (36.5 C) 97.8 F (36.6 C)  TempSrc: Oral Oral Oral Oral  SpO2: 96% 94%  97%  Weight:      Height:        GEN- NAD. A&O x 3.   HEENT: Normocephalic, atraumatic Lungs- CTAB, Normal effort.  Heart- RRR, No M/G/R.  GI- Soft, NT, ND.  Extremities- No clubbing, cyanosis, or edema; Groin without hematoma    Studies:  Cardiac CT 02/01/23 > LAA is a large chicken wing morphology w/o thrombus, small PFO, CAC 253 which is 73rd percentile for matched controls TEE 02/21/23 > LVEF 50-55%, LA mildly dilated EPS 02/21/23 > successful PVI, ablation / isolation of the posterior wall, ICE reveals normal LV size / function, trivial pericardial effusion, successful implantation of 31 mm Watchman FLX Pro LAA Occlusive device TEE 03/26/33 > mildly depressed ejection fraction, mild to mod MV regurgitation, LA thrombus adherent to LAA occlusive device, cardioversion not performed  LHC 03/25/23 > proximal RCA lesion 20% stenosed, 2nd Mrg lesion 20% stenosed, distal LAD lesion 20% stenosed > mild non-obs CAD,  LVEDP   Arrhythmia / AAD AF  AF ablation & LAA Occlusion 02/21/23   Discharge Medications:  Allergies as of 04/01/2023       Reactions   Ace Inhibitors Cough   Cozaar [losartan Potassium] Other (See Comments)   Hyperkalemia    Xarelto [rivaroxaban] Other (See Comments)   Heart out of rhythm    Zestril [lisinopril] Cough   Quinine Derivatives Rash        Medication List     STOP taking these medications    apixaban 5 MG Tabs tablet Commonly known  as: Eliquis   metoprolol tartrate 100 MG tablet Commonly known as: LOPRESSOR       TAKE these medications    acetaminophen 500 MG tablet Commonly known as: TYLENOL Take 1,500 mg by mouth daily as needed for moderate pain (pain score 4-6), fever or headache.   amLODipine 5 MG tablet Commonly known as: NORVASC Take 1 tablet (5 mg total) by mouth daily. What changed: when to take this   aspirin 81 MG chewable tablet Chew 1 tablet (81 mg total) by mouth daily. Start taking on: April 02, 2023   atorvastatin 20 MG tablet Commonly known as: LIPITOR Take 1 tablet (20 mg total) by mouth at bedtime. What changed: when to take this   diltiazem 120 MG 24 hr capsule Commonly known as: Cardizem CD Take 1 capsule (120 mg total) by mouth daily.   DULoxetine 60 MG capsule Commonly known as: CYMBALTA Take 60 mg by mouth in the morning.   MAGNESIUM PO Take 1 tablet by mouth at bedtime.   metoprolol succinate 100 MG 24 hr tablet Commonly known as: TOPROL-XL Take 1 tablet (100 mg total) by mouth 2 (two) times daily. Take with or immediately following a meal.   OVER THE COUNTER MEDICATION Take 1 capsule by mouth 2 (two) times daily. OTC supplement "Glucocil"   VITAMIN D-3 PO Take 1 capsule by mouth daily.   warfarin 5 MG tablet Commonly known as: COUMADIN Take 2 tablets (10 mg total) by mouth one time only at 4 PM. Start taking on: April 02, 2023   ZINC PO Take 1 tablet by mouth daily.        Disposition: Home with usual follow up as in AVS  Signed, Canary Brim, NP-C, AGACNP-BC [facility] - Electrophysiology  04/01/2023, 4:18 PM  I have seen, examined the patient, and reviewed the above assessment and plan.    Hospital Course: Bradley Rodriguez is a 63 y.o. male with a hx of HTN, HLD, DM, renal cell cancer s/p nephrectomy, colon cancer s/p partial colectomy, GI bleeding, and persistent atrial fibrillation status post concomitant catheter ablation and watchman  implant on 02/21/2023. He presented to the ED with a symptomatic recurrence of atrial fibrillation. Cardiac enzymes were checked in ED and found to be elevated and uptrending. He was treated for potential NSTEMI and underwent LHC which showed non-obstructive CAD. He was rate controlled with plans for TEE and DCCV. TEE, however, revealed a device related thrombus on the surface of his Watchman device, so cardioversion was not performed. Patient was transitioned from Eliquis to warfarin with heparin bridging. Once INR was therapeutic, patient was discharged home.   GEN: No acute distress.   Cardiac: Normal rate, irregular.  Resp: Normal work of breathing.  Ext: No edema.  Psych: Normal affect  Neuro: No gross focal deficits.   Assessment and Plan:  #. Persistent atrial fibrillation: Has history of longstanding persistent atrial  fibrillation and has failed antiarrhythmic drug therapy previously.  Maintained sinus rhythm for approximately 24 days after catheter ablation.  Unfortunately now with early recurrence. Rates have improved since starting diltiazem. Remain reasonably well controlled at rest.  - Continue metoprolol succinate 100mg  BID on discharge.  - Transition short acting diltiazem to long acting 120mg  once daily.   #. S/p Watchman device: Underwent TEE with well seated Watchman device with no leak; however, device related thrombus was seen on surface of Watchman device.  #. Device related thrombus:  -No plans for cardioversion or anti-arrhythmics due to thrombus. Will rate control with metoprolol and diltiazem as above.  -Continue warfarin, would ideally run higher INR goal, 2.5-3.0 if no bleeding. Follow up in Coumadin Clinic. -Repeat TEE in 4-6 weeks. -Hypercoagulable panel collected, some results still pending.  -CT scan of abdomen and pelvis was unremarkable for recurrence of malignancy.  Duration of Discharge Encounter: 45 minutes MD time: 25 minutes  Nobie Putnam,  MD 04/04/2023 9:46 PM

## 2023-04-01 NOTE — Progress Notes (Signed)
 Mobility Specialist Progress Note;   04/01/23 0910  Mobility  Activity Ambulated with assistance in hallway  Level of Assistance Standby assist, set-up cues, supervision of patient - no hands on  Assistive Device None  Distance Ambulated (ft) 450 ft  Activity Response Tolerated well  Mobility Referral Yes  Mobility visit 1 Mobility  Mobility Specialist Start Time (ACUTE ONLY) 0910  Mobility Specialist Stop Time (ACUTE ONLY) 0920  Mobility Specialist Time Calculation (min) (ACUTE ONLY) 10 min    Pre-mobility: HR 97bpm During-mobility: HR up to 126bpm Post-mobility: HR 108bpm  Pt agreeable to mobility. Required no physical assistance during ambulation. VSS and no c/o when asked. Pt returned back to bed with all needs met. RN notified.   Caesar Bookman Mobility Specialist Please contact via SecureChat or [employer] [phone]

## 2023-04-01 NOTE — Plan of Care (Signed)
 Problem: Education: Goal: Knowledge of General Education information will improve Description: Including pain rating scale, medication(s)/side effects and non-pharmacologic comfort measures 04/01/2023 1611 by Anders Simmonds, RN Outcome: Adequate for Discharge 04/01/2023 1530 by Anders Simmonds, RN Outcome: Progressing   Problem: Health Behavior/Discharge Planning: Goal: Ability to manage health-related needs will improve 04/01/2023 1611 by Anders Simmonds, RN Outcome: Adequate for Discharge 04/01/2023 1530 by Anders Simmonds, RN Outcome: Progressing   Problem: Clinical Measurements: Goal: Ability to maintain clinical measurements within normal limits will improve 04/01/2023 1611 by Anders Simmonds, RN Outcome: Adequate for Discharge 04/01/2023 1530 by Anders Simmonds, RN Outcome: Progressing Goal: Will remain free from infection 04/01/2023 1611 by Anders Simmonds, RN Outcome: Adequate for Discharge 04/01/2023 1530 by Anders Simmonds, RN Outcome: Progressing Goal: Diagnostic test results will improve 04/01/2023 1611 by Anders Simmonds, RN Outcome: Adequate for Discharge 04/01/2023 1530 by Anders Simmonds, RN Outcome: Progressing Goal: Respiratory complications will improve 04/01/2023 1611 by Anders Simmonds, RN Outcome: Adequate for Discharge 04/01/2023 1530 by Anders Simmonds, RN Outcome: Progressing Goal: Cardiovascular complication will be avoided 04/01/2023 1611 by Anders Simmonds, RN Outcome: Adequate for Discharge 04/01/2023 1530 by Anders Simmonds, RN Outcome: Progressing   Problem: Activity: Goal: Risk for activity intolerance will decrease 04/01/2023 1611 by Anders Simmonds, RN Outcome: Adequate for Discharge 04/01/2023 1530 by Anders Simmonds, RN Outcome: Progressing   Problem: Nutrition: Goal: Adequate nutrition will be maintained 04/01/2023 1611 by Anders Simmonds, RN Outcome: Adequate for Discharge 04/01/2023 1530 by Anders Simmonds, RN Outcome: Progressing    Problem: Coping: Goal: Level of anxiety will decrease 04/01/2023 1611 by Anders Simmonds, RN Outcome: Adequate for Discharge 04/01/2023 1530 by Anders Simmonds, RN Outcome: Progressing   Problem: Elimination: Goal: Will not experience complications related to bowel motility 04/01/2023 1611 by Anders Simmonds, RN Outcome: Adequate for Discharge 04/01/2023 1530 by Anders Simmonds, RN Outcome: Progressing Goal: Will not experience complications related to urinary retention 04/01/2023 1611 by Anders Simmonds, RN Outcome: Adequate for Discharge 04/01/2023 1530 by Anders Simmonds, RN Outcome: Progressing   Problem: Pain Managment: Goal: General experience of comfort will improve and/or be controlled 04/01/2023 1611 by Anders Simmonds, RN Outcome: Adequate for Discharge 04/01/2023 1530 by Anders Simmonds, RN Outcome: Progressing   Problem: Safety: Goal: Ability to remain free from injury will improve 04/01/2023 1611 by Anders Simmonds, RN Outcome: Adequate for Discharge 04/01/2023 1530 by Anders Simmonds, RN Outcome: Progressing   Problem: Skin Integrity: Goal: Risk for impaired skin integrity will decrease 04/01/2023 1611 by Anders Simmonds, RN Outcome: Adequate for Discharge 04/01/2023 1530 by Anders Simmonds, RN Outcome: Progressing   Problem: Education: Goal: Understanding of CV disease, CV risk reduction, and recovery process will improve 04/01/2023 1611 by Anders Simmonds, RN Outcome: Adequate for Discharge 04/01/2023 1530 by Anders Simmonds, RN Outcome: Progressing Goal: Individualized Educational Video(s) 04/01/2023 1611 by Anders Simmonds, RN Outcome: Adequate for Discharge 04/01/2023 1530 by Anders Simmonds, RN Outcome: Progressing   Problem: Activity: Goal: Ability to return to baseline activity level will improve 04/01/2023 1611 by Anders Simmonds, RN Outcome: Adequate for Discharge 04/01/2023 1530 by Anders Simmonds, RN Outcome: Progressing   Problem:  Cardiovascular: Goal: Ability to achieve and maintain adequate cardiovascular perfusion will improve 04/01/2023 1611 by Anders Simmonds, RN Outcome: Adequate for Discharge 04/01/2023 1530 by Anders Simmonds, RN Outcome: Progressing  Goal: Vascular access site(s) Level 0-1 will be maintained 04/01/2023 1611 by Anders Simmonds, RN Outcome: Adequate for Discharge 04/01/2023 1530 by Anders Simmonds, RN Outcome: Progressing   Problem: Health Behavior/Discharge Planning: Goal: Ability to safely manage health-related needs after discharge will improve 04/01/2023 1611 by Anders Simmonds, RN Outcome: Adequate for Discharge 04/01/2023 1530 by Anders Simmonds, RN Outcome: Progressing

## 2023-04-01 NOTE — Progress Notes (Signed)
 Called patient and verified his coumadin dosing with him via phone.  Confirmed his pharmacy has his coumadin and he will pick up in the am.  Reviewed his dose for tomorrow is 10 mg in the evening and then follow up in Coumadin Clinic on Wednesday for determination of next dose.  Patient indicates verbal understanding.    Canary Brim, NP-C, AGACNP-BC [facility] - Electrophysiology  04/01/2023, 6:58 PM

## 2023-04-01 NOTE — Telephone Encounter (Signed)
 Received page by nurse reporting that pharmacy did not have his warfarin 5 mg.  Per discharge he is supposed to take a total of 10 mg tomorrow.  I called in a verbal and provided him 70 tablets with 3 refills reflective of the DC plan.  He reports he will also get established with a warfarin clinic after tomorrow's dose but sending to EP discharger to clarify plan of warfarin.

## 2023-04-01 NOTE — Plan of Care (Signed)

## 2023-04-01 NOTE — Progress Notes (Addendum)
 Patient Name: Bradley Rodriguez Date of Encounter: 04/01/2023  Primary Cardiologist: Dina Rich, MD Electrophysiologist: Nobie Putnam, MD  Interval Summary   The patient is doing well today.  Wife at bedside. Questions what his INR is today. At this time, the patient denies chest pain, shortness of breath, or any new concerns.  Vital Signs    Vitals:   03/31/23 1917 03/31/23 2256 04/01/23 0615 04/01/23 0735  BP: (!) 130/90 110/87 123/86 (!) 132/100  Pulse: 85 85 84   Resp: 20 15 20 16   Temp: 98 F (36.7 C) 98.1 F (36.7 C) 97.7 F (36.5 C) 97.7 F (36.5 C)  TempSrc: Oral Oral Oral Oral  SpO2: 94% 96% 94%   Weight:      Height:        Intake/Output Summary (Last 24 hours) at 04/01/2023 0743 Last data filed at 03/31/2023 2332 Gross per 24 hour  Intake 434.15 ml  Output 525 ml  Net -90.85 ml   Filed Weights   03/25/23 0928 03/26/23 1012  Weight: (!) 138.3 kg (!) 140.3 kg    Physical Exam    GEN- The patient is well appearing, alert and oriented x 3 today.   Lungs- Clear to ausculation bilaterally, normal work of breathing Cardiac- Regular rate and rhythm, no murmurs, rubs or gallops GI- soft, NT, ND, + BS Extremities- no clubbing or cyanosis. No edema  Telemetry    AF 70-105 bpm (personally reviewed)   Studies:  Cardiac CT 02/01/23 > LAA is a large chicken wing morphology w/o thrombus, small PFO, CAC 253 which is 73rd percentile for matched controls TEE 02/21/23 > LVEF 50-55%, LA mildly dilated EPS 02/21/23 > successful PVI, ablation / isolation of the posterior wall, ICE reveals normal LV size / function, trivial pericardial effusion, successful implantation of 31 mm Watchman FLX Pro LAA Occlusive device TEE 03/26/33 > mildly depressed ejection fraction, mild to mod MV regurgitation, LA thrombus adherent to LAA occlusive device, cardioversion not performed  LHC 03/25/23 > proximal RCA lesion 20% stenosed, 2nd Mrg lesion 20% stenosed, distal LAD lesion 20% stenosed >  mild non-obs CAD, LVEDP   Arrhythmia / AAD AF  AF ablation & LAA Occlusion 02/21/23  Hospital Course    Bradley Rodriguez is a 63 y.o. male with h/o AF s/p ablation & LAA closure / Watchman, renal/colon cancer who presented to [facility] ER on 03/25/23 with reports of dizziness and an "odd sensation in his chest".  Patient initially planned for TEE/DCCV for AF, subsequently found to have LA thrombus (despite eliquis).  He was transitioned to heparin infusion & coumadin.  NSTEMI on admit, LHC with non-obs CAD. Questionable TIA on admit with dizziness but pt deferred Neuro evaluation. Pending transition from heparin to coumadin, INR 1.9 on 3/10.  Assessment & Plan    Persistent Atrial Fibrillation s/p Ablation & Watchman  -continue metoprolol 100mg  BID  -diltiazem 60 mg BID  -rate control for now given LA thrombus   LA Thrombus  Noted on TEE, DCCV deferred, occurred  while on Eliqus, Watchman well sealed without leak.  Hx of renal/colon CA, up to date on screenings.  -continue heparin gtt until INR therapeutic  -repeat TEE in 4-6 weeks  Secondary Hypercoagulable State  -continue coumadin per pharmacy   Dizziness  On admission, felt off balance  -improved, +/- TIA  -previously discussed Neuro consult/formal evaluation, but pt declined with resolution of symptoms  NSTEMI/Chest Discomfort  Troponin 12 >82>430> 749 -LHC 3/3 with non-obs CAD  For questions or updates, please contact [facility] Please consult [email or web address] for contact info under Cardiology/STEMI.  Signed, Canary Brim, NP-C, AGACNP-BC [facility] - Electrophysiology  04/01/2023, 8:13 AM   I have seen, examined the patient, and reviewed the above assessment and plan.    Interval: No acute overnight events. Patient reports feeling relatively well. No new or acute complaints.   GEN: No acute distress.   Cardiac: Normal rate, irregular.  Resp: Normal work of breathing.  Ext: No edema.  Psych:  Normal affect   INR 1.9 today.  Tele: Remains in AF, mainly rate controlled.   Assessment and plan:  #. Persistent atrial fibrillation: Has history of longstanding persistent atrial fibrillation and has failed antiarrhythmic drug therapy previously.  Maintained sinus rhythm for approximately 24 days after catheter ablation.  Unfortunately now with early recurrence. Rates have improved since starting diltiazem. Remain reasonably well controlled at rest.  - Continue metoprolol succinate 100mg  BID. - Continue diltiazem 60mg  BID. Can uptitrate as needed and transition to long acting on discharge.    #. S/p Watchman device: Underwent TEE with well seated Watchman device with no leak; however, device related thrombus was seen on surface of Watchman device.  #. Device related thrombus:  -No plans for cardioversion or anti-arrhythmics due to thrombus. Will rate control with metoprolol and diltiazem as above.  -Continue IV heparin with bridging to therapeutic warfarin.He was already planned for therapeutic anti-coagulation for at least 90 days after ablation. INR 1.9 today. -Repeat TEE in 4-6 weeks. -Hypercoagulable panel collected, some results still pending.  -CT scan of abdomen and pelvis was unremarkable.     Nobie Putnam, MD 04/01/2023 1:27 PM

## 2023-04-01 NOTE — Progress Notes (Signed)
 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  Allergies  Allergen Reactions   Ace Inhibitors Cough   Cozaar [Losartan Potassium] Other (See Comments)    Hyperkalemia    Xarelto [Rivaroxaban] Other (See Comments)    Heart out of rhythm    Zestril [Lisinopril] Cough   Quinine Derivatives Rash    Patient Measurements: Height: 6\' 7"  (200.7 cm) Weight: (!) 140.3 kg (309 lb 4.9 oz) IBW/kg (Calculated) : 93.7 Heparin Dosing Weight: 124.1 kg  Vital Signs: Temp: 97.7 F (36.5 C) (03/10 0735) Temp Source: Oral (03/10 0735) BP: 132/100 (03/10 0735) Pulse Rate: 84 (03/10 0615)  Labs: Recent Labs    03/30/23 0212 03/31/23 0321 03/31/23 1423 04/01/23 0210  HGB 12.9* 13.1  --  13.4  HCT 38.8* 39.9  --  40.0  PLT 270 294  --  293  APTT 77* 130*  --  111*  LABPROT 15.8* 19.1*  --  22.2*  INR 1.2 1.6*  --  1.9*  HEPARINUNFRC 0.55 0.75* 0.50 0.45  CREATININE 1.46* 1.31*  --  1.42*    Estimated Creatinine Clearance: 83.5 mL/min (A) (by C-G formula based on SCr of 1.42 mg/dL (H)).   Medical History: Past Medical History:  Diagnosis Date   A-fib [facility])    DCCV 2009, 2012, 2014, Nov 2016   Anxiety    Arthritis    Knees, wrist, ankles   Cancer (HCC) 07/2019   Kidney   Colon cancer (HCC)    Diabetes mellitus without complication (HCC)    Dysrhythmia 1996   A- Fib   GERD (gastroesophageal reflux disease)    History of kidney stones    HX: anticoagulation    very remotely and briefly on COumadin aound one of his CV, 2014 on Eliquis had hematuria with renal calculi and stopped   Hyperlipidemia    patient denies this   Hypertension    IBS (irritable bowel syndrome)    Normal cardiac stress test    Normal nuclear stress test , 2001   Presence of Watchman left atrial appendage closure device 02/21/2023   31mm Watchman FLX Pro device placed during concomitant AF  ablation/LAAO with Dr. Jimmey Ralph.   S/P ablation of atrial fibrillation 02/21/2023   With Dr. Jimmey Ralph    Medications:  Medications Prior to Admission  Medication Sig Dispense Refill Last Dose/Taking   acetaminophen (TYLENOL) 500 MG tablet Take 1,500 mg by mouth daily as needed for moderate pain (pain score 4-6), fever or headache.   Past Week   amLODipine (NORVASC) 5 MG tablet Take 1 tablet (5 mg total) by mouth daily. (Patient taking differently: Take 5 mg by mouth at bedtime.) 90 tablet 2 03/24/2023 Bedtime   apixaban (ELIQUIS) 5 MG TABS tablet Take 1 tablet (5 mg total) by mouth 2 (two) times daily. 60 tablet 3 03/25/2023 at  7:00 AM   atorvastatin (LIPITOR) 20 MG tablet Take 1 tablet (20 mg total) by mouth at bedtime. (Patient taking differently: Take 20 mg by mouth daily.) 90 tablet 1 03/25/2023 Morning   Cholecalciferol (VITAMIN D-3 PO) Take 1 capsule by mouth daily.   03/25/2023 Morning   DULoxetine (CYMBALTA) 60 MG capsule Take 60 mg by mouth in the morning.   03/25/2023 Morning   MAGNESIUM PO Take 1 tablet by mouth at bedtime.   03/24/2023 Bedtime   metoprolol tartrate (LOPRESSOR) 100 MG tablet TAKE ONE TABLET BY MOUTH TWICE DAILY 180  tablet 1 03/25/2023 Morning   Multiple Vitamins-Minerals (ZINC PO) Take 1 tablet by mouth daily.   03/25/2023 Morning   OVER THE COUNTER MEDICATION Take 1 capsule by mouth 2 (two) times daily. OTC supplement "Glucocil"   03/25/2023 Morning    Assessment: 62 YOM s/p TEE with noted left atrial clot w/ clot coming out of the left atrial appendage closure device/watchman device. This is considered an apixaban failure. Last dose of apixaban given 3/5 at 08:21. He was on a hep gtt prior to apixaban dose given this morning. Restarted at last known rate He has a history of using warfarin from February 2023. Last known dose was 10 mg on Monday, Wednesday, Friday, and Saturday. 7.5 mg on Sunday, Tuesday, and Thursday. This regimen resulted in an INR ~2.4  Heparin level therapeutic  0.45 on 2200 units/hr. No bleeding noted CBC stable  INR rising to 1.9 today.   Goal of Therapy:  INR 2-3 Heparin level 0.3-0.7 units/ml aPTT 66-102 seconds Monitor platelets by anticoagulation protocol: Yes   Plan:  Continue IV heparin 2200 units/hr Warfarin 10 mg x1  Heparin level, INR, and CBC daily.  May DC aPTT once correlates with heparin level  Reece Leader, Colon Flattery, [facility] Clinical Pharmacist  04/01/2023 8:09 AM   [facility] pharmacy phone numbers are listed on [email or web address]

## 2023-04-02 LAB — LUPUS ANTICOAGULANT PANEL
DRVVT: 48.6 s — ABNORMAL HIGH (ref 0.0–47.0)
PTT Lupus Anticoagulant: 39.4 s (ref 0.0–43.5)

## 2023-04-02 LAB — DRVVT MIX: dRVVT Mix: 42.5 s — ABNORMAL HIGH (ref 0.0–40.4)

## 2023-04-02 LAB — FACTOR 5 LEIDEN

## 2023-04-02 LAB — PROTEIN S, TOTAL: Protein S Ag, Total: 149 % (ref 60–150)

## 2023-04-02 LAB — DRVVT CONFIRM: dRVVT Confirm: 1 ratio (ref 0.8–1.2)

## 2023-04-02 LAB — PROTEIN S ACTIVITY: Protein S Activity: 111 % (ref 63–140)

## 2023-04-02 LAB — PROTEIN C ACTIVITY: Protein C Activity: 133 % (ref 73–180)

## 2023-04-03 ENCOUNTER — Ambulatory Visit: Attending: Cardiology | Admitting: *Deleted

## 2023-04-03 DIAGNOSIS — Z5181 Encounter for therapeutic drug level monitoring: Secondary | ICD-10-CM | POA: Diagnosis not present

## 2023-04-03 DIAGNOSIS — I4891 Unspecified atrial fibrillation: Secondary | ICD-10-CM

## 2023-04-03 DIAGNOSIS — I513 Intracardiac thrombosis, not elsewhere classified: Secondary | ICD-10-CM | POA: Diagnosis not present

## 2023-04-03 LAB — POCT INR: INR: 4.1 — AB (ref 2.0–3.0)

## 2023-04-03 LAB — PROTHROMBIN GENE MUTATION

## 2023-04-03 NOTE — Patient Instructions (Signed)
 Hold warfarin tonight then decrease dose to 2 tablets daily except 1 1/2 tablets on Tuesdays, Thursdays and Saturdays Recheck in 1 week

## 2023-04-04 ENCOUNTER — Ambulatory Visit

## 2023-04-08 ENCOUNTER — Ambulatory Visit: Attending: Cardiology | Admitting: *Deleted

## 2023-04-08 DIAGNOSIS — I513 Intracardiac thrombosis, not elsewhere classified: Secondary | ICD-10-CM

## 2023-04-08 DIAGNOSIS — Z5181 Encounter for therapeutic drug level monitoring: Secondary | ICD-10-CM | POA: Diagnosis not present

## 2023-04-08 DIAGNOSIS — I4891 Unspecified atrial fibrillation: Secondary | ICD-10-CM | POA: Diagnosis not present

## 2023-04-08 LAB — POCT INR: INR: 3.7 — AB (ref 2.0–3.0)

## 2023-04-08 NOTE — Patient Instructions (Signed)
 Hold warfarin tonight then decrease dose to 1 1/2 tablets daily except 2 tablets on Sundays Recheck in 1 week

## 2023-04-10 ENCOUNTER — Other Ambulatory Visit: Payer: Self-pay

## 2023-04-10 DIAGNOSIS — D6869 Other thrombophilia: Secondary | ICD-10-CM

## 2023-04-10 DIAGNOSIS — D509 Iron deficiency anemia, unspecified: Secondary | ICD-10-CM

## 2023-04-10 NOTE — Progress Notes (Signed)
 Bradley Putnam, MD  Frutoso Schatz, RN Can we refer to Hematology for abnormal labs from hypercoagulable workup?

## 2023-04-12 ENCOUNTER — Encounter: Payer: Self-pay | Admitting: *Deleted

## 2023-04-12 ENCOUNTER — Ambulatory Visit: Attending: Cardiovascular Disease | Admitting: Pulmonary Disease

## 2023-04-12 ENCOUNTER — Ambulatory Visit (HOSPITAL_COMMUNITY): Admitting: Internal Medicine

## 2023-04-12 ENCOUNTER — Encounter: Payer: Self-pay | Admitting: Pulmonary Disease

## 2023-04-12 VITALS — BP 128/68 | HR 95 | Ht >= 80 in | Wt 316.8 lb

## 2023-04-12 DIAGNOSIS — D6869 Other thrombophilia: Secondary | ICD-10-CM

## 2023-04-12 DIAGNOSIS — Z95818 Presence of other cardiac implants and grafts: Secondary | ICD-10-CM

## 2023-04-12 DIAGNOSIS — I4819 Other persistent atrial fibrillation: Secondary | ICD-10-CM | POA: Diagnosis not present

## 2023-04-12 DIAGNOSIS — Z0279 Encounter for issue of other medical certificate: Secondary | ICD-10-CM

## 2023-04-12 DIAGNOSIS — I513 Intracardiac thrombosis, not elsewhere classified: Secondary | ICD-10-CM | POA: Diagnosis not present

## 2023-04-12 MED ORDER — FLECAINIDE ACETATE 100 MG PO TABS: 100.0000 mg | ORAL_TABLET | Freq: Two times a day (BID) | ORAL | 0 refills | Status: DC

## 2023-04-12 NOTE — H&P (View-Only) (Signed)
 Electrophysiology Office Note:   Date:  04/12/2023  ID:  Bradley Rodriguez, DOB [birth year], MRN [id number]  Primary Cardiologist: Dina Rich, MD Primary Heart Failure: None Electrophysiologist: Nobie Putnam, MD      History of Present Illness:   Bradley Rodriguez is a 62 y.o. male with h/o AF s/p ablation & LAA closure / Watchman, renal/colon cancer seen today for routine electrophysiology followup.   He underwent Watchman 02/21/23 with small right groin hematoma which resolved.   Recent admit from 3/3-3/10/25 for dizziness and an "odd sensation in his chest".  He underwent LHC which showed non-obs CAD.  Patient initially planned for TEE/DCCV for AF, subsequently found to have LA thrombus (despite eliquis compliance).  He was transitioned to heparin infusion & coumadin.  Questionable TIA on admit with dizziness but pt deferred Neuro evaluation. He will remain on ASA + warfarin until repeat Watchman imaging completed. Post hospitalization INR's are 4.1 > 3.7.    Since last being seen in our clinic the patient reports he has been feeling tired.  He goes to bed tired and wakes up tired. He is following closely with the [facility] in [place]. He is due for his next check on 04/16/23.    He denies chest pain, palpitations, dyspnea, PND, orthopnea, nausea, vomiting, dizziness, syncope, edema, weight gain, or early satiety.   Review of systems complete and found to be negative unless listed in HPI.   EP Information / Studies Reviewed:    EKG is ordered today. Personal review as below.  EKG Interpretation Date/Time:  Friday April 12 2023 14:52:48 EDT Ventricular Rate:  95 PR Interval:    QRS Duration:  98 QT Interval:  346 QTC Calculation: 434 R Axis:   66  Text Interpretation: Atrial fibrillation Confirmed by Canary Brim ([id number]) on 04/12/2023 3:16:02 PM   Studies:  Cardiac CT 02/01/23 > LAA is a large chicken wing morphology w/o thrombus, small PFO, CAC 253 which is 73rd  percentile for matched controls TEE 02/21/23 > LVEF 50-55%, LA mildly dilated EPS 02/21/23 > successful PVI, ablation / isolation of the posterior wall, ICE reveals normal LV size / function, trivial pericardial effusion, successful implantation of 31 mm Watchman FLX Pro LAA Occlusive device TEE 03/26/33 > mildly depressed ejection fraction, mild to mod MV regurgitation, LA thrombus adherent to LAA occlusive device, cardioversion not performed  LHC 03/25/23 > proximal RCA lesion 20% stenosed, 2nd Mrg lesion 20% stenosed, distal LAD lesion 20% stenosed > mild non-obs CAD, LVEDP   Arrhythmia / AAD AF  AF ablation & LAA Occlusion 02/21/23  Risk Assessment/Calculations:    CHA2DS2-VASc Score = 2   This indicates a 2.2% annual risk of stroke. The patient's score is based upon: CHF History: 0 HTN History: 1 Diabetes History: 1 Stroke History: 0 Vascular Disease History: 0 Age Score: 0 Gender Score: 0             Physical Exam:   VS:  BP 128/68   Pulse 95   Ht 6\' 8"  (2.032 m)   Wt (!) 316 lb 12.8 oz (143.7 kg)   SpO2 96%   BMI 34.80 kg/m    Wt Readings from Last 3 Encounters:  04/12/23 (!) 316 lb 12.8 oz (143.7 kg)  03/26/23 (!) 309 lb 4.9 oz (140.3 kg)  03/04/23 (!) 317 lb 3.2 oz (143.9 kg)     GEN: Well nourished, well developed in no acute distress NECK: No JVD; No carotid bruits CARDIAC: Irregularly irregular rate  and rhythm, no murmurs, rubs, gallops RESPIRATORY:  Clear to auscultation without rales, wheezing or rhonchi  ABDOMEN: Soft, non-tender, non-distended EXTREMITIES:  No edema; No deformity   ASSESSMENT AND PLAN:    Persistent Atrial Fibrillation  Failed AAD, s/p catheter ablation and early recurrence, rhythm control complicated by device related thrombus  -continue Toprol 100 mb BID -cardizem CD 120 mg daily  -EKG with AF, controlled ventricular rate of 95 bpm -plan for TEE on 05/03/23 to review LA thrombus, if no thrombus he will have DCCV.   -IF  cardioverted, post cardioversion he should start flecainide 100 mg BID and we have planned to see him in clinic in 1 week to assess EKG and increase dose to 150mg  BID.  Patient and wife understand not to start the flecainide unless he is cardioverted  Watchman Device in Situ  Device Related Thrombus  TEE showed well sealed Watchman device with no leak, however device related thrombus seen on surface of device -continue Coumadin per [facility]  -INR Goal 2.5-3.0 given thrombus on Eliquis  -hx malignancy, CT abd/pelvis unremarkable for recurrence of malignancy   Secondary Hypercoagulable State  -continue Coumadin -Goal INR 2.5-3.0  -most recent INR 3/17 3.7  Hx Colon / Renal Cancer  -follows with ONC   Plan of care discussed with Dr. Jimmey Ralph in detail.   Follow up with Dr. Jimmey Ralph or EP APP as planned post cardioversion.   Signed, Canary Brim, NP-C, AGACNP-BC [facility] - Electrophysiology  04/12/2023, 4:05 PM

## 2023-04-12 NOTE — Patient Instructions (Addendum)
 You will need to get labs drawn on April 8th for the upcoming test > BMP, CBC, INR.    You are scheduled for TEE to look for residual clot on the Watchman device. If no clot, they will move forward with cardioversion. IF you are cardioverted, you should begin Flecainide 100 mg once in the am and once in the evening.    We will plan to see you on 4/18 for EKG and then expect to increase your flecainide dose if your EKG is stable.     Medication Instructions:  We have sent in flecainide 100 mg for you to take twice daily. Do not have this filled until April 8th or 9th. *If you need a refill on your cardiac medications before your next appointment, please call your pharmacy*  Lab Work: BMET, CBC-04/30/23, you will also need an INR within 3 days of cardioversion this should be coordinated by the coumadin clinic. You will need to go to any [facility] for these labs. There is a [facility] in our building on the 1st floor or you can call [phone] or visit [email or web address] to find a lab near you. - You do NOT need an appointment for this. You do NOT need to be fasting. We NO LONGER have a lab located in our office.  If you have labs (blood work) drawn today and your tests are completely normal, you will receive your results only by: MyChart Message (if you have MyChart) OR A paper copy in the mail If you have any lab test that is abnormal or we need to change your treatment, we will call you to review the results.  Testing/Procedures: Your physician has recommended that you have a Cardioversion (DCCV) as previously scheduled. Electrical Cardioversion uses a jolt of electricity to your heart either through paddles or wired patches attached to your chest. This is a controlled, usually prescheduled, procedure. Defibrillation is done under light anesthesia in the hospital, and you usually go home the day of the procedure. This is done to get your heart back into a normal rhythm. You are not awake for the procedure.  Please see the instruction sheet given to you today.   Follow-Up: At [facility], you and your health needs are our priority.  As part of our continuing mission to provide you with exceptional heart care, we have created designated Provider Care Teams.  These Care Teams include your primary Cardiologist (physician) and Advanced Practice Providers (APPs -  Physician Assistants and Nurse Practitioners) who all work together to provide you with the care you need, when you need it.  Your next appointment:   05/10/23 at 8:00 AM  Provider:   Canary Brim, NP

## 2023-04-12 NOTE — Progress Notes (Signed)
 Electrophysiology Office Note:   Date:  04/12/2023  ID:  Bradley Rodriguez, DOB [birth year], MRN [id number]  Primary Cardiologist: Dina Rich, MD Primary Heart Failure: None Electrophysiologist: Nobie Putnam, MD      History of Present Illness:   Bradley Rodriguez is a 62 y.o. male with h/o AF s/p ablation & LAA closure / Watchman, renal/colon cancer seen today for routine electrophysiology followup.   He underwent Watchman 02/21/23 with small right groin hematoma which resolved.   Recent admit from 3/3-3/10/25 for dizziness and an "odd sensation in his chest".  He underwent LHC which showed non-obs CAD.  Patient initially planned for TEE/DCCV for AF, subsequently found to have LA thrombus (despite eliquis compliance).  He was transitioned to heparin infusion & coumadin.  Questionable TIA on admit with dizziness but pt deferred Neuro evaluation. He will remain on ASA + warfarin until repeat Watchman imaging completed. Post hospitalization INR's are 4.1 > 3.7.    Since last being seen in our clinic the patient reports he has been feeling tired.  He goes to bed tired and wakes up tired. He is following closely with the Coumadin Clinic in [place]. He is due for his next check on 04/16/23.    He denies chest pain, palpitations, dyspnea, PND, orthopnea, nausea, vomiting, dizziness, syncope, edema, weight gain, or early satiety.   Review of systems complete and found to be negative unless listed in HPI.   EP Information / Studies Reviewed:    EKG is ordered today. Personal review as below.  EKG Interpretation Date/Time:  Friday April 12 2023 14:52:48 EDT Ventricular Rate:  95 PR Interval:    QRS Duration:  98 QT Interval:  346 QTC Calculation: 434 R Axis:   66  Text Interpretation: Atrial fibrillation Confirmed by Canary Brim ([id number]) on 04/12/2023 3:16:02 PM   Studies:  Cardiac CT 02/01/23 > LAA is a large chicken wing morphology w/o thrombus, small PFO, CAC 253 which is 73rd  percentile for matched controls TEE 02/21/23 > LVEF 50-55%, LA mildly dilated EPS 02/21/23 > successful PVI, ablation / isolation of the posterior wall, ICE reveals normal LV size / function, trivial pericardial effusion, successful implantation of 31 mm Watchman FLX Pro LAA Occlusive device TEE 03/26/33 > mildly depressed ejection fraction, mild to mod MV regurgitation, LA thrombus adherent to LAA occlusive device, cardioversion not performed  LHC 03/25/23 > proximal RCA lesion 20% stenosed, 2nd Mrg lesion 20% stenosed, distal LAD lesion 20% stenosed > mild non-obs CAD, LVEDP   Arrhythmia / AAD AF  AF ablation & LAA Occlusion 02/21/23  Risk Assessment/Calculations:    CHA2DS2-VASc Score = 2   This indicates a 2.2% annual risk of stroke. The patient's score is based upon: CHF History: 0 HTN History: 1 Diabetes History: 1 Stroke History: 0 Vascular Disease History: 0 Age Score: 0 Gender Score: 0             Physical Exam:   VS:  BP 128/68   Pulse 95   Ht 6\' 8"  (2.032 m)   Wt (!) 316 lb 12.8 oz (143.7 kg)   SpO2 96%   BMI 34.80 kg/m    Wt Readings from Last 3 Encounters:  04/12/23 (!) 316 lb 12.8 oz (143.7 kg)  03/26/23 (!) 309 lb 4.9 oz (140.3 kg)  03/04/23 (!) 317 lb 3.2 oz (143.9 kg)     GEN: Well nourished, well developed in no acute distress NECK: No JVD; No carotid bruits CARDIAC: Irregularly irregular rate  and rhythm, no murmurs, rubs, gallops RESPIRATORY:  Clear to auscultation without rales, wheezing or rhonchi  ABDOMEN: Soft, non-tender, non-distended EXTREMITIES:  No edema; No deformity   ASSESSMENT AND PLAN:    Persistent Atrial Fibrillation  Failed AAD, s/p catheter ablation and early recurrence, rhythm control complicated by device related thrombus  -continue Toprol 100 mb BID -cardizem CD 120 mg daily  -EKG with AF, controlled ventricular rate of 95 bpm -plan for TEE on 05/03/23 to review LA thrombus, if no thrombus he will have DCCV.   -IF  cardioverted, post cardioversion he should start flecainide 100 mg BID and we have planned to see him in clinic in 1 week to assess EKG and increase dose to 150mg  BID.  Patient and wife understand not to start the flecainide unless he is cardioverted  Watchman Device in Situ  Device Related Thrombus  TEE showed well sealed Watchman device with no leak, however device related thrombus seen on surface of device -continue Coumadin per [facility]  -INR Goal 2.5-3.0 given thrombus on Eliquis  -hx malignancy, CT abd/pelvis unremarkable for recurrence of malignancy   Secondary Hypercoagulable State  -continue Coumadin -Goal INR 2.5-3.0  -most recent INR 3/17 3.7  Hx Colon / Renal Cancer  -follows with ONC   Plan of care discussed with Dr. Jimmey Ralph in detail.   Follow up with Dr. Jimmey Ralph or EP APP as planned post cardioversion.   Signed, Canary Brim, NP-C, AGACNP-BC [facility] - Electrophysiology  04/12/2023, 4:05 PM

## 2023-04-15 ENCOUNTER — Telehealth: Payer: Self-pay | Admitting: Cardiology

## 2023-04-15 NOTE — Progress Notes (Signed)
 New note for coding query:   Non-ST Elevated Myocardial Infarction (NSTEMI) type 2 due to demand ischemia   Bradley Putnam, MD

## 2023-04-15 NOTE — Telephone Encounter (Signed)
 Received [employer] form for the patient's spouse, Rosey Bath.  Patient signed ROI and paid $29 forms fee.  Form placed in Dr. Lavone Neri box.

## 2023-04-16 ENCOUNTER — Ambulatory Visit: Attending: Cardiology | Admitting: *Deleted

## 2023-04-16 DIAGNOSIS — I4891 Unspecified atrial fibrillation: Secondary | ICD-10-CM | POA: Diagnosis not present

## 2023-04-16 DIAGNOSIS — Z5181 Encounter for therapeutic drug level monitoring: Secondary | ICD-10-CM | POA: Diagnosis not present

## 2023-04-16 DIAGNOSIS — I513 Intracardiac thrombosis, not elsewhere classified: Secondary | ICD-10-CM | POA: Diagnosis not present

## 2023-04-16 LAB — POCT INR: INR: 3.2 — AB (ref 2.0–3.0)

## 2023-04-16 NOTE — Patient Instructions (Signed)
 Missed 1/2 tablet on Sunday.  Has taken 1 1/2 tablets daily x 1 wk Decrease warfarin to 1 1/2 tablets daily except 1 tablet on Tuesday Recheck in 1 week

## 2023-04-16 NOTE — Telephone Encounter (Signed)
 Forms have been completed and signed and given back to front office staff.

## 2023-04-18 ENCOUNTER — Other Ambulatory Visit: Payer: Self-pay | Admitting: Cardiology

## 2023-04-18 NOTE — Telephone Encounter (Signed)
 Forms faxed to [facility] and scanned into chart.

## 2023-04-23 ENCOUNTER — Other Ambulatory Visit (HOSPITAL_COMMUNITY): Payer: Medicare HMO

## 2023-04-23 ENCOUNTER — Ambulatory Visit: Attending: Cardiology | Admitting: *Deleted

## 2023-04-23 DIAGNOSIS — I513 Intracardiac thrombosis, not elsewhere classified: Secondary | ICD-10-CM | POA: Diagnosis not present

## 2023-04-23 DIAGNOSIS — Z5181 Encounter for therapeutic drug level monitoring: Secondary | ICD-10-CM | POA: Diagnosis not present

## 2023-04-23 DIAGNOSIS — I4891 Unspecified atrial fibrillation: Secondary | ICD-10-CM | POA: Diagnosis not present

## 2023-04-23 LAB — POCT INR: INR: 2.2 (ref 2.0–3.0)

## 2023-04-23 NOTE — Patient Instructions (Signed)
 Increase warfarin to 1 1/2 tablets daily  Recheck in 1 week

## 2023-04-29 ENCOUNTER — Ambulatory Visit: Attending: Cardiology | Admitting: *Deleted

## 2023-04-29 DIAGNOSIS — Z5181 Encounter for therapeutic drug level monitoring: Secondary | ICD-10-CM | POA: Diagnosis not present

## 2023-04-29 DIAGNOSIS — I4891 Unspecified atrial fibrillation: Secondary | ICD-10-CM | POA: Diagnosis not present

## 2023-04-29 DIAGNOSIS — I513 Intracardiac thrombosis, not elsewhere classified: Secondary | ICD-10-CM

## 2023-04-29 LAB — POCT INR: INR: 2.8 (ref 2.0–3.0)

## 2023-04-29 NOTE — Patient Instructions (Signed)
 Continue warfarin 1 1/2 tablets daily  TEE/DCCV on 4/11 Recheck in 1 week

## 2023-04-30 ENCOUNTER — Other Ambulatory Visit: Payer: Self-pay

## 2023-04-30 DIAGNOSIS — Z01810 Encounter for preprocedural cardiovascular examination: Secondary | ICD-10-CM | POA: Diagnosis not present

## 2023-04-30 DIAGNOSIS — I4819 Other persistent atrial fibrillation: Secondary | ICD-10-CM | POA: Diagnosis not present

## 2023-04-30 DIAGNOSIS — D6869 Other thrombophilia: Secondary | ICD-10-CM | POA: Diagnosis not present

## 2023-04-30 DIAGNOSIS — Z95818 Presence of other cardiac implants and grafts: Secondary | ICD-10-CM

## 2023-04-30 DIAGNOSIS — I513 Intracardiac thrombosis, not elsewhere classified: Secondary | ICD-10-CM | POA: Diagnosis not present

## 2023-04-30 LAB — CBC
Hematocrit: 38.7 % (ref 37.5–51.0)
Hemoglobin: 13.1 g/dL (ref 13.0–17.7)
MCH: 30.6 pg (ref 26.6–33.0)
MCHC: 33.9 g/dL (ref 31.5–35.7)
MCV: 90 fL (ref 79–97)
Platelets: 304 10*3/uL (ref 150–450)
RBC: 4.28 x10E6/uL (ref 4.14–5.80)
RDW: 14.5 % (ref 11.6–15.4)
WBC: 5 10*3/uL (ref 3.4–10.8)

## 2023-05-01 LAB — BASIC METABOLIC PANEL WITH GFR
BUN/Creatinine Ratio: 15 (ref 10–24)
BUN: 17 mg/dL (ref 8–27)
CO2: 21 mmol/L (ref 20–29)
Calcium: 9.4 mg/dL (ref 8.6–10.2)
Chloride: 101 mmol/L (ref 96–106)
Creatinine, Ser: 1.16 mg/dL (ref 0.76–1.27)
Glucose: 148 mg/dL — ABNORMAL HIGH (ref 70–99)
Potassium: 4.9 mmol/L (ref 3.5–5.2)
Sodium: 142 mmol/L (ref 134–144)
eGFR: 70 mL/min/{1.73_m2} (ref >59)

## 2023-05-02 NOTE — Progress Notes (Signed)
 Spoke to patient and instructed them to come at 0730  and to be NPO after 0000.  Medications reviewed.    Confirmed that patient will have a ride home and someone to stay with them for 24 hours after the procedure.

## 2023-05-02 NOTE — Anesthesia Preprocedure Evaluation (Addendum)
 Anesthesia Evaluation  Patient identified by MRN, date of birth, ID band Patient awake    Reviewed: Allergy & Precautions, NPO status , Patient's Chart, lab work & pertinent test results  History of Anesthesia Complications Negative for: history of anesthetic complications  Airway Mallampati: III  TM Distance: >3 FB Neck ROM: Full   Comment: Previous grade I view with glidescope 4, 2-handed mask with OPA Dental  (+)    Pulmonary neg pulmonary ROS   Pulmonary exam normal breath sounds clear to auscultation       Cardiovascular hypertension (amlodipine, metoprolol), Pt. on medications (-) angina (-) Past MI, (-) Cardiac Stents and (-) CABG + dysrhythmias (s/p previous ablation, Watchman) Atrial Fibrillation + Valvular Problems/Murmurs (mild-to-moderate MR/TR)  Rhythm:Regular Rate:Normal  HLD  TEE 03/27/2023: IMPRESSIONS    1. Left ventricular ejection fraction, by estimation, is 45 to 50%. The  left ventricle has mildly decreased function. The left ventricular  internal cavity size was mildly dilated. Left ventricular diastolic  function could not be evaluated.   2. Right ventricular systolic function is normal. The right ventricular  size is normal.   3. There is a left atrial appendage closure device ( Watchman device).  The watchman device is well seated. There are two seperate clots: the  first is the left atrium measuring 1.16 cm x 0.76 cm and the second  appears to be attached to the watchman  device measuring 2.24 cm x 0.831 cm. Left atrial size was mildly dilated.  A left atrial/left atrial appendage thrombus was detected.   4. The mitral valve is abnormal. Mild to moderate mitral valve  regurgitation. No evidence of mitral stenosis.   5. Tricuspid valve regurgitation is mild to moderate.   6. The aortic valve is normal in structure. Aortic valve regurgitation is  not visualized. No aortic stenosis is present.      Neuro/Psych  PSYCHIATRIC DISORDERS Anxiety     negative neurological ROS     GI/Hepatic Neg liver ROS,GERD  ,,H/o colon cancer s/p hemicolectomy   Endo/Other  diabetes, Type 2    Renal/GU Renal disease (h/o left RCC s/p nephrectomy)     Musculoskeletal  (+) Arthritis ,    Abdominal  (+) + obese  Peds  Hematology  (+) Blood dyscrasia Lab Results      Component                Value               Date                      WBC                      5.0                 04/30/2023                HGB                      13.1                04/30/2023                HCT                      38.7                04/30/2023  MCV                      90                  04/30/2023                PLT                      304                 04/30/2023              Anesthesia Other Findings Last warfarin: yesterday afternoon  Reproductive/Obstetrics                             Anesthesia Physical Anesthesia Plan  ASA: 3  Anesthesia Plan: MAC   Post-op Pain Management:    Induction: Intravenous  PONV Risk Score and Plan: 1 and Propofol infusion, TIVA and Treatment may vary due to age or medical condition  Airway Management Planned: Natural Airway and Nasal Cannula  Additional Equipment:   Intra-op Plan:   Post-operative Plan:   Informed Consent: I have reviewed the patients History and Physical, chart, labs and discussed the procedure including the risks, benefits and alternatives for the proposed anesthesia with the patient or authorized representative who has indicated his/her understanding and acceptance.     Dental advisory given  Plan Discussed with: CRNA and Anesthesiologist  Anesthesia Plan Comments: (Discussed with patient risks of MAC including, but not limited to, minor pain or discomfort, hearing people in the room, and possible need for backup general anesthesia. Risks for general anesthesia also discussed including,  but not limited to, sore throat, hoarse voice, chipped/damaged teeth, injury to vocal cords, nausea and vomiting, allergic reactions, lung infection, heart attack, stroke, and death. All questions answered. )       Anesthesia Quick Evaluation

## 2023-05-03 ENCOUNTER — Ambulatory Visit (HOSPITAL_BASED_OUTPATIENT_CLINIC_OR_DEPARTMENT_OTHER): Payer: Self-pay | Admitting: Anesthesiology

## 2023-05-03 ENCOUNTER — Encounter (HOSPITAL_COMMUNITY)
Admission: RE | Disposition: A | Discharge status: Home / Self Care | Payer: Self-pay | Source: Home / Self Care | Attending: Cardiovascular Disease

## 2023-05-03 ENCOUNTER — Other Ambulatory Visit: Payer: Self-pay

## 2023-05-03 ENCOUNTER — Ambulatory Visit (HOSPITAL_COMMUNITY)
Admission: RE | Admit: 2023-05-03 | Discharge: 2023-05-03 | Disposition: A | Discharge status: Home / Self Care | Attending: Cardiovascular Disease | Admitting: Cardiovascular Disease

## 2023-05-03 ENCOUNTER — Ambulatory Visit (HOSPITAL_COMMUNITY): Payer: Self-pay | Admitting: Anesthesiology

## 2023-05-03 ENCOUNTER — Ambulatory Visit (HOSPITAL_COMMUNITY)
Admission: RE | Admit: 2023-05-03 | Discharge: 2023-05-03 | Disposition: A | Discharge status: Home / Self Care | Source: Ambulatory Visit | Attending: Cardiovascular Disease | Admitting: Cardiovascular Disease

## 2023-05-03 DIAGNOSIS — Z95818 Presence of other cardiac implants and grafts: Secondary | ICD-10-CM | POA: Diagnosis not present

## 2023-05-03 DIAGNOSIS — Z79899 Other long term (current) drug therapy: Secondary | ICD-10-CM | POA: Insufficient documentation

## 2023-05-03 DIAGNOSIS — Z85528 Personal history of other malignant neoplasm of kidney: Secondary | ICD-10-CM | POA: Diagnosis not present

## 2023-05-03 DIAGNOSIS — D6869 Other thrombophilia: Secondary | ICD-10-CM | POA: Diagnosis not present

## 2023-05-03 DIAGNOSIS — I340 Nonrheumatic mitral (valve) insufficiency: Secondary | ICD-10-CM

## 2023-05-03 DIAGNOSIS — E785 Hyperlipidemia, unspecified: Secondary | ICD-10-CM | POA: Diagnosis not present

## 2023-05-03 DIAGNOSIS — I10 Essential (primary) hypertension: Secondary | ICD-10-CM | POA: Diagnosis not present

## 2023-05-03 DIAGNOSIS — I361 Nonrheumatic tricuspid (valve) insufficiency: Secondary | ICD-10-CM | POA: Diagnosis not present

## 2023-05-03 DIAGNOSIS — I4891 Unspecified atrial fibrillation: Secondary | ICD-10-CM

## 2023-05-03 DIAGNOSIS — F419 Anxiety disorder, unspecified: Secondary | ICD-10-CM

## 2023-05-03 DIAGNOSIS — I513 Intracardiac thrombosis, not elsewhere classified: Secondary | ICD-10-CM | POA: Diagnosis present

## 2023-05-03 DIAGNOSIS — Z85038 Personal history of other malignant neoplasm of large intestine: Secondary | ICD-10-CM | POA: Insufficient documentation

## 2023-05-03 DIAGNOSIS — I4819 Other persistent atrial fibrillation: Secondary | ICD-10-CM | POA: Insufficient documentation

## 2023-05-03 DIAGNOSIS — Z7901 Long term (current) use of anticoagulants: Secondary | ICD-10-CM | POA: Insufficient documentation

## 2023-05-03 DIAGNOSIS — F32A Depression, unspecified: Secondary | ICD-10-CM | POA: Diagnosis not present

## 2023-05-03 DIAGNOSIS — I083 Combined rheumatic disorders of mitral, aortic and tricuspid valves: Secondary | ICD-10-CM | POA: Diagnosis not present

## 2023-05-03 HISTORY — PX: TRANSESOPHAGEAL ECHOCARDIOGRAM (CATH LAB): EP1270

## 2023-05-03 LAB — GLUCOSE, CAPILLARY: Glucose-Capillary: 155 mg/dL — ABNORMAL HIGH (ref 70–99)

## 2023-05-03 LAB — PROTIME-INR
INR: 2.3 — ABNORMAL HIGH (ref 0.8–1.2)
Prothrombin Time: 25.5 s — ABNORMAL HIGH (ref 11.4–15.2)

## 2023-05-03 LAB — ECHO TEE

## 2023-05-03 SURGERY — TRANSESOPHAGEAL ECHOCARDIOGRAM (TEE) (CATHLAB)
Anesthesia: Monitor Anesthesia Care

## 2023-05-03 MED ORDER — SODIUM CHLORIDE 0.9 % IV SOLN: INTRAVENOUS | Status: DC

## 2023-05-03 MED ORDER — PROPOFOL 10 MG/ML IV BOLUS
INTRAVENOUS | Status: DC | PRN
  Administered 2023-05-03: 100 mg via INTRAVENOUS

## 2023-05-03 MED ORDER — PROPOFOL 500 MG/50ML IV EMUL
INTRAVENOUS | Status: DC | PRN
  Administered 2023-05-03: 100 ug/kg/min via INTRAVENOUS

## 2023-05-03 MED ORDER — ONDANSETRON HCL 4 MG/2ML IJ SOLN
INTRAMUSCULAR | Status: DC | PRN
  Administered 2023-05-03: 4 mg via INTRAVENOUS

## 2023-05-03 MED ORDER — LIDOCAINE 2% (20 MG/ML) 5 ML SYRINGE
INTRAMUSCULAR | Status: DC | PRN
  Administered 2023-05-03: 60 mg via INTRAVENOUS

## 2023-05-03 SURGICAL SUPPLY — 1 items: PAD DEFIB RADIO PHYSIO CONN (PAD) ×2 IMPLANT

## 2023-05-03 NOTE — Op Note (Signed)
 INDICATIONS: Left atrial thrombus  PROCEDURE:   Informed consent was obtained prior to the procedure. The risks, benefits and alternatives for the procedure were discussed and the patient comprehended these risks.  Risks include, but are not limited to, cough, sore throat, vomiting, nausea, somnolence, esophageal and stomach trauma or perforation, bleeding, low blood pressure, aspiration, pneumonia, infection, trauma to the teeth and death.    After a procedural time-out, the oropharynx was anesthetized with 20% benzocaine spray.   During this procedure the patient was administered IV propofol by Anesthesiology, Dr. Freida Busman  The transesophageal probe was inserted in the esophagus and stomach without difficulty and multiple views were obtained.  The patient was kept under observation until the patient left the procedure room.  The patient left the procedure room in stable condition.   Agitated microbubble saline contrast was not administered.  COMPLICATIONS:    There were no immediate complications.  FINDINGS:  Although it is much smaller and no longer mobile, there is still a thrombus on the surface of the Watchman device. LVEF 45%. Severely dilated left atrium. Moderate MR.  RECOMMENDATIONS:     Continue anticoagulation for a few more weeks.  Time Spent Directly with the Patient:  30 minutes   Bradley Rodriguez 05/03/2023, 8:50 AM

## 2023-05-03 NOTE — Anesthesia Postprocedure Evaluation (Signed)
 Anesthesia Post Note  Patient: Bradley Rodriguez  Procedure(s) Performed: TRANSESOPHAGEAL ECHOCARDIOGRAM     Patient location during evaluation: PACU Anesthesia Type: MAC Level of consciousness: awake Pain management: pain level controlled Vital Signs Assessment: post-procedure vital signs reviewed and stable Respiratory status: spontaneous breathing, nonlabored ventilation and respiratory function stable Cardiovascular status: stable and blood pressure returned to baseline Postop Assessment: no apparent nausea or vomiting Anesthetic complications: no   No notable events documented.  Last Vitals:  Vitals:   05/03/23 0800 05/03/23 0813  BP: (!) 133/106 (!) 129/90  Pulse: (!) 104 80  Resp: 19 (!) 22  Temp:    SpO2: 97% 96%    Last Pain:  Vitals:   05/03/23 0744  TempSrc: Temporal                 Linton Rump

## 2023-05-03 NOTE — Interval H&P Note (Signed)
 History and Physical Interval Note:  05/03/2023 7:54 AM  Bradley Rodriguez  has presented today for surgery, with the diagnosis of afib.  The various methods of treatment have been discussed with the patient and family. After consideration of risks, benefits and other options for treatment, the patient has consented to  Procedure(s): TRANSESOPHAGEAL ECHOCARDIOGRAM (N/A) CARDIOVERSION (N/A) as a surgical intervention.  The patient's history has been reviewed, patient examined, no change in status, stable for surgery.  I have reviewed the patient's chart and labs.  Questions were answered to the patient's satisfaction.     Kenita Bines

## 2023-05-03 NOTE — Transfer of Care (Signed)
 Immediate Anesthesia Transfer of Care Note  Patient: Bradley Rodriguez  Procedure(s) Performed: TRANSESOPHAGEAL ECHOCARDIOGRAM  Patient Location: PACU  Anesthesia Type:MAC  Level of Consciousness: awake and alert   Airway & Oxygen Therapy: Patient Spontanous Breathing  Post-op Assessment: Report given to RN  Post vital signs: Reviewed and stable  Last Vitals:  Vitals Value Taken Time  BP 121/88   Temp    Pulse 102   Resp 20   SpO2 98     Last Pain:  Vitals:   05/03/23 0744  TempSrc: Temporal         Complications: No notable events documented.

## 2023-05-06 ENCOUNTER — Encounter (HOSPITAL_COMMUNITY): Payer: Self-pay | Admitting: Cardiovascular Disease

## 2023-05-06 ENCOUNTER — Encounter

## 2023-05-09 ENCOUNTER — Ambulatory Visit: Attending: Cardiology | Admitting: *Deleted

## 2023-05-09 DIAGNOSIS — I4891 Unspecified atrial fibrillation: Secondary | ICD-10-CM

## 2023-05-09 DIAGNOSIS — I513 Intracardiac thrombosis, not elsewhere classified: Secondary | ICD-10-CM

## 2023-05-09 DIAGNOSIS — Z5181 Encounter for therapeutic drug level monitoring: Secondary | ICD-10-CM | POA: Diagnosis not present

## 2023-05-09 LAB — POCT INR: INR: 3 (ref 2.0–3.0)

## 2023-05-09 NOTE — H&P (View-Only) (Signed)
 Electrophysiology Office Note:   Date:  05/10/2023  ID:  Bradley Rodriguez, DOB 06/16/1960, MRN [id number]  Primary Cardiologist: Armida Lander, MD Primary Heart Failure: None Electrophysiologist: Ardeen Kohler, MD      History of Present Illness:   Bradley Rodriguez is a 63 y.o. male with h/o  AF s/p ablation & LAA closure / Watchman, renal/colon cancer  seen today for routine electrophysiology followup.   Since last being seen in our clinic the patient reports he is doing well. He is hopeful that the clot will be completely gone at next TEE and he can have a cardioversion. He continues to feel tired with the AF.  He is tolerating the coumadin  well. He notes he hit his elbow and has a small lump/swelling on elbow.   He denies chest pain, palpitations, dyspnea, PND, orthopnea, nausea, vomiting, dizziness, syncope, edema, weight gain, or early satiety.   Review of systems complete and found to be negative unless listed in HPI.   EP Information / Studies Reviewed:    EKG is not ordered today. EKG from 04/12/23 reviewed which showed AF 95 bpm      Studies:  Cardiac CT 02/01/23 > LAA is a large chicken wing morphology w/o thrombus, small PFO, CAC 253 which is 73rd percentile for matched controls TEE 02/21/23 > LVEF 50-55%, LA mildly dilated EPS 02/21/23 > successful PVI, ablation / isolation of the posterior wall, ICE reveals normal LV size / function, trivial pericardial effusion, successful implantation of 31 mm Watchman FLX Pro LAA Occlusive device TEE 03/26/33 > mildly depressed ejection fraction, mild to mod MV regurgitation, LA thrombus adherent to LAA occlusive device, cardioversion not performed  LHC 03/25/23 > proximal RCA lesion 20% stenosed, 2nd Mrg lesion 20% stenosed, distal LAD lesion 20% stenosed > mild non-obs CAD, LVEDP 15 mmHg TEE 05/03/23 > much smaller and no longer mobile, there is still a thrombus on the surface of the Watchman device, LVEF 45%, severely dilated LA, moderate MR     Arrhythmia / AAD AF  AF ablation & LAA Occlusion 02/21/23   Risk Assessment/Calculations:    CHA2DS2-VASc Score = 2   This indicates a 2.2% annual risk of stroke. The patient's score is based upon: CHF History: 0 HTN History: 1 Diabetes History: 1 Stroke History: 0 Vascular Disease History: 0 Age Score: 0 Gender Score: 0             Physical Exam:   VS:  BP 130/78 (BP Location: Right Arm)   Pulse 60   Ht 6\' 8"  (2.032 m)   Wt (!) 323 lb (146.5 kg)   SpO2 98%   BMI 35.48 kg/m    Wt Readings from Last 3 Encounters:  05/10/23 (!) 323 lb (146.5 kg)  05/03/23 (!) 312 lb (141.5 kg)  04/12/23 (!) 316 lb 12.8 oz (143.7 kg)     GEN: Well nourished, well developed in no acute distress NECK: No JVD; No carotid bruits CARDIAC: Irregularly irregular rate and rhythm, no murmurs, rubs, gallops RESPIRATORY:  Clear to auscultation without rales, wheezing or rhonchi  ABDOMEN: Soft, non-tender, non-distended EXTREMITIES:  No edema; No deformity   ASSESSMENT AND PLAN:    Persistent Atrial Fibrillation  Failed AAD, s/p catheter ablation and early recurrence, rhythm control complicated by device related thrombus  -continue Toprol  100 mg BID, cardizem  CD 120mg  daily  -plan to continue OAC, arrange for TEE to evaluate clot and DCCV if no thrombus from 4/11 TEE. Repeat planned for 5/11, if  cardioverts, will start flecainide  at 100 mg BID and seen him in one week post TEE to increase to 150 mg BID > reiterated to patient not to start flecainide  unless he is cardioverted    Watchman Device in Situ  Device Related Thrombus  TEE showed well sealed Watchman device with no leak, however device related thrombus seen on surface of device, smaller on 05/03/23 ECHO  -Coumadin  per [facility]   -hx of malignancy, CT abd/pelvis unremarkable   Secondary Hypercoagulable State  -continue Coumadin  -INR goal 2.5-3 given hx of thrombus on eliquis   -most recent INR 3  Hx Colon / Renal  Cancer  -follows with ONC  Follow up with EP APP  on 06/11/23    Dr. Daneil Dunker visited with patient and reviewed plan of care.   Informed Consent   Shared Decision Making/Informed Consent   The risks [stroke, cardiac arrhythmias rarely resulting in the need for a temporary or permanent pacemaker, skin irritation or burns, esophageal damage, perforation (1:10,000 risk), bleeding, pharyngeal hematoma as well as other potential complications associated with conscious sedation including aspiration, arrhythmia, respiratory failure and death], benefits (treatment guidance, restoration of normal sinus rhythm, diagnostic support) and alternatives of a transesophageal echocardiogram guided cardioversion were discussed in detail with Mr. Sullo and he is willing to proceed.      Signed, Bradley Doffing, NP-C, AGACNP-BC [facility] - Electrophysiology  05/10/2023, 9:07 AM

## 2023-05-09 NOTE — Progress Notes (Signed)
 Electrophysiology Office Note:   Date:  05/10/2023  ID:  Bradley Rodriguez, DOB 18-Oct-1960, MRN [id number]  Primary Cardiologist: Armida Lander, MD Primary Heart Failure: None Electrophysiologist: Ardeen Kohler, MD      History of Present Illness:   Bradley Rodriguez is a 63 y.o. male with h/o  AF s/p ablation & LAA closure / Watchman, renal/colon cancer  seen today for routine electrophysiology followup.   Since last being seen in our clinic the patient reports he is doing well. He is hopeful that the clot will be completely gone at next TEE and he can have a cardioversion. He continues to feel tired with the AF.  He is tolerating the coumadin  well. He notes he hit his elbow and has a small lump/swelling on elbow.   He denies chest pain, palpitations, dyspnea, PND, orthopnea, nausea, vomiting, dizziness, syncope, edema, weight gain, or early satiety.   Review of systems complete and found to be negative unless listed in HPI.   EP Information / Studies Reviewed:    EKG is not ordered today. EKG from 04/12/23 reviewed which showed AF 95 bpm      Studies:  Cardiac CT 02/01/23 > LAA is a large chicken wing morphology w/o thrombus, small PFO, CAC 253 which is 73rd percentile for matched controls TEE 02/21/23 > LVEF 50-55%, LA mildly dilated EPS 02/21/23 > successful PVI, ablation / isolation of the posterior wall, ICE reveals normal LV size / function, trivial pericardial effusion, successful implantation of 31 mm Watchman FLX Pro LAA Occlusive device TEE 03/26/33 > mildly depressed ejection fraction, mild to mod MV regurgitation, LA thrombus adherent to LAA occlusive device, cardioversion not performed  LHC 03/25/23 > proximal RCA lesion 20% stenosed, 2nd Mrg lesion 20% stenosed, distal LAD lesion 20% stenosed > mild non-obs CAD, LVEDP 15 mmHg TEE 05/03/23 > much smaller and no longer mobile, there is still a thrombus on the surface of the Watchman device, LVEF 45%, severely dilated LA, moderate MR     Arrhythmia / AAD AF  AF ablation & LAA Occlusion 02/21/23   Risk Assessment/Calculations:    CHA2DS2-VASc Score = 2   This indicates a 2.2% annual risk of stroke. The patient's score is based upon: CHF History: 0 HTN History: 1 Diabetes History: 1 Stroke History: 0 Vascular Disease History: 0 Age Score: 0 Gender Score: 0             Physical Exam:   VS:  BP 130/78 (BP Location: Right Arm)   Pulse 60   Ht 6\' 8"  (2.032 m)   Wt (!) 323 lb (146.5 kg)   SpO2 98%   BMI 35.48 kg/m    Wt Readings from Last 3 Encounters:  05/10/23 (!) 323 lb (146.5 kg)  05/03/23 (!) 312 lb (141.5 kg)  04/12/23 (!) 316 lb 12.8 oz (143.7 kg)     GEN: Well nourished, well developed in no acute distress NECK: No JVD; No carotid bruits CARDIAC: Irregularly irregular rate and rhythm, no murmurs, rubs, gallops RESPIRATORY:  Clear to auscultation without rales, wheezing or rhonchi  ABDOMEN: Soft, non-tender, non-distended EXTREMITIES:  No edema; No deformity   ASSESSMENT AND PLAN:    Persistent Atrial Fibrillation  Failed AAD, s/p catheter ablation and early recurrence, rhythm control complicated by device related thrombus  -continue Toprol  100 mg BID, cardizem  CD 120mg  daily  -plan to continue OAC, arrange for TEE to evaluate clot and DCCV if no thrombus from 4/11 TEE. Repeat planned for 5/11, if  cardioverts, will start flecainide  at 100 mg BID and seen him in one week post TEE to increase to 150 mg BID > reiterated to patient not to start flecainide  unless he is cardioverted    Watchman Device in Situ  Device Related Thrombus  TEE showed well sealed Watchman device with no leak, however device related thrombus seen on surface of device, smaller on 05/03/23 ECHO  -Coumadin  per [facility]   -hx of malignancy, CT abd/pelvis unremarkable   Secondary Hypercoagulable State  -continue Coumadin  -INR goal 2.5-3 given hx of thrombus on eliquis   -most recent INR 3  Hx Colon / Renal  Cancer  -follows with ONC  Follow up with EP APP  on 06/11/23    Dr. Daneil Dunker visited with patient and reviewed plan of care.   Informed Consent   Shared Decision Making/Informed Consent   The risks [stroke, cardiac arrhythmias rarely resulting in the need for a temporary or permanent pacemaker, skin irritation or burns, esophageal damage, perforation (1:10,000 risk), bleeding, pharyngeal hematoma as well as other potential complications associated with conscious sedation including aspiration, arrhythmia, respiratory failure and death], benefits (treatment guidance, restoration of normal sinus rhythm, diagnostic support) and alternatives of a transesophageal echocardiogram guided cardioversion were discussed in detail with Mr. Longshore and he is willing to proceed.      Signed, Creighton Doffing, NP-C, AGACNP-BC [facility] - Electrophysiology  05/10/2023, 9:07 AM

## 2023-05-09 NOTE — Patient Instructions (Signed)
 Continue warfarin 1 1/2 tablets daily  TEE/DCCV on 4/11.  Thrombus is still present but much smaller Recheck in 1 week

## 2023-05-10 ENCOUNTER — Telehealth: Payer: Self-pay | Admitting: Cardiology

## 2023-05-10 ENCOUNTER — Other Ambulatory Visit: Payer: Self-pay

## 2023-05-10 ENCOUNTER — Other Ambulatory Visit (HOSPITAL_COMMUNITY): Payer: Self-pay

## 2023-05-10 ENCOUNTER — Encounter: Payer: Self-pay | Admitting: Pulmonary Disease

## 2023-05-10 ENCOUNTER — Ambulatory Visit: Attending: Cardiology | Admitting: Pulmonary Disease

## 2023-05-10 VITALS — BP 130/78 | HR 60 | Ht >= 80 in | Wt 323.0 lb

## 2023-05-10 DIAGNOSIS — I513 Intracardiac thrombosis, not elsewhere classified: Secondary | ICD-10-CM

## 2023-05-10 DIAGNOSIS — D6869 Other thrombophilia: Secondary | ICD-10-CM

## 2023-05-10 DIAGNOSIS — Z95818 Presence of other cardiac implants and grafts: Secondary | ICD-10-CM

## 2023-05-10 DIAGNOSIS — I4819 Other persistent atrial fibrillation: Secondary | ICD-10-CM

## 2023-05-10 DIAGNOSIS — Z5181 Encounter for therapeutic drug level monitoring: Secondary | ICD-10-CM

## 2023-05-10 MED ORDER — WARFARIN SODIUM 5 MG PO TABS
ORAL_TABLET | ORAL | 0 refills | Status: DC
  Filled 2023-05-10: qty 100, 50d supply, fill #0

## 2023-05-10 MED ORDER — WARFARIN SODIUM 5 MG PO TABS
ORAL_TABLET | ORAL | 1 refills | Status: DC
Start: 2023-05-10 — End: 2023-05-13

## 2023-05-10 NOTE — Addendum Note (Signed)
 Addended by: Thomasena Fleming on: 05/10/2023 04:49 PM   Modules accepted: Orders

## 2023-05-10 NOTE — Patient Instructions (Addendum)
 Medication Instructions:  Your physician recommends that you continue on your current medications as directed. Please refer to the Current Medication list given to you today.  *If you need a refill on your cardiac medications before your next appointment, please call your pharmacy*  Lab Work: TODAY: BMET, CBC If you have labs (blood work) drawn today and your tests are completely normal, you will receive your results only by: MyChart Message (if you have MyChart) OR A paper copy in the mail If you have any lab test that is abnormal or we need to change your treatment, we will call you to review the results.  Testing/Procedures:   Dear Bradley Rodriguez   You are scheduled for a TEE (Transesophageal Echocardiogram) Guided Cardioversion on Monday, May 12 at 7:30 AM with Dr. MONA.  Please arrive at the [facility] (Main Entrance A) at [facility]: [address] [place], [place] [address] at WE WILL CALL YOU WITH TIME (This time is 1 hour(s) before your procedure to ensure your preparation).   Free valet parking service is available. You will check in at ADMITTING.   *Please Note: You will receive a call the day before your procedure to confirm the appointment time. That time may have changed from the original time based on the schedule for that day.*   DIET:  Nothing to eat or drink after midnight except a sip of water  with medications (see medication instructions below)  MEDICATION INSTRUCTIONS: !!IF ANY NEW MEDICATIONS ARE STARTED AFTER TODAY, PLEASE NOTIFY YOUR PROVIDER AS SOON AS POSSIBLE!!  FYI: Medications such as Semaglutide (Ozempic, Wegovy), Tirzepatide (Mounjaro, Zepbound), Dulaglutide  (Trulicity ), etc (GLP1 agonists) AND Canagliflozin (Invokana), Dapagliflozin (Farxiga), Empagliflozin (Jardiance), Ertugliflozin (Steglatro), Bexagliflozin [employer]) or any combination with one of these drugs such as Invokamet (Canagliflozin/Metformin ), Synjardy (Empagliflozin/Metformin ), etc  (SGLT2 inhibitors) must be held around the time of a procedure. This is not a comprehensive list of all of these drugs. Please review all of your medications and talk to your provider if you take any one of these. If you are not sure, ask your provider.         :1}Continue taking your anticoagulant (blood thinner): Warfarin (Coumadin ).  You will need to continue this after your procedure until you are told by your provider that it is safe to stop.    FYI:  For your safety, and to allow us  to monitor your vital signs accurately during the surgery/procedure we request: If you have artificial nails, gel coating, SNS etc, please have those removed prior to your surgery/procedure. Not having the nail coverings /polish removed may result in cancellation or delay of your surgery/procedure.  Your support person will be asked to wait in the waiting room during your procedure.  It is OK to have someone drop you off and come back when you are ready to be discharged.  You cannot drive after the procedure and will need someone to drive you home.  Bring your insurance cards.  *Special Note: Every effort is made to have your procedure done on time. Occasionally there are emergencies that occur at the hospital that may cause delays. Please be patient if a delay does occur.      Follow-Up: At [facility], you and your health needs are our priority.  As part of our continuing mission to provide you with exceptional heart care, our providers are all part of one team.  This team includes your primary Cardiologist (physician) and Advanced Practice Providers or APPs (Physician  Assistants and Nurse Practitioners) who all work together to provide you with the care you need, when you need it.  Your next appointment:   MAY 20 TH AT 8 AM.  Provider:   Daphne Barrack, NP  We recommend signing up for the patient portal called MyChart.  Sign up information is provided on this After Visit Summary.  MyChart is  used to connect with patients for Virtual Visits (Telemedicine).  Patients are able to view lab/test results, encounter notes, upcoming appointments, etc.  Non-urgent messages can be sent to your provider as well.   To learn more about what you can do with MyChart, go to [email or web address].   Other Instructions       1st Floor: - Lobby - Registration  - Pharmacy  - Lab - Cafe  2nd Floor: - PV Lab - Diagnostic Testing (echo, CT, nuclear med)  3rd Floor: - Vacant  4th Floor: - TCTS (cardiothoracic surgery) - AFib Clinic - Structural Heart Clinic - Vascular Surgery  - Vascular Ultrasound  5th Floor: - HeartCare Cardiology (general and EP) - Clinical Pharmacy for coumadin , hypertension, lipid, weight-loss medications, and med management appointments    Valet parking services will be available as well.

## 2023-05-10 NOTE — Telephone Encounter (Signed)
*  STAT* If patient is at the pharmacy, call can be transferred to refill team.   1. Which medications need to be refilled? (please list name of each medication and dose if known)   warfarin (COUMADIN ) 5 MG tablet   2. Would you like to learn more about the convenience, safety, & potential cost savings by using the [facility]?   3. Are you open to using the [facility] (Type [facility]. ).  4. Which pharmacy/location (including street and city if local pharmacy) is medication to be sent to?  [facility] - [place], [place] - [address]   5. Do they need a 30 day or 90 day supply?   30 day  Caller Tacy) stated patient's prescription was sent to [facility] in error and needs to be sent to [facility] in [place].

## 2023-05-11 ENCOUNTER — Other Ambulatory Visit: Payer: Self-pay | Admitting: Cardiology

## 2023-05-13 ENCOUNTER — Telehealth: Payer: Self-pay

## 2023-05-13 MED ORDER — WARFARIN SODIUM 5 MG PO TABS: ORAL_TABLET | ORAL | 1 refills | Status: DC

## 2023-05-13 NOTE — Telephone Encounter (Signed)
 Rx already sent to [facility]

## 2023-05-13 NOTE — Telephone Encounter (Signed)
 Left message for patient to call back, so he is aware of his arrival time for TEE. Please see below.   Dear Bradley Rodriguez   You are scheduled for a TEE (Transesophageal Echocardiogram) Guided Cardioversion on Monday, May 12 at 7:30 AM with Dr. Maximo Spar.  Please arrive at the [facility] (Main Entrance A) at [facility]: [address] [place], [place] [address] at WE WILL CALL YOU WITH TIME (This time is 1 hour(s) before your procedure to ensure your preparation).   Free valet parking service is available. You will check in at ADMITTING.   *Please Note: You will receive a call the day before your procedure to confirm the appointment time. That time may have changed from the original time based on the schedule for that day.*   DIET:  Nothing to eat or drink after midnight except a sip of water  with medications (see medication instructions below)  MEDICATION INSTRUCTIONS: !!IF ANY NEW MEDICATIONS ARE STARTED AFTER TODAY, PLEASE NOTIFY YOUR PROVIDER AS SOON AS POSSIBLE!!  FYI: Medications such as Semaglutide (Ozempic, [place]), Tirzepatide (Mounjaro, Zepbound), Dulaglutide  (Trulicity ), etc ("GLP1 agonists") AND Canagliflozin (Invokana), Dapagliflozin (Farxiga), Empagliflozin (Jardiance), Ertugliflozin (Steglatro), Bexagliflozin [employer]) or any combination with one of these drugs such as Invokamet (Canagliflozin/Metformin ), Synjardy (Empagliflozin/Metformin ), etc ("SGLT2 inhibitors") must be held around the time of a procedure. This is not a comprehensive list of all of these drugs. Please review all of your medications and talk to your provider if you take any one of these. If you are not sure, ask your provider.         :1}Continue taking your anticoagulant (blood thinner): Warfarin (Coumadin ).  You will need to continue this after your procedure until you are told by your provider that it is safe to stop.    FYI:  For your safety, and to allow us  to monitor your vital signs accurately  during the surgery/procedure we request: If you have artificial nails, gel coating, SNS etc, please have those removed prior to your surgery/procedure. Not having the nail coverings /polish removed may result in cancellation or delay of your surgery/procedure.  Your support person will be asked to wait in the waiting room during your procedure.  It is OK to have someone drop you off and come back when you are ready to be discharged.  You cannot drive after the procedure and will need someone to drive you home.  Bring your insurance cards.  *Special Note: Every effort is made to have your procedure done on time. Occasionally there are emergencies that occur at the hospital that may cause delays. Please be patient if a delay does occur.

## 2023-05-15 ENCOUNTER — Ambulatory Visit: Attending: Cardiology | Admitting: *Deleted

## 2023-05-15 DIAGNOSIS — I513 Intracardiac thrombosis, not elsewhere classified: Secondary | ICD-10-CM | POA: Diagnosis not present

## 2023-05-15 DIAGNOSIS — I4891 Unspecified atrial fibrillation: Secondary | ICD-10-CM

## 2023-05-15 DIAGNOSIS — Z5181 Encounter for therapeutic drug level monitoring: Secondary | ICD-10-CM

## 2023-05-15 LAB — POCT INR: INR: 3.2 — AB (ref 2.0–3.0)

## 2023-05-15 NOTE — Patient Instructions (Signed)
 Take warfarin 1 tablet tonight then resume 1 1/2 tablets daily  TEE/DCCV on 06/03/23  Thrombus is still present but much smaller Recheck in 1 week

## 2023-05-22 ENCOUNTER — Ambulatory Visit: Attending: Cardiology

## 2023-05-22 DIAGNOSIS — I513 Intracardiac thrombosis, not elsewhere classified: Secondary | ICD-10-CM | POA: Diagnosis not present

## 2023-05-22 DIAGNOSIS — Z5181 Encounter for therapeutic drug level monitoring: Secondary | ICD-10-CM | POA: Diagnosis not present

## 2023-05-22 DIAGNOSIS — I4891 Unspecified atrial fibrillation: Secondary | ICD-10-CM | POA: Diagnosis not present

## 2023-05-22 LAB — POCT INR: INR: 3.3 — AB (ref 2.0–3.0)

## 2023-05-22 NOTE — Patient Instructions (Signed)
 Description   Start taking 1.5 tablets daily except 1 tablet on Wednesdays.   TEE/DCCV on 06/03/23  Thrombus is still present but much smaller Recheck in 1 week

## 2023-05-27 ENCOUNTER — Ambulatory Visit (HOSPITAL_COMMUNITY): Payer: Medicare HMO | Admitting: Internal Medicine

## 2023-05-27 DIAGNOSIS — I4819 Other persistent atrial fibrillation: Secondary | ICD-10-CM

## 2023-05-29 ENCOUNTER — Ambulatory Visit: Attending: Cardiology | Admitting: *Deleted

## 2023-05-29 DIAGNOSIS — Z5181 Encounter for therapeutic drug level monitoring: Secondary | ICD-10-CM

## 2023-05-29 DIAGNOSIS — I513 Intracardiac thrombosis, not elsewhere classified: Secondary | ICD-10-CM | POA: Diagnosis not present

## 2023-05-29 DIAGNOSIS — I4891 Unspecified atrial fibrillation: Secondary | ICD-10-CM | POA: Diagnosis not present

## 2023-05-29 LAB — POCT INR: INR: 3.6 — AB (ref 2.0–3.0)

## 2023-05-29 NOTE — Patient Instructions (Signed)
 Decrease warfarin to 1 1/2 tablets daily except 1 tablet on Wednesdays and Saturdays TEE/DCCV on 06/03/23  Thrombus is still present but much smaller Recheck in 1 week

## 2023-05-31 NOTE — Progress Notes (Signed)
 Called patient with pre-procedure instructions for Monday May 12th   Patient informed of:   Time to arrive for procedure. 0715 Remain NPO past midnight.  Must have a ride home and a responsible adult to remain with them for 24 hours post procedure.  Confirmed blood thinner. Coumadin , Last INR 3.6 on 5/7 Confirmed no breaks in taking blood thinner for 3+ weeks prior to procedure. Confirmed patient stopped all GLP-1s and GLP-2s for at least one week before procedure.

## 2023-06-02 NOTE — Anesthesia Preprocedure Evaluation (Signed)
 Anesthesia Evaluation  Patient identified by MRN, date of birth, ID band Patient awake    Reviewed: Allergy & Precautions, NPO status , Patient's Chart, lab work & pertinent test results  History of Anesthesia Complications Negative for: history of anesthetic complications  Airway Mallampati: III  TM Distance: >3 FB Neck ROM: Full   Comment: Previous grade I view with glidescope 4, 2-handed mask with OPA Dental  (+)    Pulmonary neg pulmonary ROS   Pulmonary exam normal breath sounds clear to auscultation       Cardiovascular hypertension, Pt. on medications (-) angina (-) Past MI, (-) Cardiac Stents and (-) CABG + dysrhythmias (s/p previous ablation, Watchman) Atrial Fibrillation + Valvular Problems/Murmurs (mild-to-moderate MR/TR)  Rhythm:Regular Rate:Normal  HLD  TEE 03/27/2023: IMPRESSIONS    1. Left ventricular ejection fraction, by estimation, is 45 to 50%. The  left ventricle has mildly decreased function. The left ventricular  internal cavity size was mildly dilated. Left ventricular diastolic  function could not be evaluated.   2. Right ventricular systolic function is normal. The right ventricular  size is normal.   3. There is a left atrial appendage closure device ( Watchman device).  The watchman device is well seated. There are two seperate clots: the  first is the left atrium measuring 1.16 cm x 0.76 cm and the second  appears to be attached to the watchman  device measuring 2.24 cm x 0.831 cm. Left atrial size was mildly dilated.  A left atrial/left atrial appendage thrombus was detected.   4. The mitral valve is abnormal. Mild to moderate mitral valve  regurgitation. No evidence of mitral stenosis.   5. Tricuspid valve regurgitation is mild to moderate.   6. The aortic valve is normal in structure. Aortic valve regurgitation is  not visualized. No aortic stenosis is present.     Neuro/Psych  PSYCHIATRIC  DISORDERS Anxiety     negative neurological ROS     GI/Hepatic Neg liver ROS,GERD  ,,H/o colon cancer s/p hemicolectomy   Endo/Other  diabetes, Type 2    Renal/GU Renal disease (h/o left RCC s/p nephrectomy)     Musculoskeletal  (+) Arthritis ,    Abdominal  (+) + obese  Peds  Hematology  (+) Blood dyscrasia   Anesthesia Other Findings   Reproductive/Obstetrics                              Anesthesia Physical Anesthesia Plan  ASA: 3  Anesthesia Plan: General   Post-op Pain Management: Minimal or no pain anticipated   Induction: Intravenous  PONV Risk Score and Plan: 1 and Propofol  infusion, Treatment may vary due to age or medical condition and Ondansetron   Airway Management Planned: Natural Airway, Simple Face Mask and Mask  Additional Equipment: None  Intra-op Plan:   Post-operative Plan:   Informed Consent: I have reviewed the patients History and Physical, chart, labs and discussed the procedure including the risks, benefits and alternatives for the proposed anesthesia with the patient or authorized representative who has indicated his/her understanding and acceptance.     Dental advisory given  Plan Discussed with: CRNA and Anesthesiologist  Anesthesia Plan Comments: ( )        Anesthesia Quick Evaluation

## 2023-06-03 ENCOUNTER — Encounter (HOSPITAL_COMMUNITY): Payer: Self-pay | Admitting: Internal Medicine

## 2023-06-03 ENCOUNTER — Other Ambulatory Visit: Payer: Self-pay

## 2023-06-03 ENCOUNTER — Ambulatory Visit (HOSPITAL_COMMUNITY)
Admission: RE | Admit: 2023-06-03 | Discharge: 2023-06-03 | Disposition: A | Discharge status: Home / Self Care | Attending: Internal Medicine | Admitting: Internal Medicine

## 2023-06-03 ENCOUNTER — Encounter (HOSPITAL_COMMUNITY)
Admission: RE | Disposition: A | Discharge status: Home / Self Care | Payer: Self-pay | Source: Home / Self Care | Attending: Internal Medicine

## 2023-06-03 ENCOUNTER — Ambulatory Visit (HOSPITAL_BASED_OUTPATIENT_CLINIC_OR_DEPARTMENT_OTHER): Payer: Self-pay | Admitting: Anesthesiology

## 2023-06-03 ENCOUNTER — Ambulatory Visit (HOSPITAL_COMMUNITY)
Admission: RE | Admit: 2023-06-03 | Discharge: 2023-06-03 | Disposition: A | Discharge status: Home / Self Care | Source: Ambulatory Visit | Attending: Internal Medicine | Admitting: Internal Medicine

## 2023-06-03 ENCOUNTER — Ambulatory Visit (HOSPITAL_COMMUNITY): Payer: Self-pay | Admitting: Anesthesiology

## 2023-06-03 DIAGNOSIS — Z85038 Personal history of other malignant neoplasm of large intestine: Secondary | ICD-10-CM | POA: Diagnosis not present

## 2023-06-03 DIAGNOSIS — I4819 Other persistent atrial fibrillation: Secondary | ICD-10-CM | POA: Diagnosis not present

## 2023-06-03 DIAGNOSIS — I513 Intracardiac thrombosis, not elsewhere classified: Secondary | ICD-10-CM | POA: Diagnosis present

## 2023-06-03 DIAGNOSIS — E785 Hyperlipidemia, unspecified: Secondary | ICD-10-CM | POA: Insufficient documentation

## 2023-06-03 DIAGNOSIS — Z905 Acquired absence of kidney: Secondary | ICD-10-CM | POA: Diagnosis not present

## 2023-06-03 DIAGNOSIS — Z95818 Presence of other cardiac implants and grafts: Secondary | ICD-10-CM | POA: Diagnosis not present

## 2023-06-03 DIAGNOSIS — I4891 Unspecified atrial fibrillation: Secondary | ICD-10-CM

## 2023-06-03 DIAGNOSIS — Z79899 Other long term (current) drug therapy: Secondary | ICD-10-CM | POA: Insufficient documentation

## 2023-06-03 DIAGNOSIS — Z85528 Personal history of other malignant neoplasm of kidney: Secondary | ICD-10-CM | POA: Diagnosis not present

## 2023-06-03 DIAGNOSIS — E119 Type 2 diabetes mellitus without complications: Secondary | ICD-10-CM | POA: Insufficient documentation

## 2023-06-03 DIAGNOSIS — Z7901 Long term (current) use of anticoagulants: Secondary | ICD-10-CM | POA: Diagnosis not present

## 2023-06-03 DIAGNOSIS — I10 Essential (primary) hypertension: Secondary | ICD-10-CM | POA: Diagnosis not present

## 2023-06-03 DIAGNOSIS — I340 Nonrheumatic mitral (valve) insufficiency: Secondary | ICD-10-CM

## 2023-06-03 DIAGNOSIS — K219 Gastro-esophageal reflux disease without esophagitis: Secondary | ICD-10-CM | POA: Insufficient documentation

## 2023-06-03 DIAGNOSIS — D6869 Other thrombophilia: Secondary | ICD-10-CM | POA: Diagnosis not present

## 2023-06-03 HISTORY — PX: TRANSESOPHAGEAL ECHOCARDIOGRAM (CATH LAB): EP1270

## 2023-06-03 HISTORY — PX: CARDIOVERSION: EP1203

## 2023-06-03 LAB — ECHO TEE

## 2023-06-03 LAB — POCT I-STAT, CHEM 8
BUN: 32 mg/dL — ABNORMAL HIGH (ref 8–23)
Calcium, Ion: 1.11 mmol/L — ABNORMAL LOW (ref 1.15–1.40)
Chloride: 106 mmol/L (ref 98–111)
Creatinine, Ser: 1.3 mg/dL — ABNORMAL HIGH (ref 0.61–1.24)
Glucose, Bld: 172 mg/dL — ABNORMAL HIGH (ref 70–99)
HCT: 42 % (ref 39.0–52.0)
Hemoglobin: 14.3 g/dL (ref 13.0–17.0)
Potassium: 7.2 mmol/L (ref 3.5–5.1)
Sodium: 136 mmol/L (ref 135–145)
TCO2: 26 mmol/L (ref 22–32)

## 2023-06-03 LAB — PROTIME-INR
INR: 2.2 — ABNORMAL HIGH (ref 0.8–1.2)
Prothrombin Time: 25 s — ABNORMAL HIGH (ref 11.4–15.2)

## 2023-06-03 LAB — POTASSIUM: Potassium: 4.4 mmol/L (ref 3.5–5.1)

## 2023-06-03 SURGERY — TRANSESOPHAGEAL ECHOCARDIOGRAM (TEE) (CATHLAB)
Anesthesia: General

## 2023-06-03 MED ORDER — PROPOFOL 500 MG/50ML IV EMUL
INTRAVENOUS | Status: DC | PRN
  Administered 2023-06-03: 100 ug/kg/min via INTRAVENOUS

## 2023-06-03 MED ORDER — PHENYLEPHRINE HCL-NACL 20-0.9 MG/250ML-% IV SOLN
INTRAVENOUS | Status: DC | PRN
Start: 2023-06-03 — End: 2023-06-03
  Administered 2023-06-03: 40 ug/min via INTRAVENOUS

## 2023-06-03 MED ORDER — LIDOCAINE 2% (20 MG/ML) 5 ML SYRINGE
INTRAMUSCULAR | Status: DC | PRN
  Administered 2023-06-03: 40 mg via INTRAVENOUS

## 2023-06-03 MED ORDER — SODIUM CHLORIDE 0.9 % IV SOLN: INTRAVENOUS | Status: DC

## 2023-06-03 MED ORDER — WARFARIN SODIUM 5 MG PO TABS: ORAL_TABLET | ORAL | 1 refills | Status: DC

## 2023-06-03 MED ORDER — ENOXAPARIN SODIUM 150 MG/ML IJ SOSY
150.0000 mg | PREFILLED_SYRINGE | INTRAMUSCULAR | Status: AC
  Administered 2023-06-03: 150 mg via SUBCUTANEOUS
  Filled 2023-06-03: qty 1

## 2023-06-03 MED ORDER — PROPOFOL 10 MG/ML IV BOLUS
INTRAVENOUS | Status: DC | PRN
Start: 2023-06-03 — End: 2023-06-03
  Administered 2023-06-03: 100 mg via INTRAVENOUS

## 2023-06-03 SURGICAL SUPPLY — 1 items: PAD DEFIB RADIO PHYSIO CONN (PAD) ×2 IMPLANT

## 2023-06-03 NOTE — Interval H&P Note (Signed)
 History and Physical Interval Note:  06/03/2023 8:11 AM  Bradley Rodriguez  has presented today for surgery, with the diagnosis of AFIB, PRESENCE OF WATCHMAN, LA THROMBUS.  The various methods of treatment have been discussed with the patient and family. After consideration of risks, benefits and other options for treatment, the patient has consented to  Procedure(s): TRANSESOPHAGEAL ECHOCARDIOGRAM (N/A) CARDIOVERSION (N/A) as a surgical intervention.  The patient's history has been reviewed, patient examined, no change in status, stable for surgery.  I have reviewed the patient's chart and labs.  Questions were answered to the patient's satisfaction.     Hazle Lites

## 2023-06-03 NOTE — CV Procedure (Addendum)
 TEE/CARDIOVERSION NOTE  TRANSESOPHAGEAL ECHOCARDIOGRAM (TEE):  Indictation: Atrial Fibrillation  Consent:   Informed consent was obtained prior to the procedure. The risks, benefits and alternatives for the procedure were discussed and the patient comprehended these risks.  Risks include, but are not limited to, cough, sore throat, vomiting, nausea, somnolence, esophageal and stomach trauma or perforation, bleeding, low blood pressure, aspiration, pneumonia, infection, trauma to the teeth and death.    Time Out: Verified patient identification, verified procedure, site/side was marked, verified correct patient position, special equipment/implants available, medications/allergies/relevent history reviewed, required imaging and test results available. Performed  Procedure:  After a procedural time-out, the patient was given propofol  per anesthesia for sedation. See their separate report for details. The patient's heart rate, blood pressure, and oxygen saturation are monitored continuously during the procedure. The oropharynx was anesthetized with topical cetacaine .  The transesophageal probe was inserted in the esophagus and stomach without difficulty and multiple views were obtained. Agitated microbubble saline contrast was not administered.  Complications:    Complications: None Patient did tolerate procedure well.  Findings:  LEFT VENTRICLE: The left ventricular wall thickness is mildly increased.  The left ventricular cavity is normal in size. Wall motion is globally hypokinetic.  LVEF is 45-50%.  RIGHT VENTRICLE:  The right ventricle is normal in structure and function without any thrombus or masses.    LEFT ATRIUM:  The left atrium is severely dilated in size without any thrombus or masses.  There is not spontaneous echo contrast ("smoke") in the left atrium consistent with a low flow state.  LEFT ATRIAL APPENDAGE:  The left atrial appendage has been occluded by a Watchman  device. The previously noted thrombus has resolved. There is no evidence of device leak.  ATRIAL SEPTUM:  The atrial septum appears intact and is free of thrombus and/or masses.  There is no evidence for interatrial shunting by color doppler.  RIGHT ATRIUM:  The right atrium is moderately dilated in size and function without any thrombus or masses.  MITRAL VALVE:  The mitral valve is abnormal in structure with bileaflet thickening and with Moderate regurgitation, with multiple jets.  There were no vegetations or stenosis.  AORTIC VALVE:  The aortic valve is trileaflet, normal in structure and function with trivial regurgitation.  There were no vegetations or stenosis  TRICUSPID VALVE:  The tricuspid valve is normal in structure and function with trivial regurgitation.  There were no vegetations or stenosis   PULMONIC VALVE:  The pulmonic valve is normal in structure and function with no regurgitation.  There were no vegetations or stenosis.   AORTIC ARCH, ASCENDING AND DESCENDING AORTA:  There was no Ardell Koller et. Al, 1992) atherosclerosis of the ascending aorta, aortic arch, or proximal descending aorta.  12. PULMONARY VEINS: Anomalous pulmonary venous return was not noted.  13. PERICARDIUM: The pericardium appeared normal and non-thickened.  There is no pericardial effusion.  CARDIOVERSION:     Discussion with Dr. Daneil Dunker and Dr. Marven Slimmer (EP) regarding whether it was ok to proceed with cardioversion. Since the thrombus has resolved and the LAA is successfully closed, they felt it was ok to proceed with cardioversion (even though INR today was slightly low at 2.2).  Second Time Out: Verified patient identification, verified procedure, site/side was marked, verified correct patient position, special equipment/implants available, medications/allergies/relevent history reviewed, required imaging and test results available.  Performed  Procedure:  Patient placed on cardiac monitor, pulse  oximetry, supplemental oxygen as necessary.  Sedation administered per anesthesia  Pacer pads placed anterior and posterior chest. Cardioverted 2 time(s).  Cardioverted at 200J and 300J biphasic.  Complications:  Complications: None Patient did tolerate procedure well.  Impression:  LAA closure with well-seated Watchman device, no residual thrombus noted (2D/3D images) Successful DCCV with 2 shocks at 200J and 300J biphasic to sinus rhythm. Moderate MR, there is bileaflet thickening Moderate RAE, severe LAE LVEF 45-50% with global hypokinesis  Recommendations:  Given lovenox 1 mg/kg subcutaneous x 1 for INR 2.2 (goal 2.5-3.5) - needs close follow-up with coumadin  clinic in [place].   Increase warfarin to 1 1/2 tablets (7.5 mg) daily starting tonight until follow-up appt.  Time Spent Directly with the Patient:  60 minutes   Hazle Lites, MD, [facility], FNLA, FACP  [place]  [facility]  Medical Director of the [facility]  [facility] Diplomate of the American Board of Clinical Lipidology Attending Cardiologist  Direct Dial: [phone]  Fax: [phone]  Website:  [email or web address] Rudell Ortman 06/03/2023, 10:00 AM

## 2023-06-03 NOTE — Discharge Instructions (Signed)
 Electrical Cardioversion Electrical cardioversion is the delivery of a jolt of electricity to restore a normal rhythm to the heart. A rhythm that is too fast or is not regular keeps the heart from pumping well. In this procedure, sticky patches or metal paddles are placed on the chest to deliver electricity to the heart from a device. This procedure may be done in an emergency if: There is low or no blood pressure as a result of the heart rhythm. Normal rhythm must be restored as fast as possible to protect the brain and heart from further damage. It may save a life. This may also be a scheduled procedure for irregular or fast heart rhythms that are not immediately life-threatening.  What can I expect after the procedure? Your blood pressure, heart rate, breathing rate, and blood oxygen level will be monitored until you leave the hospital or clinic. Your heart rhythm will be watched to make sure it does not change. You may have some redness on the skin where the shocks were given. Over the counter cortizone cream may be helpful.  Follow these instructions at home: Do not drive for 24 hours if you were given a sedative during your procedure. Take over-the-counter and prescription medicines only as told by your health care provider. Ask your health care provider how to check your pulse. Check it often. Rest for 48 hours after the procedure or as told by your health care provider. Avoid or limit your caffeine use as told by your health care provider. Keep all follow-up visits as told by your health care provider. This is important. Contact a health care provider if: You feel like your heart is beating too quickly or your pulse is not regular. You have a serious muscle cramp that does not go away. Get help right away if: You have discomfort in your chest. You are dizzy or you feel faint. You have trouble breathing or you are short of breath. Your speech is slurred. You have trouble moving an  arm or leg on one side of your body. Your fingers or toes turn cold or blue. Summary Electrical cardioversion is the delivery of a jolt of electricity to restore a normal rhythm to the heart. This procedure may be done right away in an emergency or may be a scheduled procedure if the condition is not an emergency. Generally, this is a safe procedure. After the procedure, check your pulse often as told by your health care provider. This information is not intended to replace advice given to you by your health care provider. Make sure you discuss any questions you have with your health care provider. Document Revised: 08/11/2018 Document Reviewed: 08/11/2018 Elsevier Patient Education  2020 [employer] TEE   YOU HAD AN CARDIAC PROCEDURE TODAY: Refer to the procedure report and other information in the discharge instructions given to you for any specific questions about what was found during the examination. If this information does not answer your questions, please call Dr. Verl Dicker office at [phone] to clarify.   DIET: Your first meal following the procedure should be a light meal and then it is ok to progress to your normal diet. A half-sandwich or bowl of soup is an example of a good first meal. Heavy or fried foods are harder to digest and may make you feel nauseous or bloated. Drink plenty of fluids but you should avoid alcoholic beverages for 24 hours. If you had a esophageal dilation, please see attached instructions for diet.   ACTIVITY:  Your care partner should take you home directly after the procedure. You should plan to take it easy, moving slowly for the rest of the day. You can resume normal activity the day after the procedure however YOU SHOULD NOT DRIVE, use power tools, machinery or perform tasks that involve climbing or major physical exertion for 24 hours (because of the sedation medicines used during the test).   SYMPTOMS TO REPORT IMMEDIATELY: A cardiologist can be  reached at any hour. Please call [phone] for any of the following symptoms:  Vomiting of blood or coffee ground material  New, significant abdominal pain  New, significant chest pain or pain under the shoulder blades  Painful or persistently difficult swallowing  New shortness of breath  Black, tarry-looking or red, bloody stools  FOLLOW UP:  Please also call with any specific questions about appointments or follow up tests.

## 2023-06-03 NOTE — Anesthesia Postprocedure Evaluation (Signed)
 Anesthesia Post Note  Patient: Bradley Rodriguez  Procedure(s) Performed: TRANSESOPHAGEAL ECHOCARDIOGRAM CARDIOVERSION     Patient location during evaluation: PACU Anesthesia Type: General Level of consciousness: awake and alert Pain management: pain level controlled Vital Signs Assessment: post-procedure vital signs reviewed and stable Respiratory status: spontaneous breathing, nonlabored ventilation, respiratory function stable and patient connected to nasal cannula oxygen Cardiovascular status: blood pressure returned to baseline and stable Postop Assessment: no apparent nausea or vomiting Anesthetic complications: no   No notable events documented.  Last Vitals:  Vitals:   06/03/23 1020 06/03/23 1030  BP: 113/83 111/83  Pulse: (!) 55 (!) 58  Resp: 14 16  Temp:  36.8 C  SpO2: 97% 98%    Last Pain:  Vitals:   06/03/23 1030  TempSrc: Temporal  PainSc: 0-No pain                 Mattelyn Imhoff

## 2023-06-03 NOTE — Transfer of Care (Signed)
 Immediate Anesthesia Transfer of Care Note  Patient: Bradley Rodriguez  Procedure(s) Performed: TRANSESOPHAGEAL ECHOCARDIOGRAM CARDIOVERSION  Patient Location: PACU  Anesthesia Type:MAC  Level of Consciousness: sedated  Airway & Oxygen Therapy: Patient Spontanous Breathing  Post-op Assessment: Report given to RN  Post vital signs: Reviewed and stable  Last Vitals:  Vitals Value Taken Time  BP 111/76   Temp    Pulse 53   Resp 20   SpO2 99     Last Pain:  Vitals:   06/03/23 0704  TempSrc: Temporal         Complications: No notable events documented.

## 2023-06-05 ENCOUNTER — Ambulatory Visit: Attending: Cardiology | Admitting: *Deleted

## 2023-06-05 DIAGNOSIS — I4891 Unspecified atrial fibrillation: Secondary | ICD-10-CM | POA: Diagnosis not present

## 2023-06-05 DIAGNOSIS — I513 Intracardiac thrombosis, not elsewhere classified: Secondary | ICD-10-CM | POA: Diagnosis not present

## 2023-06-05 DIAGNOSIS — Z5181 Encounter for therapeutic drug level monitoring: Secondary | ICD-10-CM

## 2023-06-05 LAB — POCT INR: INR: 2.8 (ref 2.0–3.0)

## 2023-06-05 NOTE — Patient Instructions (Signed)
 Continue warfarin 1 1/2 tablets daily except 1 tablet on Wednesdays and Saturdays S/P TEE/DCCV on 06/03/23  Thrombus is gone. Recheck in 1 week

## 2023-06-10 NOTE — Progress Notes (Addendum)
 Electrophysiology Office Note:   Date:  06/11/2023  ID:  Bradley Rodriguez, DOB 1960-03-16, MRN [id number]  Primary Cardiologist: Armida Lander, MD Primary Heart Failure: None Electrophysiologist: Ardeen Kohler, MD      History of Present Illness:   Bradley Rodriguez is a 63 y.o. male with h/o AF s/p ablation & LAA closure / Watchman, renal/colon cancer seen today for routine electrophysiology follow-up s/p DCCV.  Since last being seen in our clinic the patient reports doing very well. He feels so much better physically and he is no longer worried about dislodging the clot. He asks if it is ok that his HR is in the 50's.  No issues with flecainide .     He denies chest pain, palpitations, dyspnea, PND, orthopnea, nausea, vomiting, dizziness, syncope, edema, weight gain, or early satiety.    Review of systems complete and found to be negative unless listed in HPI.   EP Information / Studies Reviewed:    EKG is ordered today. Personal review as below.  EKG Interpretation Date/Time:  Tuesday Jun 11 2023 08:02:18 EDT Ventricular Rate:  50 PR Interval:  198 QRS Duration:  106 QT Interval:  494 QTC Calculation: 450 R Axis:   33  Text Interpretation: Sinus bradycardia Confirmed by Creighton Doffing ([id number]) on 06/11/2023 8:11:03 AM   Studies:  Cardiac CT 02/01/23 > LAA is a large chicken wing morphology w/o thrombus, small PFO, CAC 253 which is 73rd percentile for matched controls TEE 02/21/23 > LVEF 50-55%, LA mildly dilated EPS 02/21/23 > successful PVI, ablation / isolation of the posterior wall, ICE reveals normal LV size / function, trivial pericardial effusion, successful implantation of 31 mm Watchman FLX Pro LAA Occlusive device TEE 03/26/33 > mildly depressed ejection fraction, mild to mod MV regurgitation, LA thrombus adherent to LAA occlusive device, cardioversion not performed  LHC 03/25/23 > proximal RCA lesion 20% stenosed, 2nd Mrg lesion 20% stenosed, distal LAD lesion 20% stenosed > mild  non-obs CAD, LVEDP 15 mmHg TEE 05/03/23 > much smaller and no longer mobile, there is still a thrombus on the surface of the Watchman device, LVEF 45%, severely dilated LA, moderate MR  TEE w/ DCCV 06/03/23 > resolved thrombus, moderate RAE, severe LAE, moderate MR, LVEF 45-50%, 200j, 300 j > NSR   Arrhythmia / AAD AF  AF ablation & LAA Occlusion 02/21/23   Risk Assessment/Calculations:    CHA2DS2-VASc Score = 2   This indicates a 2.2% annual risk of stroke. The patient's score is based upon: CHF History: 0 HTN History: 1 Diabetes History: 1 Stroke History: 0 Vascular Disease History: 0 Age Score: 0 Gender Score: 0             Physical Exam:   VS:  BP 110/66   Pulse (!) 50   Ht 6\' 8"  (2.032 m)   Wt (!) 325 lb (147.4 kg)   SpO2 96%   BMI 35.70 kg/m    Wt Readings from Last 3 Encounters:  06/11/23 (!) 325 lb (147.4 kg)  05/10/23 (!) 323 lb (146.5 kg)  05/03/23 (!) 312 lb (141.5 kg)     GEN: pleasant, well nourished, well developed in no acute distress NECK: No JVD; No carotid bruits CARDIAC: Regular rate and rhythm, no murmurs, rubs, gallops RESPIRATORY:  Clear to auscultation without rales, wheezing or rhonchi  ABDOMEN: Soft, non-tender, non-distended EXTREMITIES:  No edema; No deformity   ASSESSMENT AND PLAN:    Persistent Atrial Fibrillation  High Risk Medication Monitoring: Flecainide  Failed  AAD, s/p catheter ablation and early recurrence, rhythm control complicated by device related thrombus.  Ultimately, thrombus resolved on TEE 06/03/23, s/p DCCV x2 with restoration of NSR with 300j -EKG with NSR, stable intervals   -continue Toprol  100 mg BID  -Cardizem  CD 120 mg daily  -increase flecainide  to 150 mg BID    -ETT in one week   -we discussed leaving medications as they currently are for ~3 months and revisiting slow reduction as he was curious about his HR's > reassured that given he is asymptomatic, HR in 50's are ok  Watchman Device in Situ  Device  Related Thrombus  TEE showed well sealed Watchman device with no leak, however device related thrombus seen on surface of device, smaller on 05/03/23 ECHO, resolved on 06/03/23 TEE -coumadin  per [facility]  -s/p lovenox  after DCCV  -hx of malignancy, recent CT Abd/Pelvis unremarkable   Secondary Hypercoagulable State  -continue Coumadin   -INR goal 2.5-3  -most recent INR 06/05/23 2.8  -continue coumadin  through June 12th (stop on June 13th) > one month past his DCCV  Hx Colon / Renal Cancer  -follows with [facility]    Informed Consent   Shared Decision Making/Informed Consent The risks [chest pain, shortness of breath, cardiac arrhythmias, dizziness, blood pressure fluctuations, myocardial infarction, stroke/transient ischemic attack, and life-threatening complications (estimated to be 1 in 10,000)], benefits (risk stratification, diagnosing coronary artery disease, treatment guidance) and alternatives of an exercise tolerance test were discussed in detail with Bradley Rodriguez and he agrees to proceed.      Follow up with Dr. Daneil Dunker or EP APP  in 3 months    Signed, Creighton Doffing, NP-C, AGACNP-BC [facility] - Electrophysiology  06/11/2023, 9:25 AM    Addendum 06/12/23: Reviewed plan of care with Dr. Daneil Dunker, will plan for CT Coronary Morphology for review in 6 weeks to ensure no clot.  Called patient and discussed addition to plan of care.   Creighton Doffing, NP-C, AGACNP-BC [facility] - Electrophysiology  06/12/2023, 8:34 AM

## 2023-06-11 ENCOUNTER — Encounter: Payer: Self-pay | Admitting: Pulmonary Disease

## 2023-06-11 ENCOUNTER — Ambulatory Visit: Attending: Pulmonary Disease | Admitting: Pulmonary Disease

## 2023-06-11 VITALS — BP 110/66 | HR 50 | Ht >= 80 in | Wt 325.0 lb

## 2023-06-11 DIAGNOSIS — Z95818 Presence of other cardiac implants and grafts: Secondary | ICD-10-CM | POA: Diagnosis not present

## 2023-06-11 DIAGNOSIS — I4819 Other persistent atrial fibrillation: Secondary | ICD-10-CM | POA: Diagnosis not present

## 2023-06-11 DIAGNOSIS — D6869 Other thrombophilia: Secondary | ICD-10-CM | POA: Diagnosis not present

## 2023-06-11 DIAGNOSIS — Z79899 Other long term (current) drug therapy: Secondary | ICD-10-CM

## 2023-06-11 DIAGNOSIS — I513 Intracardiac thrombosis, not elsewhere classified: Secondary | ICD-10-CM | POA: Diagnosis not present

## 2023-06-11 MED ORDER — FLECAINIDE ACETATE 150 MG PO TABS: 150.0000 mg | ORAL_TABLET | Freq: Two times a day (BID) | ORAL | 6 refills | Status: DC

## 2023-06-11 NOTE — Patient Instructions (Addendum)
 Medication Instructions:  Increase FLECAINIDE  to 150 mg Twice Daily    Stop Coumadin  on June 13th, 2025  *If you need a refill on your cardiac medications before your next appointment, please call your pharmacy*  Lab Work: No lab work today If you have labs (blood work) drawn today and your tests are completely normal, you will receive your results only by: MyChart Message (if you have MyChart) OR A paper copy in the mail If you have any lab test that is abnormal or we need to change your treatment, we will call you to review the results.  Testing/Procedures: An Exercise Tolerance Test was ordered today for review of flecainide     Follow-Up: At [facility], you and your health needs are our priority.  As part of our continuing mission to provide you with exceptional heart care, our providers are all part of one team.  This team includes your primary Cardiologist (physician) and Advanced Practice Providers or APPs (Physician Assistants and Nurse Practitioners) who all work together to provide you with the care you need, when you need it.  Your next appointment:   3 month(s)  Provider:   Ardeen Kohler, MD or Creighton Doffing, NP

## 2023-06-12 ENCOUNTER — Ambulatory Visit: Attending: Cardiology | Admitting: *Deleted

## 2023-06-12 DIAGNOSIS — I513 Intracardiac thrombosis, not elsewhere classified: Secondary | ICD-10-CM | POA: Diagnosis not present

## 2023-06-12 DIAGNOSIS — I4891 Unspecified atrial fibrillation: Secondary | ICD-10-CM

## 2023-06-12 DIAGNOSIS — Z5181 Encounter for therapeutic drug level monitoring: Secondary | ICD-10-CM

## 2023-06-12 LAB — POCT INR: INR: 2.1 (ref 2.0–3.0)

## 2023-06-12 NOTE — Patient Instructions (Signed)
 Take warfarin 1 1/2 tablets tonight then resume 1 1/2 tablets daily except 1 tablet on Wednesdays and Saturdays S/P TEE/DCCV on 06/03/23  Thrombus is gone. Recheck in 1 week Can stop warfarin on 07/05/23 per MD

## 2023-06-12 NOTE — Addendum Note (Signed)
 Addended by: Thomasena Fleming on: 06/12/2023 08:34 AM   Modules accepted: Orders

## 2023-06-20 ENCOUNTER — Ambulatory Visit: Attending: Cardiology | Admitting: *Deleted

## 2023-06-20 DIAGNOSIS — I513 Intracardiac thrombosis, not elsewhere classified: Secondary | ICD-10-CM | POA: Diagnosis not present

## 2023-06-20 DIAGNOSIS — Z5181 Encounter for therapeutic drug level monitoring: Secondary | ICD-10-CM

## 2023-06-20 DIAGNOSIS — I4891 Unspecified atrial fibrillation: Secondary | ICD-10-CM | POA: Diagnosis not present

## 2023-06-20 LAB — POCT INR: INR: 2.2 (ref 2.0–3.0)

## 2023-06-20 NOTE — Patient Instructions (Signed)
 Take warfarin 1 1/2 tablets daily till finished on 07/05/23 S/P TEE/DCCV on 06/03/23  Thrombus is gone. Stop warfarin on 07/05/23 per MD

## 2023-06-27 ENCOUNTER — Encounter (HOSPITAL_COMMUNITY): Payer: Self-pay

## 2023-06-27 ENCOUNTER — Other Ambulatory Visit: Payer: Self-pay

## 2023-06-27 DIAGNOSIS — Z95818 Presence of other cardiac implants and grafts: Secondary | ICD-10-CM

## 2023-06-27 DIAGNOSIS — I4891 Unspecified atrial fibrillation: Secondary | ICD-10-CM

## 2023-06-27 DIAGNOSIS — I513 Intracardiac thrombosis, not elsewhere classified: Secondary | ICD-10-CM

## 2023-06-28 ENCOUNTER — Telehealth (HOSPITAL_COMMUNITY): Payer: Self-pay | Admitting: Emergency Medicine

## 2023-06-28 NOTE — Telephone Encounter (Signed)
 Attempted to call patient regarding upcoming cardiac CT appointment. Left message on voicemail with name and callback number Rockwell Alexandria RN Navigator Cardiac Imaging [facility] [phone] Office [phone] Cell

## 2023-07-01 ENCOUNTER — Ambulatory Visit (HOSPITAL_COMMUNITY)
Admission: RE | Admit: 2023-07-01 | Discharge: 2023-07-01 | Disposition: A | Discharge status: Home / Self Care | Source: Ambulatory Visit | Attending: Pulmonary Disease | Admitting: Pulmonary Disease

## 2023-07-01 DIAGNOSIS — I513 Intracardiac thrombosis, not elsewhere classified: Secondary | ICD-10-CM | POA: Insufficient documentation

## 2023-07-01 DIAGNOSIS — I4891 Unspecified atrial fibrillation: Secondary | ICD-10-CM | POA: Diagnosis not present

## 2023-07-01 DIAGNOSIS — Z95818 Presence of other cardiac implants and grafts: Secondary | ICD-10-CM | POA: Insufficient documentation

## 2023-07-01 MED ORDER — IOHEXOL 350 MG/ML SOLN
100.0000 mL | Freq: Once | INTRAVENOUS | Status: AC | PRN
  Administered 2023-07-01: 100 mL via INTRAVENOUS

## 2023-07-02 ENCOUNTER — Ambulatory Visit: Payer: Self-pay | Admitting: Pulmonary Disease

## 2023-07-09 ENCOUNTER — Telehealth: Payer: Self-pay | Admitting: Pulmonary Disease

## 2023-07-09 NOTE — Telephone Encounter (Signed)
 Pt reached out regarding need for flecainide .  He is at [employer] in [place], [place]. Inadvertently did not take enough flecainide  [place] him to the beach.     Called [facility] at [phone] (address [address])  Flecainide  150 mg BID PO, #60 with one refill (unsure of duration of vacation at beach).    Creighton Doffing, NP-C, AGACNP-BC [facility] - Electrophysiology  07/09/2023, 12:37 PM

## 2023-07-12 ENCOUNTER — Encounter (HOSPITAL_COMMUNITY): Payer: Self-pay | Admitting: *Deleted

## 2023-07-12 ENCOUNTER — Ambulatory Visit: Payer: Medicare HMO | Admitting: Cardiology

## 2023-07-16 ENCOUNTER — Telehealth (HOSPITAL_COMMUNITY): Payer: Self-pay

## 2023-07-16 NOTE — Telephone Encounter (Signed)
 Detailed instructions left on the patient's answering machine. Bradley Rodriguez CCT

## 2023-07-19 ENCOUNTER — Ambulatory Visit (HOSPITAL_COMMUNITY)
Admission: RE | Admit: 2023-07-19 | Discharge: 2023-07-19 | Disposition: A | Discharge status: Home / Self Care | Source: Ambulatory Visit | Attending: Pulmonary Disease | Admitting: Pulmonary Disease

## 2023-07-19 DIAGNOSIS — I4819 Other persistent atrial fibrillation: Secondary | ICD-10-CM | POA: Diagnosis not present

## 2023-07-19 DIAGNOSIS — Z95818 Presence of other cardiac implants and grafts: Secondary | ICD-10-CM | POA: Insufficient documentation

## 2023-07-19 DIAGNOSIS — Z79899 Other long term (current) drug therapy: Secondary | ICD-10-CM | POA: Diagnosis not present

## 2023-07-19 LAB — EXERCISE TOLERANCE TEST
Angina Index: 0
Duke Treadmill Score: 6
Estimated workload: 6.8
Exercise duration (min): 5 min
Exercise duration (sec): 54 s
MPHR: 156 {beats}/min
Peak HR: 133 {beats}/min
Percent HR: 85 %
Rest HR: 67 {beats}/min
ST Depression (mm): 0 mm

## 2023-07-21 ENCOUNTER — Ambulatory Visit: Payer: Self-pay | Admitting: Pulmonary Disease

## 2023-08-13 DIAGNOSIS — I10 Essential (primary) hypertension: Secondary | ICD-10-CM | POA: Diagnosis not present

## 2023-08-13 DIAGNOSIS — E1165 Type 2 diabetes mellitus with hyperglycemia: Secondary | ICD-10-CM | POA: Diagnosis not present

## 2023-08-13 DIAGNOSIS — D649 Anemia, unspecified: Secondary | ICD-10-CM | POA: Diagnosis not present

## 2023-08-13 DIAGNOSIS — Z125 Encounter for screening for malignant neoplasm of prostate: Secondary | ICD-10-CM | POA: Diagnosis not present

## 2023-08-13 DIAGNOSIS — E875 Hyperkalemia: Secondary | ICD-10-CM | POA: Diagnosis not present

## 2023-08-13 DIAGNOSIS — Z1329 Encounter for screening for other suspected endocrine disorder: Secondary | ICD-10-CM | POA: Diagnosis not present

## 2023-08-13 DIAGNOSIS — N1831 Chronic kidney disease, stage 3a: Secondary | ICD-10-CM | POA: Diagnosis not present

## 2023-08-13 DIAGNOSIS — E7849 Other hyperlipidemia: Secondary | ICD-10-CM | POA: Diagnosis not present

## 2023-08-20 DIAGNOSIS — Z1331 Encounter for screening for depression: Secondary | ICD-10-CM | POA: Diagnosis not present

## 2023-08-20 DIAGNOSIS — Z0001 Encounter for general adult medical examination with abnormal findings: Secondary | ICD-10-CM | POA: Diagnosis not present

## 2023-08-20 DIAGNOSIS — Z6834 Body mass index (BMI) 34.0-34.9, adult: Secondary | ICD-10-CM | POA: Diagnosis not present

## 2023-08-20 DIAGNOSIS — E782 Mixed hyperlipidemia: Secondary | ICD-10-CM | POA: Diagnosis not present

## 2023-08-20 DIAGNOSIS — N1831 Chronic kidney disease, stage 3a: Secondary | ICD-10-CM | POA: Diagnosis not present

## 2023-08-20 DIAGNOSIS — Z1389 Encounter for screening for other disorder: Secondary | ICD-10-CM | POA: Diagnosis not present

## 2023-08-20 DIAGNOSIS — K580 Irritable bowel syndrome with diarrhea: Secondary | ICD-10-CM | POA: Diagnosis not present

## 2023-08-20 DIAGNOSIS — E7849 Other hyperlipidemia: Secondary | ICD-10-CM | POA: Diagnosis not present

## 2023-09-05 NOTE — Progress Notes (Unsigned)
 Electrophysiology Office Note:   Date:  09/11/2023  ID:  Bradley Rodriguez, DOB 16-Jun-1960, MRN [id number]  Primary Cardiologist: Alvan Carrier, MD Primary Heart Failure: None Electrophysiologist: Fonda Kitty, MD      History of Present Illness:   Bradley Rodriguez is a 63 y.o. male with h/o AF s/p ablation & LAA closure / Watchman, renal/colon cancer seen today for routine electrophysiology followup.   Since last being seen in our clinic the patient reports doing well over all. He has noticed fatigue and lightheadedness at times when standing. He reports he has to stand for a second to allow the feeling to pass. No syncope. He reports his HR's are slow / in the 50's most of the time. No evidence of AF.  In discussion, he reports he has not been on cardizem  120 mg.  No issues off coumadin . He is hopeful to come off flecainide  at some point. Notes off target effects of beta blocker.   He denies chest pain, palpitations, dyspnea, PND, orthopnea, nausea, vomiting, dizziness, syncope, edema, weight gain, or early satiety.   Review of systems complete and found to be negative unless listed in HPI.   EP Information / Studies Reviewed:    EKG is ordered today. Personal review as below.  EKG Interpretation Date/Time:  Wednesday September 11 2023 09:24:43 EDT Ventricular Rate:  55 PR Interval:  194 QRS Duration:  114 QT Interval:  474 QTC Calculation: 453 R Axis:   45  Text Interpretation: Sinus bradycardia with Premature atrial complexes Confirmed by Aniceto Jarvis ([id number]) on 09/11/2023 9:34:26 AM   Studies:  TEE 02/21/23 > LVEF 50-55%, LA mildly dilated EPS 02/21/23 > successful PVI, ablation / isolation of the posterior wall, ICE reveals normal LV size / function, trivial pericardial effusion, successful implantation of 31 mm Watchman FLX Pro LAA Occlusive device TEE 03/26/33 > mildly depressed ejection fraction, mild to mod MV regurgitation, LA thrombus adherent to LAA occlusive device,  cardioversion not performed  LHC 03/25/23 > proximal RCA lesion 20% stenosed, 2nd Mrg lesion 20% stenosed, distal LAD lesion 20% stenosed > mild non-obs CAD, LVEDP 15 mmHg TEE 05/03/23 > much smaller and no longer mobile, there is still a thrombus on the surface of the Watchman device, LVEF 45%, severely dilated LA, moderate MR  TEE w/ DCCV 06/03/23 > resolved thrombus, moderate RAE, severe LAE, moderate MR, LVEF 45-50%, 200j, 300 j > NSR   Arrhythmia / AAD AF  AF ablation & LAA Occlusion 02/21/23 Coumadin  stopped 07/05/23 after DCCV     Risk Assessment/Calculations:    CHA2DS2-VASc Score = 2   This indicates a 2.2% annual risk of stroke. The patient's score is based upon: CHF History: 0 HTN History: 1 Diabetes History: 1 Stroke History: 0 Vascular Disease History: 0 Age Score: 0 Gender Score: 0    HYPERTENSION CONTROL Vitals:   09/11/23 0927 09/11/23 1102  BP: (!) 142/72 (!) 140/70    The patient's blood pressure is elevated above target today.  In order to address the patient's elevated BP: Blood pressure will be monitored at home to determine if medication changes need to be made.           Physical Exam:   VS:  BP (!) 140/70   Pulse (!) 55   Ht 6' 8 (2.032 m)   Wt (!) 322 lb 6.4 oz (146.2 kg)   SpO2 96%   BMI 35.42 kg/m    Wt Readings from Last 3 Encounters:  09/11/23 ROLLEN)  322 lb 6.4 oz (146.2 kg)  06/11/23 (!) 325 lb (147.4 kg)  05/10/23 (!) 323 lb (146.5 kg)     GEN: Well nourished, well developed in no acute distress NECK: No JVD; No carotid bruits CARDIAC: Regular rate and rhythm with occasional ectopy, no murmurs, rubs, gallops RESPIRATORY:  Clear to auscultation without rales, wheezing or rhonchi  ABDOMEN: Soft, non-tender, non-distended EXTREMITIES:  No edema; No deformity   ASSESSMENT AND PLAN:    Persistent Atrial Fibrillation  High Risk Medication Monitoring: Flecainide  Failed AAD, s/p catheter ablation and early recurrence, rhythm control  complicated by device related thrombus.  Ultimately, thrombus resolved on TEE 06/03/23, s/p DCCV x2 with restoration of NSR with 300j  -EKG with NSR, stable intervals on flecainide   -continue flecainide  150 mg BID  -will have patient follow up with Dr. Kennyth in 3 months > if remains in NSR, that would be 6 months duration > to consider possible weaning of flecainide    -reduce Toprol  to 100 mg at bedtime > in setting of fatigue / lightheadedness, off target effects of beta blocker (reviewed with Dr. Kennyth) -removed Cardizem  CD from his med list as he had not been taking it  Watchman Device in Situ  Device Related Thrombus - Resolved  TEE showed well sealed Watchman device with no leak, however device related thrombus seen on surface of device, smaller on 05/03/23 ECHO, resolved on 06/03/23 TEE -off Coumadin  as of June 13th & no issues   Hypertension  -BP elevated in Clinic and is not typically > he reports home BP 115-120's/70's.   -asked patient to check BP over the coming two weeks and check in with his PCP regarding BP control.  Reviewed if consistently >140, may need to adjust BP regimen > defer to PCP who has been managing   Hx of Colon / Renal Cancer  -follows with ONC  Follow up with Dr. Kennyth in 3 months  Signed, Daphne Barrack, NP-C, AGACNP-BC [facility] - Electrophysiology  09/11/2023, 11:06 AM

## 2023-09-11 ENCOUNTER — Ambulatory Visit: Attending: Cardiology | Admitting: Pulmonary Disease

## 2023-09-11 ENCOUNTER — Encounter: Payer: Self-pay | Admitting: Pulmonary Disease

## 2023-09-11 VITALS — BP 140/70 | HR 55 | Ht >= 80 in | Wt 322.4 lb

## 2023-09-11 DIAGNOSIS — Z95818 Presence of other cardiac implants and grafts: Secondary | ICD-10-CM

## 2023-09-11 DIAGNOSIS — Z5181 Encounter for therapeutic drug level monitoring: Secondary | ICD-10-CM

## 2023-09-11 DIAGNOSIS — I4819 Other persistent atrial fibrillation: Secondary | ICD-10-CM | POA: Diagnosis not present

## 2023-09-11 MED ORDER — METOPROLOL SUCCINATE ER 100 MG PO TB24: 100.0000 mg | ORAL_TABLET | Freq: Every day | ORAL | Status: DC

## 2023-09-11 NOTE — Patient Instructions (Signed)
 Medication Instructions:  Decrease metoprolol  succinate (Toprol  XL) to 100 mg every night *If you need a refill on your cardiac medications before your next appointment, please call your pharmacy*  Lab Work: None ordered If you have labs (blood work) drawn today and your tests are completely normal, you will receive your results only by: MyChart Message (if you have MyChart) OR A paper copy in the mail If you have any lab test that is abnormal or we need to change your treatment, we will call you to review the results.  Follow-Up: At [facility], you and your health needs are our priority.  As part of our continuing mission to provide you with exceptional heart care, our providers are all part of one team.  This team includes your primary Cardiologist (physician) and Advanced Practice Providers or APPs (Physician Assistants and Nurse Practitioners) who all work together to provide you with the care you need, when you need it.  Your next appointment:   3 month(s)  Provider:   Fonda Kitty, MD    Other Instructions Keep a log of your blood pressure readings at home and call and schedule a 1-2 week follow up appointment with your primary care provider

## 2023-11-06 ENCOUNTER — Encounter (INDEPENDENT_AMBULATORY_CARE_PROVIDER_SITE_OTHER): Payer: Self-pay | Admitting: Gastroenterology

## 2023-12-09 NOTE — Progress Notes (Signed)
 " Electrophysiology Office Note:   Date:  12/11/2023  ID:  Bradley Rodriguez, DOB Dec 23, 1960, MRN [id number]  Primary Cardiologist: Alvan Carrier, MD Electrophysiologist: Fonda Kitty, MD      History of Present Illness:   Bradley Rodriguez is a 63 y.o. male with h/o persistent atrial fibrillation s/p PVI and Watchman on 02/21/23 c/b DRT, history of colon and renal cancers who is being  seen today for EP follow up.   Discussed the use of AI scribe software for clinical note transcription with the patient, who gave verbal consent to proceed.  History of Present Illness Bradley Rodriguez is a 63 year old male with atrial fibrillation who presents for follow-up regarding medication management and fatigue.  He has a history of atrial fibrillation and has been treated with flecainide  and metoprolol . He experiences fatigue, which he attributes to his medication regimen. He has independently reduced his metoprolol  and flecainide  intake to once daily, which he believes has alleviated some of his fatigue.  He has been off warfarin for some time and is currently not on diltiazem , which he had been on previously. He notes that metoprolol  causes significant fatigue, and he has experienced improvement in his energy levels since reducing the dose.  His blood pressure was 160/90 mmHg this morning, which is higher than his usual readings. He mentions that he has not had blood pressure issues until recent medication changes. He recalls that his blood pressure was lower when he was in atrial fibrillation.  He has not experienced any atrial fibrillation episodes since his last cardioversion and feels significantly better in normal rhythm, describing it as a 'totally different' experience. He used to feel 'flutters' but no longer does.  He has a history of a clot formation despite being on Eliquis , which was unexpected and concerning. He has undergone a Watchman procedure, and recent imaging shows the device is well  endothelialized with no clot formation.  No headaches. Reports fatigue, which has improved with reduction of metoprolol .   Review of systems complete and found to be negative unless listed in HPI.   EP Information / Studies Reviewed:    EKG is ordered today. Personal review as below.  EKG Interpretation Date/Time:  Tuesday December 10 2023 09:38:05 EST Ventricular Rate:  56 PR Interval:  168 QRS Duration:  94 QT Interval:  432 QTC Calculation: 416 R Axis:   67  Text Interpretation: Sinus bradycardia When compared with ECG of 11-Sep-2023 09:24, Premature atrial complexes are no longer Present Confirmed by Kitty Fonda [phone]) on 12/10/2023 9:56:58 AM   CT Watchman 07/01/23:  IMPRESSION: 1. A 31 mm watchman FLX device has been implanted. It is well seated in the left atrial appendage with no peridevice leak. No device related thrombus.      Physical Exam:   VS:  BP (!) 164/90 (BP Location: Left Arm, Patient Position: Sitting, Cuff Size: Large)   Pulse (!) 56   Ht 6' 8 (2.032 m)   Wt (!) 318 lb (144.2 kg)   SpO2 98%   BMI 34.93 kg/m    Wt Readings from Last 3 Encounters:  12/10/23 (!) 318 lb (144.2 kg)  09/11/23 (!) 322 lb 6.4 oz (146.2 kg)  06/11/23 (!) 325 lb (147.4 kg)     General: Well developed, in no acute distress.  Neck: No JVD.  Cardiac: Bradycardic, regular rhythm.  Resp: Normal work of breathing.  Ext: No edema.  Neuro: No gross focal deficits.  Psych: Normal affect.  ASSESSMENT AND PLAN:    Persistent Atrial Fibrillation: S/p PVI and posterior wall ablation.  High Risk Medication Monitoring: Flecainide . Normal PR and QRS intervals. -Maintaining sinus rhythm since cardioversion in May 2025.  -He has had some fatigue, which I suspect is related to metoprolol .  We will stop metoprolol  and start diltiazem  120 mg once daily. -Continue flecainide  150 mg twice daily.  He will follow-up in 6 months and if he has not had any recurrence of atrial  fibrillation, then we can consider decreasing flecainide .   Watchman Device in Situ  Device Related Thrombus: Resolved on CT in June 2025. Hypercoagulable state due to AF -Continue aspirin  81 mg once daily.   Hypertension -Above goal today. Starting diltiazem  in lieu of metoprolol , which may help with BP slightly.  Recommend checking blood pressures 1-2 times per week at home and recording the values.  Recommend bringing these recordings to the primary care physician.    Follow up with EP APP in 6 months  Signed, Fonda Kitty, MD  "

## 2023-12-10 ENCOUNTER — Encounter: Payer: Self-pay | Admitting: Cardiology

## 2023-12-10 ENCOUNTER — Ambulatory Visit: Attending: Cardiology | Admitting: Cardiology

## 2023-12-10 VITALS — BP 164/90 | HR 56 | Ht >= 80 in | Wt 318.0 lb

## 2023-12-10 DIAGNOSIS — Z95818 Presence of other cardiac implants and grafts: Secondary | ICD-10-CM

## 2023-12-10 DIAGNOSIS — D6869 Other thrombophilia: Secondary | ICD-10-CM

## 2023-12-10 DIAGNOSIS — Z79899 Other long term (current) drug therapy: Secondary | ICD-10-CM

## 2023-12-10 DIAGNOSIS — I10 Essential (primary) hypertension: Secondary | ICD-10-CM | POA: Diagnosis not present

## 2023-12-10 DIAGNOSIS — T82867A Thrombosis of cardiac prosthetic devices, implants and grafts, initial encounter: Secondary | ICD-10-CM

## 2023-12-10 DIAGNOSIS — I4819 Other persistent atrial fibrillation: Secondary | ICD-10-CM

## 2023-12-10 DIAGNOSIS — T82867D Thrombosis of cardiac prosthetic devices, implants and grafts, subsequent encounter: Secondary | ICD-10-CM

## 2023-12-10 MED ORDER — DILTIAZEM HCL ER COATED BEADS 120 MG PO CP24: 120.0000 mg | ORAL_CAPSULE | Freq: Every day | ORAL | 3 refills | Status: AC

## 2023-12-10 NOTE — Patient Instructions (Signed)
 Medication Instructions:  Your physician has recommended you make the following change in your medication:  1) STOP taking metoprolol   2) START taking diltiazem  (Cardizem ) 120 mg once daily  *If you need a refill on your cardiac medications before your next appointment, please call your pharmacy*  Follow-Up: At [facility], you and your health needs are our priority.  As part of our continuing mission to provide you with exceptional heart care, our providers are all part of one team.  This team includes your primary Cardiologist (physician) and Advanced Practice Providers or APPs (Physician Assistants and Nurse Practitioners) who all work together to provide you with the care you need, when you need it.  Your next appointment:   3 months  Provider:   Daphne Barrack, NP

## 2023-12-11 DIAGNOSIS — E7849 Other hyperlipidemia: Secondary | ICD-10-CM | POA: Diagnosis not present

## 2023-12-11 DIAGNOSIS — E1142 Type 2 diabetes mellitus with diabetic polyneuropathy: Secondary | ICD-10-CM | POA: Diagnosis not present

## 2023-12-11 DIAGNOSIS — N1831 Chronic kidney disease, stage 3a: Secondary | ICD-10-CM | POA: Diagnosis not present

## 2023-12-17 DIAGNOSIS — N1831 Chronic kidney disease, stage 3a: Secondary | ICD-10-CM | POA: Diagnosis not present

## 2023-12-17 DIAGNOSIS — G4733 Obstructive sleep apnea (adult) (pediatric): Secondary | ICD-10-CM | POA: Diagnosis not present

## 2023-12-17 DIAGNOSIS — K580 Irritable bowel syndrome with diarrhea: Secondary | ICD-10-CM | POA: Diagnosis not present

## 2023-12-17 DIAGNOSIS — I10 Essential (primary) hypertension: Secondary | ICD-10-CM | POA: Diagnosis not present

## 2023-12-17 DIAGNOSIS — M1000 Idiopathic gout, unspecified site: Secondary | ICD-10-CM | POA: Diagnosis not present

## 2023-12-17 DIAGNOSIS — I482 Chronic atrial fibrillation, unspecified: Secondary | ICD-10-CM | POA: Diagnosis not present

## 2023-12-17 DIAGNOSIS — F331 Major depressive disorder, recurrent, moderate: Secondary | ICD-10-CM | POA: Diagnosis not present

## 2023-12-17 DIAGNOSIS — E1142 Type 2 diabetes mellitus with diabetic polyneuropathy: Secondary | ICD-10-CM | POA: Diagnosis not present

## 2024-02-12 ENCOUNTER — Other Ambulatory Visit: Payer: Self-pay | Admitting: Pulmonary Disease

## 2024-02-19 MED ORDER — FLECAINIDE ACETATE 150 MG PO TABS: 150.0000 mg | ORAL_TABLET | Freq: Two times a day (BID) | ORAL | 3 refills | Status: AC

## 2024-02-19 NOTE — Telephone Encounter (Signed)
 In accordance with refill protocols, please review and address the following requirements before this medication refill can be authorized:  Labs

## 2024-03-11 ENCOUNTER — Ambulatory Visit: Admitting: Pulmonary Disease
